# Patient Record
Sex: Female | Born: 1965 | Race: White | Hispanic: No | Marital: Married | Smoking: Never smoker
Health system: Southern US, Community
[De-identification: ages and names within clinical notes are randomized; demographics above are authoritative.]

## PROBLEM LIST (undated history)

## (undated) DIAGNOSIS — G4700 Insomnia, unspecified: Secondary | ICD-10-CM

## (undated) DIAGNOSIS — F909 Attention-deficit hyperactivity disorder, unspecified type: Secondary | ICD-10-CM

## (undated) DIAGNOSIS — M1990 Unspecified osteoarthritis, unspecified site: Secondary | ICD-10-CM

## (undated) DIAGNOSIS — F419 Anxiety disorder, unspecified: Secondary | ICD-10-CM

## (undated) DIAGNOSIS — F329 Major depressive disorder, single episode, unspecified: Secondary | ICD-10-CM

## (undated) DIAGNOSIS — M797 Fibromyalgia: Secondary | ICD-10-CM

## (undated) DIAGNOSIS — K635 Polyp of colon: Secondary | ICD-10-CM

## (undated) DIAGNOSIS — F4310 Post-traumatic stress disorder, unspecified: Secondary | ICD-10-CM

## (undated) DIAGNOSIS — K219 Gastro-esophageal reflux disease without esophagitis: Secondary | ICD-10-CM

## (undated) DIAGNOSIS — D649 Anemia, unspecified: Secondary | ICD-10-CM

## (undated) DIAGNOSIS — R7309 Other abnormal glucose: Secondary | ICD-10-CM

## (undated) DIAGNOSIS — F4321 Adjustment disorder with depressed mood: Secondary | ICD-10-CM

## (undated) HISTORY — DX: Other abnormal glucose: R73.09

## (undated) HISTORY — PX: UPPER GASTROINTESTINAL ENDOSCOPY: SHX188

## (undated) HISTORY — PX: DIAGNOSTIC LAPAROSCOPY: SUR761

## (undated) HISTORY — DX: Insomnia, unspecified: G47.00

## (undated) HISTORY — DX: Fibromyalgia: M79.7

## (undated) HISTORY — DX: Anemia, unspecified: D64.9

## (undated) HISTORY — PX: COLONOSCOPY: SHX174

## (undated) HISTORY — DX: Adjustment disorder with depressed mood: F43.21

## (undated) HISTORY — DX: Major depressive disorder, single episode, unspecified: F32.9

## (undated) HISTORY — PX: TMJ ARTHROPLASTY: SHX1066

## (undated) HISTORY — DX: Post-traumatic stress disorder, unspecified: F43.10

## (undated) HISTORY — DX: Polyp of colon: K63.5

## (undated) HISTORY — DX: Unspecified osteoarthritis, unspecified site: M19.90

## (undated) HISTORY — DX: Gastro-esophageal reflux disease without esophagitis: K21.9

## (undated) SURGERY — MANOMETRY, ESOPHAGUS
Anesthesia: LOCAL

---

## 2000-05-28 HISTORY — PX: ABDOMINAL HYSTERECTOMY: SHX81

## 2003-06-11 ENCOUNTER — Encounter: Admission: RE | Admit: 2003-06-11 | Discharge: 2003-06-11 | Payer: Self-pay | Admitting: Gastroenterology

## 2003-06-11 IMAGING — RF DG UGI W/ HIGH DENSITY W/KUB
15 series · 15 of 15 positions shown · non-contrast
Comparison: none

CLINICAL DATA: Abdominal pain, irritable bowel syndrome.   Previous hysterectomy and endometriosis.  Some nausea.
 UPPER GI, HIGH DENSITY, WITH KUB
 KUB showed considerable fecal material within the right, left, and sigmoid colon. No obstruction or perforation or mass is seen.  
 With the aid of fluoroscopic visualization, barium was seen to pass freely through the esophagus revealing a small hiatal hernia which showed neither obstruction to the passage of a 3 x 13 mm barium tablet nor significant gastroesophageal reflux during this study.
 Stomach appeared normal in size, contour, and mucosal pattern.  There were no ulcers, masses, or obstruction seen.   The pyloric channel, duodenal bulb, and C-loop appear normal. 
 IMPRESSION
 Small sliding hiatal hernia, otherwise normal upper GI series.

[Series 1: run · 1 of 1 slices shown (1 of 15)]
[im 1/1]
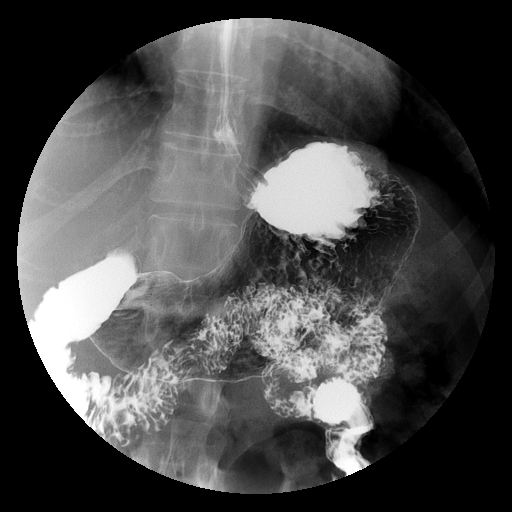

[Series 2: run · 1 of 1 slices shown (2 of 15)]
[im 1/1]
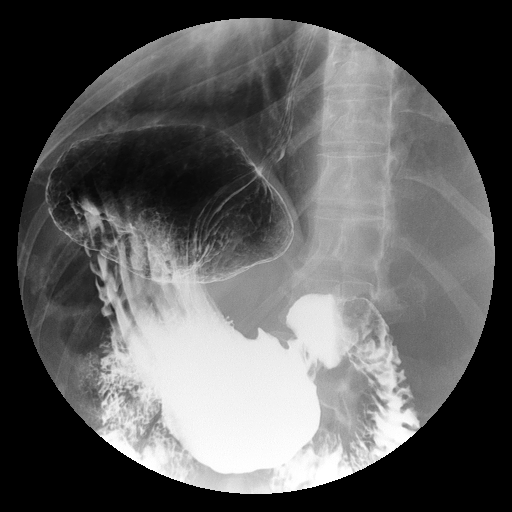

[Series 3: run · 1 of 1 slices shown (3 of 15)]
[im 1/1]
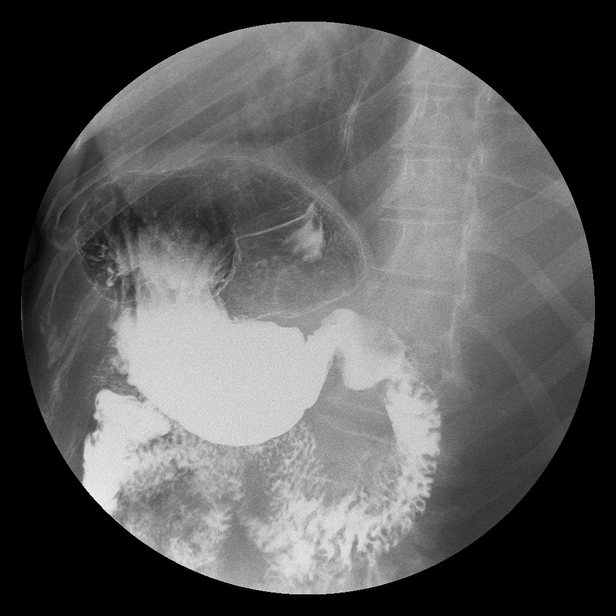

[Series 4: run · 1 of 1 slices shown (4 of 15)]
[im 1/1]
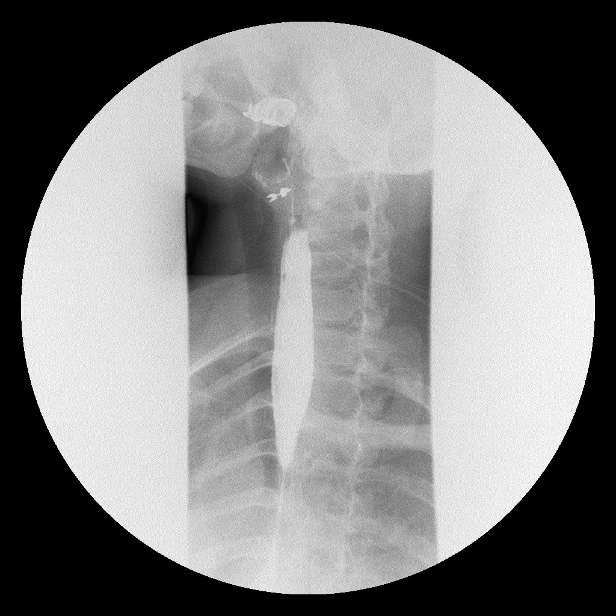

[Series 5: run · 1 of 1 slices shown (5 of 15)]
[im 1/1]
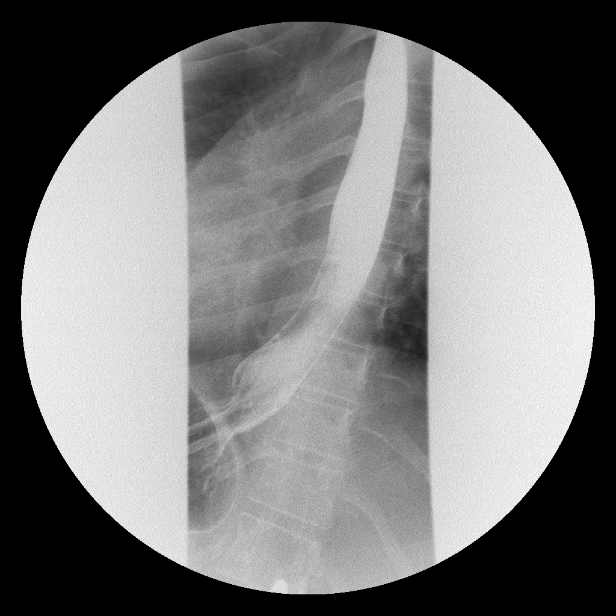

[Series 6: run · 1 of 1 slices shown (6 of 15)]
[im 1/1]
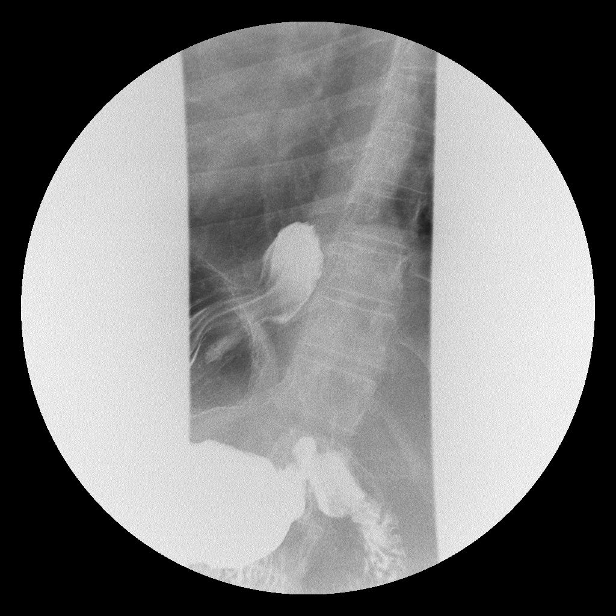

[Series 7: run · 1 of 1 slices shown (7 of 15)]
[im 1/1]
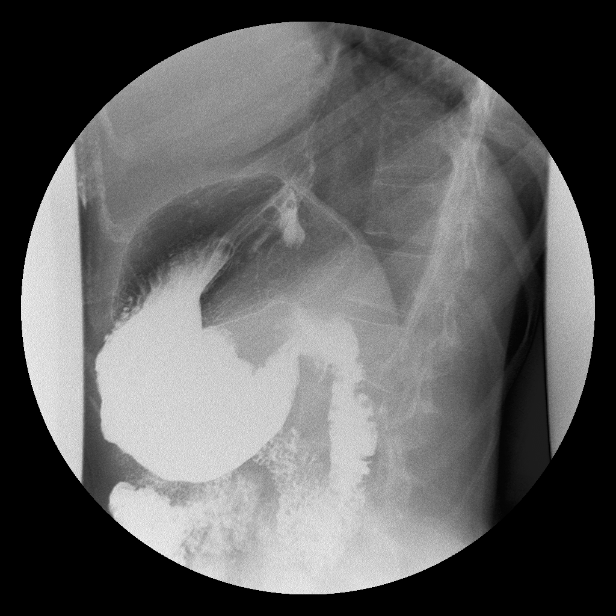

[Series 8: run · 1 of 1 slices shown (8 of 15)]
[im 1/1]
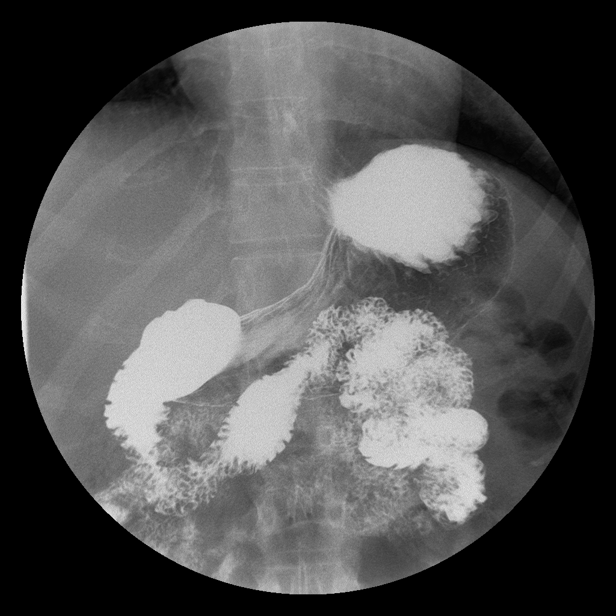

[Series 9: run · 1 of 1 slices shown (9 of 15)]
[im 1/1]
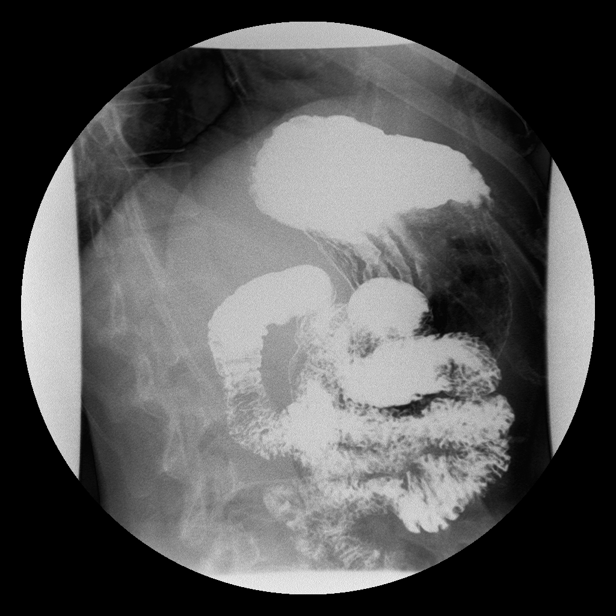

[Series 10: run · 1 of 1 slices shown (10 of 15)]
[im 1/1]
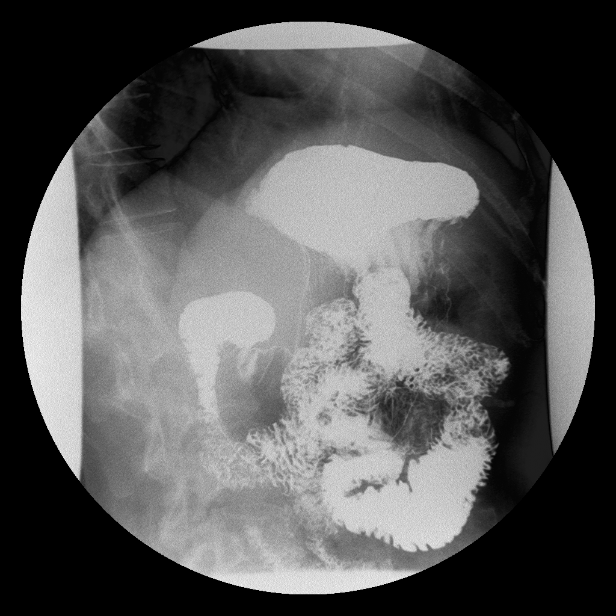

[Series 11: run · 1 of 1 slices shown (11 of 15)]
[im 1/1]
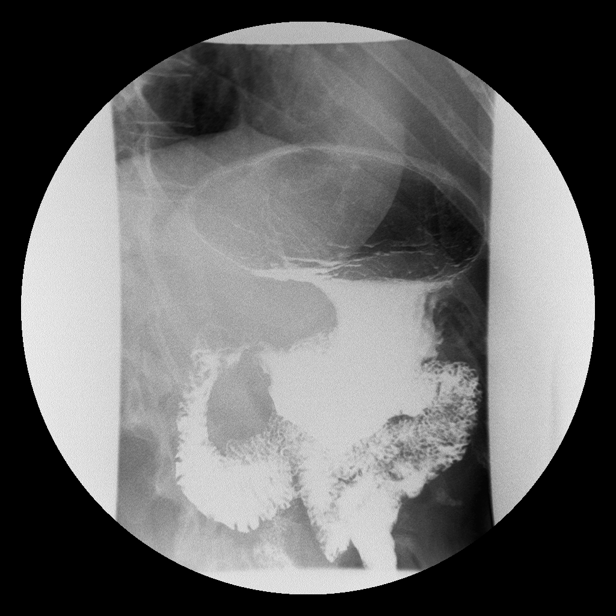

[Series 12: run · 1 of 1 slices shown (12 of 15)]
[im 1/1]
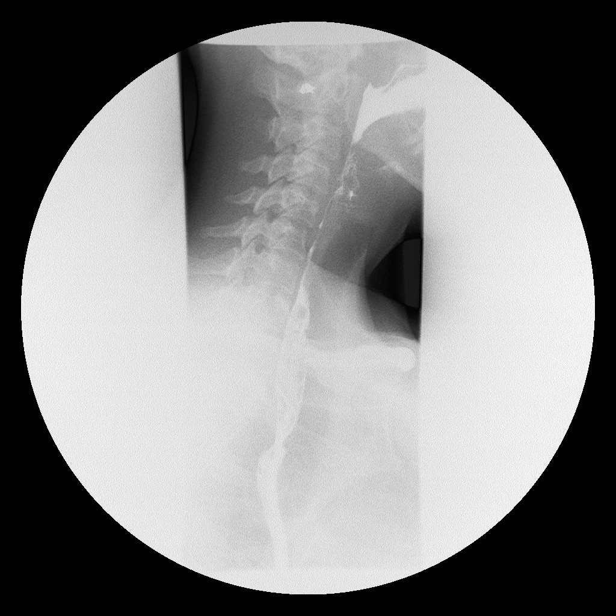

[Series 13: run · 1 of 1 slices shown (13 of 15)]
[im 1/1]
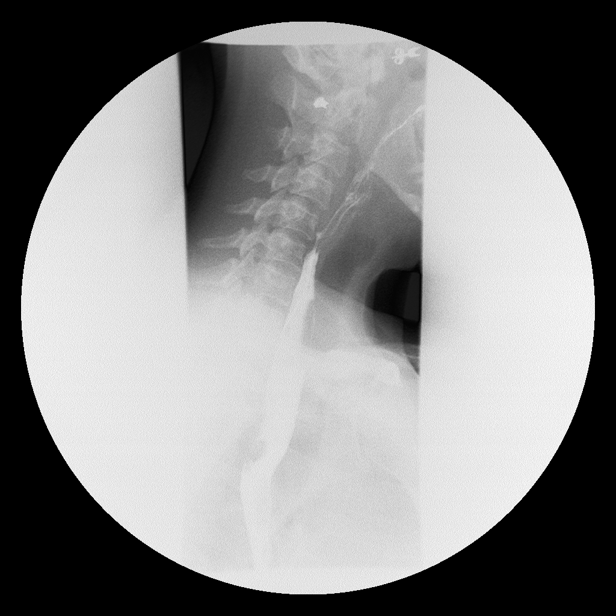

[Series 14: run · 1 of 1 slices shown (14 of 15)]
[im 1/1]
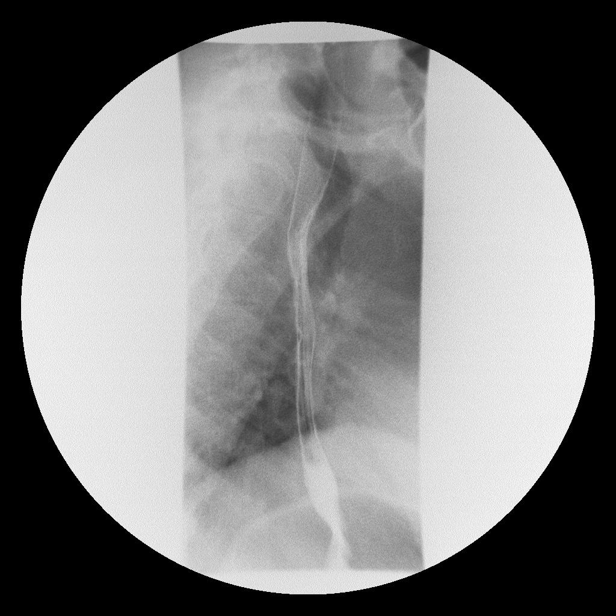

[Series 15: run · 1 of 1 slices shown (15 of 15)]
[im 1/1]
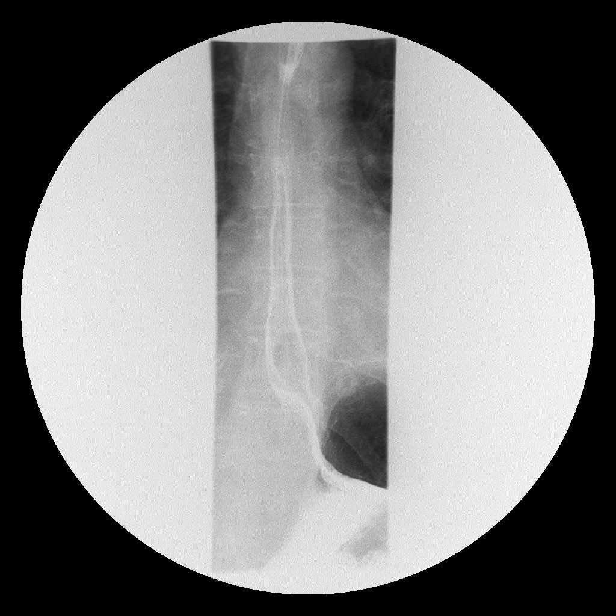

[15 of 15 positions shown; findings below may reference images not displayed]

## 2003-06-15 ENCOUNTER — Encounter: Admission: RE | Admit: 2003-06-15 | Discharge: 2003-06-15 | Payer: Self-pay | Admitting: Gastroenterology

## 2003-06-15 IMAGING — RF DG BE W/ CM - WO/W KUB
14 series · 14 of 14 positions shown · non-contrast
Comparison: none

CLINICAL DATA: The patient has abdominal pain.
 BARIUM ENEMA
 The preliminary KUB reveals retained barium in the colon secondary to previous GI series on 06/11/03.  No obstruction is noted to the retrograde flow of barium with the cecum being well filled without reflux of the distal small bowel.  There is retained feces throughout the colon, particularly in the sigmoid area as well as the right colon.  Polypoid defects however cannot be excluded.  No significant spasm or mucosal abnormalities are noted.  There is tortuosity particularly of the sigmoid colon.
 IMPRESSION
 There is retained feces throughout the colon.  No definite abnormality is noted however.

[Series 1: run · 1 of 1 slices shown (1 of 14)]
[im 1/1]
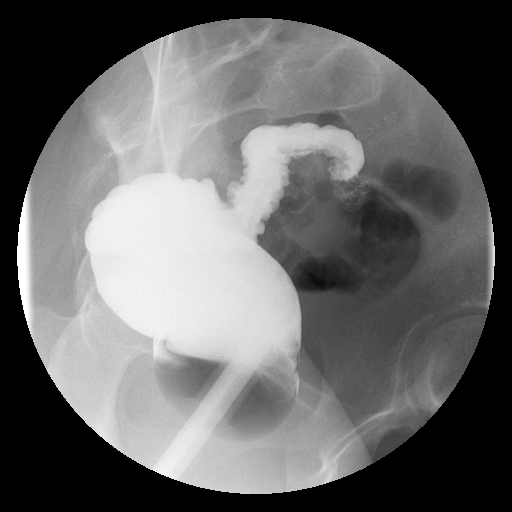

[Series 2: run · 1 of 1 slices shown (2 of 14)]
[im 1/1]
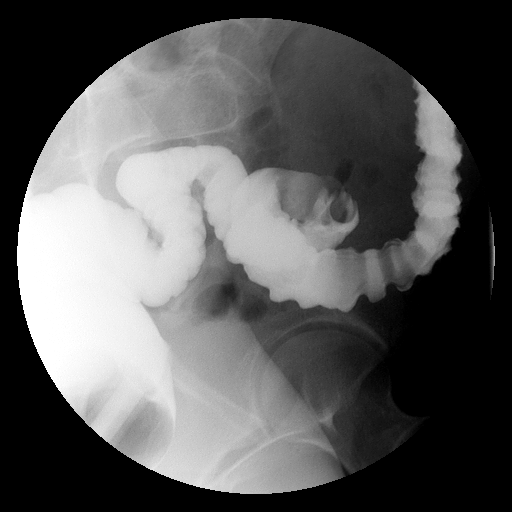

[Series 3: run · 1 of 1 slices shown (3 of 14)]
[im 1/1]
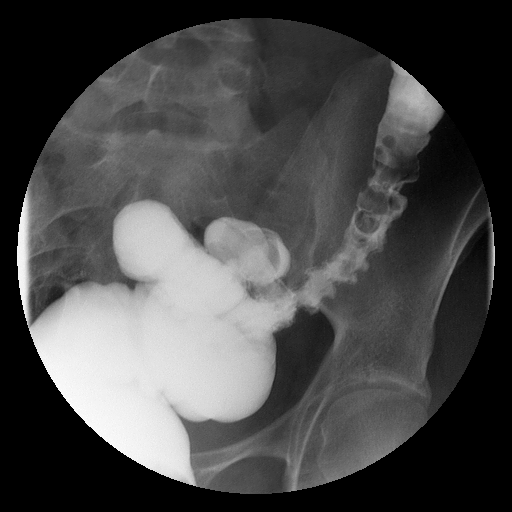

[Series 4: run · 1 of 1 slices shown (4 of 14)]
[im 1/1]
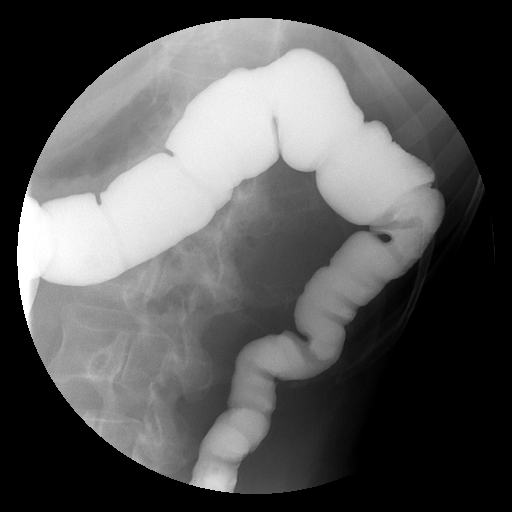

[Series 5: run · 1 of 1 slices shown (5 of 14)]
[im 1/1]
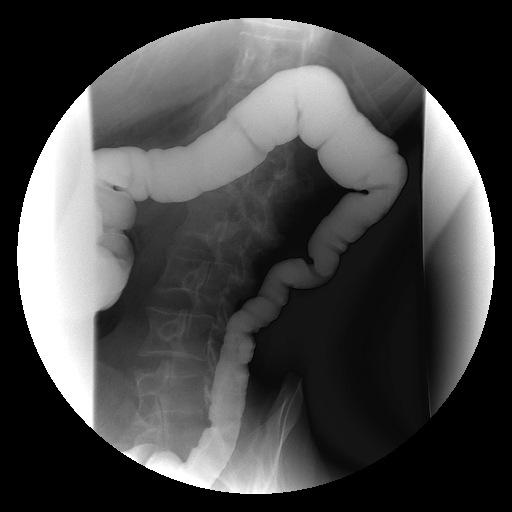

[Series 6: run · 1 of 1 slices shown (6 of 14)]
[im 1/1]
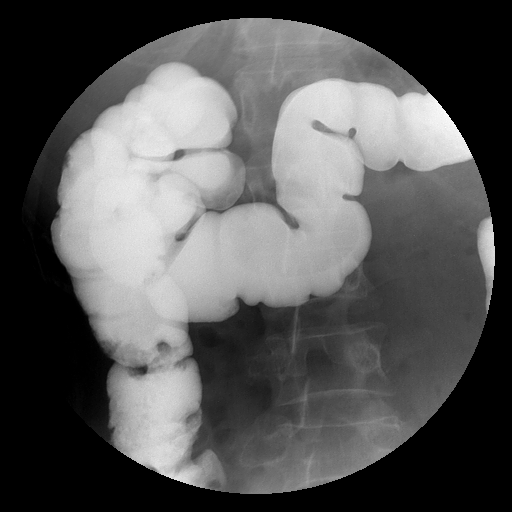

[Series 7: run · 1 of 1 slices shown (7 of 14)]
[im 1/1]
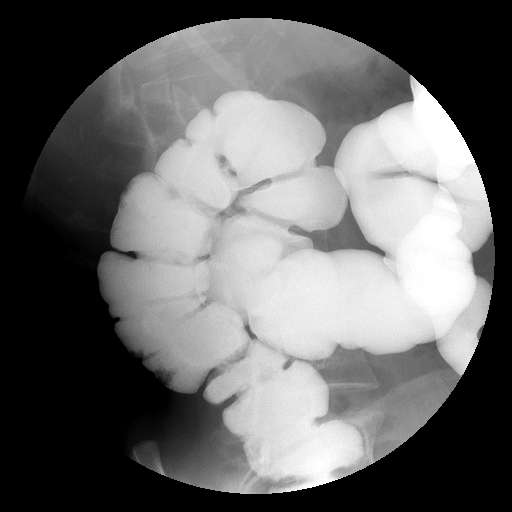

[Series 8: run · 1 of 1 slices shown (8 of 14)]
[im 1/1]
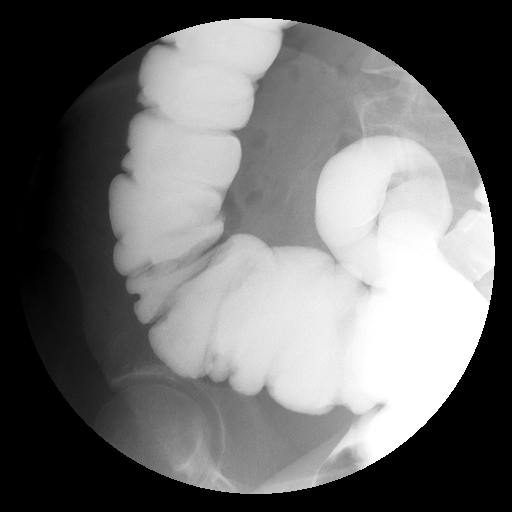

[Series 9: run · 1 of 1 slices shown (9 of 14)]
[im 1/1]
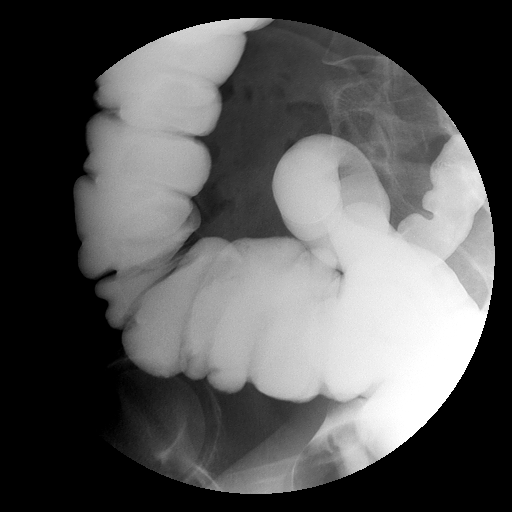

[Series 10: run · 1 of 1 slices shown (10 of 14)]
[im 1/1]
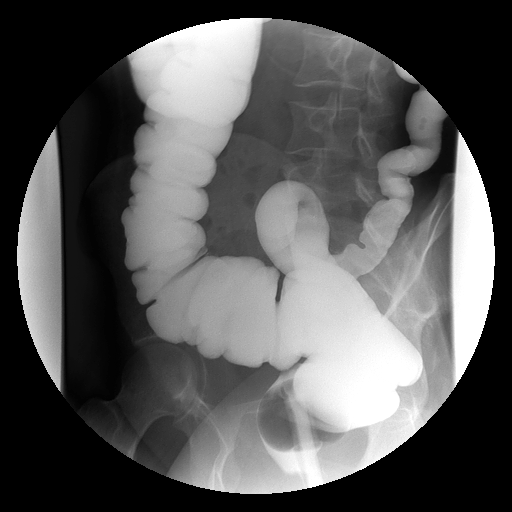

[Series 11: run · 1 of 1 slices shown (11 of 14)]
[im 1/1]
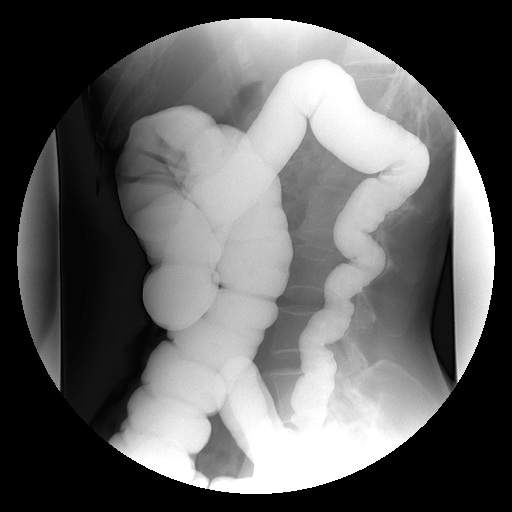

[Series 12: run · 1 of 1 slices shown (12 of 14)]
[im 1/1]
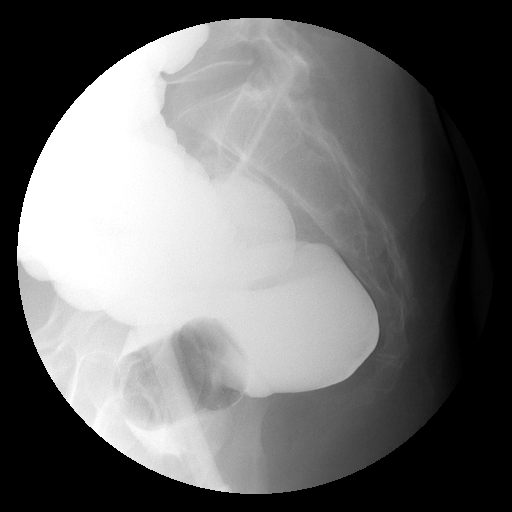

[Series 13: run · 1 of 1 slices shown (13 of 14)]
[im 1/1]
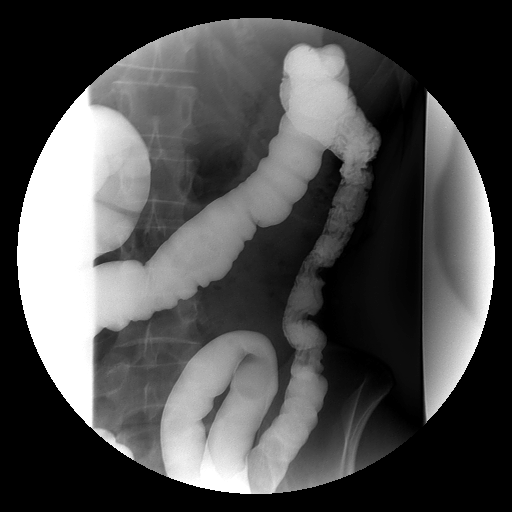

[Series 14: run · 1 of 1 slices shown (14 of 14)]
[im 1/1]
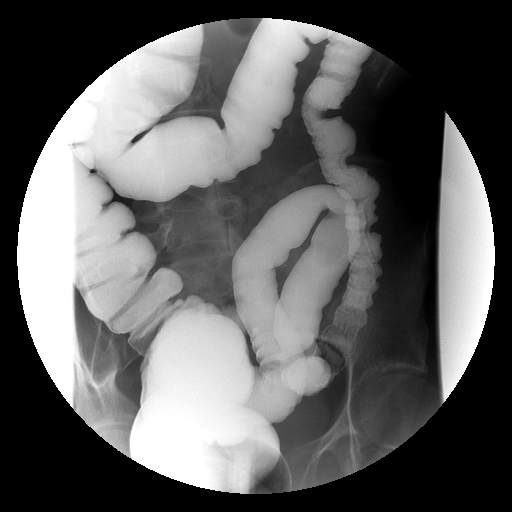

[14 of 14 positions shown; findings below may reference images not displayed]

## 2004-02-14 ENCOUNTER — Encounter: Admission: RE | Admit: 2004-02-14 | Discharge: 2004-05-14 | Payer: Self-pay | Admitting: Anesthesiology

## 2004-02-15 ENCOUNTER — Ambulatory Visit: Payer: Self-pay | Admitting: Anesthesiology

## 2004-03-28 ENCOUNTER — Ambulatory Visit: Payer: Self-pay | Admitting: Anesthesiology

## 2004-05-16 ENCOUNTER — Encounter: Admission: RE | Admit: 2004-05-16 | Discharge: 2004-06-16 | Payer: Self-pay | Admitting: Anesthesiology

## 2004-05-16 ENCOUNTER — Ambulatory Visit: Payer: Self-pay | Admitting: Anesthesiology

## 2004-06-09 ENCOUNTER — Ambulatory Visit: Payer: Self-pay | Admitting: Psychology

## 2004-06-09 ENCOUNTER — Encounter: Admission: RE | Admit: 2004-06-09 | Discharge: 2004-09-07 | Payer: Self-pay

## 2004-06-16 ENCOUNTER — Encounter: Admission: RE | Admit: 2004-06-16 | Discharge: 2004-09-14 | Payer: Self-pay | Admitting: Anesthesiology

## 2004-06-30 ENCOUNTER — Encounter: Admission: RE | Admit: 2004-06-30 | Discharge: 2004-09-28 | Payer: Self-pay | Admitting: Psychology

## 2004-07-17 ENCOUNTER — Ambulatory Visit: Payer: Self-pay | Admitting: Anesthesiology

## 2004-09-29 ENCOUNTER — Ambulatory Visit: Payer: Self-pay | Admitting: Psychology

## 2004-10-02 ENCOUNTER — Encounter: Admission: RE | Admit: 2004-10-02 | Discharge: 2004-12-31 | Payer: Self-pay | Admitting: Anesthesiology

## 2004-10-04 ENCOUNTER — Ambulatory Visit: Payer: Self-pay | Admitting: Physical Medicine and Rehabilitation

## 2004-10-09 ENCOUNTER — Encounter: Admission: RE | Admit: 2004-10-09 | Discharge: 2005-01-07 | Payer: Self-pay | Admitting: Psychology

## 2004-11-09 ENCOUNTER — Ambulatory Visit: Payer: Self-pay | Admitting: Physical Medicine and Rehabilitation

## 2004-11-30 ENCOUNTER — Ambulatory Visit: Payer: Self-pay | Admitting: Physical Medicine and Rehabilitation

## 2004-12-01 ENCOUNTER — Ambulatory Visit: Payer: Self-pay | Admitting: Psychology

## 2004-12-29 ENCOUNTER — Ambulatory Visit: Payer: Self-pay | Admitting: Psychology

## 2005-01-08 ENCOUNTER — Encounter: Admission: RE | Admit: 2005-01-08 | Discharge: 2005-04-26 | Payer: Self-pay | Admitting: Psychology

## 2005-01-26 ENCOUNTER — Ambulatory Visit: Payer: Self-pay | Admitting: Physical Medicine and Rehabilitation

## 2005-01-26 ENCOUNTER — Encounter
Admission: RE | Admit: 2005-01-26 | Discharge: 2005-04-26 | Payer: Self-pay | Admitting: Physical Medicine and Rehabilitation

## 2005-02-12 ENCOUNTER — Ambulatory Visit: Payer: Self-pay | Admitting: Psychology

## 2005-03-02 ENCOUNTER — Ambulatory Visit: Payer: Self-pay | Admitting: Psychology

## 2005-04-06 ENCOUNTER — Ambulatory Visit: Payer: Self-pay | Admitting: Physical Medicine and Rehabilitation

## 2005-04-09 ENCOUNTER — Ambulatory Visit: Payer: Self-pay | Admitting: Psychology

## 2005-05-04 ENCOUNTER — Encounter
Admission: RE | Admit: 2005-05-04 | Discharge: 2005-08-02 | Payer: Self-pay | Admitting: Physical Medicine and Rehabilitation

## 2005-05-06 ENCOUNTER — Encounter
Admission: RE | Admit: 2005-05-06 | Discharge: 2005-08-04 | Payer: Self-pay | Admitting: Physical Medicine and Rehabilitation

## 2005-06-13 ENCOUNTER — Other Ambulatory Visit: Admission: RE | Admit: 2005-06-13 | Discharge: 2005-06-13 | Payer: Self-pay | Admitting: Family Medicine

## 2005-06-15 ENCOUNTER — Ambulatory Visit: Payer: Self-pay | Admitting: Physical Medicine and Rehabilitation

## 2005-08-26 ENCOUNTER — Encounter: Admission: RE | Admit: 2005-08-26 | Discharge: 2005-11-24 | Payer: Self-pay | Admitting: Psychology

## 2005-11-23 ENCOUNTER — Ambulatory Visit: Payer: Self-pay | Admitting: Psychology

## 2006-03-08 ENCOUNTER — Encounter: Admission: RE | Admit: 2006-03-08 | Discharge: 2006-06-06 | Payer: Self-pay | Admitting: Psychology

## 2006-04-09 ENCOUNTER — Ambulatory Visit: Payer: Self-pay | Admitting: Psychology

## 2006-06-25 ENCOUNTER — Encounter: Admission: RE | Admit: 2006-06-25 | Discharge: 2006-09-23 | Payer: Self-pay | Admitting: Psychology

## 2006-06-25 ENCOUNTER — Ambulatory Visit: Payer: Self-pay | Admitting: Psychology

## 2006-08-02 ENCOUNTER — Ambulatory Visit: Payer: Self-pay | Admitting: Psychology

## 2007-07-25 ENCOUNTER — Encounter: Admission: RE | Admit: 2007-07-25 | Discharge: 2007-10-23 | Payer: Self-pay | Admitting: Psychology

## 2007-07-25 ENCOUNTER — Ambulatory Visit: Payer: Self-pay | Admitting: Psychology

## 2007-09-18 ENCOUNTER — Ambulatory Visit: Payer: Self-pay | Admitting: Psychology

## 2008-04-06 ENCOUNTER — Encounter: Payer: Self-pay | Admitting: Family Medicine

## 2008-09-02 ENCOUNTER — Ambulatory Visit: Payer: Self-pay | Admitting: Family Medicine

## 2008-09-02 DIAGNOSIS — F329 Major depressive disorder, single episode, unspecified: Secondary | ICD-10-CM

## 2008-09-02 DIAGNOSIS — F3289 Other specified depressive episodes: Secondary | ICD-10-CM

## 2008-09-02 DIAGNOSIS — G4700 Insomnia, unspecified: Secondary | ICD-10-CM | POA: Insufficient documentation

## 2008-09-02 DIAGNOSIS — G894 Chronic pain syndrome: Secondary | ICD-10-CM | POA: Insufficient documentation

## 2008-09-02 HISTORY — DX: Other specified depressive episodes: F32.89

## 2008-09-02 HISTORY — DX: Major depressive disorder, single episode, unspecified: F32.9

## 2008-09-02 HISTORY — DX: Insomnia, unspecified: G47.00

## 2008-09-27 ENCOUNTER — Telehealth: Payer: Self-pay | Admitting: Family Medicine

## 2008-09-30 ENCOUNTER — Telehealth: Payer: Self-pay | Admitting: Family Medicine

## 2008-10-28 ENCOUNTER — Telehealth: Payer: Self-pay | Admitting: Family Medicine

## 2008-11-25 ENCOUNTER — Telehealth: Payer: Self-pay | Admitting: Family Medicine

## 2008-12-30 ENCOUNTER — Ambulatory Visit: Payer: Self-pay | Admitting: Family Medicine

## 2009-01-25 ENCOUNTER — Telehealth: Payer: Self-pay | Admitting: Family Medicine

## 2009-02-22 ENCOUNTER — Telehealth: Payer: Self-pay | Admitting: Family Medicine

## 2009-02-25 ENCOUNTER — Ambulatory Visit: Payer: Self-pay | Admitting: Family Medicine

## 2009-02-25 LAB — CONVERTED CEMR LAB
ALT: 18 units/L (ref 0–35)
Basophils Relative: 0.5 % (ref 0.0–3.0)
Bilirubin, Direct: 0 mg/dL (ref 0.0–0.3)
Chloride: 110 meq/L (ref 96–112)
Direct LDL: 157.8 mg/dL
Eosinophils Relative: 1.1 % (ref 0.0–5.0)
Glucose, Urine, Semiquant: NEGATIVE
HCT: 36.1 % (ref 36.0–46.0)
Hemoglobin: 12.2 g/dL (ref 12.0–15.0)
Lymphs Abs: 1.3 10*3/uL (ref 0.7–4.0)
MCV: 90 fL (ref 78.0–100.0)
Monocytes Absolute: 0.3 10*3/uL (ref 0.1–1.0)
Potassium: 4.5 meq/L (ref 3.5–5.1)
Protein, U semiquant: NEGATIVE
RBC: 4.01 M/uL (ref 3.87–5.11)
TSH: 0.79 microintl units/mL (ref 0.35–5.50)
Total CHOL/HDL Ratio: 4
Total Protein: 7.2 g/dL (ref 6.0–8.3)
Urobilinogen, UA: 0.2
VLDL: 14 mg/dL (ref 0.0–40.0)
WBC: 4.1 10*3/uL — ABNORMAL LOW (ref 4.5–10.5)

## 2009-02-28 ENCOUNTER — Encounter: Payer: Self-pay | Admitting: Family Medicine

## 2009-03-01 ENCOUNTER — Encounter: Payer: Self-pay | Admitting: Family Medicine

## 2009-03-10 ENCOUNTER — Ambulatory Visit: Payer: Self-pay | Admitting: Family Medicine

## 2009-03-10 DIAGNOSIS — R7309 Other abnormal glucose: Secondary | ICD-10-CM

## 2009-03-10 HISTORY — DX: Other abnormal glucose: R73.09

## 2009-03-28 ENCOUNTER — Telehealth: Payer: Self-pay | Admitting: Family Medicine

## 2009-03-29 ENCOUNTER — Ambulatory Visit: Payer: Self-pay | Admitting: Family Medicine

## 2009-03-29 DIAGNOSIS — J069 Acute upper respiratory infection, unspecified: Secondary | ICD-10-CM | POA: Insufficient documentation

## 2009-04-08 ENCOUNTER — Encounter (INDEPENDENT_AMBULATORY_CARE_PROVIDER_SITE_OTHER): Payer: Self-pay | Admitting: *Deleted

## 2009-04-26 ENCOUNTER — Telehealth: Payer: Self-pay | Admitting: Family Medicine

## 2009-05-23 ENCOUNTER — Telehealth: Payer: Self-pay | Admitting: Family Medicine

## 2009-06-15 ENCOUNTER — Telehealth: Payer: Self-pay | Admitting: Family Medicine

## 2009-06-23 ENCOUNTER — Telehealth: Payer: Self-pay | Admitting: Family Medicine

## 2009-07-15 ENCOUNTER — Ambulatory Visit: Payer: Self-pay | Admitting: Family Medicine

## 2009-07-21 ENCOUNTER — Telehealth: Payer: Self-pay | Admitting: Family Medicine

## 2009-08-17 ENCOUNTER — Telehealth: Payer: Self-pay | Admitting: Family Medicine

## 2009-09-13 ENCOUNTER — Telehealth: Payer: Self-pay | Admitting: Family Medicine

## 2009-09-30 ENCOUNTER — Ambulatory Visit: Payer: Self-pay | Admitting: Family Medicine

## 2009-09-30 DIAGNOSIS — F4321 Adjustment disorder with depressed mood: Secondary | ICD-10-CM

## 2009-09-30 HISTORY — DX: Adjustment disorder with depressed mood: F43.21

## 2009-10-13 ENCOUNTER — Telehealth: Payer: Self-pay | Admitting: Family Medicine

## 2009-11-03 ENCOUNTER — Ambulatory Visit: Payer: Self-pay | Admitting: Family Medicine

## 2009-11-03 ENCOUNTER — Telehealth: Payer: Self-pay | Admitting: Family Medicine

## 2009-12-08 ENCOUNTER — Telehealth: Payer: Self-pay | Admitting: Internal Medicine

## 2009-12-28 ENCOUNTER — Telehealth: Payer: Self-pay | Admitting: Family Medicine

## 2010-01-06 ENCOUNTER — Ambulatory Visit: Payer: Self-pay | Admitting: Family Medicine

## 2010-01-09 ENCOUNTER — Telehealth: Payer: Self-pay | Admitting: Family Medicine

## 2010-02-03 ENCOUNTER — Ambulatory Visit: Payer: Self-pay | Admitting: Family Medicine

## 2010-03-01 ENCOUNTER — Telehealth: Payer: Self-pay | Admitting: Family Medicine

## 2010-03-28 ENCOUNTER — Ambulatory Visit: Payer: Self-pay | Admitting: Family Medicine

## 2010-03-28 DIAGNOSIS — E8941 Symptomatic postprocedural ovarian failure: Secondary | ICD-10-CM | POA: Insufficient documentation

## 2010-03-29 ENCOUNTER — Telehealth: Payer: Self-pay | Admitting: Family Medicine

## 2010-04-26 ENCOUNTER — Telehealth: Payer: Self-pay | Admitting: Family Medicine

## 2010-05-17 ENCOUNTER — Ambulatory Visit: Payer: Self-pay | Admitting: Family Medicine

## 2010-05-30 ENCOUNTER — Telehealth: Payer: Self-pay | Admitting: Family Medicine

## 2010-06-12 ENCOUNTER — Encounter: Payer: Self-pay | Admitting: Family Medicine

## 2010-06-14 ENCOUNTER — Encounter: Payer: Self-pay | Admitting: Family Medicine

## 2010-06-14 ENCOUNTER — Encounter
Admission: RE | Admit: 2010-06-14 | Discharge: 2010-06-14 | Payer: Self-pay | Source: Home / Self Care | Attending: Family Medicine | Admitting: Family Medicine

## 2010-06-18 ENCOUNTER — Encounter: Payer: Self-pay | Admitting: Family Medicine

## 2010-06-27 ENCOUNTER — Encounter: Payer: Self-pay | Admitting: Family Medicine

## 2010-06-27 NOTE — Assessment & Plan Note (Signed)
Summary: med check/follow up/cjr   Vital Signs:  Patient profile:   45 year old female Menstrual status:  hysterectomy Weight:      144 pounds Temp:     98.9 degrees F oral BP sitting:   120 / 80  (left arm) Cuff size:   regular  Vitals Entered By: [name] LPN (July 15, 2009 8:37 AM) CC: 3 month med check, lost all meds 5 days ago, Depression CBG Result 114   History of Present Illness: Followup for the following items.  Prediabetes. Home blood sugars frequent fasting around 140. Possibly some mild urine frequency. No fevers. No weight loss and in fact has gained a few pounds his last visit. Poor dietary compliance. Positive family history diabetes though not in any first degree relatives. Has been eating a lot of starchy foods.  History of depression. On Zoloft 100 mg b.i.d. Added Abilify several months ago. Initially had good improvement but feels more anxious and depressed at times now. Would like to consider titration of dosage. Poor sleep frequently. No suicidal ideation.  Chronic pain syndrome stable. Fibromyalgia. Has not tolerated many medications in the past. Takes oxycodone regularly and supplements with tramadol.  Depression History:      Positive alarm features for depression include insomnia and fatigue (loss of energy).  However, she denies significant weight loss, significant weight gain, and recurrent thoughts of death or suicide.        Allergies (verified): No Known Drug Allergies  Past History:  Past Medical History: Last updated: 09/02/2008 fibromyalgia colon polyps chronic pain syndrome Depression chronic insomnia  Past Surgical History: Last updated: 09/02/2008 Hysterectomy (TAH) TMJ surg X 2 Laparoscopy X 4 (1993, 1610,9604, 2000)  Social History: Last updated: 09/02/2008 Occupation: transportation logistics Married Never Smoked PMH-FH-SH reviewed for relevance  Review of Systems  The patient denies anorexia, fever, weight  loss, vision loss, chest pain, syncope, dyspnea on exertion, peripheral edema, prolonged cough, headaches, abdominal pain, and muscle weakness.    Physical Exam  General:  Well-developed,well-nourished,in no acute distress; alert,appropriate and cooperative throughout examination Ears:  External ear exam shows no significant lesions or deformities.  Otoscopic examination reveals clear canals, tympanic membranes are intact bilaterally without bulging, retraction, inflammation or discharge. Hearing is grossly normal bilaterally. Mouth:  Oral mucosa and oropharynx without lesions or exudates.  Teeth in good repair. Neck:  No deformities, masses, or tenderness noted. Lungs:  Normal respiratory effort, chest expands symmetrically. Lungs are clear to auscultation, no crackles or wheezes. Heart:  Normal rate and regular rhythm. S1 and S2 normal without gallop, murmur, click, rub or other extra sounds. Extremities:  No clubbing, cyanosis, edema, or deformity noted with normal full range of motion of all joints.   Neurologic:  alert & oriented X3, cranial nerves II-XII intact, and strength normal in all extremities.   Psych:  normally interactive, good eye contact, not anxious appearing, and not depressed appearing.     Impression & Recommendations:  Problem # 1:  PREDIABETES (ICD-790.29) Assessment Unchanged counseled regarding diabetes prevention.  Work on weight loss.  Problem # 2:  DEPRESSION (ICD-311) titrate Abilify to 5 mg by mouth once daily  Her updated medication list for this problem includes:    Zoloft 100 Mg Tabs (Sertraline hcl) ..... One by mouth bid  Problem # 3:  CHRONIC PAIN SYNDROME (ICD-338.4) Assessment: Unchanged  Complete Medication List: 1)  Zoloft 100 Mg Tabs (Sertraline hcl) .... One by mouth bid 2)  Ambien 10 Mg Tabs (Zolpidem  tartrate) .... 1/2-1/po q hs 3)  Oxycodone-acetaminophen 10-650 Mg Tabs (Oxycodone-acetaminophen) .... One by mouth qid as needed 4)   Tramadol Hcl 50 Mg Tabs (Tramadol hcl) .Marland Kitchen.. 1-2 tabs as needed 5)  Abilify 5 Mg Tabs (Aripiprazole) .... One by mouth once daily  Other Orders: Capillary Blood Glucose/CBG (66440)   Patient Instructions: 1)  Please schedule a follow-up appointment in 6 months .  2)  It is important that you exercise reguarly at least 20 minutes 5 times a week. If you develop chest pain, have severe difficulty breathing, or feel very tired, stop exercising immediately and seek medical attention.  3)  You need to lose weight. Consider a lower calorie diet and regular exercise.  4)  Check your blood sugars regularly. If your readings are usually above:140or below 70 you should contact our office.  Prescriptions: TRAMADOL HCL 50 MG TABS (TRAMADOL HCL) 1-2 tabs as needed  #120 x 1   Entered and Authorized by:   [name] MD   Signed by:   [name] MD on 07/15/2009   Method used:   [facility]  [address] [phone][facility] (retail)       [address] [place], [place]  [address]       Ph: [phone] or [phone]       Fax: [phone]   RxID:   [id number] ABILIFY 5 MG TABS (ARIPIPRAZOLE) one by mouth once daily  #30 x 5   Entered and Authorized by:   [name] MD   Signed by:   [name] MD on 07/15/2009   Method used:   [facility]        [facility]  [address] [phone][facility] (retail[facility]       [address] [place], [place]  [address]       Ph: [phone] or [phone]       Fax: [phone]   RxID:   [id number]

## 2010-06-27 NOTE — Miscellaneous (Signed)
Summary: Controlled Substances Contract  Controlled Substances Contract   Imported By: Maryln Gottron 03/30/2010 09:18:37  _____________________________________________________________________  External Attachment:    Type:   Image     Comment:   External Document

## 2010-06-27 NOTE — Progress Notes (Signed)
Summary: change of meds  Phone Note Call from Patient   Caller: Patient Call For: [name] MD Summary of Call: Lorazepam is making pt too tired, and making her feel worse.  Wants a different RX. [phone] [facility])  Initial call taken by: Lynann Beaver CMA,  January 09, 2010 12:39 PM  Follow-up for Phone Call        my suggestion is to leave off rather than add another med .  We already titrated her Abilify last visit and would avoid any other new meds. Other option is to reduce lorazepam to one half tablet/dose. Follow-up by: [name] MD,  January 09, 2010 1:07 PM  Additional Follow-up for Phone Call Additional follow up Details #1::        Notified pt. Additional Follow-up by: Lynann Beaver CMA,  January 09, 2010 1:17 PM

## 2010-06-27 NOTE — Progress Notes (Signed)
Summary:  Oxycodone refill request, last filled 4/19  Phone Note Refill Request Call back at Home Phone [phone] Message from:  Patient---live call  Refills Requested: Medication #1:  OXYCODONE-ACETAMINOPHEN 10-650 MG TABS one by mouth qid as needed call pt when ready.  Last filled 4/19  Initial call taken by: [name],  Oct 13, 2009 11:02 AM  Follow-up for Phone Call        will refill Follow-up by: [name] MD,  Oct 13, 2009 1:05 PM  Additional Follow-up for Phone Call Additional follow up Details #1::        Pt informed Additional Follow-up by: [name] LPN,  Oct 13, 2009 1:15 PM    Prescriptions: OXYCODONE-ACETAMINOPHEN 10-650 MG TABS (OXYCODONE-ACETAMINOPHEN) one by mouth qid as needed  #120 x 0   Entered and Authorized by:   [name] MD   Signed by:   [name] MD on 10/13/2009   Method used:   Print then Give to Patient   RxID:   [id number]

## 2010-06-27 NOTE — Progress Notes (Signed)
Summary: Address for Oxycodone refill request    Caller: Patient Call For: [name] MD Summary of Call: Address to mail Oxycodone refill when pt calls (out of town) [name] Saturday [address] [place], [place]  [address] Initial call taken by: [name] LPN,  November 03, 8313 9:29 AM  Follow-up for Phone Call        pt cb please mail rx  Follow-up by: [name],  November 07, 2009 4:06 PM  Additional Follow-up for Phone Call Additional follow up Details #1::        Pt called and said that she called on 11/07/09 to tell Dr. [name] to go ahead and mail script. Pt called to check on status.    Additional Follow-up by: [name],  November 10, 2009 1:38 PM    Additional Follow-up for Phone Call Additional follow up Details #2::    pt may have husband to pick script. call when ready.  Last filled 5/19 [name] LPN  November 10, 2009 2:34 PM      Follow-up by: [name],  November 10, 2009 2:13 PM  Additional Follow-up for Phone Call Additional follow up Details #3:: Details for Additional Follow-up Action Taken: can you please date it to be filled on the 19th, but husband needs to pick up today. going out of town. We can refill today.  Cannot "predate" but will refill 2 days early since she is out of town. [name] MD  November 11, 2009 8:13 AM  Additional Follow-up by: [name],  November 10, 2009 2:38 PM  Prescriptions: OXYCODONE-ACETAMINOPHEN 10-650 MG TABS (OXYCODONE-ACETAMINOPHEN) one by mouth qid as needed  #120 x 0   Entered and Authorized by:   [name] MD   Signed by:   [name] MD on 11/11/2009   Method used:   Print then Give to Patient   RxID:   [id number]  Pt informed Rx ready for pick-up.  Pt gave verbal permission for her husmband to pick-up as she is in [place].

## 2010-06-27 NOTE — Progress Notes (Signed)
Summary: Zolpidem refill request  Phone Note From Pharmacy   [facility]  [address] [phone]* Call For: [place]  Summary of Call[facility], take 1 to [facility]/2 at [facility], last filled 09-30-09 Initial call taken by: [name] LPN,  December 28, 2009 3:30 PM  Follow-up for Phone Call        OK to refill for 3 months. Follow-up by: [name] MD,  December 28, 2009 5:15 PM    Prescriptions: AMBIEN 10 MG TABS (ZOLPIDEM TARTRATE) 1/2-1/po q hs  #30 x 3   Entered by:   [name] LPN   Authorized by:   [name] MD   Signed by:   [name] LPN on 96/08/5407   Method used:   Telephoned to ...       [facility]  [address] [phone][facility] ([facility]ail)       [address]       [place], [place]  [address]       Ph: [phone] or [phone]       Fax: [phone]   RxID:   [id number]

## 2010-06-27 NOTE — Assessment & Plan Note (Signed)
Summary: due in family/njr   Vital Signs:  Patient profile:   45 year old female Menstrual status:  hysterectomy Weight:      137 pounds Temp:     99.2 degrees F oral BP sitting:   130 / 88  (left arm) Cuff size:   regular  Vitals Entered By: [name] LPN (Oct 01, 8467 10:11 AM) CC: father passed 4/31   History of Present Illness: Patient is here with grief reaction after her father passed away this past 2022/11/06. [place].  Father had chronic lung condition. Patient was very close to him. She's had extreme difficulty coping this past week. Poor sleep. She has run out of Ambien. She has long history of depression treated with Zoloft and Abilify but she ran out of Abilify recently. She denies suicidal ideation. She had great difficulty getting back into her routine. Frequent crying spells. Poor appetite.  Allergies (verified): No Known Drug Allergies  Past History:  Past Medical History: Last updated: 09/02/2008 fibromyalgia colon polyps chronic pain syndrome Depression chronic insomnia  Review of Systems      See HPI  Physical Exam  General:  patient crying during the interview poor eye contact but alert and cooperative. Lungs:  Normal respiratory effort, chest expands symmetrically. Lungs are clear to auscultation, no crackles or wheezes. Heart:  Normal rate and regular rhythm. S1 and S2 normal without gallop, murmur, click, rub or other extra sounds. Psych:  withdrawn, poor eye contact, tearful, and moderately anxious.     Impression & Recommendations:  Problem # 1:  GRIEF REACTION (ICD-309.0) we have strongly advised counseling particularly given her history of depression. We have set up for her to see Judithe Modest at 1 PM today. We'll refill her Ambien for p.r.n. use  Complete Medication List: 1)  Zoloft 100 Mg Tabs (Sertraline hcl) .... One by mouth bid 2)  Ambien 10 Mg Tabs (Zolpidem tartrate) .... 1/2-1/po q hs 3)   Oxycodone-acetaminophen 10-650 Mg Tabs (Oxycodone-acetaminophen) .... One by mouth qid as needed 4)  Tramadol Hcl 50 Mg Tabs (Tramadol hcl) .Marland Kitchen.. 1-2 tabs as needed 5)  Abilify 5 Mg Tabs (Aripiprazole) .... One by mouth once daily  Patient Instructions: 1)  See Judithe Modest at 1 pm today to initiate counseling. Prescriptions: AMBIEN 10 MG TABS (ZOLPIDEM TARTRATE) 1/2-1/po q hs  #30 x 1   Entered and Authorized by:   [name] MD   Signed by:   [name] MD on 09/30/2009   Method used:   Print then Give to Patient   RxID:   [id number]

## 2010-06-27 NOTE — Progress Notes (Signed)
Summary: Pt req script for Oxycodone, to pick-up Friday [facility]  Phone Note Refill Request Call back at Home Phone [phone] Message from:  Patient on March 01, 2010 2:42 PM  Refills Requested: Medication #1:  OXYCODONE-ACETAMINOPHEN 10-650 MG TABS one by mouth qid as needed   Dosage confirmed as above?Dosage Confirmed   Supply Requested: 1 month For pick up Friday afternoon 03/03/10.    Method Requested: Pick up at Office Initial call taken by: [name],  March 01, 2010 2:43 PM  Follow-up for Phone Call        last filled 02-03-10, #120 send to Dr Kirtland Bouchard on Friday  Additional Follow-up for Phone Call Additional follow up Details #1::        Pt called back to check on status of Oxycodone. Pt would like to pick this script up today asap.    Pls call when ready for pick up.  Additional Follow-up by: [name],  March 03, 2010 10:46 AM    Additional Follow-up for Phone Call Additional follow up Details #2::    done Follow-up by: Nelwyn Salisbury MD,  March 03, 2010 1:21 PM  Additional Follow-up for Phone Call Additional follow up Details #3:: Details for Additional Follow-up Action Taken: pt aware.  Additional Follow-up by: Pura Spice, RN,  March 03, 2010 1:56 PM  Prescriptions: OXYCODONE-ACETAMINOPHEN 10-650 MG TABS (OXYCODONE-ACETAMINOPHEN) one by mouth qid as needed  #120 x 0   Entered and Authorized by:   Nelwyn Salisbury MD   Signed by:   Nelwyn Salisbury MD on 03/03/2010   Method used:   Print then Give to Patient   RxID:   [id number]

## 2010-06-27 NOTE — Assessment & Plan Note (Signed)
Summary: fu on depression/njr   Vital Signs:  Patient profile:   45 year old female Menstrual status:  hysterectomy Weight:      146 pounds Temp:     98.7 degrees F oral BP sitting:   102 / 70  (left arm) Cuff size:   regular  Vitals Entered By: Duard Brady LPN (January 06, 2010 10:01 AM) CC: f/u on depression - c/o increased anxiety, Depressive symptoms Is Patient Diabetic? No   History of Present Illness:  Depressive symptoms      This is a 45 year old woman who presents with Depressive symptoms.  The patient reports depressed mood, loss of interest/pleasure, and insomnia, but denies significant weight loss and significant weight gain.  The patient also reports fatigue or loss of energy, diminished concentration, and thoughts of death.  The patient denies suicidal intent and suicidal plans.  The patient reports the following psychosocial stressors: recent death of a loved one.  The patient denies abnormally elevated mood, decreased need for sleep, increased talkativeness, flight of ideas, increased goal-directed activity, and inflated self-esteem/ grandiosity.    Long hx of depression.  On Zoloft max dose and prior agitation with Wellbutrin. Initially better with Abilify but not seeing much benefit past few weeks.  Compliant with meds. Abilify dose currently 5 mg.  Hx chronic pain syndrome treated with Oxycodone and fair pain control.  No side effects from medication.  Needs refills today. Preparing to leave out of state today for one week.  Pt also complaining of difficulty falling asleep.  Feels quite anxious at times.  Ambien does not seem to be consistently helping.  Preventive Screening-Counseling & Management  Alcohol-Tobacco     Smoking Status: never  Allergies (verified): No Known Drug Allergies  Past History:  Past Medical History: Last updated: 09/02/2008 fibromyalgia colon polyps chronic pain syndrome Depression chronic insomnia  Past Surgical  History: Last updated: 09/02/2008 Hysterectomy (TAH) TMJ surg X 2 Laparoscopy X 4 (1993, 1610,9604, 2000)  Family History: Last updated: 09/02/2008 Rheumatoid arthritis mother Family History Depression  Social History: Last updated: 11/03/2009 Occupation: transportation logistics Married Never Smoked Alcohol use-no  Risk Factors: Smoking Status: never (01/06/2010) PMH-FH-SH reviewed for relevance  Review of Systems  The patient denies anorexia, fever, weight loss, weight gain, chest pain, syncope, dyspnea on exertion, peripheral edema, prolonged cough, headaches, hemoptysis, abdominal pain, melena, hematochezia, and severe indigestion/heartburn.    Physical Exam  General:  Well-developed,well-nourished,in no acute distress; alert,appropriate and cooperative throughout examination Neck:  No deformities, masses, or tenderness noted. Lungs:  Normal respiratory effort, chest expands symmetrically. Lungs are clear to auscultation, no crackles or wheezes. Heart:  Normal rate and regular rhythm. S1 and S2 normal without gallop, murmur, click, rub or other extra sounds. Neurologic:  alert & oriented X3 and cranial nerves II-XII intact.   Psych:  normally interactive, good eye contact, and slightly anxious.     Impression & Recommendations:  Problem # 1:  DEPRESSION (ICD-311) Assessment Deteriorated titrate Abilify to 10mg  daily.  Consider referral to psychiatrist if still not controlled on this dose. Her updated medication list for this problem includes:    Zoloft 100 Mg Tabs (Sertraline hcl) ..... One by mouth bid    Lorazepam 0.5 Mg Tabs (Lorazepam) ..... One by mouth q 8 hours as needed severe anxiety  Problem # 2:  CHRONIC PAIN SYNDROME (ICD-338.4) refilled Oxycodone.  Problem # 3:  INSOMNIA, CHRONIC (ICD-307.42)  Complete Medication List: 1)  Zoloft 100 Mg Tabs (Sertraline hcl) .Marland KitchenMarland KitchenMarland Kitchen  One by mouth bid 2)  Ambien 10 Mg Tabs (Zolpidem tartrate) .... 1/2-1/po q hs 3)   Oxycodone-acetaminophen 10-650 Mg Tabs (Oxycodone-acetaminophen) .... One by mouth qid as needed 4)  Abilify 10 Mg Tabs (Aripiprazole) .... One by mouth once daily 5)  Lorazepam 0.5 Mg Tabs (Lorazepam) .... One by mouth q 8 hours as needed severe anxiety  Patient Instructions: 1)  Please schedule a follow-up appointment in 1 month.  Prescriptions: ABILIFY 10 MG TABS (ARIPIPRAZOLE) one by mouth once daily  #30 x 5   Entered and Authorized by:   [name] MD   Signed by:   [name] MD on 01/06/2010   Method used:   [facility]        [facility]  [address] [phone]* (retail)       [address] [place], [place]  [address]       Ph: [phone] or [phone]       Fax: [phone]   RxID:   [id number] LORAZEPAM 0.5 MG TABS (LORAZEPAM) one by mouth q 8 hours as needed severe anxiety  #30 x 0   Entered and Authorized by:   [name] MD   Signed by:   [name] MD on 01/06/2010   Method used:   Print then Give to Patient   RxID:   [id number] OXYCODONE-ACETAMINOPHEN 10-650 MG TABS (OXYCODONE-ACETAMINOPHEN) one by mouth qid as needed  #120 x 0   Entered and Authorized by:   [name] MD   Signed by:   [name] MD on 01/06/2010   Method used:   Print then Give to Patient   RxID:   [id number]

## 2010-06-27 NOTE — Assessment & Plan Note (Signed)
Summary: FOLLOW UP ON MEDS/CJR   Vital Signs:  Patient profile:   45 year old female Menstrual status:  hysterectomy Weight:      143 pounds Temp:     98.6 degrees F oral  Vitals Entered By: [name] LPN (November 03, 1608 8:25 AM)  CC: Follow-up on meds   History of Present Illness: Dealing with emotional issues of loss of Father one and one half months ago. Overall greatly improved.  Long hx of depression on Zoloft and Abilify. We referred for counseling but pt didn't go sec to cost. Sleep poor without Ambien.  Pain poorly controlled at times.  Hx chronic pain syndrome/fibromyalgia and came to me on opioids. We have explored and tried many alternatives in past without success-Tricyclics, Lyrica, Cymbalta, PT, massage therapy, Ultram, NSAIDS.  REmains on Oxycodone with no side effects.  Pain is diffuse and mostly upper back, upper extremities, neck.  Chronic insomnia.  Using Ambien with good relief.   Allergies (verified): No Known Drug Allergies  Past History:  Past Medical History: Last updated: 09/02/2008 fibromyalgia colon polyps chronic pain syndrome Depression chronic insomnia  Family History: Last updated: 09/02/2008 Rheumatoid arthritis mother Family History Depression  Social History: Last updated: 11/03/2009 Occupation: Dealer Married Never Smoked Alcohol use-no PMH-FH-SH reviewed for relevance  Social History: Occupation: Dealer Married Never Smoked Alcohol use-no  Review of Systems  The patient denies anorexia, fever, weight loss, chest pain, headaches, abdominal pain, and muscle weakness.    Physical Exam  General:  Well-developed,well-nourished,in no acute distress; alert,appropriate and cooperative throughout examination Neck:  No deformities, masses, or tenderness noted. Lungs:  Normal respiratory effort, chest expands symmetrically. Lungs are clear to auscultation, no crackles or wheezes. Heart:   Normal rate and regular rhythm. S1 and S2 normal without gallop, murmur, click, rub or other extra sounds. Msk:  multiple tender trigger points c/w fibromayalgia. Extremities:  no edema or clubbing. Psych:  normally interactive, good eye contact, and not anxious appearing.     Impression & Recommendations:  Problem # 1:  GRIEF REACTION (ICD-309.0) Assessment Improved discussed coping mechanisms.  Problem # 2:  CHRONIC PAIN SYNDROME (ICD-338.4) Assessment: Unchanged  Problem # 3:  INSOMNIA, CHRONIC (ICD-307.42) continues on Ambien.  Complete Medication List: 1)  Zoloft 100 Mg Tabs (Sertraline hcl) .... One by mouth bid 2)  Ambien 10 Mg Tabs (Zolpidem tartrate) .... 1/2-1/po q hs 3)  Oxycodone-acetaminophen 10-650 Mg Tabs (Oxycodone-acetaminophen) .... One by mouth qid as needed 4)  Abilify 5 Mg Tabs (Aripiprazole) .... One by mouth once daily  Patient Instructions: 1)  Please schedule a follow-up appointment in 4 months .  2)  Schedule your mammogram.  Prescriptions: ZOLOFT 100 MG TABS (SERTRALINE HCL) one by mouth bid  #60 x 5   Entered and Authorized by:   [name] MD   Signed by:   [name] MD on 11/03/2009   Method used:   [facility]  [address] [phone][facility]       [address] [place], [place]  [address]       Ph: [phone] or [phone]       Fax: [phone]   RxID:   [id number]
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
## 2010-06-27 NOTE — Progress Notes (Signed)
Summary: REQ FOR REFILL RX (OXYCODONE)  Phone Note Call from Patient   Caller: Patient  [phone] Reason for Call: Talk to Nurse, Talk to Doctor Summary of Call: Pt called to req that her Rx's for meds (OXYCODONE-ACETAMINOPHEN 10-650 MG TABS) be prepared for her to p/u early... Pt is going out of town because her Dad is sick (end stage lung disease) and has been admitted to the hospital...Marland KitchenMarland KitchenMarland Kitchen Pt can be reached at [phone] with any questions or concerns.  Initial call taken by: Debbra Riding,  September 13, 2009 10:21 AM  Follow-up for Phone Call        Pt called to ck on status of request... Pt was adv that req has been sent to physician for approval.  Follow-up by: Debbra Riding,  September 13, 2009 12:20 PM  Additional Follow-up for Phone Call Additional follow up Details #1::        can refill early this time but next refill should be > than 19th of month to account for early RF this month (ie next refill due 10-19-09). Additional Follow-up by: [name] MD,  September 13, 2009 1:00 PM    Additional Follow-up for Phone Call Additional follow up Details #2::    Pt informed ready for pick-up, also informed of 5/25 next refill per Dr [name] Follow-up by: [name] LPN,  September 13, 2009 1:04 PM  Prescriptions: OXYCODONE-ACETAMINOPHEN 10-650 MG TABS (OXYCODONE-ACETAMINOPHEN) one by mouth qid as needed  #120 x 0   Entered and Authorized by:   [name] MD   Signed by:   [name] MD on 09/13/2009   Method used:   Print then Give to Patient   RxID:   [id number]

## 2010-06-27 NOTE — Progress Notes (Signed)
Summary: Pt req script for Oxycodone 10-650mg   Phone Note Call from Patient Call back at Home Phone [phone]   Caller: Patient Call For: Toni Peat MD Summary of Call: Pt req script for Oxycodone 10-650mg . Please notify pt when ready for pick up.  Initial call taken by: [name],  July 21, 2009 8:44 AM  Follow-up for Phone Call        Last filled 1/27 #120, 0 RF OV 1/27 pt reported loosing all her medications. Follow-up by: [name] LPN,  July 21, 2009 8:49 AM  Additional Follow-up for Phone Call Additional follow up Details #1::        cannot refill early, due 2/27.  We can refill tomorrow since fill date will coincide with weekend Additional Follow-up by: Toni Peat MD,  July 21, 2009 11:47 AM    Additional Follow-up for Phone Call Additional follow up Details #2::    refill Follow-up by: Toni Peat MD,  July 22, 2009 10:33 AM  Additional Follow-up for Phone Call Additional follow up Details #3:: Details for Additional Follow-up Action Taken: Pt informed ready for pick-up Additional Follow-up by: [name] LPN,  July 22, 2009 11:41 AM  Prescriptions: OXYCODONE-ACETAMINOPHEN 10-650 MG TABS (OXYCODONE-ACETAMINOPHEN) one by mouth qid as needed  #120 x 0   Entered and Authorized by:   Toni Peat MD   Signed by:   Toni Peat MD on 07/22/2009   Method used:   Print then Give to Patient   RxID:   [id number]

## 2010-06-27 NOTE — Progress Notes (Signed)
Summary: refill Zoloft X 4 months  Phone Note Refill Request Message from:  Fax from Pharmacy  Refills Requested: Medication #1:  ZOLOFT 100 MG TABS one by mouth bid   Brand Name Necessary? [facility] [phone]     Fax---[phone]  Initial call taken by: [name],  June 15, 2009 8:57 AM    Prescriptions: ZOLOFT 100 MG TABS (SERTRALINE HCL) one by mouth bid  #60 x 4   Entered by:   [name] LPN   Authorized by:   [name] MD   Signed by:   [name] LPN on 13/24/4010   Method used:   [facility]        [facility]  [address] [phone][facility] ([facility]       [address] [place], [place]  [address]       Ph: [phone] or [phone]       Fax: [phone]   RxID:   [id number]

## 2010-06-27 NOTE — Assessment & Plan Note (Signed)
Summary: 1 month rov/njr/pt rsc/cjr----PT [facility] // RS   Vital Signs:  Patient profile:   45 year old female Menstrual status:  hysterectomy Weight:      146 pounds Temp:     98.6 degrees F oral BP sitting:   120 / 80  (left arm) Cuff size:   regular  Vitals Entered By: [name] LPN (February 03, 2010 8:20 AM) CC: 1 month follow-up CBG Result 119   History of Present Illness: Here for follow up depression. Still feels "down".   Low motivation. No improvement with increased dose of Abilify. No suicidal ideation Possibly more fatigued on Abilify.  Has been on many other antidepressants over the years. She does not recall any specific side effects with Wellbutrin.  Allergies (verified): No Known Drug Allergies  Past History:  Past Medical History: Last updated: 09/02/2008 fibromyalgia colon polyps chronic pain syndrome Depression chronic insomnia PMH reviewed for relevance  Physical Exam  General:  Well-developed,well-nourished,in no acute distress; alert,appropriate and cooperative throughout examination Lungs:  Normal respiratory effort, chest expands symmetrically. Lungs are clear to auscultation, no crackles or wheezes. Heart:  normal rate and regular rhythm.   Psych:  normally interactive, good eye contact, and depressed affect.     Impression & Recommendations:  Problem # 1:  DEPRESSION (ICD-311) Assessment Deteriorated Add low dose Wellbutrin.  She has appt with psychologist in one week.  Decrease Abilify to 5mg . Her updated medication list for this problem includes:    Zoloft 100 Mg Tabs (Sertraline hcl) ..... One by mouth bid    Lorazepam 0.5 Mg Tabs (Lorazepam) ..... One by mouth q 8 hours as needed severe anxiety    Bupropion Hcl 150 Mg Xr24h-tab (Bupropion hcl) ..... One by mouth once daily  Complete Medication List: 1)  Zoloft 100 Mg Tabs (Sertraline hcl) .... One by mouth bid 2)  Ambien 10 Mg Tabs (Zolpidem tartrate) .... 1/2-1/po q hs 3)   Oxycodone-acetaminophen 10-650 Mg Tabs (Oxycodone-acetaminophen) .... One by mouth qid as needed 4)  Abilify 5 Mg Tabs (Aripiprazole) .... One by mouth once daily 5)  Lorazepam 0.5 Mg Tabs (Lorazepam) .... One by mouth q 8 hours as needed severe anxiety 6)  Bupropion Hcl 150 Mg Xr24h-tab (Bupropion hcl) .... One by mouth once daily  Other Orders: Capillary Blood Glucose/CBG (60454)  Patient Instructions: 1)  Please schedule a follow-up appointment in 2 months.  Prescriptions: OXYCODONE-ACETAMINOPHEN 10-650 MG TABS (OXYCODONE-ACETAMINOPHEN) one by mouth qid as needed  #120 x 0   Entered and Authorized by:   [name] MD   Signed by:   [name] MD on 02/03/2010   Method used:   Print then Give to Patient   RxID:   [id number] BUPROPION HCL 150 MG XR24H-TAB (BUPROPION HCL) one by mouth once daily  #30 x 5   Entered and Authorized by:   [name] MD   Signed by:   [name] MD on 02/03/2010   Method used:   [facility]        [facility]  [address] [phone][facility] ([facility])       [address] [place], [place]  [address]       Ph: [phone] or [phone]       Fax: [phone]   RxID:   [id number] ABILIFY 5 MG TABS (ARIPIPRAZOLE) one by mouth once daily  #30 x 5   Entered and Authorized by:  [name] MD   Signed by:   [name] MD on 02/03/2010   Method used:   [facility]        [facility]  [address] [phone]* (retail)       [address] [place], [place]  [address]       Ph: [phone] or [phone]       Fax: [phone]   RxID:   [id number]

## 2010-06-27 NOTE — Progress Notes (Signed)
Summary: Pt req rx for Oxycodone 10-650mg  for pick up 08/19/09 in a.m.  Phone Note Call from Patient Call back at [facility] Phone [phone]   Caller: Patient Summary of Call: Pt req rx for Oxycodone 10-650mg . Pt wants to pick this up on Friday 08/19/09 in the morning.      Initial call taken by: [name],  August 17, 2009 9:04 AM  Follow-up for Phone Call        Pt due for refills 3/25 [facility] LPN  August 17, 2009 10:14 AM written Follow-up by: [name] MD,  August 18, 2009 8:27 AM  Additional Follow-up for Phone Call Additional follow up Details #1::        Pt informed Rx ready for pick-up on personally identified VM Additional Follow-up by: [name] LPN,  August 18, 2009 8:57 AM    Prescriptions: OXYCODONE-ACETAMINOPHEN 10-650 MG TABS (OXYCODONE-ACETAMINOPHEN) one by mouth qid as needed  #120 x 0   Entered and Authorized by:   [name] MD   Signed by:   [name] MD on 08/18/2009   Method used:   Print then Give to Patient   RxID:   [id number]

## 2010-06-27 NOTE — Progress Notes (Signed)
Summary: new rx Oxycodone  Phone Note Refill Request Call back at Home Phone [phone] Message from:  Patient  Refills Requested: Medication #1:  OXYCODONE-ACETAMINOPHEN 10-650 MG TABS one by mouth qid as needed new rx  Initial call taken by: [name],  April 26, 2010 3:07 PM  Follow-up for Phone Call        This is not due until after DEC 4.  Her last refill was Nov 4 and that was a few days early so she should have enough meds until I am back next week. Follow-up by: [name] MD,  April 26, 2010 5:33 PM  Additional Follow-up for Phone Call Additional follow up Details #1::        pt is aware doc will refill next week Additional Follow-up by: [name],  April 27, 2010 5:12 PM    Additional Follow-up for Phone Call Additional follow up Details #2::    Pt informed RX ready for pick-up Follow-up by: [name] LPN,  May 01, 2010 8:24 AM  New/Updated Medications: OXYCODONE-ACETAMINOPHEN 10-325 MG TABS (OXYCODONE-ACETAMINOPHEN) one by mouth q 6 hours Prescriptions: OXYCODONE-ACETAMINOPHEN 10-325 MG TABS (OXYCODONE-ACETAMINOPHEN) one by mouth q 6 hours  #120 x 0   Entered and Authorized by:   [name] MD   Signed by:   [name] MD on 05/01/2010   Method used:   Print then Give to Patient   RxID:   [id number]

## 2010-06-27 NOTE — Progress Notes (Signed)
Summary: REFILL REQUEST  Phone Note Refill Request Message from:  Patient on December 08, 2009 2:14 PM  Refills Requested: Medication #1:  OXYCODONE-ACETAMINOPHEN 10-650 MG TABS one by mouth qid as needed   Notes: Pt can be reached at [phone] when Rx is ready for p/u.    Initial call taken by: Debbra Riding,  December 08, 2009 2:15 PM    Prescriptions: OXYCODONE-ACETAMINOPHEN 10-650 MG TABS (OXYCODONE-ACETAMINOPHEN) one by mouth qid as needed  #120 x 0   Entered and Authorized by:   Gordy Savers  MD   Signed by:   Gordy Savers  MD on 12/08/2009   Method used:   Print then Give to Patient   RxID:   [id number]   Appended Document: REFILL REQUEST OK for p/u 12-09-2009  Appended Document: REFILL REQUEST pt aware rx ready for pick up in AM.   KIK

## 2010-06-27 NOTE — Progress Notes (Signed)
Summary: Pt req script for OXYCODONE-ACETAMINOPHEN 10-650 MG TABS  Phone Note Refill Request Call back at Home Phone [phone] Message from:  Patient  Refills Requested: Medication #1:  OXYCODONE-ACETAMINOPHEN 10-650 MG TABS one by mouth qid as needed   Dosage confirmed as above?Dosage Confirmed Pt req to pick up script on Friday 11/4/11by 10am if possible. Pls call when ready for pick up.    Method Requested: Pick up at Office Initial call taken by: [name],  March 29, 2010 4:07 PM  Follow-up for Phone Call        Last filled 10/7 will write Follow-up by: [name] MD,  March 31, 2010 8:58 AM  Additional Follow-up for Phone Call Additional follow up Details #1::        Pt aware Additional Follow-up by: [name] LPN,  March 31, 2010 9:20 AM    Prescriptions: OXYCODONE-ACETAMINOPHEN 10-650 MG TABS (OXYCODONE-ACETAMINOPHEN) one by mouth qid as needed  #120 x 0   Entered and Authorized by:   [name] MD   Signed by:   [name] MD on 03/31/2010   Method used:   Print then Give to Patient   RxID:   [id number]

## 2010-06-27 NOTE — Progress Notes (Signed)
Summary: Pt req refill of Percocet  Phone Note Call from Patient Call back at [facility] Phone [phone]   Caller: Patient Summary of Call: Pt req refill of Percocet. Pt doesnt need until tomorrow afternoon.   Initial call taken by: [name],  June 23, 2009 8:11 AM  Follow-up for Phone Call        Last written Rx filled 12/29, #120, 0 RF [name] LPN  June 23, 2009 8:32 AM   Additional Follow-up for Phone Call Additional follow up Details #1::        Will refill Additional Follow-up by: [name] MD,  June 23, 2009 12:35 PM    Additional Follow-up for Phone Call Additional follow up Details #2::    Informed pt on personally identified VM [name] LPN  June 23, 2009 1:25 PM   Prescriptions: OXYCODONE-ACETAMINOPHEN 10-650 MG TABS (OXYCODONE-ACETAMINOPHEN) one by mouth qid as needed  #120 x 0   Entered and Authorized by:   [name] MD   Signed by:   [name] MD on 06/23/2009   Method used:   Print then Give to Patient   RxID:   [id number]

## 2010-06-29 ENCOUNTER — Encounter: Payer: Self-pay | Admitting: Family Medicine

## 2010-06-29 ENCOUNTER — Ambulatory Visit (INDEPENDENT_AMBULATORY_CARE_PROVIDER_SITE_OTHER): Payer: PRIVATE HEALTH INSURANCE | Admitting: Family Medicine

## 2010-06-29 VITALS — BP 100/60 | HR 72 | Temp 98.3°F | Resp 14 | Ht 62.0 in | Wt 146.0 lb

## 2010-06-29 DIAGNOSIS — F32A Depression, unspecified: Secondary | ICD-10-CM

## 2010-06-29 DIAGNOSIS — M899 Disorder of bone, unspecified: Secondary | ICD-10-CM

## 2010-06-29 DIAGNOSIS — F329 Major depressive disorder, single episode, unspecified: Secondary | ICD-10-CM

## 2010-06-29 DIAGNOSIS — M8580 Other specified disorders of bone density and structure, unspecified site: Secondary | ICD-10-CM

## 2010-06-29 DIAGNOSIS — F3289 Other specified depressive episodes: Secondary | ICD-10-CM

## 2010-06-29 DIAGNOSIS — IMO0001 Reserved for inherently not codable concepts without codable children: Secondary | ICD-10-CM

## 2010-06-29 DIAGNOSIS — M797 Fibromyalgia: Secondary | ICD-10-CM

## 2010-06-29 MED ORDER — OXYCODONE-ACETAMINOPHEN 10-325 MG PO TABS: 1.0000 | ORAL_TABLET | Freq: Four times a day (QID) | ORAL | Status: DC | PRN

## 2010-06-29 MED ORDER — ESTROGENS CONJUGATED 0.45 MG PO TABS: 0.4500 mg | ORAL_TABLET | Freq: Every day | ORAL | Status: DC

## 2010-06-29 MED ORDER — SERTRALINE HCL 100 MG PO TABS: 100.0000 mg | ORAL_TABLET | Freq: Two times a day (BID) | ORAL | Status: DC

## 2010-06-29 NOTE — Assessment & Plan Note (Signed)
Summary: chest congestion/cough/njr/pt rsc/cjr   Vital Signs:  Patient profile:   45 year old female Menstrual status:  hysterectomy Weight:      142 pounds Temp:     98.1 degrees F oral BP sitting:   110 / 80  (left arm) Cuff size:   regular  Vitals Entered By: Duard Brady LPN (May 16, 2010 4:16 PM) CC: c/o chest congestion , non productive cough, achey Is Patient Diabetic? No   CC:  c/o chest congestion , non productive cough, and achey.  History of Present Illness: 45 year old patient who presents with a one week history of nonproductive refractory cough.  She has had some achiness and a sense of low-grade fever, but no documented fever or chills.  Her chief complaint is incessant cough.  It interferes with sleep. she has a history of chronic pain and is on chronic narcotics.  Preventive Screening-Counseling & Management  Alcohol-Tobacco     Smoking Status: never  Allergies (verified): No Known Drug Allergies  Past History:  Past Medical History: Reviewed history from 09/02/2008 and no changes required. fibromyalgia colon polyps chronic pain syndrome Depression chronic insomnia  Review of Systems       The patient complains of anorexia, fever, and prolonged cough.  The patient denies weight loss, weight gain, vision loss, decreased hearing, hoarseness, chest pain, syncope, dyspnea on exertion, peripheral edema, headaches, hemoptysis, abdominal pain, melena, hematochezia, severe indigestion/heartburn, hematuria, incontinence, genital sores, muscle weakness, suspicious skin lesions, transient blindness, difficulty walking, depression, unusual weight change, abnormal bleeding, enlarged lymph nodes, angioedema, and breast masses.    Physical Exam  General:  Well-developed,well-nourished,in no acute distress; alert,appropriate and cooperative throughout examination Head:  Normocephalic and atraumatic without obvious abnormalities. No apparent alopecia or  balding. Eyes:  No corneal or conjunctival inflammation noted. EOMI. Perrla. Funduscopic exam benign, without hemorrhages, exudates or papilledema. Vision grossly normal. Ears:  External ear exam shows no significant lesions or deformities.  Otoscopic examination reveals clear canals, tympanic membranes are intact bilaterally without bulging, retraction, inflammation or discharge. Hearing is grossly normal bilaterally. Nose:  External nasal examination shows no deformity or inflammation. Nasal mucosa are pink and moist without lesions or exudates. Mouth:  Oral mucosa and oropharynx without lesions or exudates.  Teeth in good repair. Neck:  No deformities, masses, or tenderness noted. Lungs:  Normal respiratory effort, chest expands symmetrically. Lungs are clear to auscultation, no crackles or wheezes. Heart:  Normal rate and regular rhythm. S1 and S2 normal without gallop, murmur, click, rub or other extra sounds.   Impression & Recommendations:  Problem # 1:  VIRAL URI (ICD-465.9)  Her updated medication list for this problem includes:    Benzonatate 200 Mg Caps (Benzonatate) ..... One every 8 hours for cough  Problem # 2:  CHRONIC PAIN SYNDROME (ICD-338.4)  Complete Medication List: 1)  Zoloft 100 Mg Tabs (Sertraline hcl) .... One by mouth bid 2)  Ambien 10 Mg Tabs (Zolpidem tartrate) .... 1/2-1/po q hs 3)  Oxycodone-acetaminophen 10-325 Mg Tabs (Oxycodone-acetaminophen) .... One by mouth q 6 hours 4)  Abilify 5 Mg Tabs (Aripiprazole) .... One by mouth once daily 5)  Lorazepam 0.5 Mg Tabs (Lorazepam) .... One by mouth q 8 hours as needed severe anxiety 6)  Bupropion Hcl 150 Mg Xr24h-tab (Bupropion hcl) .... One by mouth once daily 7)  Controlled Substances Contract Signed 03/28/2010  8)  Benzonatate 200 Mg Caps (Benzonatate) .... One every 8 hours for cough  Patient Instructions: 1)  Get plenty of  rest, drink lots of clear liquids, and use Tylenol or Ibuprofen for fever and comfort.  Return in 7-10 days if you're not better:sooner if you're feeling worse. Prescriptions: BENZONATATE 200 MG CAPS (BENZONATATE) one every 8 hours for cough  #21 x 0   Entered and Authorized by:   Gordy Savers  MD   Signed by:   Gordy Savers  MD on 05/16/2010   Method used:   [facility] to        [facility]  [address] [phone] (retail)       [address]       [place], [place]  [address]       Ph: [phone] or [phone]       Fax: [phone]   RxID:   [id number]    Orders Added: 1)  Est. Patient Level III [36644]

## 2010-06-29 NOTE — Progress Notes (Signed)
  Subjective:    Patient ID: Toni Collins, female    DOB: [birth year], 45 y.o.   MRN: [id number]  HPI Patient is seen for the following issues.  Recent DEXA scan which reveals T score of -2.4 lumbar spine. Patient has a history of adult fracture. Risk factors for osteoporosis include sedentary lifestyle, family history, and excessive caffeine use. Nonsmoker. Her major risk factor is total abdominal hysterectomy several years ago and she was only briefly on estrogen. Unclear why she was taken off this. She had no side effects. Recent mammogram unremarkable. She does not take calcium or vitamin D consistently.  Patient has history of fibromyalgia. Chronic pain syndrome. Medications reviewed. Compliant with all.  History of depression. Remains on sertraline and low-dose Abilify. No side effects other than low libido. Needs refill sertraline.    Review of Systems  Denies any appetite or weight changes. No recent headaches. Denies chest pain, dyspnea, abdominal pain, or change in urine or stool habits.     Objective:   Physical Exam  Patient is alert in no distress. HEENT exam returns are normal. Oropharynx is clear. Pupils equal round to light. Neck no thyromegaly or adenopathy Chest clear to auscultation Heart regular rhythm and rate with no murmur Extremities no edema. Neuro full-strength throughout. Cranial nerves II through XII normal Skin no rash                    Assessment & Plan:  #1  osteopenia. Risk factors include long history of estrogen deficiency , Sedentary lifestyle, family history, excessive caffeine use. Counseled regarding increased calcium and vitamin D as well as exercise. Long discussion regarding options. Start low-dose estrogen 0.45 mg one daily. Repeat DEXA scan 2 years #2 history of depression stable. No change in medications. #3 history of fibromyalgia and chronic pain syndrome stable. Refill chronic medication

## 2010-06-29 NOTE — Patient Instructions (Signed)
Osteoporosis   Bones can become stronger or weaker over time due to a number of things. With osteoporosis, bones can become so weak (brittle) that they can break easily. Osteoporosis means porous bones (sponge looking). Osteopenia is a less severe weakness of the bones, which places you at risk for osteoporosis. It is important to identify if you have osteoporosis or osteopenia. Breaking (fractures) bones (especially hip and spine) is a major cause of going to the hospital, loss of independence and death.   CAUSES There are a number of causes and risk factors:  Gender: women are at a higher risk for osteoporosis than men.  Age: bone formation slows down with age.  Ethnicity: for unclear reasons, Caucasian and Asian women are at higher risk for osteoporosis; Hispanic and African-American women are at increased, but lesser, risk.  Family history of osteoporosis can mean that you are at a higher risk for getting it.  History of bone fractures indicates you may be at higher risk of another.  Calcium is very important for bone health and strength. Not enough calcium in your diet increases your risk for osteoporosis. Vitamin D is important for calcium metabolism. You get vitamin D from sunlight, foods or supplements  Physical activity: bones get stronger with weight bearing exercise and weaker without use.  Smoking is associated with decreased bone strength.  Medications: cortisone medications, too much thyroid medication, some cancer and seizure medicines and others can weaken bones and cause osteoporosis.  Decreased body weight is associated with osteoporosis. The small amount of estrogen-type molecules produced in fat cells seems to protect the bones.  Menopausal decrease in the hormone estrogen can cause osteoporosis.  Low levels of the hormone testosterone can cause osteoporosis.  Some medical conditions can lead to osteoporosis (hyperthyroidism, hyperparathyroidism, B12 deficiency, for  example).   SYMPTOMS No symptoms are usually felt as bones weaken. The first symptoms are generally related to bone fractures. You may have silent, tiny bone fractures, especially in your spine. This can cause height loss and forward bending of the spine (kyphosis).   DIAGNOSIS You or your caregiver may suspect osteoporosis based on height loss and forward bending of the spine (kyphosis). Osteoporosis or osteopenia may be identified on an x-ray done for other reasons. A bone density measurement will likely be taken. Your bones are often measured at your lower spine or your hips. Measurement is done by a special x-ray called a DEXA scan, or sometimes by a specialized CT scan. Other tests may be done to find the cause of osteoporosis (for example, blood tests to measure calcium and Vitamin D), or to monitor treatment.   TREATMENT The goal of osteoporosis treatment is to prevent fractures. This is done through medical and nonmedical treatments. The treatment will slow the weakening of your bones and strengthen them where possible. Measures to decrease the likelihood of falling and fracturing a bone are also important.   Nonmedical  You should have enough calcium and Vitamin D in your diet.  Do not smoke. If you smoke, ask for help to stop.  Exercise, especially weight bearing exercise, helps strengthen bones. Strength and balance exercise (Tai Chi) helps to prevent falls.  There are a number of things you can do to prevent falls listed in the Home Care section below. Ask your caregiver for other advice.   Medical  You may need supplements if you are not getting enough calcium, Vitamin D and Vitamin B12 for example.  If you are female and menopausal, you  should discuss the option of estrogen replacement or estrogen-like medication with your caregiver.  Medicines can be taken by mouth or injection to help build bone strength. When taken by mouth, there are important directions that you need to  follow.   Calcitonin is a hormone made by the thyroid gland that can help build bone strength and decrease fracture risk in the spine. It can be taken by nasal spray or injection.  Parathyroid hormone can be injected to help build bone strength.  You need to continue to get enough calcium intake with any of these medications.   HOME CARE INSTRUCTIONS  Avoid falls. l If you are unsteady on your feet, use a cane or walk with someone's help. l Remove loose rugs or electrical cords. l Keep your home well lit at night. l Avoid icy streets and wet or waxed floors.  l Hold the railing when using stairs. l Watch out for your pets. l Install grab bars in your bathroom.  To pick up objects, bend at the knees. DO NOT bend with your back.  Do not smoke! If you smoke, ask for help to stop.  You should have adequate calcium and Vitamin D in your diet (speak with your caregiver about amounts).  Before exercising, ask your caregiver what exercises will be good for you.  Take any prescription medication exactly as directed.   Only take over-the-counter or prescription medicines for pain, discomfort or fever as directed by your caregiver. Sometimes stronger pain medication is prescribed. Use only as directed.   SEEK MEDICAL CARE IF:   You had a fracture and your pain is not well controlled.  You had a fracture and you are not able to return to your activities as expected.  You  injured yourself again.  You develop side effects from medications (especially stomach pain or swallowing trouble while on oral osteoporosis medication).  You develop new unexplained problems.    SEEK IMMEDIATE MEDICAL CARE IF:  You develop sudden, severe pain in your back.  You develop pain after an injury or fall.   Document Released: 02/21/2005  Document Re-Released: 10/31/2007 [facility] Patient Information 2011 [place], [place].

## 2010-06-29 NOTE — Progress Notes (Signed)
Summary: Rx Refill Oxycodone, last filled 05-01-10  Phone Note Refill Request Call back at Home Phone [phone] Message from:  Patient on May 30, 2010 10:02 AM  Refills Requested: Medication #1:  OXYCODONE-ACETAMINOPHEN 10-325 MG TABS one by mouth q 6 hours  Method Requested: Pick up at Office Initial call taken by: Trixie Dredge,  May 30, 2010 10:02 AM  Follow-up for Phone Call        Last filled #120 on 05/01/2010 [name] LPN  May 30, 2010 1:47 PM will refill Follow-up by: [name] MD,  May 31, 2010 8:03 AM  Additional Follow-up for Phone Call Additional follow up Details #1::        Pt aware Additional Follow-up by: [name] LPN,  May 31, 2010 10:04 AM    Prescriptions: OXYCODONE-ACETAMINOPHEN 10-325 MG TABS (OXYCODONE-ACETAMINOPHEN) one by mouth q 6 hours  #120 x 0   Entered and Authorized by:   [name] MD   Signed by:   [name] MD on 05/31/2010   Method used:   Print then Give to Patient   RxID:   [id number]

## 2010-06-30 ENCOUNTER — Ambulatory Visit: Payer: Self-pay | Admitting: Family Medicine

## 2010-07-26 ENCOUNTER — Telehealth: Payer: Self-pay | Admitting: Family Medicine

## 2010-07-26 DIAGNOSIS — G894 Chronic pain syndrome: Secondary | ICD-10-CM

## 2010-07-26 DIAGNOSIS — M797 Fibromyalgia: Secondary | ICD-10-CM

## 2010-07-26 NOTE — Telephone Encounter (Signed)
Pt called and is req script for Percocet 10 mg. Pt will pick up on Friday 07/27/09. Pls call when ready.

## 2010-07-28 MED ORDER — OXYCODONE-ACETAMINOPHEN 10-325 MG PO TABS: 1.0000 | ORAL_TABLET | Freq: Four times a day (QID) | ORAL | Status: DC | PRN

## 2010-07-28 NOTE — Telephone Encounter (Signed)
Will refill.

## 2010-07-28 NOTE — Telephone Encounter (Signed)
Pt informed Rx ready for pick up. 

## 2010-08-23 ENCOUNTER — Telehealth: Payer: Self-pay | Admitting: Family Medicine

## 2010-08-23 DIAGNOSIS — G894 Chronic pain syndrome: Secondary | ICD-10-CM

## 2010-08-23 NOTE — Telephone Encounter (Signed)
Pt requesting refill Oxycodone, last filled 3/2 #120, 0 refills. Would like to pick up Friday

## 2010-08-23 NOTE — Telephone Encounter (Signed)
Last filled 3/2 #120, refill 0

## 2010-08-23 NOTE — Telephone Encounter (Signed)
Pt called req script for Oxycodone 10-325 mg Pls call pt when ready for pick on Friday 08/25/10.

## 2010-08-24 NOTE — Telephone Encounter (Signed)
OK to refill Friday. 

## 2010-08-24 NOTE — Telephone Encounter (Signed)
Pt does not need until Friday this week

## 2010-08-25 MED ORDER — OXYCODONE-ACETAMINOPHEN 10-325 MG PO TABS: 1.0000 | ORAL_TABLET | Freq: Four times a day (QID) | ORAL | Status: DC | PRN

## 2010-08-25 NOTE — Telephone Encounter (Signed)
rx ready for pickup 

## 2010-09-13 ENCOUNTER — Other Ambulatory Visit: Payer: Self-pay | Admitting: *Deleted

## 2010-09-13 NOTE — Telephone Encounter (Signed)
Ok to refill for 3 months.  

## 2010-09-13 NOTE — Telephone Encounter (Signed)
Pharmacy requesting refill of zolpidem 10 mg.  Last filled 08/11/10

## 2010-09-14 MED ORDER — ZOLPIDEM TARTRATE 10 MG PO TABS: ORAL_TABLET | ORAL | Status: DC

## 2010-09-14 NOTE — Telephone Encounter (Signed)
[facility] re: status for pts Z[facility]pidem refill. Pharmacist has been given refill info noted below.

## 2010-09-20 ENCOUNTER — Telehealth: Payer: Self-pay | Admitting: Family Medicine

## 2010-09-20 NOTE — Telephone Encounter (Signed)
Pt called req script for Oxycodone 10-325 mg Pls call pt when ready for pick on Friday 09/22/10 afternoon. Pt leaving to go out of town.

## 2010-09-20 NOTE — Telephone Encounter (Signed)
No due until Monday (30th).

## 2010-09-20 NOTE — Telephone Encounter (Signed)
Last filled on 08-25-10 #120 with 0 refills

## 2010-09-22 ENCOUNTER — Telehealth: Payer: Self-pay | Admitting: Family Medicine

## 2010-09-22 NOTE — Telephone Encounter (Signed)
Error/ pt is aware of previous message/njr

## 2010-09-22 NOTE — Telephone Encounter (Signed)
Pt called to check on status of picking up script for Oxycodone today. Pt has been informed that Dr [name] denied req for early pick up. Not due until 09/25/10 on Monday.

## 2010-09-24 NOTE — Telephone Encounter (Signed)
May refill Oxycodone.

## 2010-09-25 ENCOUNTER — Other Ambulatory Visit: Payer: Self-pay | Admitting: Family Medicine

## 2010-09-25 DIAGNOSIS — G894 Chronic pain syndrome: Secondary | ICD-10-CM

## 2010-09-25 MED ORDER — OXYCODONE-ACETAMINOPHEN 10-325 MG PO TABS: 1.0000 | ORAL_TABLET | Freq: Four times a day (QID) | ORAL | Status: DC | PRN

## 2010-09-25 NOTE — Telephone Encounter (Signed)
Rx refilled as requested, will inform pt ready for pick-up

## 2010-09-25 NOTE — Telephone Encounter (Signed)
Will refill today and inform pt ready to pick-up

## 2010-10-11 ENCOUNTER — Ambulatory Visit (INDEPENDENT_AMBULATORY_CARE_PROVIDER_SITE_OTHER): Payer: PRIVATE HEALTH INSURANCE | Admitting: Family Medicine

## 2010-10-11 ENCOUNTER — Encounter: Payer: Self-pay | Admitting: Family Medicine

## 2010-10-11 VITALS — BP 110/74 | Temp 98.7°F | Wt 141.0 lb

## 2010-10-11 DIAGNOSIS — F419 Anxiety disorder, unspecified: Secondary | ICD-10-CM

## 2010-10-11 DIAGNOSIS — F329 Major depressive disorder, single episode, unspecified: Secondary | ICD-10-CM

## 2010-10-11 DIAGNOSIS — F3289 Other specified depressive episodes: Secondary | ICD-10-CM

## 2010-10-11 DIAGNOSIS — F411 Generalized anxiety disorder: Secondary | ICD-10-CM

## 2010-10-11 MED ORDER — LORAZEPAM 0.5 MG PO TABS: 0.5000 mg | ORAL_TABLET | Freq: Three times a day (TID) | ORAL | Status: DC | PRN

## 2010-10-11 NOTE — Patient Instructions (Signed)
Be in touch if you feel mood is worsening. Try to exercise consistently.

## 2010-10-11 NOTE — Progress Notes (Signed)
  Subjective:    Patient ID: Toni Collins, female    DOB: 1965-09-01, 45 y.o.   MRN: [id number]  HPI Patient is seen for followup. She has a long history of recurrent depression. She remains on Zoloft 100 mg daily and Abilify 5 mg at night. Her father died low over one year ago. She seemed to get through that okay. She's had about 4 days of increased anxiousness for no apparent reason. No specific stressors. Sleep generally okay. Has used lorazepam in the past for similar flare up which seemed to help. Denies any chest pains. No dyspnea. No suicidal thoughts.   Review of Systems  Constitutional: Positive for fatigue. Negative for fever.  Respiratory: Negative for shortness of breath.   Cardiovascular: Negative for chest pain.  Neurological: Negative for dizziness, syncope and headaches.  Psychiatric/Behavioral: Positive for sleep disturbance and dysphoric mood. Negative for suicidal ideas, confusion, self-injury and agitation. The patient is nervous/anxious.        Objective:   Physical Exam  Constitutional: She is oriented to person, place, and time. She appears well-developed and well-nourished.  Cardiovascular: Normal rate, regular rhythm and normal heart sounds.   No murmur heard. Pulmonary/Chest: Effort normal and breath sounds normal. No respiratory distress. She has no wheezes. She has no rales.  Musculoskeletal: She exhibits no edema.  Neurological: She is alert and oriented to person, place, and time. No cranial nerve deficit.          Assessment & Plan:  #1 recurrent depression Continue with Zoloft and Abilify. #2 nonspecific anxiety. Refill lorazepam for short-term use only 0.5 mg every 8 hours as needed. Talked about alternatives to treating anxiety with exercise and plenty of rest. Consider counseling if symptoms persist

## 2010-10-13 NOTE — Assessment & Plan Note (Signed)
1.  Toni Collins comes to the center of pain management today after a      phone call last week in which she had what sounds like a significant      level of anxiety after switching medications.  As we asked her to come      in and be evaluated, apparently there is more to this story.  She has      changed jobs, this is not working out.  She has had some anxiety at      home, although I perform a careful risk inventory and it does not sound      like there is any risk at home, or current risk in her current lifestyle      or situational conversion.  It sounds like she does have a pretty good      support system at home.  She brings her medication in and agrees for the      pharmacist to destroy this.  We give her a prescription for the      pharmacist to sign that this has happened, and I will bridge her with a      Roxicodone prescription until I can see her for the hypogastric plexus      block.  2.  She states no wish to harm self or others.  3.  She is very appropriate today, and it just sounds like she got a bit      overwhelmed with the number of changes in her life, but she is to let us      know if that becomes an issue, and not through her husband but to make      an appointment to come in and talk to [place], or report to the ER.  She does      understand that she does have a good support system at home, and Dr.      Leonides Cave will be helping from a psychological perspective. That is in the      works and __________  sister today to expedite that.   OBJECTIVE:  No significant change.   IMPRESSION:  1.  Interstitial cystitis, vulvodynia.  2.  Abdominal pain, unspecified.   PLAN:  1.  As outlined above, Roxicodone 50 tablets, monitor usage pattern.  She      will being her medications in at each visit.  She has been compliant.      We are very tentative at this point whether we are going to be moving      too far up the controlled substance ladder, but we will clearly base it   on legitimate medical needs and appropriate usage.  I think that she is      starting to understand that we are working for her in her best interest,      and finding continuity and a support structure here.  2.  Discharge instructions given.       HH/MedQ  D:  03/07/2004 11:58:53  T:  03/07/2004 16:07:28  Job #:  [id number]

## 2010-10-13 NOTE — Assessment & Plan Note (Signed)
She is a 45 year old married white female with two children who is also  working 45-50 hours a week.  She is back in today for a recheck and refill  of her medications.  She has approximately six pills left from her Percocet  of her last prescription, which was written for her on January 19, 2005.  She  was given enough to bridge her for today's appointment.   Her average pain is about a 7 on a scale of 10.  She has multiple areas of  pain complaints, including her shoulders, hands, feet and low back as well  as lower abdominal area.  At the last visit, I had ordered some blood tests,  an arthritis panel as well as a celiac sprue antibody evaluation.  Both of  these panels were negative for arthritis or celiac disease.   This was reviewed with her today.  She reports that her average pain is  about a 7 on a scale of 10.  Her sleep has overall been poor, but she  reports she has never been a good sleeper.  Pain is worse in the morning and  at evening, improved with rest and medication.  She is not really sure what  aggravates her pain.   Her functional status is actually very high.  She has two young children.  She is working 45-50 hours a week, doing a lot of driving.  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   REVIEW OF SYSTEMS:  Positive for depression, anxiety, dizziness, also  nausea, abdominal pain.   No changes in past medical, social or family history since last visit.   PHYSICAL EXAMINATION:  VITAL SIGNS:  Blood pressure 114/71, pulse 67,  respirations 16, 100% saturated on room air.  GENERAL:  She is a well-developed, thin adult female who does not appear in  any distress today.  She is oriented x3.  Affect bright, alert, cooperative  and pleasant.  MUSCULOSKELETAL/NEUROLOGIC:  Her gait is normal.  No guarding with palpation  of the lower abdomen.   IMPRESSION:  Abdominal pain for several decades, etiology not entirely  clear.   PLAN:  It has been awhile since she  had a Pap smear.  Would like her to go  ahead and do that.  May have her follow up with urology to rule out any kind  of interstitial cystitis.  As noted above, arthritis panel and celiac sprue  panel were negative.   Will refill her Percocet 10/650 mg one to one-half tablet p.o. q.i.d.  p.r.n., #100.  This will last her one month.           ______________________________  Brantley Stage, M.D.     DMK/MedQ  D:  01/31/2005 15:42:41  T:  02/01/2005 06:49:14  Job #:  [id number]
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
## 2010-10-13 NOTE — Assessment & Plan Note (Signed)
PATIENT:  Toni Collins   DATE OF BIRTH:  Aug 13, 1965   SURGEON:  Jewel Baize. Stevphen Rochester, M.D.   [facility].  I have  reviewed the Health and history form.  I reviewed the 14 point review of  systems.   1.  She has a good relationship with Dr. Leonides Cave which is helping her through      many troubles in life.  She states she does not wish to harm herself or      others.  I performed a risk inventory.  She has a safe environment and      she has no abuse at the current time.  She does understand how to      activate assistance if needed.  2.  She is appropriate to her medications.  I do not think a UDS is      necessary today.  Consider next visit.  I will go ahead and prescribe      her Percocet to last her for 2 months, 100 tablets, 1/2 to 1 at 10 mg      strength and slowly wean her away from controlled substances.  3.  Follow along expectantly.  Interventional procedure if warranted.   OBJECTIVE:  Modest suprapubic discomfort, soft, nontender abdomen.  She had  some flares, but seems to be only modestly painful today.  Some improvement  made.   IMPRESSION:  Interstitial cystitis, pelvic pain unspecified.   PLAN:  1.  Conservative management.  2.  Follow up in 2 months.  3.  Discharge instructions given.      HH/MedQ  D:  08/08/2004 09:52:58  T:  08/08/2004 10:09:16  Job #:  [id number]

## 2010-10-13 NOTE — Assessment & Plan Note (Signed)
[facility] and I  evaluated her.  I have reviewed the Health and history form. I reviewed the  14 point review of systems.   1.  Toni Collins is having adequate function and quality of life indices on      current medication regimen, decreasing her oxycodone.  I think her ideal      dosing is going to be somewhere around the 10 mg level, 3-4 times a day,      and we will prescribe 100 of these as we do have an appropriate pill      count today and she is appropriate.  We will be very watchful of these      medications.  She understands that she should brings these in.  She      brings these in today, and we reviewed the patient care agreement and      opioid contract.  2.  Do not believe further imaging or diagnostic studies are warranted.  3.  We may approach her with a hypogastric plexus block as this will be a      multi-modality approach to dealing with her pain.  Overall she is bright      and engaging today.  Continues to work, tolerating Lyrica which is      beneficial.  I think we are very stable.  I will wait a month to see how      we are doing.  4.  She is obtaining a divorce.  She is seeing a psychological care      Toni Collins.  I do not see any elements of depressive indices, suicidal      ideation, or other risk items, particularly at home.  We will maintain a      wide differential here.  She states no wish to harm self or others.   OBJECTIVE:  Abdomen is soft.  Tender suprapubic.  Myofascial pain to the  para-lumbar region and flank pain.  No new neurological findings motor,  sensory or reflexive.   IMPRESSION:  Interstitial cystitis.   PLAN:  1.  Conservative management.  2.  Follow up in one month.  3.  Discharge instructions given.  4.  Lifestyle enhancements reviewed.  5.  Review of medication.       HH/MedQ  D:  05/16/2004 11:54:31  T:  05/16/2004 16:20:45  Job #:  [id number]

## 2010-10-13 NOTE — Assessment & Plan Note (Signed)
DATE OF BIRTH:  Mar 29, 1966   MEDICAL RECORD NUMBER:  [id number]   [name] is a married 45 year old woman who is being seen in our pain  and rehabilitative clinic for chronic pain in multiple areas including  shoulders, knees. She also has complaints of swelling in her hands and her  feet, especially in the morning when she first wakes up. She also has  chronic abdominal pain.   Her average pain is about a 6 on a scale of 10. Sleep has been poor. Pain  improves with rest and medication. She is not really sure what makes it  worse. In general, she gets good relief with her current medications. She  basically only takes one-half to one Percocet four times a day.   She continues to stay quite functional. She works 50-60 hours a week. She  has started doing some working out in a gym. She does some abdominal  crunches and some squats, uses some light weights. She has no problems  climbing stairs. She does drive. 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   Denies suicidal ideation. Denies bowel or bladder problems. Does admit to  depression/anxiety.   No new changes in past medical history.   SOCIAL HISTORY:  Reports some recent use of alcohol, 25 ounces at night. She  reports 120 ounces of beer per week.   FAMILY HISTORY:  Noncontributory.   Last visit, she had a urine drug screen positive for alcohol, 28.7 mg/dcl,  and she was appropriate with the rest of her urine drug screen. It was  positive for hydromorphone and oxycodone - hydromorphone is a metabolite of  oxycodone.   Blood pressure is 115/74, pulse 61, respirations 16, 100% saturated on room  air. She is thin adult female. She does not appear in any distress. She is  oriented x3. Her affect is bright, alert, and she is cooperative and  pleasant. She is able to stand easily in the room. Her gait is normal,  normal heel-toe gait. Narrow base of support is noted. No balance issues  noted.  Reflexes symmetric, intact. Manual muscle testing nonfocal. She has  no obvious swelling in the fingers or the toes. She has no tenderness at  present with palpation of the hands or feet. However, she states that when  she does get discomfort and swelling in her hands, she feels it at the MCP  joints as well as the proximal interphalangeal joints both hands, and in the  feet she describes her pain into the met head area, not really distally into  the phalanges.   Again, no swelling noted in her extremities today, no abnormal  discoloration, no skin changes are noted.   IMPRESSION:  1.  Abdominal pain.  2.  Complaints of joint pain and swelling.   PLAN:  Recommend follow up with rheumatologist through primary care  physician. Will also recommend urologist, Dr. Marcelyn Bruins, to rule out any  kind of interstitial cystitis, given her known complaints of abdominal pain.  Will go ahead and refill her Percocet 10/650 one-half to one p.o. q.i.d.  p.r.n. abdominal pain, #100. Recommend she does not drink alcohol while on  Percocet. Will check random drug screen in the next month or two.  She states she understands. There are no barriers to communication today.  She states she will comply with my recommendations regarding alcohol use.  Will see her back then in a month.  ______________________________  Brantley Stage, M.D.     DMK/MedQ  D:  04/09/2005 13:22:02  T:  04/10/2005 08:49:27  Job #:  [id number]
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
## 2010-10-13 NOTE — Assessment & Plan Note (Signed)
Tasheena Wambolt comes to Pain Management today and I evaluated her Health  and history form. I reviewed the 14 point review of systems.   1.  Jamayah is compliant.  She brings her medications in.  She is eating      appropriately.  She is still having trouble with the pelvic area, and I      am going to work with the diagnosis now of Interstitial      cystitis/vulvodynia.  To this end, I carefully discussed risks,      complications, and options of moving forward with interventional      techniques as well as medication management.  I will go ahead and      proceed with hypogastric plexus block to minimize potential effects of      narcotic-based pain medication and improve function and quality of life      issues.  I reviewed this in detail with her.  2.  Do not feel further imaging and diagnostics warranted.  3.  This does not appear to be anything infectious, but she is familiar with      this.  I asked her to maintain contact with primary care.  4.  We will consider a series of hypogastric plexus blocks, based on need      and response.   OBJECTIVE:  Suprapubic discomfort, not significantly changed.  There is some  modest flank pain, mostly myofascial pain noted.  No new neurological  findings motor, sensory or reflexive.   IMPRESSION:  1.  Interstitial cystitis.  2.  Vulvodynia.   PLAN:  1.  Hypogastric plexus block.  2.  Will see her in followup.  3.  Discharge Instructions given.  4.  Lifestyle enhancements reviewed.       HH/MedQ  D:  02/29/2004 12:11:57  T:  02/29/2004 14:43:23  Job #:  [id number]

## 2010-10-13 NOTE — Assessment & Plan Note (Signed)
REASON FOR VISIT:  Ms. Dedeaux is a 45 year old, married, white female who  was seen for the first time by me on Oct 04, 2004.  She has a long history  of abdominal pain dating back to when she was approximately 45 years old.  She has seen multiple physicians over the years for her abdominal pain.  She  complains today not as much of the abdomen, however, she has some joint  complaints including MCP joint pain bilaterally and reported some swelling,  especially in the morning, wrist pain, shoulder pain and left knee pain.  Her average pain is about a 7/10 described as aching sometimes.  Sleep has  been poor.  Pain is particularly worse with a variety of activity and  improves with rest and medication.  Relief with current medication is  actually quite good.   She is able to be up walking basically as long as she wants to.  She reports  a couple hours at a time.  She can climb stairs and she drives.  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  Denies any  suicidal ideation.  Denies any problems controlling bowel or bladder.  She  does admit to diarrhea, abdominal pain and limb swelling, especially the  hands.   There are no new changes in past medical or social history.  Further review  of family history reveals a mother with arthritis and is status post several  joint replacements and has been on agents.  She has a brother who committed  suicide at age 37.  She has another brother age 79 who has a history of  depression and alcohol abuse.   PHYSICAL EXAMINATION:  GENERAL:  She is a well-developed, well-nourished  woman who does not appear any distress.  VITAL SIGNS:  Blood pressure 120/78, pulse 76, respirations 16, 100%  saturations on room air.  NEUROLOGIC:  She is oriented x3, pleasant.  Affect is bright.  Alert and  cooperative.  She is able to stand without any difficulty.  Her gait is  entirely normal.  She has excellent range in the lumbar spine  as well as in  the cervical spine.  Full shoulder range of motion.  Reflexes are symmetric  in the upper and lower extremities.  There is no abnormal tone.  Toes are  downgoing.  No clonus is noted.  She has full range of motion in the  shoulder joints bilaterally as well as elbows, hands and wrists.  No  swelling is noted.  No tenderness is noted in the PIP or DIP bilaterally.  She remarks that she may have some mild tenderness in her MCP joint on the  right, but not on the left.  Again, I appreciate no synovial thickening or  worsening of her tenderness with palpation.   IMPRESSION:  1.  Abdominal pain for several decades.  Etiology not entirely clear.  2.  Complaints of arthritic pain with family history of rheumatoid      arthritis.   PLAN:  Will obtain an arthritic panel on her today as well as a celiac sprue  panel for her chronic abdominal pain.  Also, given the fact that she has  immunologic disease in her family with her mother with rheumatoid arthritis,  checking celiac sprue panel is warranted.  I do not see that it has been  repeated in previous GI workup.   Next visit, may consider starting her on aerobic conditioning program  educating  her on body mechanics with posture and ergonomics.  May also  consider bone density.  She tells me she is anemic.  Maybe this is  consistent with a malabsorption type syndrome.  Will refill her medications  today with Percocet 10/650 1/2 tablet to 1 tablet p.o. q.i.d. p.r.n. pain,  #100.  She has been appropriate with the use of this medication.  May also  at some point consider Lidoderm and Lyrica with her.       DMK/MedQ  D:  12/01/2004 14:14:02  T:  12/01/2004 15:50:55  Job #:  [id number]

## 2010-10-13 NOTE — Assessment & Plan Note (Signed)
PATIENT:  Toni Collins.   MEDICAL RECORD NUMBER:  [id number].   DATE OF BIRTH:  Nov 02, 1965.   SURGEON:  Jewel Baize. Stevphen Rochester, M.D.   [facility] our  request.   1.  She is ahead by 20 tablets and I counsel.  I do think legitimate medical      need is met with these controlled substances and I review the opioid      policy and opioid consent.  I review risks, complications and options of      these medicines and expectations as to compliance.   1.  I am going to increase her analgesic capacity to q.i.d. dosing, but      being mindful that we are going to do what we can to minimize escalation      of controlled substances.  She cannot afford the __________.  That may      be a reason why she required more analgesics, so we will sample her with      the __________.   1.  She reveals that she is starting to have troubles at home with her      husband.  Another a rationale for using the psychiatric services is to      help with her troubles in this regard.  She has not made contact due to      financial reasons, but agrees to do so.  She has some savings and      although this is not ideal, this is going to be a well rounded and      mandatory approach to dealing with her pain, particularly for successful      arena to return to work and productive member of society.   1.  Consider interventional procedures.  This would help [place] further decrease      controlled substances.  States no wish to harm self or others.      Chaperoned environment, at no time physician left unattended.   OBJECTIVE:  No significant change.   IMPRESSION:  1.  Interstitial cystitis.  2.  Vulvodynia.   PLAN:  1.  Conservative management.  I will go ahead and give her enough analgesic      capacity for the next few weeks as she establishes contact with the      psychiatric care Toni Collins.  Cautions to this medication to driving,      potential  habituating nature and making important cognitive decisions.  2.  __________ sampled.  3.  Follow up with [place] is discussed.       HH/MedQ  D:  04/11/2004 10:52:59  T:  04/11/2004 12:47:22  Job #:  [id number]

## 2010-10-13 NOTE — Assessment & Plan Note (Signed)
MEDICAL RECORD NUMBER:  [id number].   Toni Collins is back in for a brief recheck. She continues to have multiple  pain complaints including chronic abdominal pain, etiology not entirely  clear. She had some new right lateral hip pain. 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   She reports her average pain is about an 8 on a scale of 10. Sleep is poor.  She is unsure what makes her pain worse. Does improve with rest and  medications. She is able to climb stairs. She is drives. She is working 40  hours a week. No changes in health and history form on review of systems. No  changes in past medical, social or family history.   PHYSICAL EXAMINATION:  VITAL SIGNS:  Blood pressure today is 110/70, pulse  62, respirations 16, 100% saturated on room air.  GENERAL:  She is a well-developed, well-nourished woman. She does not appear  in any distress in our office today. Her affect is alert, cooperative and  pleasant. She is able to stand easily and independently after being seated.  Gait is normal.  ABDOMEN:  Abdomen is briefly examined with gentle palpation. She reports  tenderness in the abdomen. There is no guarding noted.  MUSCULOSKELETAL:  The right bilateral hips are evaluated today. She has  tenderness with palpation over the trochanters bilaterally and down the  iliotibial band bilaterally; however, worse on the right than on the left.  Otherwise, neurologically intact. No swelling is noted in the hands today at  the MCP, distal or proximal interphalangeal joints as well. No changes in  nail beds. The hands are warm without any cyanosis or erythema.   IMPRESSION:  Multiple pain complaints. Number one, abdominal pain, etiology  not really clear. Would like to complete the work up including GI work up,  urologic work up,  gynecologic work up to determine etiology for this. She  continues to have complaints of joint pain and swelling although this has  not been documented in the office. Regarding the swelling of the hands, she  does complain of pain and stiffness, worse in the morning. We will have her  continue to follow up with urology and wait for notes regarding this workup.  Will have her followed up in the physical therapy program for muscle  management, pain program, education and body mechanics, posture. Will have  them address the iliotibial band and right trochanter muscle imbalance about  the hip, have some ultrasound over the IT band as well. Regarding TMJ, we  will have her follow up with the orthodontist or possibly oral surgeon. I am  not sure if we will continue on oxycodone. I do not have a clear idea of  what the abdominal pain is that we are treating here. We need to pursue work  up regarding this. We will wean her down from Percocet. We will give her  10/650 one p.o. b.i.d. #60 and will start her on some ibuprofen in the  morning 600 mg 1 p.o. q.a.m. with food #30 given. We will see her back in a  month and awake further work up from various specialties to help [place] out in  determining etiology of this abdominal pain.           ______________________________  Brantley Stage, M.D.  DMK/MedQ  D:  05/07/2005 12:35:50  T:  05/07/2005 14:35:07  Job #:  [id number]

## 2010-10-13 NOTE — Assessment & Plan Note (Signed)
HISTORY OF PRESENT ILLNESS:  [facility].  I evaluated her history for 14 point review of systems.  #1 - Toni Collins comes in today relating to [place] that she has not had the  opportunity to meet up with Dr. Leonides Cave.  By her own words, she does think  this would be very beneficial, and again, we will go ahead and try to  facilitate this.  We have a different understanding from Dr. Maxwell Marion office  than the patient relates, as they stated they have been trying to get hold  of her, and she states that they have been missing phone calls.  I do not  have an issue with this.  I do think, however, it is warranted for continued  psychological assessment for best outcome position.   #2 - I will hold off the block until we get a good understanding of where we  are headed from a psychological perspective.  She states she does not wish  to harm herself or others.  She is appropriate to questioning, and  demonstrates no risk factors.  I do not believe that this is a Set designer.   #3 - Risk inventory performed.   #4 - Opioid consent in review of controlled substances.  She did go destroy  the Mepergan.  She brings her pill bottles in, and appears appropriate.  She  is taking 2-3 Oxycodone daily, and I am going to go ahead and drop her to  the 5 mg strength to monitor usage patterns and see if there is any change  in analgesic capacity as she relates and as I reinforce.  We do not want to  go too far up the controlled substance ladder.  In fact, we want to use the  multiple modality approach to improve pain perception, to her best outcome,  and minimize potential risk structure.   OBJECTIVE:  With chaperone visit, the patient's abdominal exam is unchanged.  No hepatosplenomegaly.  Tender.  Suprapubic discomfort.   IMPRESSION:  Vulvodynia, interstitial cystitis.   PLAN:  As outlined above.   DISCHARGE INSTRUCTIONS:  Discharge instructions  given.  Lifestyle  enhancements reviewed.  Will see her in follow up.  No barriers to  communications.  Review of medication and risk profile.       HH/MedQ  D:  03/28/2004 10:04:50  T:  03/28/2004 11:21:09  Job #:  [id number]

## 2010-10-13 NOTE — Assessment & Plan Note (Signed)
Toni Collins is a 45 year old married female who is being seen in our pain  and rehabilitative clinic for multiple pain complaints, including chronic  abdominal pain, etiology not entirely clear.   She also presents today with complaints of pain throughout her parascapular  region and into her low back and medial thigh and medial elbow area and has  persistence of hand pain as well in bilateral hands.   She was referred to a rheumatologist.  Apparently she has not seen the  rheumatologist yet.  She tells me this is scheduled.  She also was referred  to urology at the last visit.  That apparently is also scheduled, but she  has not been seen.   We are having rheumatology follow her for history of rheumatoid arthritis as  well as her bilateral hand pain.   For her chronic abdominal pain, I had set her up with Jamison Neighbor, M.D.,  for further evaluation for interstitial cystitis.  These two evaluations  have not been completed yet.   She tells me she ran out of her Percocet about 4-1/2 days ago and is  requesting further pain medications today.   She is known to have a hiatal hernia, otherwise she has recently had a  normal barium enema and normal upper GI.   She is also known to have endometriosis.   Her average pain, which is located in the multiple areas described above, is  about an average of 7 on a scale of 10.  She continues to work 45 to 60  hours a week.  She reports poor sleep.  She reports good relief with the  Percocet.   She continues to drive, is able to climb stairs.   She is independent with all of her self-care.   Admits to anxiety, depression.  Denies suicidal ideation.   Does report nausea and abdominal pain.   Past medical, social and family history unchanged since last visit.   PHYSICAL EXAMINATION:  VITAL SIGNS:  Blood pressure 125/61, pulse 100,  respirations 16, 100% saturated on room air.  GENERAL:  A thin adult female, not appearing in any  distress during our  interview.  She is smiling and affect is bright, appropriate, talkative.  She is oriented x3.  MUSCULOSKELETAL/NEUROLOGIC:  She can stand easily after being seated in the  room.  Her gait is normal.  She wears two-inch high high heels.  Her gait is  normal.  Normal heel-toe mechanics.  Normal base of support.  She has full  range of motion in her cervical spine.  She has full shoulder range of  motion but reports increased pain in the upper trapezius area.  She abducts  arms bilaterally.  Her Romberg test is negative.  Her tandem gait is normal.  Seated reflexes are 2+ throughout.  No abnormal tone is noted in the lower  extremities, no clonus is noted.  She has good strength throughout upper and  lower extremities.   She has multiple areas of tenderness, approximately four to five areas in  the upper trapezius and medial scapular areas.  She is tender in the medial  elbows, tender just inferior to the collar bone just inferior to the  clavicle bilaterally, and is tender over the medial knee.   IMPRESSION:  Chronic abdominal pain with multiple other pain complaints,  currently would like to rule out any rheumatologic etiology.  She does have  a follow-up appointment with a rheumatologist.  We are waiting for results  from that.  She also was set up to see Dr. Marcelyn Bruins for evaluation of  bladder as her etiology for her abdominal pain, and today I have also added  having her follow up with [facility]ne B[facility] at [facility], who is a  gynecologist who specializes in chronic pelvic pain.  Ms. Skiff was given  a prescription for Ultracet today one to two p.o. daily, #60 with one  refill.  Also have her take 600 mg of ibuprofen in the morning.  Morning is  her worst time for pain.  Will see her back in three months.  By that time  we should be able to get back some of these specialists' evaluations and get  a better handle on etiology for her abdominal pain and whether  or not she  has a rheumatologic condition.           ______________________________  Brantley Stage, M.D.     DMK/MedQ  D:  06/18/2005 13:48:59  T:  06/19/2005 08:28:15  Job #:  [id number]

## 2010-10-25 ENCOUNTER — Encounter: Payer: Self-pay | Admitting: Family Medicine

## 2010-10-25 ENCOUNTER — Ambulatory Visit (INDEPENDENT_AMBULATORY_CARE_PROVIDER_SITE_OTHER): Payer: PRIVATE HEALTH INSURANCE | Admitting: Family Medicine

## 2010-10-25 DIAGNOSIS — F411 Generalized anxiety disorder: Secondary | ICD-10-CM

## 2010-10-25 DIAGNOSIS — G894 Chronic pain syndrome: Secondary | ICD-10-CM

## 2010-10-25 DIAGNOSIS — F419 Anxiety disorder, unspecified: Secondary | ICD-10-CM

## 2010-10-25 MED ORDER — CLONAZEPAM 0.5 MG PO TABS: 0.5000 mg | ORAL_TABLET | Freq: Two times a day (BID) | ORAL | Status: DC | PRN

## 2010-10-25 MED ORDER — OXYCODONE-ACETAMINOPHEN 10-325 MG PO TABS: 1.0000 | ORAL_TABLET | Freq: Four times a day (QID) | ORAL | Status: DC | PRN

## 2010-10-25 NOTE — Patient Instructions (Signed)
Discontinue lorazepam. Start Clonazepam one twice daily. Consider starting back counseling.

## 2010-10-25 NOTE — Progress Notes (Signed)
  Subjective:    Patient ID: Toni Collins, female    DOB: Jun 07, 1965, 45 y.o.   MRN: [id number]  HPI Ongoing anxiety issues. Denies specific stressors. Using lorazepam without much improvement. Feels anxious frequently-no clear triggers. Remains on Abilify and sertraline 100 mg twice daily. Depression relatively stable. She is walking some for exercise as suggested previously. He had some loose stools after meals she feels related to anxiety. Denies any weight loss. No palpitations. No chest pain. No significant caffeine use  Chronic pain syndrome/fibromyalgia. She's been on multiple medications previously without success as previously outlined. She's been on oxycodone for several years with no history of misuse. Pain fairly well-controlled with this. No constipation issues   Review of Systems  Constitutional: Negative for chills, diaphoresis, appetite change and unexpected weight change.  Respiratory: Negative for cough and shortness of breath.   Cardiovascular: Negative for chest pain, palpitations and leg swelling.  Gastrointestinal: Negative for abdominal pain.  Neurological: Negative for headaches.       Objective:   Physical Exam  Constitutional: She is oriented to person, place, and time. She appears well-developed and well-nourished.  Neck: No thyromegaly present.  Cardiovascular: Normal rate, regular rhythm and normal heart sounds.   Pulmonary/Chest: Effort normal and breath sounds normal. No respiratory distress. She has no wheezes. She has no rales.  Lymphadenopathy:    She has no cervical adenopathy.  Neurological: She is alert and oriented to person, place, and time. No cranial nerve deficit.  Psychiatric: She has a normal mood and affect. Her behavior is normal.          Assessment & Plan:  Anxiety. Discontinue lorazepam. Clonazepam 0.5 mg twice daily. Continue sertraline. Start back counseling. Reassess one to 2 months Chronic pain syndrome.  Refilled her  oxycodone which she has been on for several years.

## 2010-11-14 ENCOUNTER — Other Ambulatory Visit: Payer: Self-pay | Admitting: Family Medicine

## 2010-11-14 DIAGNOSIS — F419 Anxiety disorder, unspecified: Secondary | ICD-10-CM

## 2010-11-14 MED ORDER — CLONAZEPAM 0.5 MG PO TABS: 0.5000 mg | ORAL_TABLET | Freq: Two times a day (BID) | ORAL | Status: DC | PRN

## 2010-11-14 NOTE — Telephone Encounter (Signed)
[facility] - rx is for 0.5mg  bid - refill request would be 11 days early, it looks like lorazepam was replaced with clonazepam -gave short term refill #15 until nancy/dr. burchette are back in office next week.   Called pt - discussed med and early refill - according to pt - dr. [name] started her on 0.5 bid but said it would be ok to increase to 1mg  bid if necessary -pt states higher dose really works better. Understands short term RF and that will need to call next week to discuss increase with nancy/dr. Burchette.Marland Kitchen

## 2010-11-14 NOTE — Telephone Encounter (Signed)
Pt is going to need refill of Clonazepam 1.0 mg bid. Pls  Call in to [facility]

## 2010-11-21 ENCOUNTER — Telehealth: Payer: Self-pay | Admitting: Family Medicine

## 2010-11-21 DIAGNOSIS — G894 Chronic pain syndrome: Secondary | ICD-10-CM

## 2010-11-21 NOTE — Telephone Encounter (Signed)
Refill Oxycodone by Friday at 10am.

## 2010-11-21 NOTE — Telephone Encounter (Signed)
OK to refill by Friday 

## 2010-11-23 ENCOUNTER — Other Ambulatory Visit: Payer: Self-pay | Admitting: *Deleted

## 2010-11-23 DIAGNOSIS — F419 Anxiety disorder, unspecified: Secondary | ICD-10-CM

## 2010-11-23 MED ORDER — OXYCODONE-ACETAMINOPHEN 10-325 MG PO TABS: 1.0000 | ORAL_TABLET | Freq: Four times a day (QID) | ORAL | Status: DC | PRN

## 2010-11-23 NOTE — Telephone Encounter (Signed)
Pt requesting refill Clonazepam, she is taking it in the AM and PM daily and finding it very helpful.  Pt was given #15 with 0 refills on 11/14/10 in Dr Burchette's absence. Pt requesting refill

## 2010-11-23 NOTE — Telephone Encounter (Signed)
Refill clonazepam one twice daily, #60 with 3 refills.

## 2010-11-24 MED ORDER — CLONAZEPAM 0.5 MG PO TABS: 0.5000 mg | ORAL_TABLET | Freq: Two times a day (BID) | ORAL | Status: AC | PRN

## 2010-12-11 ENCOUNTER — Other Ambulatory Visit: Payer: Self-pay | Admitting: Family Medicine

## 2010-12-19 ENCOUNTER — Telehealth: Payer: Self-pay | Admitting: Family Medicine

## 2010-12-19 DIAGNOSIS — G894 Chronic pain syndrome: Secondary | ICD-10-CM

## 2010-12-19 NOTE — Telephone Encounter (Signed)
Refill Oxycodone by Friday. Thanks

## 2010-12-21 MED ORDER — OXYCODONE-ACETAMINOPHEN 10-325 MG PO TABS: 1.0000 | ORAL_TABLET | Freq: Four times a day (QID) | ORAL | Status: DC | PRN

## 2010-12-21 NOTE — Telephone Encounter (Signed)
Pt informed Rx ready for pick up. 

## 2011-01-17 ENCOUNTER — Telehealth: Payer: Self-pay | Admitting: Family Medicine

## 2011-01-17 DIAGNOSIS — G894 Chronic pain syndrome: Secondary | ICD-10-CM

## 2011-01-17 NOTE — Telephone Encounter (Signed)
Pt called 8/22. Would like to pick up a new Rx on her Oxycodone this Friday between 3-5. Please call when ready.

## 2011-01-18 NOTE — Telephone Encounter (Signed)
Last filled #120 on 7/26

## 2011-01-18 NOTE — Telephone Encounter (Signed)
OK to refill

## 2011-01-19 MED ORDER — OXYCODONE-ACETAMINOPHEN 10-325 MG PO TABS: 1.0000 | ORAL_TABLET | Freq: Four times a day (QID) | ORAL | Status: DC | PRN

## 2011-01-19 NOTE — Telephone Encounter (Signed)
Pt informed ready 

## 2011-01-26 ENCOUNTER — Encounter: Payer: Self-pay | Admitting: Family Medicine

## 2011-01-26 ENCOUNTER — Ambulatory Visit (INDEPENDENT_AMBULATORY_CARE_PROVIDER_SITE_OTHER): Payer: PRIVATE HEALTH INSURANCE | Admitting: Family Medicine

## 2011-01-26 VITALS — BP 102/68 | Temp 98.6°F | Wt 148.0 lb

## 2011-01-26 DIAGNOSIS — R609 Edema, unspecified: Secondary | ICD-10-CM

## 2011-01-26 DIAGNOSIS — R600 Localized edema: Secondary | ICD-10-CM | POA: Insufficient documentation

## 2011-01-26 LAB — POCT URINALYSIS DIPSTICK
Blood, UA: NEGATIVE
Protein, UA: NEGATIVE
Spec Grav, UA: 1.005
Urobilinogen, UA: 0.2
pH, UA: 6.5

## 2011-01-26 LAB — BASIC METABOLIC PANEL
CO2: 28 mEq/L (ref 19–32)
Calcium: 9.2 mg/dL (ref 8.4–10.5)
Glucose, Bld: 103 mg/dL — ABNORMAL HIGH (ref 70–99)
Sodium: 140 mEq/L (ref 135–145)

## 2011-01-26 LAB — HEPATIC FUNCTION PANEL
ALT: 34 U/L (ref 0–35)
AST: 29 U/L (ref 0–37)
Albumin: 4.3 g/dL (ref 3.5–5.2)
Alkaline Phosphatase: 65 U/L (ref 39–117)
Total Protein: 7.5 g/dL (ref 6.0–8.3)

## 2011-01-26 MED ORDER — HYDROCHLOROTHIAZIDE 12.5 MG PO CAPS: 12.5000 mg | ORAL_CAPSULE | Freq: Every day | ORAL | Status: DC

## 2011-01-26 NOTE — Patient Instructions (Signed)
Elevate legs frequently Reduce sodium intake Goal would be < 3,000 mg per day

## 2011-01-26 NOTE — Progress Notes (Signed)
Quick Note:  Pt informed ______ 

## 2011-01-26 NOTE — Progress Notes (Signed)
  Subjective:    Patient ID: Toni Collins, female    DOB: [birth year], 45 y.o.   MRN: [id number]  HPI Bilateral ankle and leg edema. Noted about one week ago. Weight up about 3 pounds. Does have some mild varicosities which are unchanged. No recent dyspnea. No recent lifestyle changes. Still walks some for exercise. No nonsteroidal use. No orthopnea. She has history of depression, fibromyalgia, chronic insomnia, prediabetes, and chronic pain syndrome. Current medications reviewed. She is compliant with all. She notices her edema is worse when she has been up on her legs most of the day. Better first thing in the morning.  Past Medical History  Diagnosis Date  . INSOMNIA, CHRONIC 09/02/2008  . GRIEF REACTION 09/30/2009  . DEPRESSION 09/02/2008  . PREDIABETES 03/10/2009  . Colon polyps   . Fibromyalgia    Past Surgical History  Procedure Date  . Abdominal hysterectomy 2002    TAH  . Tmj arthroplasty     x2  . Diagnostic laparoscopy 1993, 1994, 1998, 2000    x4    reports that she has never smoked. She does not have any smokeless tobacco history on file. She reports that she does not drink alcohol. Her drug history not on file. family history includes Arthritis in her mother.  There is no history of Depression. No Known Allergies    Review of Systems  Constitutional: Positive for unexpected weight change. Negative for fever and chills.  Respiratory: Negative for cough, shortness of breath and wheezing.   Cardiovascular: Positive for leg swelling. Negative for chest pain and palpitations.  Gastrointestinal: Negative for abdominal pain.  Genitourinary: Negative for dysuria.  Skin: Negative for rash.  Neurological: Negative for dizziness.  Hematological: Negative for adenopathy.       Objective:   Physical Exam  Constitutional: She appears well-developed and well-nourished. No distress.  Neck: Neck supple. No thyromegaly present.  Cardiovascular: Normal rate, regular rhythm and  normal heart sounds.   Pulmonary/Chest: Effort normal and breath sounds normal. No respiratory distress. She has no wheezes. She has no rales.  Musculoskeletal: She exhibits edema.       Patient has some nonpitting edema symmetric involving ankles and legs bilaterally  Lymphadenopathy:    She has no cervical adenopathy.  Psychiatric: She has a normal mood and affect. Her behavior is normal.          Assessment & Plan:  Bilateral leg edema. Suspect related to venous stasis. No clear exacerbating factors. Check labs with urine dipstick, TSH, basic metabolic panel, and hepatic panel. Low dose HCTZ 12.5 mg to use once daily as needed for severe edema. Frequent elevation. Consider knee-high compression garments if labs all normal. Reduce sodium to no more than 3 g daily. Reassess 3 weeks

## 2011-02-16 ENCOUNTER — Ambulatory Visit (INDEPENDENT_AMBULATORY_CARE_PROVIDER_SITE_OTHER): Payer: PRIVATE HEALTH INSURANCE | Admitting: Family Medicine

## 2011-02-16 ENCOUNTER — Encounter: Payer: Self-pay | Admitting: Family Medicine

## 2011-02-16 VITALS — BP 110/84 | Temp 98.5°F | Wt 150.0 lb

## 2011-02-16 DIAGNOSIS — G894 Chronic pain syndrome: Secondary | ICD-10-CM

## 2011-02-16 DIAGNOSIS — R609 Edema, unspecified: Secondary | ICD-10-CM

## 2011-02-16 DIAGNOSIS — Z23 Encounter for immunization: Secondary | ICD-10-CM

## 2011-02-16 DIAGNOSIS — R600 Localized edema: Secondary | ICD-10-CM

## 2011-02-16 MED ORDER — ZOLPIDEM TARTRATE 10 MG PO TABS: ORAL_TABLET | ORAL | Status: DC

## 2011-02-16 MED ORDER — OXYCODONE-ACETAMINOPHEN 10-325 MG PO TABS: 1.0000 | ORAL_TABLET | Freq: Four times a day (QID) | ORAL | Status: DC | PRN

## 2011-02-16 NOTE — Progress Notes (Signed)
  Subjective:    Patient ID: Toni Collins, female    DOB: 09-14-1965, 45 y.o.   MRN: [id number]  HPI Followup leg edema. Refer to prior note. She has had some recent weight gain. Lab assessment was unremarkable. Prescribed HCTZ which hasnot used. Not exercising consistently. Never filled prescription for compression garments. No increase in edema and in fact she thinks this is slightly better. Consistently better after leg elevation/sleep. No dyspnea. No orthopnea. No dietary changes. No nonsteroidal use.   Review of Systems  Constitutional: Negative for unexpected weight change.  Respiratory: Negative for cough and shortness of breath.   Cardiovascular: Negative for chest pain and palpitations.  Neurological: Negative for headaches.       Objective:   Physical Exam  Constitutional: She appears well-developed and well-nourished.  Cardiovascular: Normal rate and regular rhythm.   Pulmonary/Chest: Effort normal and breath sounds normal. No respiratory distress. She has no wheezes. She has no rales.  Musculoskeletal:       She has trace nonpitting edema legs feet and ankles bilaterally. She has some varicosities both legs. Feet reveal excellent pulses          Assessment & Plan:  #1 leg edema. Stable. Lab assessment unremarkable. Suspect this is related to venous disease. Work on weight loss, frequent elevation, sodium reduction, regular exercise, and compression garments. #2 chronic pain syndrome. Refilled her oxycodone for one month

## 2011-02-16 NOTE — Patient Instructions (Signed)
Recommend the following:  Weight loss, walking for exercise, sodium reduction, and consider compression garments.

## 2011-02-19 ENCOUNTER — Ambulatory Visit: Payer: PRIVATE HEALTH INSURANCE | Admitting: Family Medicine

## 2011-03-14 ENCOUNTER — Other Ambulatory Visit: Payer: Self-pay | Admitting: Family Medicine

## 2011-03-14 DIAGNOSIS — G894 Chronic pain syndrome: Secondary | ICD-10-CM

## 2011-03-14 NOTE — Telephone Encounter (Signed)
Pt called req script for oxyCODONE-acetaminophen (PERCOCET) 10-325 MG per tablet for pick up on Friday 03/16/11 at 2pm. Pt is leaving to go out of town on Friday afternoon.

## 2011-03-15 NOTE — Telephone Encounter (Signed)
Last filled 02/16/11, #120 with 0 refills

## 2011-03-15 NOTE — Telephone Encounter (Signed)
OK to refill tomorrow.

## 2011-03-16 MED ORDER — OXYCODONE-ACETAMINOPHEN 10-325 MG PO TABS: 1.0000 | ORAL_TABLET | Freq: Four times a day (QID) | ORAL | Status: DC | PRN

## 2011-04-11 ENCOUNTER — Other Ambulatory Visit: Payer: Self-pay | Admitting: Family Medicine

## 2011-04-11 DIAGNOSIS — G894 Chronic pain syndrome: Secondary | ICD-10-CM

## 2011-04-11 NOTE — Telephone Encounter (Signed)
Pt requesting refill on oxyCODONE-acetaminophen (PERCOCET) 10-325 MG per tablet

## 2011-04-12 MED ORDER — OXYCODONE-ACETAMINOPHEN 10-325 MG PO TABS: 1.0000 | ORAL_TABLET | Freq: Four times a day (QID) | ORAL | Status: DC | PRN

## 2011-04-12 NOTE — Telephone Encounter (Signed)
60

## 2011-04-12 NOTE — Telephone Encounter (Signed)
Oxycodone 10-325 refill requst, 1 tab every 6 hour prn pain Last filled 03-16-11, #120 with 0 refills Dr [name] pt, out of office, returning Tues. 11/20

## 2011-04-12 NOTE — Telephone Encounter (Signed)
Pt informed ready for pick-up, #60 only per Dr Amador Cunas

## 2011-04-27 ENCOUNTER — Telehealth: Payer: Self-pay | Admitting: Family Medicine

## 2011-04-27 NOTE — Telephone Encounter (Signed)
Pt requesting refill on oxyCODONE-acetaminophen (PERCOCET) 10-325 MG per tablet  Pt requesting to pick up on Tuesday

## 2011-04-27 NOTE — Telephone Encounter (Signed)
Sig: One tab ever 6 hours prn pain Last filled 04/12/11, #60 with 0 refills

## 2011-04-30 ENCOUNTER — Ambulatory Visit (INDEPENDENT_AMBULATORY_CARE_PROVIDER_SITE_OTHER): Payer: PRIVATE HEALTH INSURANCE | Admitting: Family Medicine

## 2011-04-30 ENCOUNTER — Encounter: Payer: Self-pay | Admitting: Family Medicine

## 2011-04-30 VITALS — BP 100/70 | Temp 98.8°F | Wt 150.0 lb

## 2011-04-30 DIAGNOSIS — F329 Major depressive disorder, single episode, unspecified: Secondary | ICD-10-CM

## 2011-04-30 DIAGNOSIS — G894 Chronic pain syndrome: Secondary | ICD-10-CM

## 2011-04-30 DIAGNOSIS — F3289 Other specified depressive episodes: Secondary | ICD-10-CM

## 2011-04-30 MED ORDER — OXYCODONE-ACETAMINOPHEN 10-325 MG PO TABS: 1.0000 | ORAL_TABLET | Freq: Four times a day (QID) | ORAL | Status: DC | PRN

## 2011-04-30 NOTE — Telephone Encounter (Signed)
Refilled at office visit today.

## 2011-04-30 NOTE — Progress Notes (Signed)
  Subjective:    Patient ID: Toni Collins, female    DOB: [birth year], 45 y.o.   MRN: [id number]  HPI  Patient seen to discuss depression issues. Long history of recurrent depression. Currently treated sertraline 100 mg daily and Abilify 10 mg daily. Loss of father last year. Currently undergoing bankruptcy procedures. Denies suicidal ideation. Supportive husband. Denies other stressors at this time. Currently working full time. Compliant with medications. Previously has had an insurance that did not cover her for counseling.  Insurance change in January and she hopes to pursue some counseling them.   Review of Systems  Constitutional: Negative for appetite change and unexpected weight change.  Respiratory: Negative for shortness of breath.   Cardiovascular: Negative for chest pain.  Psychiatric/Behavioral: Positive for dysphoric mood. Negative for suicidal ideas, confusion, decreased concentration and agitation.       Objective:   Physical Exam  Constitutional: She is oriented to person, place, and time. She appears well-developed and well-nourished. No distress.  Cardiovascular: Normal rate, regular rhythm and normal heart sounds.   Pulmonary/Chest: Effort normal and breath sounds normal. No respiratory distress. She has no wheezes. She has no rales.  Musculoskeletal: She exhibits no edema.  Neurological: She is alert and oriented to person, place, and time.  Psychiatric: She has a normal mood and affect. Her behavior is normal.          Assessment & Plan:  Recurrent/chronic depression. Discussed options. At this point she is reluctant to try other/additional medications. Overall she feels her depression is relatively stable. We have discussed initiating counseling services first of the year.

## 2011-05-24 ENCOUNTER — Other Ambulatory Visit: Payer: Self-pay | Admitting: Family Medicine

## 2011-05-24 DIAGNOSIS — G894 Chronic pain syndrome: Secondary | ICD-10-CM

## 2011-05-24 NOTE — Telephone Encounter (Signed)
Pt called and is req to pick up her refill for oxyCODONE-acetaminophen (PERCOCET) 10-325 MG per tablet on Monday 05/28/11. Pt leaving to go out of town next wk.

## 2011-05-24 NOTE — Telephone Encounter (Signed)
Last filled 04-30-11, #120 with 0 refills

## 2011-05-27 NOTE — Telephone Encounter (Signed)
May refill but notate next refill not due until Feb 3.

## 2011-05-28 MED ORDER — OXYCODONE-ACETAMINOPHEN 10-325 MG PO TABS: 1.0000 | ORAL_TABLET | Freq: Four times a day (QID) | ORAL | Status: DC | PRN

## 2011-05-28 NOTE — Telephone Encounter (Signed)
Rx printed, signed, pt aware ready for pick-up

## 2011-05-28 NOTE — Telephone Encounter (Signed)
Pt is going out of town today

## 2011-05-30 ENCOUNTER — Ambulatory Visit: Payer: PRIVATE HEALTH INSURANCE | Admitting: Family Medicine

## 2011-06-29 ENCOUNTER — Encounter: Payer: Self-pay | Admitting: Family Medicine

## 2011-06-29 ENCOUNTER — Ambulatory Visit (INDEPENDENT_AMBULATORY_CARE_PROVIDER_SITE_OTHER): Payer: PRIVATE HEALTH INSURANCE | Admitting: Family Medicine

## 2011-06-29 VITALS — BP 120/70 | Temp 98.0°F | Wt 157.0 lb

## 2011-06-29 DIAGNOSIS — R7303 Prediabetes: Secondary | ICD-10-CM | POA: Insufficient documentation

## 2011-06-29 DIAGNOSIS — G894 Chronic pain syndrome: Secondary | ICD-10-CM

## 2011-06-29 DIAGNOSIS — G4700 Insomnia, unspecified: Secondary | ICD-10-CM

## 2011-06-29 DIAGNOSIS — R600 Localized edema: Secondary | ICD-10-CM

## 2011-06-29 DIAGNOSIS — R7309 Other abnormal glucose: Secondary | ICD-10-CM

## 2011-06-29 DIAGNOSIS — R609 Edema, unspecified: Secondary | ICD-10-CM

## 2011-06-29 MED ORDER — OXYCODONE-ACETAMINOPHEN 10-325 MG PO TABS: 1.0000 | ORAL_TABLET | Freq: Four times a day (QID) | ORAL | Status: DC | PRN

## 2011-06-29 NOTE — Progress Notes (Signed)
  Subjective:    Patient ID: Toni Collins, female    DOB: February 21, 1966, 46 y.o.   MRN: [id number]  HPI  Patient seen for medical followup. She has a long history of recurrent depression, chronic pain syndrome, fibromyalgia, and intermittent lower leg edema. Edema has been controlled recently. Rarely takes HCTZ. Depression treated with sertraline and Abilify. Depression currently stable. No suicidal ideation. She has history of chronic anxiety which is fairly well-controlled.  Has chronic pain syndrome. She's had chronic upper and lower joint pain has been on opioids for many years no history of misuse. No constipation issues. History of mild hyperlipidemia and prediabetes. She's gained some weight recently. Has just started [employer]. Not consistently exercising.  Past Medical History  Diagnosis Date  . INSOMNIA, CHRONIC 09/02/2008  . GRIEF REACTION 09/30/2009  . DEPRESSION 09/02/2008  . PREDIABETES 03/10/2009  . Colon polyps   . Fibromyalgia    Past Surgical History  Procedure Date  . Abdominal hysterectomy 2002    TAH  . Tmj arthroplasty     x2  . Diagnostic laparoscopy 1993, 1994, 1998, 2000    x4    reports that she has never smoked. She does not have any smokeless tobacco history on file. She reports that she does not drink alcohol. Her drug history not on file. family history includes Arthritis in her mother and Diabetes in her maternal grandmother.  There is no history of Depression. No Known Allergies    Review of Systems  Constitutional: Positive for unexpected weight change. Negative for chills and fatigue.  Respiratory: Negative for cough and shortness of breath.   Cardiovascular: Negative for chest pain, palpitations and leg swelling.  Gastrointestinal: Negative for abdominal pain.  Genitourinary: Negative for dysuria.  Neurological: Negative for dizziness and headaches.  Psychiatric/Behavioral: Negative for confusion and dysphoric mood.       Objective:   Physical Exam  Constitutional: She appears well-developed and well-nourished.  HENT:  Mouth/Throat: Oropharynx is clear and moist.  Neck: Neck supple. No thyromegaly present.  Cardiovascular: Normal rate and regular rhythm.   Pulmonary/Chest: Effort normal and breath sounds normal. No respiratory distress. She has no wheezes. She has no rales.  Musculoskeletal: She exhibits no edema.  Lymphadenopathy:    She has no cervical adenopathy.          Assessment & Plan:  #1 history of chronic and recurrent depression stable continue current medications  #2 chronic pain syndrome. Refill Percocet for one month  #3 intermittent peripheral edema currently stable. #4 obesity. Needs to work on weight loss. She has joined [employer]. Followup in 6 months repeat lipid and fasting glucose

## 2011-07-25 ENCOUNTER — Telehealth: Payer: Self-pay | Admitting: Family Medicine

## 2011-07-25 ENCOUNTER — Other Ambulatory Visit: Payer: Self-pay | Admitting: Family Medicine

## 2011-07-25 DIAGNOSIS — G894 Chronic pain syndrome: Secondary | ICD-10-CM

## 2011-07-25 NOTE — Telephone Encounter (Signed)
Patient called stating that she would to pick up an rx refill for her percocet. Please assist and inform patient when available.

## 2011-07-25 NOTE — Telephone Encounter (Signed)
Last filled 2/1, #120 with 0 refills

## 2011-07-26 NOTE — Telephone Encounter (Signed)
Refill tomorrow OK

## 2011-07-27 MED ORDER — OXYCODONE-ACETAMINOPHEN 10-325 MG PO TABS: 1.0000 | ORAL_TABLET | Freq: Four times a day (QID) | ORAL | Status: DC | PRN

## 2011-07-27 NOTE — Telephone Encounter (Signed)
Pt informed ready 

## 2011-07-27 NOTE — Telephone Encounter (Signed)
Addended by: Melchor Amour on: 07/27/2011 09:29 AM   Modules accepted: Orders

## 2011-08-23 ENCOUNTER — Ambulatory Visit (INDEPENDENT_AMBULATORY_CARE_PROVIDER_SITE_OTHER): Payer: PRIVATE HEALTH INSURANCE | Admitting: Family Medicine

## 2011-08-23 ENCOUNTER — Ambulatory Visit: Payer: PRIVATE HEALTH INSURANCE | Admitting: Family Medicine

## 2011-08-23 ENCOUNTER — Encounter: Payer: Self-pay | Admitting: Family Medicine

## 2011-08-23 VITALS — BP 120/88 | Temp 98.4°F | Wt 158.0 lb

## 2011-08-23 DIAGNOSIS — R609 Edema, unspecified: Secondary | ICD-10-CM

## 2011-08-23 DIAGNOSIS — R600 Localized edema: Secondary | ICD-10-CM

## 2011-08-23 DIAGNOSIS — G4700 Insomnia, unspecified: Secondary | ICD-10-CM

## 2011-08-23 DIAGNOSIS — G894 Chronic pain syndrome: Secondary | ICD-10-CM

## 2011-08-23 MED ORDER — OXYCODONE-ACETAMINOPHEN 10-325 MG PO TABS: 1.0000 | ORAL_TABLET | Freq: Four times a day (QID) | ORAL | Status: DC | PRN

## 2011-08-23 MED ORDER — ZOLPIDEM TARTRATE 10 MG PO TABS: ORAL_TABLET | ORAL | Status: DC

## 2011-08-25 NOTE — Progress Notes (Signed)
  Subjective:    Patient ID: Toni Collins, female    DOB: 1965/07/22, 46 y.o.   MRN: [id number]  HPI   Patient is seen today for medical follow up.  Her chronic problems include history of depression, chronic pain syndrome, fibromyalgia, prediabetes, and chronic insomnia. She's had previous with bilateral leg edema improved with HCTZ. Currently not taking that medication. Depression is stable on sertraline and Abilify. Patient would like to consider counseling. She still has some depressive symptoms off and on an intermittent issues with anxiety. No suicidal ideation.  She's been treated with chronic opioids for several years and denies any recent complications. She's been on many other treatments including gabapentin, Lyrica, tricyclic antidepressants, muscle relaxers, and nonsteroidals without relief.  Past Medical History  Diagnosis Date  . INSOMNIA, CHRONIC 09/02/2008  . GRIEF REACTION 09/30/2009  . DEPRESSION 09/02/2008  . PREDIABETES 03/10/2009  . Colon polyps   . Fibromyalgia    Past Surgical History  Procedure Date  . Abdominal hysterectomy 2002    TAH  . Tmj arthroplasty     x2  . Diagnostic laparoscopy 1993, 1994, 1998, 2000    x4    reports that she has never smoked. She does not have any smokeless tobacco history on file. She reports that she does not drink alcohol. Her drug history not on file. family history includes Arthritis in her mother and Diabetes in her maternal grandmother.  There is no history of Depression. No Known Allergies      Review of Systems  Constitutional: Negative for fever and chills.  Respiratory: Negative for cough and shortness of breath.   Cardiovascular: Negative for chest pain, palpitations and leg swelling.  Gastrointestinal: Negative for abdominal pain.  Musculoskeletal: Positive for myalgias. Negative for joint swelling.  Neurological: Negative for dizziness.  Hematological: Negative for adenopathy.       Objective:   Physical Exam  Constitutional: She is oriented to person, place, and time. She appears well-developed and well-nourished.  HENT:  Mouth/Throat: Oropharynx is clear and moist.  Neck: Neck supple. No thyromegaly present.  Cardiovascular: Normal rate and regular rhythm.   Pulmonary/Chest: Effort normal and breath sounds normal. No respiratory distress. She has no wheezes. She has no rales.  Musculoskeletal: She exhibits no edema.  Neurological: She is alert and oriented to person, place, and time.  Skin: No rash noted.  Psychiatric: She has a normal mood and affect. Her behavior is normal. Judgment and thought content normal.          Assessment & Plan:  #1 fibromyalgia. Chronic pain related to this. We have discussed importance of establishing more consistent aerobic exercise. Refill pain medication for one month #2 chronic insomnia. Continue as needed use of Ambien.  #3 history of bilateral leg edema currently stable.  Only use hctz as needed.

## 2011-09-21 ENCOUNTER — Encounter: Payer: Self-pay | Admitting: Family Medicine

## 2011-09-21 ENCOUNTER — Ambulatory Visit (INDEPENDENT_AMBULATORY_CARE_PROVIDER_SITE_OTHER): Payer: PRIVATE HEALTH INSURANCE | Admitting: Family Medicine

## 2011-09-21 VITALS — BP 102/72 | Temp 97.8°F | Wt 165.0 lb

## 2011-09-21 DIAGNOSIS — D1800 Hemangioma unspecified site: Secondary | ICD-10-CM

## 2011-09-21 DIAGNOSIS — G894 Chronic pain syndrome: Secondary | ICD-10-CM

## 2011-09-21 MED ORDER — OXYCODONE-ACETAMINOPHEN 10-325 MG PO TABS: 1.0000 | ORAL_TABLET | Freq: Four times a day (QID) | ORAL | Status: DC | PRN

## 2011-09-21 NOTE — Patient Instructions (Signed)
Keep wound dry for the first 24 hours then clean daily with soap and water for one week. Apply topical antibiotic daily for 3-4 days. Keep covered with clean dressing for 4-5 days. Follow up promptly for any signs of infection such as redness, warmth, pain, or drainage.  

## 2011-09-21 NOTE — Progress Notes (Signed)
  Subjective:    Patient ID: Toni Collins, female    DOB: 04-15-1966, 46 y.o.   MRN: [id number]  HPI  Patient has skin lesion mid thoracic back which is recently irritated and has bled a couple of occasions. She is requesting excision. She's never had any personal or family history of skin cancer.  Patient also requesting refill for chronic pain medication. She's been on oxycodone for several years for pain disorder as previously outlined. She's had intolerance or poor control with many other medications. No history of misuse. No recent constipation issues.   Review of Systems  Constitutional: Negative for appetite change and unexpected weight change.       Objective:   Physical Exam  Constitutional: She appears well-developed and well-nourished.  Cardiovascular: Normal rate and regular rhythm.   Pulmonary/Chest: Effort normal and breath sounds normal. No respiratory distress. She has no wheezes. She has no rales.  Skin:       Patient has reddish colored well-demarcated growth which is about 4 mm diameter mid thoracic region. This has features of typical hemangioma          Assessment & Plan:  Skin lesion, back. Clinically, suspect hemangioma. Patient requesting excision because of frequent irritation. Discussed risk and benefits of shave excision and patient consented. Skin prepped with Betadine. Anesthetized 1% Xylocaine with epinephrine. Shave excision with #15 blade. Minimal bleeding controlled with silver nitrate. Antibiotic and bandage applied. Wound care instruction given. Chronic pain syndrome- refilled Oxycodone.

## 2011-09-24 ENCOUNTER — Ambulatory Visit: Payer: PRIVATE HEALTH INSURANCE | Admitting: Family Medicine

## 2011-10-19 ENCOUNTER — Ambulatory Visit (INDEPENDENT_AMBULATORY_CARE_PROVIDER_SITE_OTHER): Payer: PRIVATE HEALTH INSURANCE | Admitting: Family Medicine

## 2011-10-19 ENCOUNTER — Encounter: Payer: Self-pay | Admitting: Family Medicine

## 2011-10-19 VITALS — BP 102/70 | Temp 98.5°F | Wt 158.0 lb

## 2011-10-19 DIAGNOSIS — G894 Chronic pain syndrome: Secondary | ICD-10-CM

## 2011-10-19 DIAGNOSIS — D1800 Hemangioma unspecified site: Secondary | ICD-10-CM

## 2011-10-19 MED ORDER — OXYCODONE-ACETAMINOPHEN 10-325 MG PO TABS: 1.0000 | ORAL_TABLET | Freq: Four times a day (QID) | ORAL | Status: DC | PRN

## 2011-10-19 NOTE — Patient Instructions (Signed)
Keep wound dry for the first 24 hours then clean daily with soap and water for one week. Apply topical antibiotic daily for 3-4 days. Keep covered with clean dressing for 4-5 days. Follow up promptly for any signs of infection such as redness, warmth, pain, or drainage.  

## 2011-10-19 NOTE — Progress Notes (Signed)
  Subjective:    Patient ID: Toni Collins, female    DOB: Mar 15, 1966, 46 y.o.   MRN: [id number]  HPI  Patient here questing excision right facial skin lesion. Present for several years not changing. She has angioma type lesion underneath right eye. No itching or bleeding. No personal history of skin cancer. No family history of skin cancer. We recently removed mole back and this has done well.   Review of Systems  Constitutional: Negative for appetite change.  Respiratory: Negative for shortness of breath.   Cardiovascular: Negative for chest pain.       Objective:   Physical Exam  Constitutional: She appears well-developed and well-nourished.  Cardiovascular: Normal rate and regular rhythm.   Pulmonary/Chest: Effort normal and breath sounds normal. No respiratory distress. She has no wheezes. She has no rales.  Skin:       Patient has well demarcated skin lesion about 6 mm diameter which is raised well demarcated and symmetric. Blanches with pressure.          Assessment & Plan:  Angioma right face. Patient requesting excision. We discussed risk and benefits including risk of infection and scarring as well as potential pigment change following excision. Patient consents. Prepped skin with alcohol. Anesthesia 1% Xylocaine with epinephrine. Removed by shave excision with #15 blade. Minimal bleeding controlled with Drysol. Antibiotic and dressing applied. Wound care instruction given. Specimen sent to pathology

## 2011-11-09 ENCOUNTER — Telehealth: Payer: Self-pay | Admitting: Family Medicine

## 2011-11-09 MED ORDER — LORAZEPAM 0.5 MG PO TABS: 0.5000 mg | ORAL_TABLET | Freq: Four times a day (QID) | ORAL | Status: AC | PRN

## 2011-11-09 NOTE — Telephone Encounter (Signed)
Pt called and said that she is having a real problem with anxiety issues and crying a lot. Pt is wondering is she can get refill of LORazepam (ATIVAN) 0.5 MG tablet for severe anxiety. [facility] [place].

## 2011-11-09 NOTE — Telephone Encounter (Signed)
Patient is aware and Rx called in. 

## 2011-11-09 NOTE — Telephone Encounter (Signed)
We can use short term for severe anxiety but recommend against regular use.  May refill Lorazepam 0.5 mg one po q 6 hours prn severe anxiety #30 with no refills.  If depression remains poorly controlled, I would recommend consultation with psychiatrist.

## 2011-11-16 ENCOUNTER — Ambulatory Visit (INDEPENDENT_AMBULATORY_CARE_PROVIDER_SITE_OTHER): Payer: PRIVATE HEALTH INSURANCE | Admitting: Family Medicine

## 2011-11-16 ENCOUNTER — Encounter: Payer: Self-pay | Admitting: Family Medicine

## 2011-11-16 VITALS — BP 122/80 | Temp 98.2°F | Wt 163.0 lb

## 2011-11-16 DIAGNOSIS — G894 Chronic pain syndrome: Secondary | ICD-10-CM

## 2011-11-16 DIAGNOSIS — M797 Fibromyalgia: Secondary | ICD-10-CM

## 2011-11-16 DIAGNOSIS — D229 Melanocytic nevi, unspecified: Secondary | ICD-10-CM

## 2011-11-16 DIAGNOSIS — IMO0001 Reserved for inherently not codable concepts without codable children: Secondary | ICD-10-CM

## 2011-11-16 DIAGNOSIS — D239 Other benign neoplasm of skin, unspecified: Secondary | ICD-10-CM

## 2011-11-16 MED ORDER — OXYCODONE-ACETAMINOPHEN 10-325 MG PO TABS: 1.0000 | ORAL_TABLET | Freq: Four times a day (QID) | ORAL | Status: DC | PRN

## 2011-11-16 MED ORDER — PREGABALIN 75 MG PO CAPS: 75.0000 mg | ORAL_CAPSULE | Freq: Two times a day (BID) | ORAL | Status: DC

## 2011-11-16 NOTE — Patient Instructions (Addendum)
Keep wound dry for the first 24 hours then clean daily with soap and water for one week. Apply topical antibiotic daily for 3-4 days. Keep covered with clean dressing for 4-5 days. Follow up promptly for any signs of infection such as redness, warmth, pain, or drainage.  

## 2011-11-16 NOTE — Progress Notes (Signed)
  Subjective:    Patient ID: Toni Collins, female    DOB: 1965-08-28, 46 y.o.   MRN: [id number]  HPI  Patient is here to discuss the following  She has 3 nevi back area which would like to have removed because of irritation with rubbing against  clothing. She has not had any recent rapid growth or other changes. No itching or bleeding.  History of fibromyalgia. She's been chronically on opioid pain medications as been on many other treatments previously which have not seemed to help. She would like to consider trial of Lyrica once again. She does not recall any previous adverse side effects. Patient is interested in trying to taper back oxycodone.   Review of Systems  Constitutional: Negative for fever, chills, appetite change, fatigue and unexpected weight change.  Musculoskeletal: Positive for arthralgias.       Objective:   Physical Exam  Constitutional: She appears well-developed and well-nourished.  Cardiovascular: Normal rate and regular rhythm.   Pulmonary/Chest: Effort normal and breath sounds normal. No respiratory distress. She has no wheezes. She has no rales.  Skin:       Patient has 3 nevi upper to lower thoracic region. These all have well demarcated border. Largest is most inferior location and about 4 mm diameter. No major pigmentary change. No atypical features. Good symmetry.          Assessment & Plan:  #1 benign appearing nevi back.. We discussed risk and benefits of nevus excision with patient including risk of scarring, infection, bleeding and patient consents. Prepped back with Betadine anesthesia 1% Xylocaine with epinephrine. Shave excision #15 blade of all three and patient tolerated well. Minimal bleeding controlled with silver nitrate. Antibiotic and dressing applied  #2 fibromyalgia. Chronic pain syndrome. lyrica 75 mg twice a day and if responding well, we will try to taper her off Percocet eventually.

## 2011-11-20 NOTE — Progress Notes (Signed)
Quick Note:  Pt informed ______ 

## 2011-12-13 ENCOUNTER — Telehealth: Payer: Self-pay | Admitting: Family Medicine

## 2011-12-13 DIAGNOSIS — G894 Chronic pain syndrome: Secondary | ICD-10-CM

## 2011-12-13 NOTE — Telephone Encounter (Signed)
Last filled 11-16-11, #120 with 0 refills

## 2011-12-13 NOTE — Telephone Encounter (Signed)
Pt needs new rx percocet °

## 2011-12-13 NOTE — Telephone Encounter (Signed)
May refill by December 14, 2011.

## 2011-12-14 MED ORDER — OXYCODONE-ACETAMINOPHEN 10-325 MG PO TABS: 1.0000 | ORAL_TABLET | Freq: Four times a day (QID) | ORAL | Status: DC | PRN

## 2011-12-14 NOTE — Telephone Encounter (Signed)
Pt informed Rx ready to pick up

## 2011-12-22 ENCOUNTER — Other Ambulatory Visit: Payer: Self-pay | Admitting: Family Medicine

## 2012-01-10 ENCOUNTER — Telehealth: Payer: Self-pay | Admitting: Family Medicine

## 2012-01-10 DIAGNOSIS — G894 Chronic pain syndrome: Secondary | ICD-10-CM

## 2012-01-10 NOTE — Telephone Encounter (Signed)
May refill by 01-14-12

## 2012-01-10 NOTE — Telephone Encounter (Signed)
Pt called req refill of oxyCODONE-acetaminophen (PERCOCET) 10-325 MG per tablet  ° °

## 2012-01-10 NOTE — Telephone Encounter (Signed)
Last filled 12/14/11, #120 with 0 refills

## 2012-01-14 MED ORDER — OXYCODONE-ACETAMINOPHEN 10-325 MG PO TABS: 1.0000 | ORAL_TABLET | Freq: Four times a day (QID) | ORAL | Status: DC | PRN

## 2012-01-14 NOTE — Telephone Encounter (Signed)
Rx ready for pick up and patient is aware 

## 2012-02-10 ENCOUNTER — Other Ambulatory Visit: Payer: Self-pay | Admitting: Family Medicine

## 2012-02-11 ENCOUNTER — Other Ambulatory Visit: Payer: Self-pay | Admitting: Family Medicine

## 2012-02-11 DIAGNOSIS — G894 Chronic pain syndrome: Secondary | ICD-10-CM

## 2012-02-11 NOTE — Telephone Encounter (Signed)
Ok to refill by this Wednesday

## 2012-02-11 NOTE — Telephone Encounter (Signed)
Last filled on 8-19, #120 with 0 refills Sig every 6 hours prn

## 2012-02-11 NOTE — Telephone Encounter (Signed)
Pt needs new rx percocet. Pt will be out wednesday

## 2012-02-13 MED ORDER — OXYCODONE-ACETAMINOPHEN 10-325 MG PO TABS: 1.0000 | ORAL_TABLET | Freq: Four times a day (QID) | ORAL | Status: DC | PRN

## 2012-02-13 NOTE — Telephone Encounter (Signed)
Pt informed ready to pick up

## 2012-03-10 ENCOUNTER — Other Ambulatory Visit: Payer: Self-pay | Admitting: Family Medicine

## 2012-03-10 NOTE — Telephone Encounter (Signed)
Pt needs new rx oxycodone. Pt is aware MD out of office this week

## 2012-03-12 ENCOUNTER — Ambulatory Visit (INDEPENDENT_AMBULATORY_CARE_PROVIDER_SITE_OTHER): Payer: PRIVATE HEALTH INSURANCE | Admitting: Family Medicine

## 2012-03-12 ENCOUNTER — Encounter: Payer: Self-pay | Admitting: Family Medicine

## 2012-03-12 VITALS — BP 104/70 | Temp 98.4°F | Wt 162.0 lb

## 2012-03-12 DIAGNOSIS — G894 Chronic pain syndrome: Secondary | ICD-10-CM

## 2012-03-12 DIAGNOSIS — T148XXA Other injury of unspecified body region, initial encounter: Secondary | ICD-10-CM

## 2012-03-12 MED ORDER — OXYCODONE-ACETAMINOPHEN 10-325 MG PO TABS: 1.0000 | ORAL_TABLET | Freq: Four times a day (QID) | ORAL | Status: DC | PRN

## 2012-03-12 NOTE — Patient Instructions (Signed)
-  ice to area a few times daily for fifteen minutes  -follow up with your doctor in 1 month or sooner if worsening or other concerns   Hematoma A hematoma is a pocket of blood that collects under the skin, in an organ, in a body space, in a joint space, or in other tissue. The blood can clot to form a lump that you can see and feel. The lump is often firm, sore, and sometimes even painful and tender. Most hematomas get better in a few days to weeks. However, some hematomas may be serious and require medical care.Hematomas can range in size from very small to very large. CAUSES  A hematoma can be caused by a blunt or penetrating injury. It can also be caused by leakage from a blood vessel under the skin. Spontaneous leakage from a blood vessel is more likely to occur in elderly people, especially those taking blood thinners. Sometimes, a hematoma can develop after certain medical procedures. SYMPTOMS  Unlike a bruise, a hematoma forms a firm lump that you can feel. This lump is the collection of blood. The collection of blood can also cause your skin to turn a blue to dark blue color. If the hematoma is close to the surface of the skin, it often produces a yellowish color in the skin. DIAGNOSIS  Your caregiver can determine whether you have a hematoma based on your history and a physical exam. TREATMENT  Hematomas usually go away on their own over time. Rarely does the blood need to be drained out of the body. HOME CARE INSTRUCTIONS   Put ice on the injured area.  Put ice in a plastic bag.  Place a towel between your skin and the bag.  Leave the ice on for 15 to 20 minutes, 3 to 4 times a day for the first 1 to 2 days.  After the first 2 days, switch to using warm compresses on the hematoma..  Only take over-the-counter or prescription medicines for pain, discomfort, or fever as directed by your caregiver. Most patients can take acetaminophen or ibuprofen for the pain. SEEK IMMEDIATE  MEDICAL CARE IF:   You have increasing pain, or your pain is not controlled with medicine.  You have a fever.  You have worsening swelling or discoloration.  Your skin over the hematoma breaks or starts bleeding. MAKE SURE YOU:   Understand these instructions.  Will watch your condition.  Will get help right away if you are not doing well or get worse. Document Released: 12/27/2003 Document Revised: 08/06/2011 Document Reviewed: 01/15/2011 [facility] Patient Information 2013 [place], [place].
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
## 2012-03-14 ENCOUNTER — Other Ambulatory Visit: Payer: Self-pay | Admitting: Family Medicine
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
## 2012-04-02 ENCOUNTER — Other Ambulatory Visit: Payer: Self-pay | Admitting: Family Medicine

## 2012-04-02 NOTE — Telephone Encounter (Signed)
Refill OK
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
## 2012-04-03 NOTE — Telephone Encounter (Signed)
ambien last filled 08-23-11, #30 with 5 refills

## 2012-04-03 NOTE — Telephone Encounter (Signed)
Already answered  

## 2012-04-11 ENCOUNTER — Ambulatory Visit (INDEPENDENT_AMBULATORY_CARE_PROVIDER_SITE_OTHER): Payer: Self-pay | Admitting: Family Medicine

## 2012-04-11 ENCOUNTER — Encounter: Payer: Self-pay | Admitting: Family Medicine

## 2012-04-11 VITALS — BP 110/60 | Temp 97.8°F | Wt 162.0 lb

## 2012-04-11 DIAGNOSIS — M797 Fibromyalgia: Secondary | ICD-10-CM

## 2012-04-11 DIAGNOSIS — IMO0001 Reserved for inherently not codable concepts without codable children: Secondary | ICD-10-CM

## 2012-04-11 DIAGNOSIS — Z23 Encounter for immunization: Secondary | ICD-10-CM

## 2012-04-11 DIAGNOSIS — G894 Chronic pain syndrome: Secondary | ICD-10-CM

## 2012-04-11 MED ORDER — OXYCODONE-ACETAMINOPHEN 10-325 MG PO TABS: 1.0000 | ORAL_TABLET | Freq: Four times a day (QID) | ORAL | Status: DC | PRN
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
## 2012-05-07 ENCOUNTER — Other Ambulatory Visit: Payer: Self-pay | Admitting: Family Medicine

## 2012-05-07 DIAGNOSIS — G894 Chronic pain syndrome: Secondary | ICD-10-CM

## 2012-05-07 NOTE — Telephone Encounter (Signed)
Pt needs new rx oxycodone 10-325 mg °

## 2012-05-07 NOTE — Telephone Encounter (Signed)
Oxycodone last filled 04-11-12, #120 with 0 refills

## 2012-05-08 NOTE — Telephone Encounter (Signed)
Refill OK

## 2012-05-09 MED ORDER — OXYCODONE-ACETAMINOPHEN 10-325 MG PO TABS: 1.0000 | ORAL_TABLET | Freq: Four times a day (QID) | ORAL | Status: DC | PRN

## 2012-05-09 NOTE — Telephone Encounter (Signed)
Pt informed Rx ready to pick up

## 2012-05-09 NOTE — Addendum Note (Signed)
Addended by: Melchor Amour on: 05/09/2012 09:20 AM   Modules accepted: Orders

## 2012-05-12 ENCOUNTER — Ambulatory Visit: Payer: BC Managed Care – PPO | Admitting: Family Medicine

## 2012-05-14 ENCOUNTER — Ambulatory Visit (INDEPENDENT_AMBULATORY_CARE_PROVIDER_SITE_OTHER): Payer: BC Managed Care – PPO | Admitting: Family Medicine

## 2012-05-14 ENCOUNTER — Encounter: Payer: Self-pay | Admitting: Family Medicine

## 2012-05-14 VITALS — BP 120/80 | Temp 98.2°F | Wt 168.0 lb

## 2012-05-14 DIAGNOSIS — R404 Transient alteration of awareness: Secondary | ICD-10-CM

## 2012-05-14 DIAGNOSIS — R400 Somnolence: Secondary | ICD-10-CM
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
## 2012-05-28 ENCOUNTER — Other Ambulatory Visit: Payer: Self-pay | Admitting: Family Medicine

## 2012-05-29 NOTE — Telephone Encounter (Signed)
Refill for 6 months. 

## 2012-05-29 NOTE — Telephone Encounter (Signed)
lyrica BID last filled 11-16-11, #60 with 5 refills

## 2012-06-04 ENCOUNTER — Other Ambulatory Visit: Payer: Self-pay | Admitting: Family Medicine

## 2012-06-04 DIAGNOSIS — G894 Chronic pain syndrome: Secondary | ICD-10-CM

## 2012-06-04 NOTE — Telephone Encounter (Signed)
Last filled 05/09/12, #120 with 0 refills

## 2012-06-04 NOTE — Telephone Encounter (Signed)
Pt needs refill of oxyCODONE-acetaminophen (PERCOCET) 10-325 MG per tablet. Thank you!  

## 2012-06-05 NOTE — Telephone Encounter (Signed)
Refill OK by 06-07-12

## 2012-06-06 MED ORDER — OXYCODONE-ACETAMINOPHEN 10-325 MG PO TABS: 1.0000 | ORAL_TABLET | Freq: Four times a day (QID) | ORAL | Status: DC | PRN

## 2012-06-06 NOTE — Telephone Encounter (Signed)
Pt informed ready to pick up

## 2012-07-01 ENCOUNTER — Other Ambulatory Visit: Payer: Self-pay | Admitting: Family Medicine

## 2012-07-01 DIAGNOSIS — G894 Chronic pain syndrome: Secondary | ICD-10-CM

## 2012-07-01 NOTE — Telephone Encounter (Signed)
Pt needs refill of oxyCODONE-acetaminophen (PERCOCET) 10-325 MG per tablet Will pick up Fri

## 2012-07-03 NOTE — Telephone Encounter (Signed)
I will print Rx tomorrow (Friday) with your permission.

## 2012-07-03 NOTE — Telephone Encounter (Signed)
Oxycodone, one tab every 6 hours last filled 06-06-12, #120 with 0 refills.

## 2012-07-03 NOTE — Telephone Encounter (Signed)
Ok

## 2012-07-04 MED ORDER — OXYCODONE-ACETAMINOPHEN 10-325 MG PO TABS: 1.0000 | ORAL_TABLET | Freq: Four times a day (QID) | ORAL | Status: DC | PRN

## 2012-07-04 NOTE — Telephone Encounter (Signed)
Pt informed Rx ready to pick up

## 2012-07-04 NOTE — Addendum Note (Signed)
Addended by: Melchor Amour on: 07/04/2012 08:00 AM   Modules accepted: Orders

## 2012-07-30 ENCOUNTER — Ambulatory Visit (INDEPENDENT_AMBULATORY_CARE_PROVIDER_SITE_OTHER): Payer: BC Managed Care – PPO | Admitting: Family Medicine

## 2012-07-30 VITALS — BP 100/68 | HR 106 | Temp 97.4°F | Wt 163.0 lb

## 2012-07-30 DIAGNOSIS — K219 Gastro-esophageal reflux disease without esophagitis: Secondary | ICD-10-CM

## 2012-07-30 DIAGNOSIS — G894 Chronic pain syndrome: Secondary | ICD-10-CM

## 2012-07-30 DIAGNOSIS — R1011 Right upper quadrant pain: Secondary | ICD-10-CM

## 2012-07-30 LAB — CBC WITH DIFFERENTIAL/PLATELET
Basophils Absolute: 0 10*3/uL (ref 0.0–0.1)
Basophils Relative: 0.4 % (ref 0.0–3.0)
Eosinophils Absolute: 0 10*3/uL (ref 0.0–0.7)
Lymphocytes Relative: 23.8 % (ref 12.0–46.0)
MCHC: 33.3 g/dL (ref 30.0–36.0)
Neutrophils Relative %: 68.6 % (ref 43.0–77.0)
Platelets: 215 10*3/uL (ref 150.0–400.0)
RBC: 4.46 Mil/uL (ref 3.87–5.11)
RDW: 12.8 % (ref 11.5–14.6)

## 2012-07-30 LAB — HEPATIC FUNCTION PANEL
Alkaline Phosphatase: 75 U/L (ref 39–117)
Bilirubin, Direct: 0 mg/dL (ref 0.0–0.3)
Total Bilirubin: 0.5 mg/dL (ref 0.3–1.2)

## 2012-07-30 MED ORDER — OXYCODONE-ACETAMINOPHEN 10-325 MG PO TABS: 1.0000 | ORAL_TABLET | Freq: Four times a day (QID) | ORAL | Status: DC | PRN

## 2012-07-30 NOTE — Progress Notes (Signed)
  Subjective:    Patient ID: Toni Collins, female    DOB: [birth year], 47 y.o.   MRN: [id number]  HPI Patient presents with approximate two-week history of intermittent abdominal pain. Location is epigastric with occasional right upper quadrant. Symptoms seem worse after eating but no particular foods. Decreased appetite. Rare or vomiting. Occasional diarrhea. Frequent burning sensation epigastric region to sternum. Has not taken any antacids. Denies any fever or chills.  No alleviating factors. Symptoms worse after eating and lying supine. Strong family history of gallstones and multiple family members. Patient has concerns for this. Pain is moderate.  Past Medical History  Diagnosis Date  . INSOMNIA, CHRONIC 09/02/2008  . GRIEF REACTION 09/30/2009  . DEPRESSION 09/02/2008  . PREDIABETES 03/10/2009  . Colon polyps   . Fibromyalgia    Past Surgical History  Procedure Laterality Date  . Abdominal hysterectomy  2002    TAH  . Tmj arthroplasty      x2  . Diagnostic laparoscopy  1993, 1994, 1998, 2000    x4    reports that she has never smoked. She does not have any smokeless tobacco history on file. She reports that she does not drink alcohol. Her drug history is not on file. family history includes Arthritis in her mother and Diabetes in her maternal grandmother.  There is no history of Depression. No Known Allergies    Review of Systems  Constitutional: Positive for appetite change. Negative for fever and chills.  HENT: Negative for trouble swallowing and voice change.   Respiratory: Negative for cough and shortness of breath.   Cardiovascular: Negative for chest pain.  Gastrointestinal: Positive for nausea, vomiting, abdominal pain and diarrhea. Negative for constipation, blood in stool and abdominal distention.  Neurological: Negative for dizziness.       Objective:   Physical Exam  Constitutional: She appears well-developed and well-nourished.  Cardiovascular:  Normal rate and regular rhythm.   Pulmonary/Chest: Effort normal and breath sounds normal. No respiratory distress. She has no wheezes. She has no rales.  Abdominal: Soft. Bowel sounds are normal. She exhibits no distension and no mass. There is no rebound and no guarding.  Mild tenderness right upper quadrant greater than epigastric. No guarding          Assessment & Plan:  Abdominal pain. Epigastric and right upper quadrant. She has some definite GERD symptoms but rule out symptomatic gallstones (RUQ pain, diarrhea, worse after meals). Diet information on reflux. Check labs with CBC, hepatic, lipase. Abdominal ultrasound. Start Prilosec 20 mg daily

## 2012-07-30 NOTE — Patient Instructions (Addendum)
Start daily Prilosec  We will call you with lab results and ultrasound appointment Follow up promptly for any fever or worsening abdominal pain.   Diet for Gastroesophageal Reflux Disease, Adult Reflux (acid reflux) is when acid from your stomach flows up into the esophagus. When acid comes in contact with the esophagus, the acid causes irritation and soreness (inflammation) in the esophagus. When reflux happens often or so severely that it causes damage to the esophagus, it is called gastroesophageal reflux disease (GERD). Nutrition therapy can help ease the discomfort of GERD. FOODS OR DRINKS TO AVOID OR LIMIT  Smoking or chewing tobacco. Nicotine is one of the most potent stimulants to acid production in the gastrointestinal tract.  Caffeinated and decaffeinated coffee and black tea.  Regular or low-calorie carbonated beverages or energy drinks (caffeine-free carbonated beverages are allowed).   Strong spices, such as black pepper, white pepper, red pepper, cayenne, curry powder, and chili powder.  Peppermint or spearmint.  Chocolate.  High-fat foods, including meats and fried foods. Extra added fats including oils, butter, salad dressings, and nuts. Limit these to less than 8 tsp per day.  Fruits and vegetables if they are not tolerated, such as citrus fruits or tomatoes.  Alcohol.  Any food that seems to aggravate your condition. If you have questions regarding your diet, call your caregiver or a registered dietitian. OTHER THINGS THAT MAY HELP GERD INCLUDE:   Eating your meals slowly, in a relaxed setting.  Eating 5 to 6 small meals per day instead of 3 large meals.  Eliminating food for a period of time if it causes distress.  Not lying down until 3 hours after eating a meal.  Keeping the head of your bed raised 6 to 9 inches (15 to 23 cm) by using a foam wedge or blocks under the legs of the bed. Lying flat may make symptoms worse.  Being physically active. Weight  loss may be helpful in reducing reflux in overweight or obese adults.  Wear loose fitting clothing EXAMPLE MEAL PLAN This meal plan is approximately 2,000 calories based on https://www.[facility].org/ meal planning guidelines. Breakfast   cup cooked oatmeal.  1 cup strawberries.  1 cup low-fat milk.  1 oz almonds. Snack  1 cup cucumber slices.  6 oz yogurt (made from low-fat or fat-free milk). Lunch  2 slice whole-wheat bread.  2 oz sliced [place].  2 tsp mayonnaise.  1 cup blueberries.  1 cup snap peas. Snack  6 whole-wheat crackers.  1 oz string cheese. Dinner   cup brown rice.  1 cup mixed veggies.  1 tsp olive oil.  3 oz grilled fish. Document Released: 05/14/2005 Document Revised: 08/06/2011 Document Reviewed: 03/30/2011 [facility] Patient Information 2013 [place], [place].

## 2012-07-31 NOTE — Progress Notes (Signed)
Quick Note:  Pt informed ______ 

## 2012-08-04 ENCOUNTER — Ambulatory Visit
Admission: RE | Admit: 2012-08-04 | Discharge: 2012-08-04 | Disposition: A | Discharge status: Home / Self Care | Payer: BC Managed Care – PPO | Source: Ambulatory Visit | Attending: Family Medicine | Admitting: Family Medicine

## 2012-08-04 DIAGNOSIS — R1011 Right upper quadrant pain: Secondary | ICD-10-CM

## 2012-08-04 IMAGING — US US ABDOMEN COMPLETE
1 series · 14 of 25 positions shown · non-contrast
Comparison: None.

CLINICAL DATA: Epigastric and right upper quadrant pain with
nausea.

COMPLETE ABDOMINAL ULTRASOUND

[Series 1: us abdomen complete · 0.24mm/px · 14 of 74 slices shown]
[im 1/74]
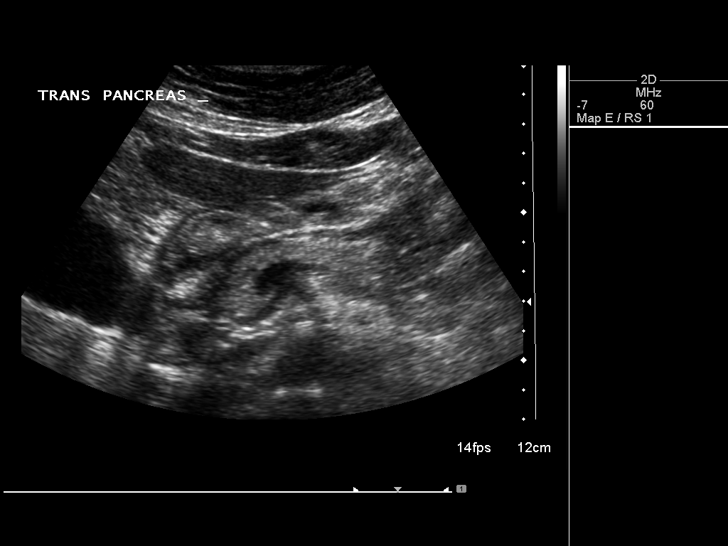
[im 7/74]
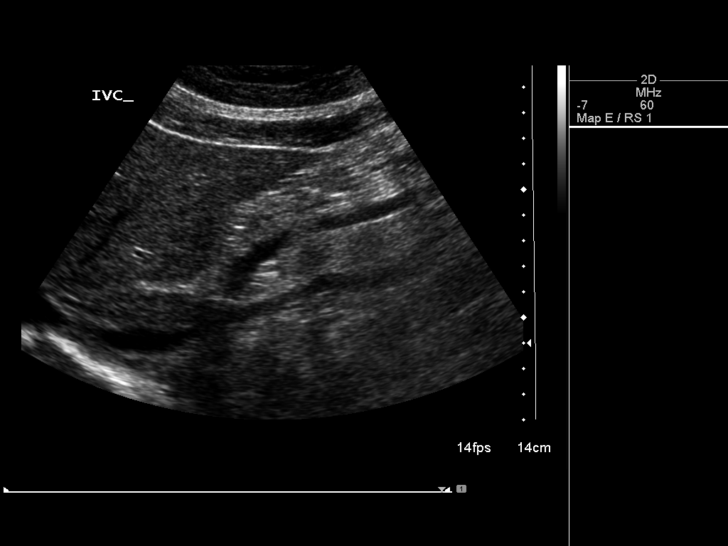
[im 13/74]
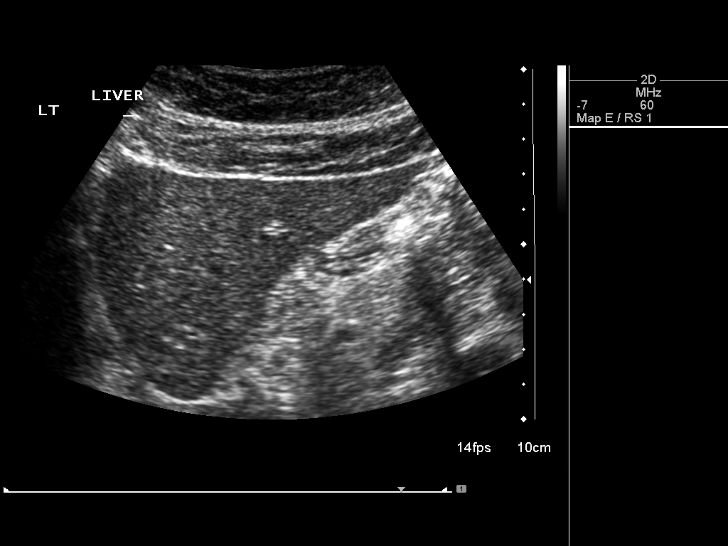
[im 19/74]
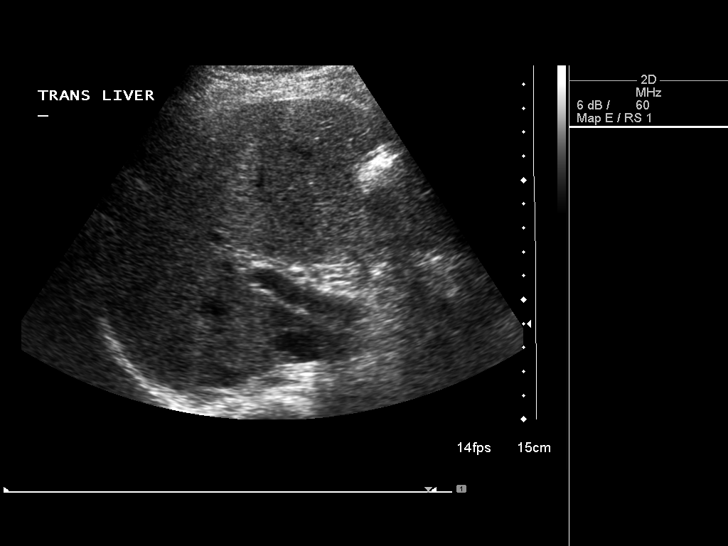
[im 25/74]
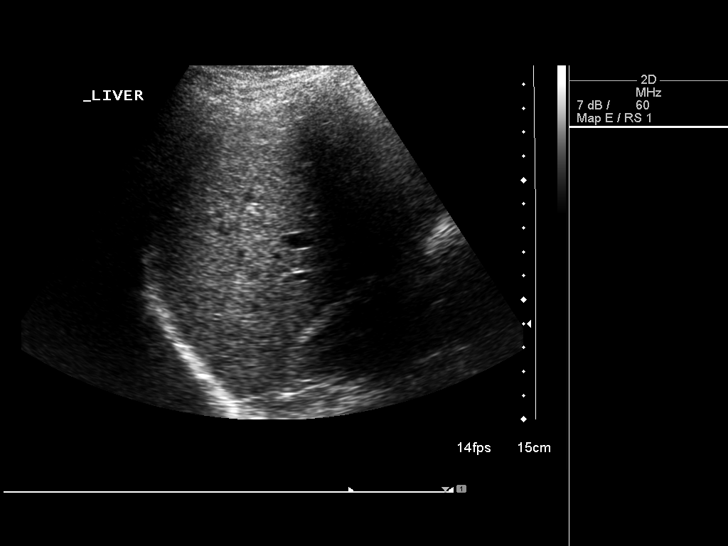
[im 28/74]
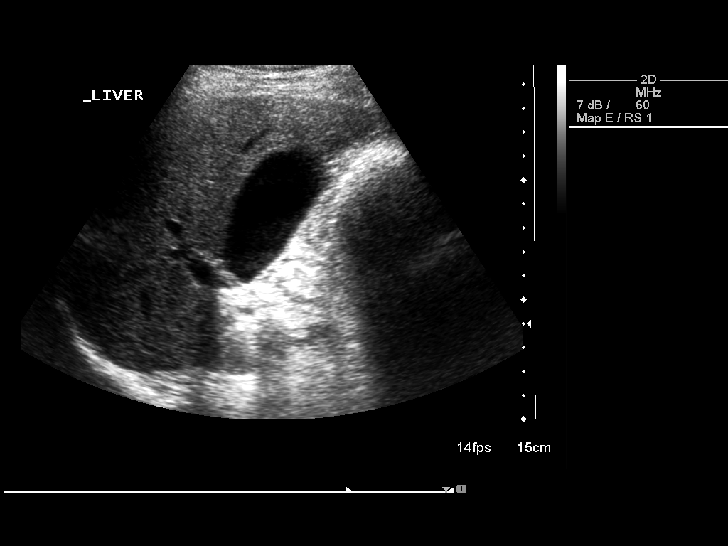
[im 34/74]
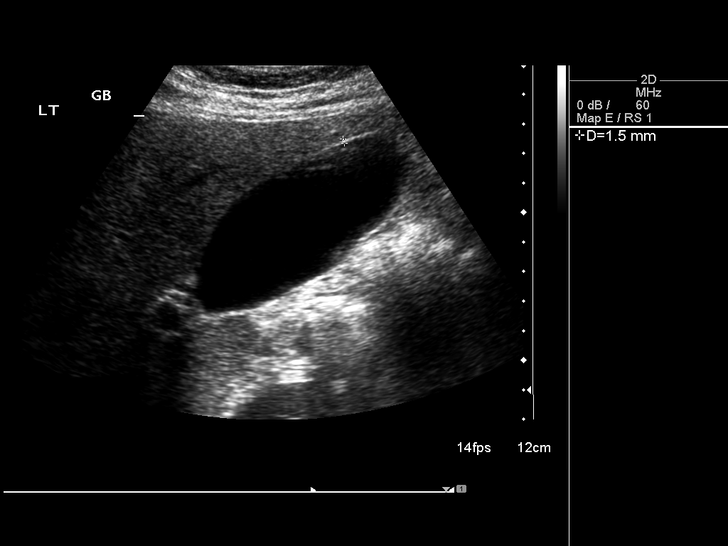
[im 40/74]
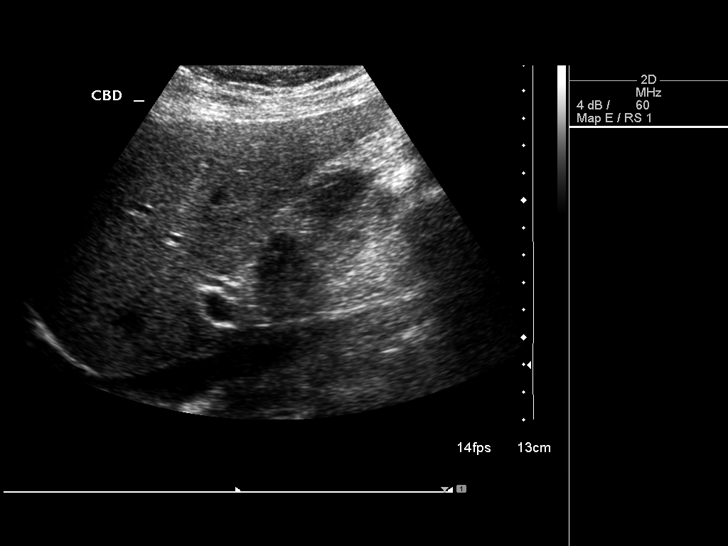
[im 46/74]
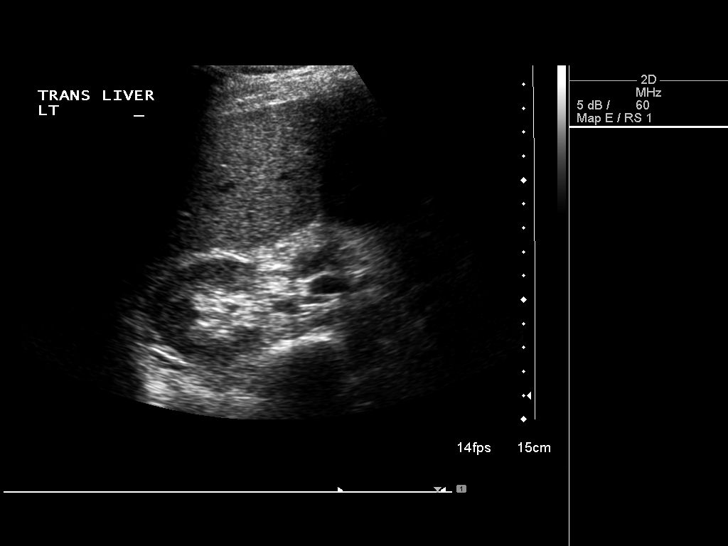
[im 49/74]
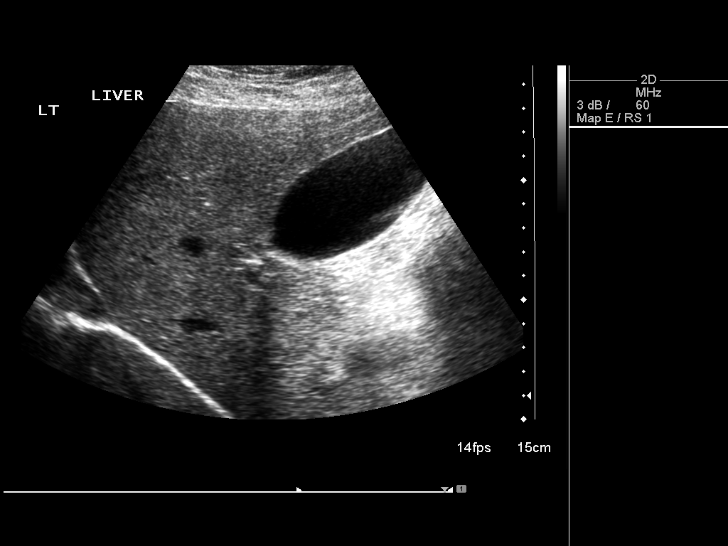
[im 55/74]
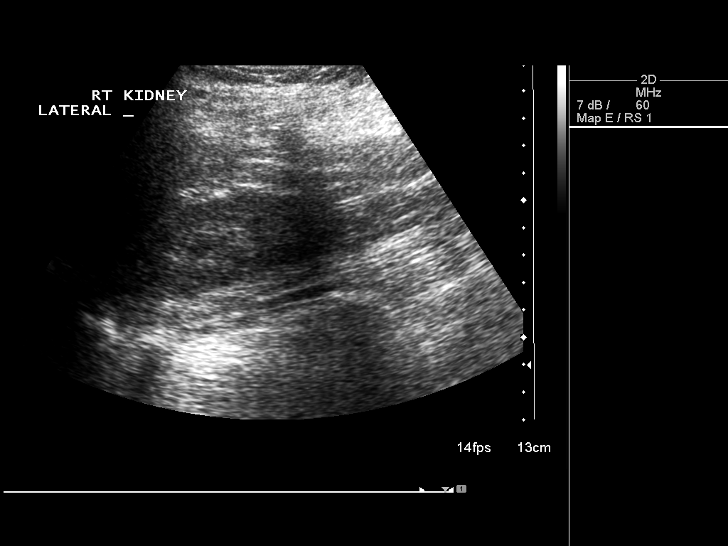
[im 61/74]
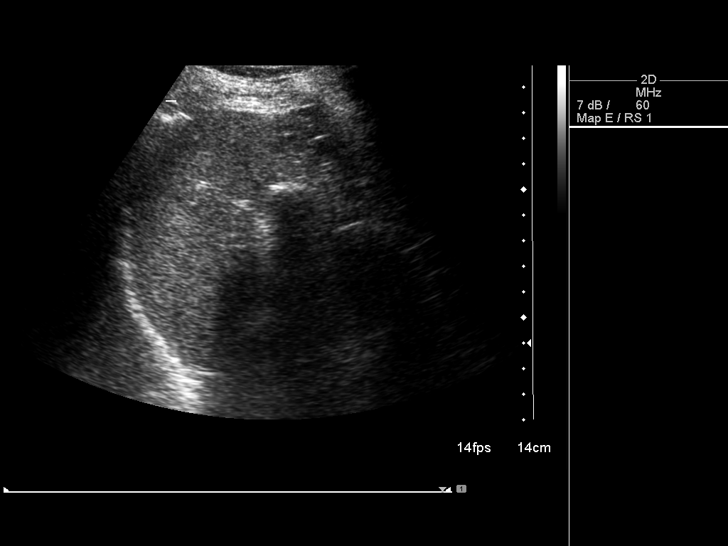
[im 67/74]
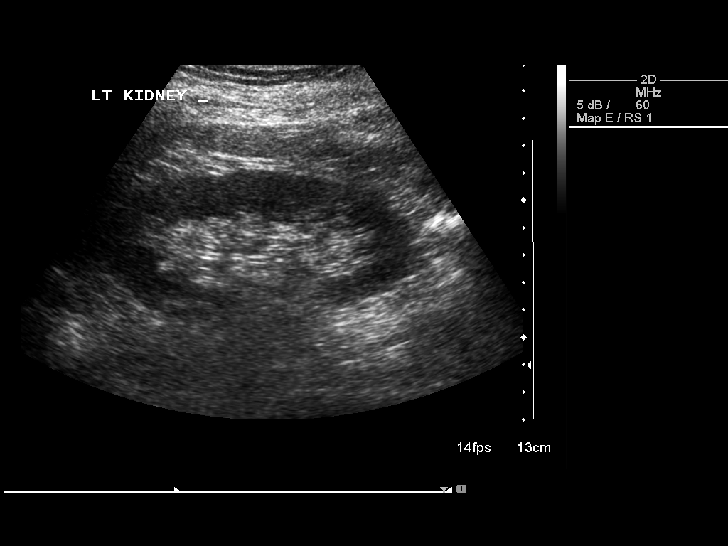
[im 74/74]
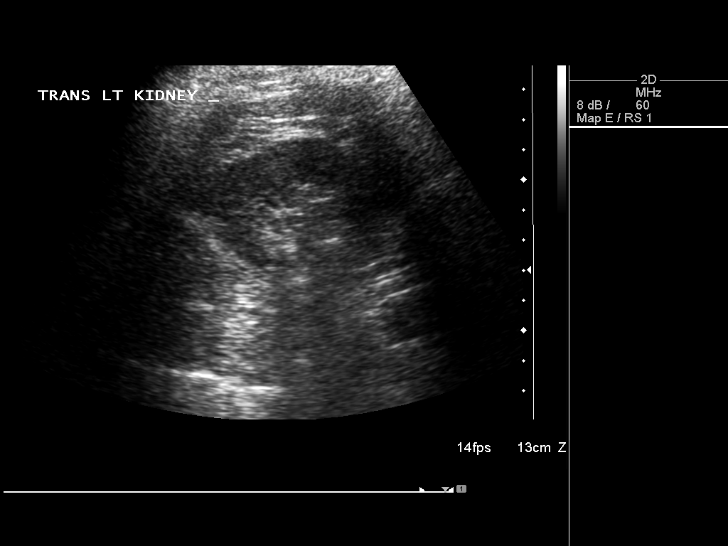

[14 of 25 positions shown; findings below may reference images not displayed]

FINDINGS: Gallbladder:  No gallstones, gallbladder wall thickening, or
pericholecystic fluid.

Common bile duct:  Measures 4 mm, within normal limits.

Liver:  No focal lesion identified.  Within normal limits in
parenchymal echogenicity.

IVC:  Appears normal.

Pancreas:  No focal abnormality seen.

Spleen:  Measures 7.1 cm, negative.

Right Kidney:  Measures 10.7 cm.  Parenchymal echogenicity is
normal.  No hydronephrosis.  No focal lesion.

Left Kidney:  Measures 11.4 cm.  Parenchymal echogenicity is
normal.  No hydronephrosis.  No focal lesions.

Abdominal aorta:  No aneurysm identified.
IMPRESSION: Negative abdominal ultrasound.

## 2012-08-04 NOTE — Progress Notes (Signed)
Quick Note:  Pt informed ______ 

## 2012-08-26 ENCOUNTER — Telehealth: Payer: Self-pay | Admitting: Family Medicine

## 2012-08-26 DIAGNOSIS — G894 Chronic pain syndrome: Secondary | ICD-10-CM

## 2012-08-26 NOTE — Telephone Encounter (Signed)
Percocet last filled 07-30-12

## 2012-08-26 NOTE — Telephone Encounter (Signed)
Pt needs new rx percocet °

## 2012-08-29 MED ORDER — OXYCODONE-ACETAMINOPHEN 10-325 MG PO TABS: 1.0000 | ORAL_TABLET | Freq: Four times a day (QID) | ORAL | Status: DC | PRN

## 2012-08-29 NOTE — Telephone Encounter (Signed)
Pt informed

## 2012-08-29 NOTE — Telephone Encounter (Signed)
Refill OK

## 2012-09-26 ENCOUNTER — Ambulatory Visit (INDEPENDENT_AMBULATORY_CARE_PROVIDER_SITE_OTHER): Payer: BC Managed Care – PPO | Admitting: Family Medicine

## 2012-09-26 VITALS — BP 110/70 | Temp 98.4°F | Wt 162.0 lb

## 2012-09-26 DIAGNOSIS — F3289 Other specified depressive episodes: Secondary | ICD-10-CM

## 2012-09-26 DIAGNOSIS — G894 Chronic pain syndrome: Secondary | ICD-10-CM

## 2012-09-26 DIAGNOSIS — F329 Major depressive disorder, single episode, unspecified: Secondary | ICD-10-CM

## 2012-09-26 DIAGNOSIS — M797 Fibromyalgia: Secondary | ICD-10-CM

## 2012-09-26 DIAGNOSIS — IMO0001 Reserved for inherently not codable concepts without codable children: Secondary | ICD-10-CM

## 2012-09-26 MED ORDER — PREGABALIN 150 MG PO CAPS: 150.0000 mg | ORAL_CAPSULE | Freq: Two times a day (BID) | ORAL | Status: DC

## 2012-09-26 MED ORDER — OXYCODONE-ACETAMINOPHEN 10-325 MG PO TABS: 1.0000 | ORAL_TABLET | Freq: Four times a day (QID) | ORAL | Status: DC | PRN

## 2012-09-26 NOTE — Progress Notes (Signed)
  Subjective:    Patient ID: Toni Collins, female    DOB: [birth year], 47 y.o.   MRN: [id number]  HPI Patient here for followup multiple medical problems. She has history of chronic fibromyalgia, recurrent depression, and history of prediabetes. She has some chronic insomnia and takes intermittent Ambien.  Her father passed away also exactly 2 years ago and just recently had some increased depressive symptoms. No suicidal ideation. She remains on sertraline 200 mg daily and Abilify 10 mg daily. Has previously benefited from counseling and would like to consider going back for some counseling. Compliant with therapy.  She has chronic pain syndrome. She's been on many drugs previously and either had and tolerance or did not have good success. She currently takes Lyrica 75 mg twice daily which has helped slightly. She still some chronic upper back and neck pains. She's been on chronic opioids for years prior to seeing [place] in this practice. She has not had any constipation or other side effects. We have explained previously multiple times opioids are generally not use for fibromyalgia pain but she's had some chronic neck and back pains that are not clearly related to fibromyalgia. She has been unable taper off in the past.  Past Medical History  Diagnosis Date  . INSOMNIA, CHRONIC 09/02/2008  . GRIEF REACTION 09/30/2009  . DEPRESSION 09/02/2008  . PREDIABETES 03/10/2009  . Colon polyps   . Fibromyalgia    Past Surgical History  Procedure Laterality Date  . Abdominal hysterectomy  2002    TAH  . Tmj arthroplasty      x2  . Diagnostic laparoscopy  1993, 1994, 1998, 2000    x4    reports that she has never smoked. She does not have any smokeless tobacco history on file. She reports that she does not drink alcohol. Her drug history is not on file. family history includes Arthritis in her mother and Diabetes in her maternal grandmother.  There is no history of Depression. No Known  Allergies    Review of Systems  Constitutional: Positive for fatigue.  Respiratory: Negative for shortness of breath.   Cardiovascular: Negative for chest pain.  Musculoskeletal: Positive for myalgias, back pain and arthralgias. Negative for joint swelling.  Psychiatric/Behavioral: Positive for dysphoric mood. Negative for suicidal ideas.       Objective:   Physical Exam  Constitutional: She appears well-developed and well-nourished.  Neck: Neck supple. No thyromegaly present.  Cardiovascular: Normal rate and regular rhythm.   Pulmonary/Chest: Effort normal and breath sounds normal. No respiratory distress. She has no wheezes. She has no rales.  Musculoskeletal: She exhibits no edema.  Psychiatric: Her behavior is normal. Thought content normal.          Assessment & Plan:  #1 history of recurrent depression. We've recommended further counseling and have given her names. #2 chronic pain syndrome. She has at least some component of fibromyalgia. Titrate Lyrica 150 mg twice daily.

## 2012-10-17 ENCOUNTER — Telehealth: Payer: Self-pay | Admitting: Family Medicine

## 2012-10-17 NOTE — Telephone Encounter (Signed)
Patient Information:  Caller Name: [name]  Phone: [phone]  Patient: Toni Collins, Toni Collins  Gender: Female  DOB: May 12, 1966  Age: 47 Years  PCP: [name] (Family Practice)  Pregnant: No  Office Follow Up:  Does the office need to follow up with this patient?: No  Instructions For The Office: N/A   Symptoms  Reason For Call & Symptoms: Pt is calling and states that she has poison ivy; sx started 10/16/12; rash is located on both arms and a little on the back of the neck; rash is red and raised; itchy; small amount of clear drainage if scratches the rash  Reviewed Health History In EMR: Yes  Reviewed Medications In EMR: Yes  Reviewed Allergies In EMR: Yes  Reviewed Surgeries / Procedures: Yes  Date of Onset of Symptoms: 10/16/2012  Treatments Tried: Calamine lotion  Treatments Tried Worked: Yes OB / GYN:  LMP: Unknown  Guideline(s) Used:  Poison Ivy - [facility] or [facility]  Disposition Per Guideline:   Home Care  Reason For Disposition Reached:   Poison [place], [place], or [place] with no complications  Advice Given:  Hydrocortisone Cream for Itching:   Apply 1% hydrocortisone cream 4 times a day to reduce itching. Use it for 5 days.  Keep the cream in the refrigerator (Reason: it feels better if applied cold).  Apply Cold to the Area:  Soak the involved area in cool water for 20 minutes or massage it with an ice cube as often as necessary to reduce itching and oozing.  Oral Antihistamine Medication for Itching:   Antihistamines may cause sleepiness. Do not drink, drive, or operate dangerous machinery while taking antihistamines.  Avoid Scratching:   Cut your fingernails short and try not to scratch so as to prevent a secondary infection from bacteria.  New Blisters Appear:  If new blisters occur several days after the first ones, you probably have had ongoing contact with the irritating plant oil. To prevent recurrences: bathe all dogs and wash all clothes and shoes that were  with you on the day of exposure.  New Blisters Appear:  If new blisters occur several days after the first ones, you probably have had ongoing contact with the irritating plant oil. To prevent recurrences: bathe all dogs and wash all clothes and shoes that were with you on the day of exposure.  Call Back If:  Rash lasts longer than 3 weeks  It looks infected  You become worse.  Patient Will Follow Care Advice:  YES

## 2012-10-21 ENCOUNTER — Other Ambulatory Visit: Payer: Self-pay | Admitting: Family Medicine

## 2012-10-21 NOTE — Telephone Encounter (Signed)
Refill times three. 

## 2012-10-21 NOTE — Telephone Encounter (Signed)
Ambien last filled on 04-02-12 #30 with 5 refills

## 2012-10-22 ENCOUNTER — Telehealth: Payer: Self-pay | Admitting: Family Medicine

## 2012-10-22 DIAGNOSIS — G894 Chronic pain syndrome: Secondary | ICD-10-CM

## 2012-10-22 NOTE — Telephone Encounter (Signed)
Pt needs new rx percocet by Friday afternoon

## 2012-10-22 NOTE — Telephone Encounter (Signed)
Percocet last filled 09/26/12, #120 with 0 refills.  May has 31 days, June 2nd is next Monday

## 2012-10-23 NOTE — Telephone Encounter (Signed)
She has actually been consistently getting slightly early-2/7, 3/5, 4/4, 5/2.  So, she should have a few extra.  Refill Monday.

## 2012-10-23 NOTE — Telephone Encounter (Signed)
Pt informed and she does have some extra, so Monday will be fine.

## 2012-10-27 MED ORDER — OXYCODONE-ACETAMINOPHEN 10-325 MG PO TABS: 1.0000 | ORAL_TABLET | Freq: Four times a day (QID) | ORAL | Status: DC | PRN

## 2012-10-27 NOTE — Telephone Encounter (Signed)
Spoke to pt told her Rx for Percocet is ready for pick up will be at front desk. Pt verbalized understanding. Rx printed and signed by Dr. [name].

## 2012-10-27 NOTE — Addendum Note (Signed)
Addended by: Jimmye Norman on: 10/27/2012 08:27 AM   Modules accepted: Orders

## 2012-11-17 ENCOUNTER — Other Ambulatory Visit: Payer: Self-pay | Admitting: Family Medicine

## 2012-11-17 NOTE — Telephone Encounter (Signed)
Rx request to pharmacy/SL[facility]  

## 2012-11-21 ENCOUNTER — Telehealth: Payer: Self-pay | Admitting: Family Medicine

## 2012-11-21 NOTE — Telephone Encounter (Signed)
PT called to request a refill of her oxyCODONE-acetaminophen (PERCOCET) 10-325 MG per tablet. Please assist.  °

## 2012-11-23 NOTE — Telephone Encounter (Signed)
May refill by 11-26-12.

## 2012-11-24 NOTE — Telephone Encounter (Signed)
Called and left message that it is to early to refill medication and that we can refill on 11-26-2012

## 2012-11-26 ENCOUNTER — Other Ambulatory Visit: Payer: Self-pay

## 2012-11-26 DIAGNOSIS — G894 Chronic pain syndrome: Secondary | ICD-10-CM

## 2012-11-26 MED ORDER — OXYCODONE-ACETAMINOPHEN 10-325 MG PO TABS: 1.0000 | ORAL_TABLET | Freq: Four times a day (QID) | ORAL | Status: DC | PRN

## 2012-12-16 ENCOUNTER — Other Ambulatory Visit: Payer: Self-pay | Admitting: Family Medicine

## 2012-12-24 ENCOUNTER — Telehealth: Payer: Self-pay | Admitting: Family Medicine

## 2012-12-24 DIAGNOSIS — G894 Chronic pain syndrome: Secondary | ICD-10-CM

## 2012-12-24 NOTE — Telephone Encounter (Signed)
Last refill on 11/26/12 #120 no refills Last office visit 09/26/12

## 2012-12-24 NOTE — Telephone Encounter (Signed)
PT requesting a refill of her oxyCODONE-acetaminophen (PERCOCET) 10-325 MG per tablet. Please assist.

## 2012-12-26 ENCOUNTER — Ambulatory Visit: Payer: BC Managed Care – PPO | Admitting: Family Medicine

## 2012-12-28 NOTE — Telephone Encounter (Signed)
Refill OK

## 2012-12-29 MED ORDER — OXYCODONE-ACETAMINOPHEN 10-325 MG PO TABS: 1.0000 | ORAL_TABLET | Freq: Four times a day (QID) | ORAL | Status: DC | PRN

## 2012-12-29 NOTE — Telephone Encounter (Signed)
Pt aware that rx will be at the front for pick up

## 2013-01-28 ENCOUNTER — Encounter: Payer: Self-pay | Admitting: Family Medicine

## 2013-01-28 ENCOUNTER — Ambulatory Visit (INDEPENDENT_AMBULATORY_CARE_PROVIDER_SITE_OTHER): Payer: BC Managed Care – PPO | Admitting: Family Medicine

## 2013-01-28 VITALS — BP 110/70 | HR 74 | Temp 98.5°F | Wt 170.0 lb

## 2013-01-28 DIAGNOSIS — G894 Chronic pain syndrome: Secondary | ICD-10-CM

## 2013-01-28 DIAGNOSIS — M79604 Pain in right leg: Secondary | ICD-10-CM

## 2013-01-28 DIAGNOSIS — R635 Abnormal weight gain: Secondary | ICD-10-CM

## 2013-01-28 DIAGNOSIS — M79609 Pain in unspecified limb: Secondary | ICD-10-CM

## 2013-01-28 MED ORDER — OXYCODONE-ACETAMINOPHEN 10-325 MG PO TABS: 1.0000 | ORAL_TABLET | Freq: Four times a day (QID) | ORAL | Status: DC | PRN

## 2013-01-28 NOTE — Progress Notes (Signed)
  Subjective:    Patient ID: Toni Collins, female    DOB: [birth year], 47 y.o.   MRN: [id number]  HPI Patient seen for medical follow up Her major complaint today is increased weight gain. She's had about 8 pounds weight gain since last visit. Currently walks about 1 mile per day for exercise. Denies any dietary changes. She had thyroid functions screen about 2 years ago normal. Denies any major peripheral edema issues. She has not done any calorie counting but feels she's not eating many calories per day  Long history of recurrent depression and remains on high-dose sertraline along with Abilify. Depression is currently stable.  She has a long history of chronic pain syndrome involving chronic back pain and fibromyalgia. She has been from any years on oxycodone which generally works well. No constipation issues.   Review of Systems  Constitutional: Positive for fatigue and unexpected weight change. Negative for fever and appetite change.  HENT: Negative for congestion.   Respiratory: Negative for cough, shortness of breath and wheezing.   Cardiovascular: Negative for chest pain, palpitations and leg swelling.  Gastrointestinal: Negative for nausea, vomiting, diarrhea and constipation.  Endocrine: Negative for polydipsia and polyuria.  Genitourinary: Negative for dysuria.  Musculoskeletal: Positive for back pain.  Neurological: Negative for dizziness, syncope, weakness and headaches.  Hematological: Negative for adenopathy.  Psychiatric/Behavioral: Negative for confusion and agitation.       Objective:   Physical Exam  Constitutional: She appears well-developed and well-nourished.  Neck: Neck supple. No thyromegaly present.  Cardiovascular: Normal rate and regular rhythm.   Pulmonary/Chest: Effort normal and breath sounds normal. No respiratory distress. She has no wheezes. She has no rales.  Musculoskeletal: She exhibits no edema.  Normal distal foot pulses  Skin:  She  has some varicosities lower extremities but no pitting edema. No evidence for acute phlebitis. No erythema or warmth.          Assessment & Plan:  #1 weight gain. Schedule future labs with basic metabolic panel, lipid panel, TSH. We have suggested application to help monitor her calorie intake. We discussed increasing her exercise levels. #2 chronic pain syndrome. Stable. Refill oxycodone #3 history of recurrent depression. Currently stable. She's had extensive counseling in the past.

## 2013-02-02 ENCOUNTER — Other Ambulatory Visit: Payer: BC Managed Care – PPO

## 2013-02-06 ENCOUNTER — Other Ambulatory Visit: Payer: BC Managed Care – PPO

## 2013-02-06 LAB — BASIC METABOLIC PANEL
Calcium: 8.9 mg/dL (ref 8.4–10.5)
Creatinine, Ser: 0.8 mg/dL (ref 0.4–1.2)
Sodium: 139 mEq/L (ref 135–145)

## 2013-02-06 LAB — LDL CHOLESTEROL, DIRECT: Direct LDL: 206 mg/dL

## 2013-02-06 LAB — LIPID PANEL
Cholesterol: 267 mg/dL — ABNORMAL HIGH (ref 0–200)
Triglycerides: 169 mg/dL — ABNORMAL HIGH (ref 0.0–149.0)

## 2013-02-06 LAB — SEDIMENTATION RATE: Sed Rate: 24 mm/hr — ABNORMAL HIGH (ref 0–22)

## 2013-02-06 LAB — TSH: TSH: 3.04 u[IU]/mL (ref 0.35–5.50)

## 2013-02-09 ENCOUNTER — Other Ambulatory Visit: Payer: Self-pay

## 2013-02-09 MED ORDER — ATORVASTATIN CALCIUM 20 MG PO TABS: 20.0000 mg | ORAL_TABLET | Freq: Every day | ORAL | Status: DC

## 2013-02-17 ENCOUNTER — Telehealth: Payer: Self-pay | Admitting: Family Medicine
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
## 2013-02-18 ENCOUNTER — Ambulatory Visit: Payer: Self-pay | Admitting: Family Medicine

## 2013-02-18 NOTE — Telephone Encounter (Signed)
Pt called and stated that she doesn't thing this appointment is necessary because Dr. [name] is aware of her condition. She would like to know if he can simply call something in. Please assist.

## 2013-02-18 NOTE — Telephone Encounter (Signed)
FYI: Patient is coming in to be seen today @ 1:45pm

## 2013-02-24 ENCOUNTER — Telehealth: Payer: Self-pay | Admitting: Family Medicine

## 2013-02-24 DIAGNOSIS — G894 Chronic pain syndrome: Secondary | ICD-10-CM

## 2013-02-24 NOTE — Telephone Encounter (Signed)
Pt request refill of oxyCODONE-acetaminophen (PERCOCET) 10-325 MG per tablet  

## 2013-02-24 NOTE — Telephone Encounter (Signed)
Not due until Friday

## 2013-02-24 NOTE — Telephone Encounter (Signed)
Last refill 01/28/13 #120 0 refill Last visit 01/28/13

## 2013-02-27 MED ORDER — OXYCODONE-ACETAMINOPHEN 10-325 MG PO TABS: 1.0000 | ORAL_TABLET | Freq: Four times a day (QID) | ORAL | Status: DC | PRN

## 2013-02-27 NOTE — Telephone Encounter (Signed)
Pt is aware to pick up RX today at the front

## 2013-03-09 ENCOUNTER — Other Ambulatory Visit: Payer: Self-pay | Admitting: Family Medicine

## 2013-03-09 NOTE — Telephone Encounter (Signed)
Last refill 09/26/12 #60 5 refill

## 2013-03-09 NOTE — Telephone Encounter (Signed)
Refill for 6 months. 

## 2013-03-20 ENCOUNTER — Other Ambulatory Visit: Payer: Self-pay | Admitting: Family Medicine

## 2013-03-24 ENCOUNTER — Telehealth: Payer: Self-pay | Admitting: Family Medicine

## 2013-03-24 DIAGNOSIS — G894 Chronic pain syndrome: Secondary | ICD-10-CM

## 2013-03-24 NOTE — Telephone Encounter (Signed)
Left message for patient to return call.

## 2013-03-24 NOTE — Telephone Encounter (Signed)
Pt is aware that she is early. Pt stated that she will run out on Sunday. Informed patient that Dr. [name] is out of the office, pt stated that she could wait till 03-30-13 to get a refill  Last visit 01/28/13 Last refill 02/27/13

## 2013-03-24 NOTE — Telephone Encounter (Signed)
Pt needs new rx oxycodone. Pt will be out this wk

## 2013-03-29 NOTE — Telephone Encounter (Signed)
Refill OK

## 2013-03-30 MED ORDER — OXYCODONE-ACETAMINOPHEN 10-325 MG PO TABS: 1.0000 | ORAL_TABLET | Freq: Four times a day (QID) | ORAL | Status: DC | PRN

## 2013-03-30 NOTE — Telephone Encounter (Signed)
Pt is aware that RX is at the front for pick up

## 2013-04-10 ENCOUNTER — Encounter: Payer: Self-pay | Admitting: *Deleted

## 2013-04-13 ENCOUNTER — Other Ambulatory Visit (INDEPENDENT_AMBULATORY_CARE_PROVIDER_SITE_OTHER): Payer: BC Managed Care – PPO

## 2013-04-13 DIAGNOSIS — E785 Hyperlipidemia, unspecified: Secondary | ICD-10-CM

## 2013-04-14 LAB — HEPATIC FUNCTION PANEL
ALT: 28 U/L (ref 0–35)
AST: 25 U/L (ref 0–37)
Albumin: 4.1 g/dL (ref 3.5–5.2)

## 2013-04-14 LAB — LIPID PANEL
Cholesterol: 159 mg/dL (ref 0–200)
HDL: 57 mg/dL
LDL Cholesterol: 78 mg/dL (ref 0–99)
Total CHOL/HDL Ratio: 3
Triglycerides: 120 mg/dL (ref 0.0–149.0)
VLDL: 24 mg/dL (ref 0.0–40.0)

## 2013-04-28 ENCOUNTER — Encounter: Payer: Self-pay | Admitting: Family Medicine

## 2013-04-28 ENCOUNTER — Ambulatory Visit (INDEPENDENT_AMBULATORY_CARE_PROVIDER_SITE_OTHER): Payer: BC Managed Care – PPO | Admitting: Family Medicine

## 2013-04-28 VITALS — BP 112/72 | HR 70 | Temp 97.6°F | Wt 172.0 lb

## 2013-04-28 DIAGNOSIS — F419 Anxiety disorder, unspecified: Secondary | ICD-10-CM | POA: Insufficient documentation

## 2013-04-28 DIAGNOSIS — G894 Chronic pain syndrome: Secondary | ICD-10-CM

## 2013-04-28 DIAGNOSIS — F329 Major depressive disorder, single episode, unspecified: Secondary | ICD-10-CM

## 2013-04-28 DIAGNOSIS — F411 Generalized anxiety disorder: Secondary | ICD-10-CM

## 2013-04-28 DIAGNOSIS — IMO0001 Reserved for inherently not codable concepts without codable children: Secondary | ICD-10-CM

## 2013-04-28 DIAGNOSIS — Z23 Encounter for immunization: Secondary | ICD-10-CM

## 2013-04-28 DIAGNOSIS — E785 Hyperlipidemia, unspecified: Secondary | ICD-10-CM

## 2013-04-28 DIAGNOSIS — F3289 Other specified depressive episodes: Secondary | ICD-10-CM

## 2013-04-28 DIAGNOSIS — M797 Fibromyalgia: Secondary | ICD-10-CM

## 2013-04-28 MED ORDER — BUSPIRONE HCL 10 MG PO TABS: 10.0000 mg | ORAL_TABLET | Freq: Two times a day (BID) | ORAL | Status: DC

## 2013-04-28 MED ORDER — OXYCODONE-ACETAMINOPHEN 10-325 MG PO TABS: 1.0000 | ORAL_TABLET | Freq: Four times a day (QID) | ORAL | Status: DC | PRN
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
## 2013-04-28 NOTE — Progress Notes (Signed)
Pre visit review using our clinic review tool, if applicable. No additional management support is needed unless otherwise documented below in the visit note. 

## 2013-04-30 ENCOUNTER — Telehealth: Payer: Self-pay | Admitting: Family Medicine

## 2013-04-30 NOTE — Telephone Encounter (Signed)
Patient Information:  Caller Name: Karely  Phone: [phone]  Patient: Toni, Collins  Gender: Female  DOB: 1966/01/26  Age: 47 Years  PCP: [name] [facility])  Pregnant: No  Office Follow Up:  Does the office need to follow up with this patient?: No  Instructions For The Office: N/A  RN Note:  Care advised and call back parameters reviewed. Understanding expressed.  Symptoms  Reason For Call & Symptoms: Patient states she has a hemorrhoid for two weeks. Described as small. Occasinal blood noted on tissue.    She forgot to mention at her appt this week.  +itching and burning.  Reviewed Health History In EMR: Yes  Reviewed Medications In EMR: Yes  Reviewed Allergies In EMR: Yes  Reviewed Surgeries / Procedures: Yes  Date of Onset of Symptoms: 04/16/2013 OB / GYN:  LMP: Unknown  Guideline(s) Used:  Rectal Symptoms  Disposition Per Guideline:   Home Care  Reason For Disposition Reached:   Mild rectal pain  Advice Given:  Treatment of Acute Rectal Pain due to Fecal Impaction:   SITZ Bath - Take a 20-minute bath in warm water. Add 2 oz (57 grams) of baking soda to the bath water. This is also called a "Sitz bath" and it often helps relax the anal sphincter and release the bowel movement (BM).  Treatment of Mild Rectal Pain:  Rectal pain and irritation can often be caused by either hemorrhoids or a tiny tear in the rectal opening (anal fissure). Small drops of blood can sometimes be seen on the toilet paper or stool in people with hemorrhoids or rectal irritation from hard bowel movements.  Warm SITZ Bath Twice a Day - Sit in a warm saline bath for 20 minutes bid to cleanse the area and to promote healing. Add 2 ounces (57 grams) of table salt or baking soda to each tub of water. Afterwards, gently pat area dry with unscented toilet paper.  Treatment of Mild Rectal Itching:  The main treatment for rectal itching is keeping the rectal area clean and dry, and  avoiding excessive rubbing or scratching. Loose cotton underwear helps keep the area dry.  Treatment of Mild Rectal Pain:  Rectal pain and irritation can often be caused by either hemorrhoids or a tiny tear in the rectal opening (anal fissure). Small drops of blood can sometimes be seen on the toilet paper or stool in people with hemorrhoids or rectal irritation from hard bowel movements.  Warm SITZ Bath Twice a Day - Sit in a warm saline bath for 20 minutes bid to cleanse the area and to promote healing. Add 2 ounces (57 grams) of table salt or baking soda to each tub of water. Afterwards, gently pat area dry with unscented toilet paper.  Topical Hydrocortisone Twice a Day - After taking a Sitz bath and drying your rectal area, apply 1% hydrocortisone ointment (OTC) bid to reduce irritation. Hydrocortisone is found in various OTC hemorrhoid medications (e.g., Anusol HC, Preparation H Hydrocortisone, Analpram HC Cream).  Treatment of Mild Rectal Itching:  The main treatment for rectal itching is keeping the rectal area clean and dry, and avoiding excessive rubbing or scratching. Loose cotton underwear helps keep the area dry.  Cleansing after a Bowel Movement - Cleanse the anus with warm water after each bowel movement. Use wet cotton or tissue. Pat the area dry using unscented toilet paper. Avoid rubbing area with toilet paper.  Topical Hydrocortisone Twice a Day - Apply 1% hydrocortisone ointment (OTC) bid  to reduce irritation (e.g., Anusol HC, Preparation H Hydrocortisone).  Prevention - Avoid scented toilet products. Keep rectal area clean and dry.  Call Back If:  Severe rectal pain or itching  Acute rectal pain due to fecal impaction is not completely relieved after treatment  Rectal pain or itching lasts over 3 days  Rectal bleeding (i.e., more than just a few drops on toilet paper from wiping)  You become worse.  Patient Will Follow Care Advice:  YES

## 2013-04-30 NOTE — Telephone Encounter (Signed)
Noted  

## 2013-05-01 ENCOUNTER — Encounter: Payer: Self-pay | Admitting: Family Medicine

## 2013-05-04 ENCOUNTER — Telehealth: Payer: Self-pay | Admitting: Family Medicine

## 2013-05-04 MED ORDER — PREGABALIN 150 MG PO CAPS: 150.0000 mg | ORAL_CAPSULE | Freq: Three times a day (TID) | ORAL | Status: DC

## 2013-05-04 NOTE — Telephone Encounter (Signed)
Change Lyrica dose to 150 mg po tid and refill for #90 with 3 refills.

## 2013-05-04 NOTE — Telephone Encounter (Signed)
Pt has increase her lyrica and decrease her percocet and now needs new rx lyrica 150 mg three times a day#90 with refills call into [facility]

## 2013-05-04 NOTE — Telephone Encounter (Signed)
Last refill on Lyrica 03-09-13 #60 5 refill Last visit 04/28/13

## 2013-05-04 NOTE — Telephone Encounter (Signed)
Called in RX 

## 2013-05-29 ENCOUNTER — Ambulatory Visit: Payer: BC Managed Care – PPO | Admitting: Family Medicine

## 2013-06-02 ENCOUNTER — Encounter: Payer: Self-pay | Admitting: Family Medicine

## 2013-06-02 ENCOUNTER — Ambulatory Visit (INDEPENDENT_AMBULATORY_CARE_PROVIDER_SITE_OTHER): Payer: BC Managed Care – PPO | Admitting: Family Medicine

## 2013-06-02 VITALS — BP 132/78 | HR 113 | Temp 98.6°F | Wt 161.0 lb

## 2013-06-02 DIAGNOSIS — G894 Chronic pain syndrome: Secondary | ICD-10-CM

## 2013-06-02 DIAGNOSIS — F3289 Other specified depressive episodes: Secondary | ICD-10-CM

## 2013-06-02 DIAGNOSIS — F329 Major depressive disorder, single episode, unspecified: Secondary | ICD-10-CM

## 2013-06-02 MED ORDER — OXYCODONE-ACETAMINOPHEN 10-325 MG PO TABS: 1.0000 | ORAL_TABLET | Freq: Four times a day (QID) | ORAL | Status: DC | PRN

## 2013-06-02 MED ORDER — SERTRALINE HCL 100 MG PO TABS: 100.0000 mg | ORAL_TABLET | Freq: Every day | ORAL | Status: DC

## 2013-06-02 NOTE — Progress Notes (Signed)
   Subjective:    Patient ID: Toni Collins, female    DOB: 1966/05/17, 48 y.o.   MRN: [id number]  HPI Patient here for followup regarding depression. She's had recurrent depression and basically took herself off Abilify and sertraline. She been on high-dose sertraline 200 mg once daily. She has pending appointment was psychiatrist in 2 weeks. Since stopping her medication she's had progressive sleep difficulties, depressed mood, crying spells and decreased appetite and 11 pounds weight loss. She denies any suicidal ideation. She also had increased her Lyrica to 150 mg 3 times a day in hopes of titrating down her pain medicine that she had increased problems with sedation and  fatigue and has scaled back to twice daily. She remains on Percocet which is taken for several years for chronic pain syndrome and fibromyalgia  Past Medical History  Diagnosis Date  . INSOMNIA, CHRONIC 09/02/2008  . GRIEF REACTION 09/30/2009  . DEPRESSION 09/02/2008  . PREDIABETES 03/10/2009  . Colon polyps   . Fibromyalgia    Past Surgical History  Procedure Laterality Date  . Abdominal hysterectomy  2002    TAH  . Tmj arthroplasty      x2  . Diagnostic laparoscopy  1993, 1994, 1998, 2000    x4    reports that she has never smoked. She does not have any smokeless tobacco history on file. She reports that she does not drink alcohol. Her drug history is not on file. family history includes Arthritis in her mother; Diabetes in her maternal grandmother. There is no history of Depression. No Known Allergies    Review of Systems  Constitutional: Negative for appetite change and unexpected weight change.  Cardiovascular: Negative for chest pain.  Hematological: Positive for adenopathy.  Psychiatric/Behavioral: Positive for sleep disturbance and dysphoric mood. Negative for suicidal ideas, confusion and agitation.       Objective:   Physical Exam  Constitutional: She appears well-developed and well-nourished.    Cardiovascular: Normal rate.   Pulmonary/Chest: Effort normal and breath sounds normal. No respiratory distress. She has no wheezes. She has no rales.  Neurological: She is alert.  Psychiatric: Her behavior is normal. Judgment and thought content normal.  Depressed mood with intermittent crying          Assessment & Plan:  Recurrent depression. Given the fact she's had a least 3 separate episodes of major depression we've explained that she really needs chronic therapy. She has pending appointment with psychiatrist. In the meantime, start back sertraline 50 mg daily for one week then titrate to 100 mg daily.

## 2013-06-02 NOTE — Progress Notes (Signed)
Pre visit review using our clinic review tool, if applicable. No additional management support is needed unless otherwise documented below in the visit note. 

## 2013-06-02 NOTE — Patient Instructions (Signed)
Take Sertraline one half tablet daily for one week then one tablet daily.

## 2013-06-29 ENCOUNTER — Telehealth: Payer: Self-pay | Admitting: Family Medicine

## 2013-06-29 DIAGNOSIS — G894 Chronic pain syndrome: Secondary | ICD-10-CM

## 2013-06-29 NOTE — Telephone Encounter (Signed)
Pt needs new rx oxycodone °

## 2013-06-29 NOTE — Telephone Encounter (Signed)
May refill by the 5th

## 2013-06-29 NOTE — Telephone Encounter (Signed)
Last refill 06/02/13 Last visit 06/02/13 #120 0 refill

## 2013-06-30 ENCOUNTER — Telehealth: Payer: Self-pay | Admitting: Family Medicine
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
## 2013-07-01 NOTE — Telephone Encounter (Signed)
Pt going to bring forms by the office tomorrow.

## 2013-07-01 NOTE — Telephone Encounter (Signed)
Left message for patient to return call.

## 2013-07-02 MED ORDER — OXYCODONE-ACETAMINOPHEN 10-325 MG PO TABS: 1.0000 | ORAL_TABLET | Freq: Four times a day (QID) | ORAL | Status: DC | PRN

## 2013-07-02 NOTE — Telephone Encounter (Signed)
Pt aware that RX is ready for pick up  

## 2013-07-03 ENCOUNTER — Other Ambulatory Visit: Payer: Self-pay | Admitting: Family Medicine

## 2013-07-03 NOTE — Telephone Encounter (Signed)
Last visit 06/02/13 Last refill 03/20/13 #30 2 refills

## 2013-07-05 NOTE — Telephone Encounter (Signed)
Refill for 6 months. 

## 2013-07-27 ENCOUNTER — Telehealth: Payer: Self-pay | Admitting: Family Medicine

## 2013-07-27 DIAGNOSIS — G894 Chronic pain syndrome: Secondary | ICD-10-CM

## 2013-07-27 NOTE — Telephone Encounter (Signed)
Refill by Thursday

## 2013-07-27 NOTE — Telephone Encounter (Signed)
Pt need re-fill of oxyCODONE-acetaminophen (PERCOCET) 10-325 MG per tablet, states she needs it by Thursday.

## 2013-07-27 NOTE — Telephone Encounter (Signed)
Last visit 06/02/13 Last refill 07/02/13 #120 0 refill

## 2013-07-30 MED ORDER — OXYCODONE-ACETAMINOPHEN 10-325 MG PO TABS: 1.0000 | ORAL_TABLET | Freq: Four times a day (QID) | ORAL | Status: DC | PRN

## 2013-07-30 NOTE — Telephone Encounter (Signed)
Pt informed that RX is ready for pick up 

## 2013-08-27 ENCOUNTER — Encounter: Payer: Self-pay | Admitting: Family Medicine

## 2013-08-27 ENCOUNTER — Ambulatory Visit (INDEPENDENT_AMBULATORY_CARE_PROVIDER_SITE_OTHER): Payer: BC Managed Care – PPO | Admitting: Family Medicine

## 2013-08-27 VITALS — BP 126/80 | HR 104 | Wt 169.0 lb

## 2013-08-27 DIAGNOSIS — F329 Major depressive disorder, single episode, unspecified: Secondary | ICD-10-CM

## 2013-08-27 DIAGNOSIS — F3289 Other specified depressive episodes: Secondary | ICD-10-CM

## 2013-08-27 DIAGNOSIS — G894 Chronic pain syndrome: Secondary | ICD-10-CM

## 2013-08-27 MED ORDER — OXYCODONE-ACETAMINOPHEN 10-325 MG PO TABS
1.0000 | ORAL_TABLET | Freq: Four times a day (QID) | ORAL | Status: DC | PRN
Start: 2013-08-27 — End: 2013-09-30

## 2013-08-27 NOTE — Progress Notes (Signed)
Pre visit review using our clinic review tool, if applicable. No additional management support is needed unless otherwise documented below in the visit note. 

## 2013-08-27 NOTE — Progress Notes (Signed)
   Subjective:    Patient ID: Toni Collins, female    DOB: 1965/10/06, 48 y.o.   MRN: [id number]  HPI Patient has long history of recurrent depression and post traumatic stress disorder. We recently refer to psychiatry. She was placed on Paxil currently taking 30 mg daily and he increased her Lyrica to 250 mg 3 times a day. She's doing well at this time. She has had a long history of fibromyalgia and is requesting refills on her pain medication. She's had some improvement with Lyrica in the past. She's been on multiple other medications such as amitriptyline and Cymbalta without improvement.  Past Medical History  Diagnosis Date  . INSOMNIA, CHRONIC 09/02/2008  . GRIEF REACTION 09/30/2009  . DEPRESSION 09/02/2008  . PREDIABETES 03/10/2009  . Colon polyps   . Fibromyalgia    Past Surgical History  Procedure Laterality Date  . Abdominal hysterectomy  2002    TAH  . Tmj arthroplasty      x2  . Diagnostic laparoscopy  1993, 1994, 1998, 2000    x4    reports that she has never smoked. She does not have any smokeless tobacco history on file. She reports that she does not drink alcohol. Her drug history is not on file. family history includes Arthritis in her mother; Diabetes in her maternal grandmother. There is no history of Depression. No Known Allergies    Review of Systems  Constitutional: Negative for appetite change and unexpected weight change.  Psychiatric/Behavioral: Negative for sleep disturbance, decreased concentration and agitation. The patient is not nervous/anxious.        Objective:   Physical Exam  Constitutional: She appears well-developed and well-nourished.  Cardiovascular: Normal rate and regular rhythm.   Pulmonary/Chest: Effort normal and breath sounds normal. No respiratory distress. She has no wheezes. She has no rales.          Assessment & Plan:  #1 History recurrent depression. Recent medical changes as above. She is stable doing well. She is  encouraged to continue followup with psychologist and psychiatrist  #2 history of dyslipidemia. Continue Lipitor. Recent lipids specifically improved  #3 fibromyalgia. No change in medications other than recent increase in Lyrica. Routine followup 6 months

## 2013-09-30 ENCOUNTER — Ambulatory Visit (INDEPENDENT_AMBULATORY_CARE_PROVIDER_SITE_OTHER): Payer: BC Managed Care – PPO | Admitting: Family Medicine

## 2013-09-30 ENCOUNTER — Encounter: Payer: Self-pay | Admitting: Family Medicine

## 2013-09-30 VITALS — BP 110/70 | HR 102 | Wt 162.0 lb

## 2013-09-30 DIAGNOSIS — G894 Chronic pain syndrome: Secondary | ICD-10-CM

## 2013-09-30 DIAGNOSIS — F329 Major depressive disorder, single episode, unspecified: Secondary | ICD-10-CM

## 2013-09-30 DIAGNOSIS — F3289 Other specified depressive episodes: Secondary | ICD-10-CM

## 2013-09-30 MED ORDER — OXYCODONE HCL 10 MG PO TABS: 10.0000 mg | ORAL_TABLET | Freq: Four times a day (QID) | ORAL | Status: DC

## 2013-09-30 NOTE — Progress Notes (Signed)
Pre visit review using our clinic review tool, if applicable. No additional management support is needed unless otherwise documented below in the visit note. 

## 2013-09-30 NOTE — Progress Notes (Signed)
   Subjective:    Patient ID: Toni Collins, female    DOB: 05/09/1966, 48 y.o.   MRN: [id number]  HPI Patient seen today basically to bring me up to speed with recent visit to a psychiatrist. She was recently placed on Paxil in doses just titrated to 40 mg yesterday. They also added Adderall 10 mg twice daily which she and her husband think are helping with her depression. She has not had any side effects. She's lost 7 pounds since last visit but is also trying to lose weight. She is complaining about cost with visiting psychiatrist. She's also seeing a psychologist there but is considering going back to previous psychologist she has had good rapport with him the past.  She has history of some chronic back pain and chronic fibromyalgia. She remains on Lyrica 150 mg 3 times a day. Pain is stable.  Past Medical History  Diagnosis Date  . INSOMNIA, CHRONIC 09/02/2008  . GRIEF REACTION 09/30/2009  . DEPRESSION 09/02/2008  . PREDIABETES 03/10/2009  . Colon polyps   . Fibromyalgia    Past Surgical History  Procedure Laterality Date  . Abdominal hysterectomy  2002    TAH  . Tmj arthroplasty      x2  . Diagnostic laparoscopy  1993, 1994, 1998, 2000    x4    reports that she has never smoked. She does not have any smokeless tobacco history on file. She reports that she does not drink alcohol. Her drug history is not on file. family history includes Arthritis in her mother; Diabetes in her maternal grandmother. There is no history of Depression. No Known Allergies   Review of Systems  Constitutional: Negative for fever and chills.  Cardiovascular: Negative for chest pain.  Psychiatric/Behavioral: Positive for dysphoric mood. Negative for suicidal ideas.       Objective:   Physical Exam  Constitutional: She appears well-developed and well-nourished.  Cardiovascular: Normal rate and regular rhythm.   Pulmonary/Chest: Effort normal and breath sounds normal. No respiratory distress. She  has no wheezes. She has no rales.  Psychiatric:  Somewhat depressed mood appropriate affect          Assessment & Plan:  #1 history of recurrent depression. She's had extensive counseling in the past and is encouraged to continue with this and regular exercise. She is encouraged to continue followup with psychiatrist until he gets her drug regimen stable. She's had recent improvements with Adderall #2 chronic pain syndrome/fibromyalgia. Refill chronic medication for one month

## 2013-10-29 ENCOUNTER — Telehealth: Payer: Self-pay | Admitting: Family Medicine

## 2013-10-29 NOTE — Telephone Encounter (Signed)
Last visit 09/30/13 Last refill 09/30/13 #120 0 refill

## 2013-10-29 NOTE — Telephone Encounter (Signed)
Pt needs new rx oxycodone °

## 2013-10-30 MED ORDER — OXYCODONE HCL 10 MG PO TABS: 10.0000 mg | ORAL_TABLET | Freq: Four times a day (QID) | ORAL | Status: DC

## 2013-10-30 NOTE — Telephone Encounter (Signed)
Pt aware that RX is ready for pick up  

## 2013-10-30 NOTE — Telephone Encounter (Signed)
Refill OK

## 2013-11-25 ENCOUNTER — Telehealth: Payer: Self-pay | Admitting: Family Medicine

## 2013-11-25 NOTE — Telephone Encounter (Signed)
Pt requesting rx Oxycodone HCl 10 MG TABS

## 2013-11-25 NOTE — Telephone Encounter (Signed)
Last visit 09/30/13 Last refill 10/30/13 #120 0 refill

## 2013-11-25 NOTE — Telephone Encounter (Signed)
Refill by tomorrow.

## 2013-11-26 MED ORDER — OXYCODONE HCL 10 MG PO TABS: 10.0000 mg | ORAL_TABLET | Freq: Four times a day (QID) | ORAL | Status: DC

## 2013-11-26 NOTE — Telephone Encounter (Signed)
Pt aware that RX is ready for pick up  

## 2013-12-28 ENCOUNTER — Telehealth: Payer: Self-pay | Admitting: Family Medicine

## 2013-12-28 MED ORDER — OXYCODONE HCL 10 MG PO TABS: 10.0000 mg | ORAL_TABLET | Freq: Four times a day (QID) | ORAL | Status: DC

## 2013-12-28 NOTE — Telephone Encounter (Signed)
Pt needs re-fill Oxycodone HCl 10 MG TABS

## 2013-12-28 NOTE — Telephone Encounter (Signed)
Pt informed that Dr. B is out of the office this week. Pt only given #30. Pt aware that RX is ready for  Pick up

## 2014-01-04 ENCOUNTER — Telehealth: Payer: Self-pay | Admitting: Family Medicine

## 2014-01-04 NOTE — Telephone Encounter (Signed)
Pt called to ask for the rest of her rx . She was only given 30 pills since Dr Elease Hashimoto was out

## 2014-01-04 NOTE — Telephone Encounter (Signed)
Last visit 09/30/13 Last refill 12/28/13 #30 0 refill Pt usually gets #120

## 2014-01-04 NOTE — Telephone Encounter (Signed)
Refill OK

## 2014-01-05 MED ORDER — OXYCODONE HCL 10 MG PO TABS: 10.0000 mg | ORAL_TABLET | Freq: Four times a day (QID) | ORAL | Status: DC

## 2014-01-05 NOTE — Telephone Encounter (Signed)
Rx is ready for pick up left message on pt VM

## 2014-02-02 ENCOUNTER — Telehealth: Payer: Self-pay | Admitting: Family Medicine

## 2014-02-02 NOTE — Telephone Encounter (Signed)
Pt request refill of the following: Oxycodone HCl 10 MG TABS    Phamacy:  Pick up

## 2014-02-02 NOTE — Telephone Encounter (Signed)
Last visit 09/30/13 Last refill 01/05/14 #120 0 refill

## 2014-02-03 NOTE — Telephone Encounter (Signed)
May refill by 02-04-14

## 2014-02-04 MED ORDER — OXYCODONE HCL 10 MG PO TABS: 10.0000 mg | ORAL_TABLET | Freq: Four times a day (QID) | ORAL | Status: DC

## 2014-02-04 NOTE — Telephone Encounter (Signed)
Pt is aware that Rx is ready for pickup  

## 2014-02-15 ENCOUNTER — Other Ambulatory Visit: Payer: Self-pay | Admitting: Family Medicine

## 2014-02-15 ENCOUNTER — Ambulatory Visit: Payer: BC Managed Care – PPO | Admitting: Family Medicine

## 2014-02-16 NOTE — Telephone Encounter (Signed)
Refill for 6 months. 

## 2014-02-16 NOTE — Telephone Encounter (Signed)
Last visit 09/30/13 Last refill 07/03/13 #30 5 refill

## 2014-02-17 ENCOUNTER — Ambulatory Visit (INDEPENDENT_AMBULATORY_CARE_PROVIDER_SITE_OTHER): Payer: BC Managed Care – PPO | Admitting: Family Medicine

## 2014-02-17 ENCOUNTER — Encounter: Payer: Self-pay | Admitting: Family Medicine

## 2014-02-17 VITALS — BP 110/74 | HR 83 | Wt 156.0 lb

## 2014-02-17 DIAGNOSIS — N3000 Acute cystitis without hematuria: Secondary | ICD-10-CM

## 2014-02-17 DIAGNOSIS — N200 Calculus of kidney: Secondary | ICD-10-CM | POA: Insufficient documentation

## 2014-02-17 DIAGNOSIS — M545 Low back pain, unspecified: Secondary | ICD-10-CM

## 2014-02-17 DIAGNOSIS — Z23 Encounter for immunization: Secondary | ICD-10-CM

## 2014-02-17 MED ORDER — METHOCARBAMOL 500 MG PO TABS: 500.0000 mg | ORAL_TABLET | Freq: Four times a day (QID) | ORAL | Status: DC | PRN

## 2014-02-17 NOTE — Progress Notes (Signed)
Pre visit review using our clinic review tool, if applicable. No additional management support is needed unless otherwise documented below in the visit note. 

## 2014-02-17 NOTE — Progress Notes (Signed)
   Subjective:    Patient ID: Toni Collins, female    DOB: 1965/11/13, 48 y.o.   MRN: [id number]  HPI ER followup. Patient last week developed some left lower back pain left flank pain. By Sunday evening she had acute worsening and went to emergency department over in [place]. Urinalysis revealed pyuria and hematuria. She's not had any gross hematuria. Urine culture grew out Escherichia coli. She was treated with Keflex. She CT abdomen and pelvis which showed renal calculi but no hydronephrosis. Nonobstructing renal calculi. No prior history of known kidney stones. Appendix and other organs appeared normal. White count was normal. She still some left lumbar back pain. No radiculopathy symptoms.  Denies recent injury. No numbness or weakness. No loss of bladder or bowel control. Takes oxycodone at baseline is been on this for many years.   Pain is overall improved compared to Sunday.  Past Medical History  Diagnosis Date  . INSOMNIA, CHRONIC 09/02/2008  . GRIEF REACTION 09/30/2009  . DEPRESSION 09/02/2008  . PREDIABETES 03/10/2009  . Colon polyps   . Fibromyalgia    Past Surgical History  Procedure Laterality Date  . Abdominal hysterectomy  2002    TAH  . Tmj arthroplasty      x2  . Diagnostic laparoscopy  1993, 1994, 1998, 2000    x4    reports that she has never smoked. She does not have any smokeless tobacco history on file. She reports that she does not drink alcohol. Her drug history is not on file. family history includes Arthritis in her mother; Diabetes in her maternal grandmother. There is no history of Depression. No Known Allergies    Review of Systems  Constitutional: Negative for fever and chills.  Respiratory: Negative for shortness of breath.   Cardiovascular: Negative for chest pain.  Gastrointestinal: Negative for abdominal pain.  Genitourinary: Negative for dysuria and hematuria.  Musculoskeletal: Positive for back pain.  Neurological: Negative for  dizziness.       Objective:   Physical Exam  Constitutional: She appears well-developed and well-nourished. No distress.  HENT:  Mouth/Throat: Oropharynx is clear and moist.  Cardiovascular: Normal rate and regular rhythm.   Pulmonary/Chest: Effort normal and breath sounds normal. No respiratory distress. She has no wheezes. She has no rales.  Abdominal: Soft. She exhibits no distension and no mass. There is no tenderness. There is no rebound and no guarding.  Musculoskeletal: She exhibits no edema.  Straight leg raises are negative bilaterally.  Neurological:  Full-strength lower extremities with symmetric reflexes ankle and knee          Assessment & Plan:  #1 recent Escherichia coli UTI.  Symptomatically stable.  Patient remains on Keflex. Finish out Keflex #2 recent nonobstructive renal calculi which may have been incidental finding on CT scan. She did have one episode of severe pain Sunday night and may have reflected a stone that was passed  #3 left lumbar back pain. Suspect musculoskeletal, as worse with movement suggesting musculoskeletal origin. Doubt related to recent UTI. Add Robaxin 500 mg each bedtime. Stretches given. Consider physical therapy if no better in 2 weeks.

## 2014-02-17 NOTE — Patient Instructions (Signed)

## 2014-02-26 ENCOUNTER — Other Ambulatory Visit: Payer: Self-pay

## 2014-02-26 ENCOUNTER — Ambulatory Visit (INDEPENDENT_AMBULATORY_CARE_PROVIDER_SITE_OTHER): Payer: BC Managed Care – PPO | Admitting: Family Medicine

## 2014-02-26 ENCOUNTER — Encounter: Payer: Self-pay | Admitting: Family Medicine

## 2014-02-26 VITALS — BP 126/84 | HR 80 | Temp 98.3°F | Wt 158.0 lb

## 2014-02-26 DIAGNOSIS — M797 Fibromyalgia: Secondary | ICD-10-CM

## 2014-02-26 DIAGNOSIS — R7309 Other abnormal glucose: Secondary | ICD-10-CM

## 2014-02-26 DIAGNOSIS — E785 Hyperlipidemia, unspecified: Secondary | ICD-10-CM

## 2014-02-26 DIAGNOSIS — R7303 Prediabetes: Secondary | ICD-10-CM

## 2014-02-26 DIAGNOSIS — Z1239 Encounter for other screening for malignant neoplasm of breast: Secondary | ICD-10-CM

## 2014-02-26 DIAGNOSIS — G894 Chronic pain syndrome: Secondary | ICD-10-CM

## 2014-02-26 LAB — HEPATIC FUNCTION PANEL
ALBUMIN: 4 g/dL (ref 3.5–5.2)
ALT: 28 U/L (ref 0–35)
AST: 24 U/L (ref 0–37)
Alkaline Phosphatase: 91 U/L (ref 39–117)
Bilirubin, Direct: 0 mg/dL (ref 0.0–0.3)
TOTAL PROTEIN: 7.6 g/dL (ref 6.0–8.3)
Total Bilirubin: 0.4 mg/dL (ref 0.2–1.2)

## 2014-02-26 LAB — LIPID PANEL
CHOL/HDL RATIO: 3
Cholesterol: 158 mg/dL (ref 0–200)
HDL: 60 mg/dL (ref >39.00)
LDL CALC: 85 mg/dL (ref 0–99)
NonHDL: 98
TRIGLYCERIDES: 67 mg/dL (ref 0.0–149.0)
VLDL: 13.4 mg/dL (ref 0.0–40.0)

## 2014-02-26 NOTE — Patient Instructions (Signed)
Consider setting up repeat mammogram this year.  

## 2014-02-26 NOTE — Progress Notes (Signed)
   Subjective:    Patient ID: Toni Collins, female    DOB: Oct 16, 1965, 48 y.o.   MRN: [id number]  HPI  Patient seen for routine medical followup. Her chronic problems include history of fibromyalgia, chronic pain syndrome, hyperlipidemia, depression, chronic anxiety, kidney stones. Recent back pain has stabilized. She had recent UTI treated with Keflex and symptomatically stable. She takes Lipitor for hyperlipidemia. No side effects. Due for followup labs. No family history of premature CAD.  She's had several year history of severe fibromyalgia pains. She basically came to me on oxycodone and has tried several times tapering back without success. She takes Lyrica and we tried many other things including low-dose Tricyclics., muscle relaxers, and Cymbalta without relief.  She has had prediabetes but sugars have normalized with her recent weight loss.  Past Medical History  Diagnosis Date  . INSOMNIA, CHRONIC 09/02/2008  . GRIEF REACTION 09/30/2009  . DEPRESSION 09/02/2008  . PREDIABETES 03/10/2009  . Colon polyps   . Fibromyalgia    Past Surgical History  Procedure Laterality Date  . Abdominal hysterectomy  2002    TAH  . Tmj arthroplasty      x2  . Diagnostic laparoscopy  1993, 1994, 1998, 2000    x4    reports that she has never smoked. She does not have any smokeless tobacco history on file. She reports that she does not drink alcohol. Her drug history is not on file. family history includes Arthritis in her mother; Diabetes in her maternal grandmother. There is no history of Depression. No Known Allergies    Review of Systems  Constitutional: Negative for fatigue.  Eyes: Negative for visual disturbance.  Respiratory: Negative for cough, chest tightness, shortness of breath and wheezing.   Cardiovascular: Negative for chest pain, palpitations and leg swelling.  Endocrine: Negative for polydipsia and polyuria.  Genitourinary: Negative for dysuria.  Musculoskeletal:  Positive for back pain and myalgias.  Neurological: Negative for dizziness, seizures, syncope, weakness, light-headedness and headaches.       Objective:   Physical Exam  Constitutional: She appears well-developed and well-nourished.  Neck: Neck supple. No thyromegaly present.  Cardiovascular: Normal rate and regular rhythm.  Exam reveals no gallop.   Pulmonary/Chest: Effort normal and breath sounds normal. No respiratory distress. She has no wheezes. She has no rales.  Musculoskeletal: She exhibits no edema.  Neurological: She has normal reflexes.          Assessment & Plan:  #1 hyperlipidemia. Continue Lipitor. Check lipid and hepatic panel #2 fibromyalgia. She has chronic pain which is currently stable. We've emphasized importance of regular exercise #3 hx of prediabetes.  Normal glucose recently with weight loss.  Have recommended further weight loss

## 2014-02-26 NOTE — Progress Notes (Signed)
Pre visit review using our clinic review tool, if applicable. No additional management support is needed unless otherwise documented below in the visit note. 

## 2014-03-01 ENCOUNTER — Telehealth: Payer: Self-pay

## 2014-03-01 NOTE — Telephone Encounter (Signed)
Last visit 02/26/14 Last refill 02/04/14 #120 0 refill

## 2014-03-01 NOTE — Telephone Encounter (Signed)
May refill by Thursday.

## 2014-03-01 NOTE — Telephone Encounter (Signed)
Called pt about lab results and pt states she know that her Oxycodone is not due until Friday but she needs to pick it up on Thursday.

## 2014-03-03 ENCOUNTER — Ambulatory Visit: Payer: BC Managed Care – PPO

## 2014-03-04 MED ORDER — OXYCODONE HCL 10 MG PO TABS: 10.0000 mg | ORAL_TABLET | Freq: Four times a day (QID) | ORAL | Status: DC

## 2014-03-04 NOTE — Addendum Note (Signed)
Addended by: Marcina Millard on: 03/04/2014 08:25 AM   Modules accepted: Orders

## 2014-03-04 NOTE — Telephone Encounter (Signed)
Left message on Vm that Rx is ready for pickup  

## 2014-03-09 ENCOUNTER — Other Ambulatory Visit: Payer: Self-pay

## 2014-03-09 ENCOUNTER — Ambulatory Visit
Admission: RE | Admit: 2014-03-09 | Discharge: 2014-03-09 | Disposition: A | Discharge status: Home / Self Care | Payer: BC Managed Care – PPO | Source: Ambulatory Visit

## 2014-03-09 DIAGNOSIS — Z1231 Encounter for screening mammogram for malignant neoplasm of breast: Secondary | ICD-10-CM

## 2014-03-31 ENCOUNTER — Telehealth: Payer: Self-pay | Admitting: Family Medicine

## 2014-03-31 NOTE — Telephone Encounter (Signed)
OK to refill by Friday

## 2014-03-31 NOTE — Telephone Encounter (Signed)
Would like to pick up Oxycodone 10mg  Rx on Friday please. Call when ready for pick up, ok to LM.

## 2014-03-31 NOTE — Telephone Encounter (Signed)
Last visit 02/26/14 Last refill 03/04/14 #120 0 refill

## 2014-04-02 MED ORDER — OXYCODONE HCL 10 MG PO TABS: 10.0000 mg | ORAL_TABLET | Freq: Four times a day (QID) | ORAL | Status: DC

## 2014-04-02 NOTE — Addendum Note (Signed)
Addended by: Marcina Millard on: 04/02/2014 08:28 AM   Modules accepted: Orders

## 2014-04-02 NOTE — Telephone Encounter (Signed)
Left message on VM that Rx is ready for pick up 

## 2014-04-28 ENCOUNTER — Telehealth: Payer: Self-pay | Admitting: Family Medicine

## 2014-04-28 NOTE — Telephone Encounter (Signed)
Pt needs new rx oxycodone. Pt is aware md out of office

## 2014-04-30 MED ORDER — OXYCODONE HCL 10 MG PO TABS
10.0000 mg | ORAL_TABLET | Freq: Four times a day (QID) | ORAL | Status: DC
Start: 2014-04-30 — End: 2014-06-01

## 2014-04-30 NOTE — Telephone Encounter (Signed)
Ok to refill x1jo month in PCP absence.

## 2014-04-30 NOTE — Telephone Encounter (Signed)
Rx done and the pt is aware this was left at the front desk for her to pick up. 

## 2014-05-31 ENCOUNTER — Telehealth: Payer: Self-pay | Admitting: Family Medicine

## 2014-05-31 NOTE — Telephone Encounter (Signed)
Last visit 02/26/14  Last refil 04/30/14 #120 0 refill

## 2014-05-31 NOTE — Telephone Encounter (Signed)
Refill OK

## 2014-05-31 NOTE — Telephone Encounter (Signed)
Patient is requesting a re-fill on Oxycodone HCl 10 MG TABS.  She forgot to call due to the loss of her family member. She is completely out.

## 2014-06-01 MED ORDER — OXYCODONE HCL 10 MG PO TABS: 10.0000 mg | ORAL_TABLET | Freq: Four times a day (QID) | ORAL | Status: DC

## 2014-06-01 NOTE — Telephone Encounter (Signed)
Pt is aware that RX is ready for pick up. 

## 2014-06-28 ENCOUNTER — Telehealth: Payer: Self-pay | Admitting: Family Medicine

## 2014-06-28 NOTE — Telephone Encounter (Signed)
Pt needs new rx oxycodone. Pt will need by thurs

## 2014-06-28 NOTE — Telephone Encounter (Signed)
Last visit 02/26/14 Last refill 06/01/14 #120 0 refill

## 2014-06-28 NOTE — Telephone Encounter (Signed)
May refill by the 4th.

## 2014-07-01 MED ORDER — OXYCODONE HCL 10 MG PO TABS: 10.0000 mg | ORAL_TABLET | Freq: Four times a day (QID) | ORAL | Status: DC

## 2014-07-01 NOTE — Telephone Encounter (Signed)
Left message for patient that RX is ready for pickup

## 2014-07-27 ENCOUNTER — Telehealth: Payer: Self-pay | Admitting: Family Medicine

## 2014-07-27 NOTE — Telephone Encounter (Signed)
Pt needs new rx oxycodone. Pt will pick up on friday

## 2014-07-28 NOTE — Telephone Encounter (Signed)
Refill by March 4th.

## 2014-07-28 NOTE — Telephone Encounter (Signed)
Last visit 02/26/14 Last refill 07/01/14 #120 0 refill

## 2014-07-30 MED ORDER — OXYCODONE HCL 10 MG PO TABS: 10.0000 mg | ORAL_TABLET | Freq: Four times a day (QID) | ORAL | Status: DC

## 2014-07-30 NOTE — Telephone Encounter (Signed)
Pt is aware that Rx is ready for pickup  

## 2014-08-26 ENCOUNTER — Telehealth: Payer: Self-pay | Admitting: Family Medicine

## 2014-08-26 NOTE — Telephone Encounter (Signed)
Not due until April 3rd

## 2014-08-26 NOTE — Telephone Encounter (Signed)
° ° ° ° ° °

## 2014-08-26 NOTE — Telephone Encounter (Signed)
Last visit 02/26/14 Last refill 07/30/14 #120 0 refill

## 2014-08-27 MED ORDER — OXYCODONE HCL 10 MG PO TABS: 10.0000 mg | ORAL_TABLET | Freq: Four times a day (QID) | ORAL | Status: DC

## 2014-08-27 NOTE — Telephone Encounter (Signed)
Left message on patient VM that Rx is ready for pickup

## 2014-09-07 ENCOUNTER — Other Ambulatory Visit: Payer: Self-pay | Admitting: Family Medicine

## 2014-09-07 NOTE — Telephone Encounter (Signed)
Refill for 6 months. 

## 2014-09-07 NOTE — Telephone Encounter (Signed)
Last visit 02/26/14 Last refill 02/16/14 #30 5 refill

## 2014-09-24 ENCOUNTER — Telehealth: Payer: Self-pay | Admitting: Family Medicine

## 2014-09-24 NOTE — Telephone Encounter (Signed)
Last visit 02/26/14 Last refill 08/27/14 #120 0 refill

## 2014-09-24 NOTE — Telephone Encounter (Signed)
Pt would like new rx oxycodone

## 2014-09-26 NOTE — Telephone Encounter (Signed)
Refill OK

## 2014-09-27 MED ORDER — OXYCODONE HCL 10 MG PO TABS: 10.0000 mg | ORAL_TABLET | Freq: Four times a day (QID) | ORAL | Status: DC

## 2014-09-27 NOTE — Telephone Encounter (Signed)
Left message for patient on Vm. Rx is ready for pickup

## 2014-10-21 ENCOUNTER — Telehealth: Payer: Self-pay | Admitting: Family Medicine

## 2014-10-21 NOTE — Telephone Encounter (Signed)
May refill June 1st.

## 2014-10-21 NOTE — Telephone Encounter (Signed)
Patient would like a re-fill on Oxycodone HCl 10 MG TABS.  She wanted to call now due to the holiday on Monday.

## 2014-10-21 NOTE — Telephone Encounter (Signed)
Last visit 02/26/14 Last refill 09/27/14 #120 0 refill

## 2014-10-27 MED ORDER — OXYCODONE HCL 10 MG PO TABS: 10.0000 mg | ORAL_TABLET | Freq: Four times a day (QID) | ORAL | Status: DC

## 2014-10-27 NOTE — Telephone Encounter (Signed)
Pt aware that RX is ready for pick up  

## 2014-11-06 ENCOUNTER — Other Ambulatory Visit: Payer: Self-pay | Admitting: Family Medicine

## 2014-11-22 ENCOUNTER — Telehealth: Payer: Self-pay | Admitting: Family Medicine

## 2014-11-22 NOTE — Telephone Encounter (Signed)
Patient would like a re-fill on Oxycodone HCl 10 MG TABS and wants to know if she can get it on Thursday because she is going out of town on Friday? She is aware Dr. Elease Hashimoto is out of the office and it would have to go through another provider.

## 2014-11-25 MED ORDER — OXYCODONE HCL 10 MG PO TABS: 10.0000 mg | ORAL_TABLET | Freq: Four times a day (QID) | ORAL | Status: DC

## 2014-11-25 NOTE — Telephone Encounter (Signed)
Called and left message for patient on Vm that Rx is ready for pickup

## 2014-12-24 ENCOUNTER — Encounter: Payer: Self-pay | Admitting: Family Medicine

## 2014-12-24 ENCOUNTER — Ambulatory Visit (INDEPENDENT_AMBULATORY_CARE_PROVIDER_SITE_OTHER): Payer: Self-pay | Admitting: Family Medicine

## 2014-12-24 VITALS — BP 118/70 | HR 78 | Temp 98.3°F | Wt 150.0 lb

## 2014-12-24 DIAGNOSIS — M797 Fibromyalgia: Secondary | ICD-10-CM

## 2014-12-24 DIAGNOSIS — E785 Hyperlipidemia, unspecified: Secondary | ICD-10-CM

## 2014-12-24 DIAGNOSIS — R300 Dysuria: Secondary | ICD-10-CM

## 2014-12-24 LAB — POCT URINALYSIS DIPSTICK
Blood, UA: NEGATIVE
GLUCOSE UA: NEGATIVE
Ketones, UA: NEGATIVE
Nitrite, UA: NEGATIVE
PROTEIN UA: NEGATIVE
SPEC GRAV UA: 1.015
Urobilinogen, UA: 0.2
pH, UA: 6.5

## 2014-12-24 MED ORDER — OXYCODONE HCL 10 MG PO TABS: 10.0000 mg | ORAL_TABLET | Freq: Four times a day (QID) | ORAL | Status: DC

## 2014-12-24 NOTE — Progress Notes (Signed)
Pre visit review using our clinic review tool, if applicable. No additional management support is needed unless otherwise documented below in the visit note. 

## 2014-12-24 NOTE — Progress Notes (Signed)
   Subjective:    Patient ID: Toni Collins, female    DOB: 1965/10/05, 49 y.o.   MRN: [id number]  HPI Patient for the following issues  Here for [facility] form completion. She had singular episode of falling asleep driving back on 52/77/8242. She's had no episodes since then. This followed working through the night and long period of sleep deprivation. He's had no history of syncope and no history of seizure or narcolepsy. She is not complaining of any daytime somnolence.  She has long history of fibromyalgia which has been very difficult to treat. She remains on Lyrica and had been tried on many other medications as well-but either had intolerance or no relief. She's been on chronic oxycodone with some chronic back pain and fibromyalgia. Generally, her myalgias have been fairly well controlled. She has lost some weight this year due to her efforts and overall feels well.  History of depression followed by psychiatry on Paxil and stable  She complains of 3 month history of "foul odor" from her urine. No burning with urination. No fevers other than one episode of 103 week ago for less than a day but none since then. Never had any burning with urination  Past Medical History  Diagnosis Date  . INSOMNIA, CHRONIC 09/02/2008  . GRIEF REACTION 09/30/2009  . DEPRESSION 09/02/2008  . PREDIABETES 03/10/2009  . Colon polyps   . Fibromyalgia    Past Surgical History  Procedure Laterality Date  . Abdominal hysterectomy  2002    TAH  . Tmj arthroplasty      x2  . Diagnostic laparoscopy  1993, 1994, 1998, 2000    x4    reports that she has never smoked. She does not have any smokeless tobacco history on file. She reports that she does not drink alcohol. Her drug history is not on file. family history includes Arthritis in her mother; Diabetes in her maternal grandmother. There is no history of Depression. No Known Allergies    Review of Systems  Constitutional: Negative for fatigue.  Eyes:  Negative for visual disturbance.  Respiratory: Negative for cough, chest tightness, shortness of breath and wheezing.   Cardiovascular: Negative for chest pain, palpitations and leg swelling.  Genitourinary: Positive for dysuria.  Musculoskeletal: Positive for myalgias and arthralgias.  Neurological: Negative for dizziness, seizures, syncope, weakness, light-headedness and headaches.       Objective:   Physical Exam  Constitutional: She is oriented to person, place, and time. She appears well-developed and well-nourished.  Neck: Neck supple. No thyromegaly present.  Cardiovascular: Normal rate and regular rhythm.   Pulmonary/Chest: Effort normal and breath sounds normal. No respiratory distress. She has no wheezes. She has no rales.  Musculoskeletal: She exhibits no edema.  Neurological: She is alert and oriented to person, place, and time. No cranial nerve deficit. Coordination normal.  Psychiatric: She has a normal mood and affect. Her behavior is normal.          Assessment & Plan:  #1 fibromyalgia. Stable.  Continue current medications #2 dysuria. Rule out infection #3 hyperlipidemia. Continue Lipitor. Will plan to recheck lipids at follow-up in a few months #4 DMV form completion.  Pt had episode of likely falling asleep while driving back in 3536 with no episodes since then.  No hx of seizure or syncope.

## 2014-12-26 LAB — URINE CULTURE: Colony Count: 30000

## 2014-12-28 ENCOUNTER — Telehealth: Payer: Self-pay | Admitting: Family Medicine

## 2014-12-28 NOTE — Telephone Encounter (Signed)
Pt call to say that Dr Elease Hashimoto gave her the name of a doctor for her daughter to see about her foot but she forgot the name. She called to ask if he could give her that name again

## 2014-12-29 NOTE — Telephone Encounter (Signed)
Left message on VM.

## 2014-12-29 NOTE — Telephone Encounter (Signed)
Dr Linna Hoff.  Or, Alvan.

## 2015-01-21 ENCOUNTER — Telehealth: Payer: Self-pay | Admitting: Family Medicine

## 2015-01-21 NOTE — Telephone Encounter (Signed)
Last visit 12/24/14 Last refill 12/24/14 #120 0 refill

## 2015-01-21 NOTE — Telephone Encounter (Signed)
Refill on 8-29.

## 2015-01-21 NOTE — Telephone Encounter (Signed)
Pt needs new rx oxycodone °

## 2015-01-24 MED ORDER — OXYCODONE HCL 10 MG PO TABS: 10.0000 mg | ORAL_TABLET | Freq: Four times a day (QID) | ORAL | Status: DC

## 2015-01-24 NOTE — Telephone Encounter (Signed)
Pt is aware that Rx is ready for pickup  

## 2015-02-23 ENCOUNTER — Encounter: Payer: Self-pay | Admitting: Family Medicine

## 2015-02-23 ENCOUNTER — Ambulatory Visit (INDEPENDENT_AMBULATORY_CARE_PROVIDER_SITE_OTHER): Payer: No Typology Code available for payment source | Admitting: Family Medicine

## 2015-02-23 VITALS — BP 116/84 | HR 96 | Temp 98.0°F | Ht 62.0 in | Wt 147.5 lb

## 2015-02-23 DIAGNOSIS — M791 Myalgia: Secondary | ICD-10-CM

## 2015-02-23 DIAGNOSIS — M797 Fibromyalgia: Secondary | ICD-10-CM

## 2015-02-23 DIAGNOSIS — G894 Chronic pain syndrome: Secondary | ICD-10-CM

## 2015-02-23 DIAGNOSIS — E785 Hyperlipidemia, unspecified: Secondary | ICD-10-CM

## 2015-02-23 DIAGNOSIS — M609 Myositis, unspecified: Secondary | ICD-10-CM

## 2015-02-23 DIAGNOSIS — M546 Pain in thoracic spine: Secondary | ICD-10-CM | POA: Diagnosis not present

## 2015-02-23 DIAGNOSIS — IMO0001 Reserved for inherently not codable concepts without codable children: Secondary | ICD-10-CM

## 2015-02-23 LAB — CBC WITH DIFFERENTIAL/PLATELET
BASOS PCT: 0.5 % (ref 0.0–3.0)
Basophils Absolute: 0 10*3/uL (ref 0.0–0.1)
EOS ABS: 0 10*3/uL (ref 0.0–0.7)
Eosinophils Relative: 0.6 % (ref 0.0–5.0)
HCT: 40 % (ref 36.0–46.0)
Hemoglobin: 13.2 g/dL (ref 12.0–15.0)
LYMPHS ABS: 1.7 10*3/uL (ref 0.7–4.0)
Lymphocytes Relative: 40.6 % (ref 12.0–46.0)
MCHC: 33 g/dL (ref 30.0–36.0)
MCV: 86.8 fl (ref 78.0–100.0)
MONO ABS: 0.2 10*3/uL (ref 0.1–1.0)
Monocytes Relative: 5.9 % (ref 3.0–12.0)
NEUTROS ABS: 2.2 10*3/uL (ref 1.4–7.7)
NEUTROS PCT: 52.4 % (ref 43.0–77.0)
PLATELETS: 247 10*3/uL (ref 150.0–400.0)
RBC: 4.61 Mil/uL (ref 3.87–5.11)
RDW: 13.6 % (ref 11.5–15.5)
WBC: 4.2 10*3/uL (ref 4.0–10.5)

## 2015-02-23 LAB — HEPATIC FUNCTION PANEL
ALT: 20 U/L (ref 0–35)
AST: 17 U/L (ref 0–37)
Albumin: 4.3 g/dL (ref 3.5–5.2)
Alkaline Phosphatase: 96 U/L (ref 39–117)
BILIRUBIN TOTAL: 0.5 mg/dL (ref 0.2–1.2)
Bilirubin, Direct: 0.1 mg/dL (ref 0.0–0.3)
Total Protein: 7.2 g/dL (ref 6.0–8.3)

## 2015-02-23 LAB — LIPID PANEL
CHOLESTEROL: 189 mg/dL (ref 0–200)
HDL: 68.6 mg/dL (ref >39.00)
LDL CALC: 108 mg/dL — AB (ref 0–99)
NonHDL: 120.88
Total CHOL/HDL Ratio: 3
Triglycerides: 66 mg/dL (ref 0.0–149.0)
VLDL: 13.2 mg/dL (ref 0.0–40.0)

## 2015-02-23 LAB — TSH: TSH: 4.77 u[IU]/mL — AB (ref 0.35–4.50)

## 2015-02-23 LAB — CK: CK TOTAL: 92 U/L (ref 7–177)

## 2015-02-23 LAB — SEDIMENTATION RATE: Sed Rate: 20 mm/hr (ref 0–22)

## 2015-02-23 MED ORDER — OXYCODONE HCL 10 MG PO TABS: 10.0000 mg | ORAL_TABLET | Freq: Four times a day (QID) | ORAL | Status: DC

## 2015-02-23 NOTE — Progress Notes (Signed)
   Subjective:    Patient ID: Toni Collins, female    DOB: [birth year], 49 y.o.   MRN: [id number]  HPI Patient has long history of fibromyalgia. She states for the past couple months she's had increasing back pain somewhat poorly localized from her lower thoracic to upper lumbar area. Denies injury. No radiculopathy symptoms. She denies any appetite or weight changes. No fever or chills. No skin rash. Pain worse with back flexion. No alleviating factors.  She has had some chronic back pain the past as well as fibromyalgia. She has history of recurrent depression. She is followed by psychiatry. She had been on many medications in the past including tramadol, amitriptyline, Cymbalta, muscle relaxers, Tylenol, non-steroidal's without relief. She states her pain is very poorly controlled at times. Her recent back pain radiates bilaterally into soft tissue in her lower thoracic area. No abdominal pain.  Hyperlipidemia treated with Lipitor. She is due for follow-up lab work.  Her depression is being treated with Paxil per psychiatry and she feels that is relatively stable. She does have chronic poor sleep quality  Past Medical History  Diagnosis Date  . INSOMNIA, CHRONIC 09/02/2008  . GRIEF REACTION 09/30/2009  . DEPRESSION 09/02/2008  . PREDIABETES 03/10/2009  . Colon polyps   . Fibromyalgia    Past Surgical History  Procedure Laterality Date  . Abdominal hysterectomy  2002    TAH  . Tmj arthroplasty      x2  . Diagnostic laparoscopy  1993, 1994, 1998, 2000    x4    reports that she has never smoked. She does not have any smokeless tobacco history on file. She reports that she does not drink alcohol. Her drug history is not on file. family history includes Arthritis in her mother; Diabetes in her maternal grandmother. There is no history of Depression. No Known Allergies    Review of Systems  Constitutional: Positive for fatigue. Negative for fever, chills, appetite change and  unexpected weight change.  Respiratory: Negative for cough and shortness of breath.   Cardiovascular: Negative for chest pain.  Gastrointestinal: Negative for abdominal pain.  Genitourinary: Negative for dysuria.  Musculoskeletal: Positive for myalgias and back pain.  Hematological: Negative for adenopathy. Does not bruise/bleed easily.       Objective:   Physical Exam  Constitutional: She appears well-developed and well-nourished. No distress.  Cardiovascular: Normal rate and regular rhythm.   Pulmonary/Chest: Effort normal and breath sounds normal. No respiratory distress. She has no wheezes. She has no rales.  Musculoskeletal: She exhibits no edema.  Straight leg raise are negative. She has some mild nonspecific tenderness from her mid thoracic to lumbar area. She is able to flex and extend the back without difficulty.  Neurological:  Full-strength lower extremities. Symmetric reflexes.  Skin: No rash noted.          Assessment & Plan:  #1 poorly localized thoracic and lumbar back pain. This sounds to be more soft tissue related. We explained this would not be compatible with her fibromyalgia symptoms. Check further labs with sedimentation rate, CBC, chemistries, creatinine kinase. We've encouraged her to hold her Lipitor for the time being. Consider thoracic and lumbar spine films if the above normal #2 hyperlipidemia. Check lipid and hepatic panel as above #3 fibromyalgia. She remains on high-dose liver which helps. She did not tolerate Cymbalta. She has been on muscle relaxer such as Flexeril in the past which did not help.

## 2015-02-23 NOTE — Progress Notes (Signed)
Pre visit review using our clinic review tool, if applicable. No additional management support is needed unless otherwise documented below in the visit note. 

## 2015-02-25 ENCOUNTER — Other Ambulatory Visit: Payer: Self-pay | Admitting: Family Medicine

## 2015-02-25 DIAGNOSIS — E039 Hypothyroidism, unspecified: Secondary | ICD-10-CM

## 2015-03-08 ENCOUNTER — Other Ambulatory Visit: Payer: Self-pay | Admitting: Family Medicine

## 2015-03-23 ENCOUNTER — Telehealth: Payer: Self-pay | Admitting: Family Medicine

## 2015-03-23 NOTE — Telephone Encounter (Signed)
Pt last visit 02/23/15 and last Rx refill 02/23/15 #120

## 2015-03-23 NOTE — Telephone Encounter (Signed)
Pt needs new rx oxycodone °

## 2015-03-23 NOTE — Telephone Encounter (Signed)
May refill by Friday-the 28th.

## 2015-03-25 MED ORDER — OXYCODONE HCL 10 MG PO TABS: 10.0000 mg | ORAL_TABLET | Freq: Four times a day (QID) | ORAL | Status: DC

## 2015-04-01 ENCOUNTER — Other Ambulatory Visit: Payer: Self-pay | Admitting: Family Medicine

## 2015-04-04 NOTE — Telephone Encounter (Signed)
Refill with 5 additional refills. 

## 2015-04-20 ENCOUNTER — Telehealth: Payer: Self-pay | Admitting: Family Medicine

## 2015-04-20 MED ORDER — OXYCODONE HCL 10 MG PO TABS: ORAL_TABLET | ORAL | Status: DC

## 2015-04-20 NOTE — Telephone Encounter (Signed)
Toni Collins is aware that RX is up front for her to pick up. DNFD of 04/25/2015.

## 2015-04-20 NOTE — Telephone Encounter (Signed)
Okay to print with a DNF date of 11/28?

## 2015-04-20 NOTE — Telephone Encounter (Signed)
Yes- and I would definitely put in not to refill until the 28th.

## 2015-04-20 NOTE — Telephone Encounter (Signed)
Ms. Cardinas called saying her medication (Oxycodone) is "due" on Monday, 11.28.16 but she's going out of town on Friday for a funeral and won't return until next Wednesday. She's wondering if she can pick up the Rx  Today. She'd like a phone call regarding this.  Pt ph# [phone] Thank you.

## 2015-05-03 ENCOUNTER — Ambulatory Visit: Payer: No Typology Code available for payment source | Admitting: Family Medicine

## 2015-05-24 ENCOUNTER — Ambulatory Visit: Payer: No Typology Code available for payment source | Admitting: Family Medicine

## 2015-05-26 ENCOUNTER — Telehealth: Payer: Self-pay | Admitting: Family Medicine

## 2015-05-26 MED ORDER — OXYCODONE HCL 10 MG PO TABS: ORAL_TABLET | ORAL | Status: DC

## 2015-05-26 NOTE — Telephone Encounter (Signed)
OK 

## 2015-05-26 NOTE — Telephone Encounter (Signed)
Last refill 04-25-2015 #120 Seen in office 02-23-2015 Please advise if okay to refill Thanks

## 2015-05-26 NOTE — Telephone Encounter (Signed)
° ° ° ° ° °

## 2015-05-26 NOTE — Telephone Encounter (Signed)
Printed for dr. Elease Hashimoto to sign.

## 2015-05-26 NOTE — Telephone Encounter (Signed)
Pt is aware that RX is signed and up front.

## 2015-05-27 ENCOUNTER — Ambulatory Visit: Payer: No Typology Code available for payment source | Admitting: Family Medicine

## 2015-06-03 ENCOUNTER — Ambulatory Visit (INDEPENDENT_AMBULATORY_CARE_PROVIDER_SITE_OTHER): Payer: No Typology Code available for payment source | Admitting: Family Medicine

## 2015-06-03 ENCOUNTER — Encounter: Payer: Self-pay | Admitting: Family Medicine

## 2015-06-03 VITALS — BP 120/84 | HR 101 | Temp 98.1°F | Ht 62.0 in | Wt 157.8 lb

## 2015-06-03 DIAGNOSIS — R7989 Other specified abnormal findings of blood chemistry: Secondary | ICD-10-CM

## 2015-06-03 DIAGNOSIS — E785 Hyperlipidemia, unspecified: Secondary | ICD-10-CM | POA: Diagnosis not present

## 2015-06-03 DIAGNOSIS — M545 Low back pain: Secondary | ICD-10-CM

## 2015-06-03 DIAGNOSIS — J019 Acute sinusitis, unspecified: Secondary | ICD-10-CM

## 2015-06-03 LAB — LIPID PANEL
CHOL/HDL RATIO: 3
Cholesterol: 204 mg/dL — ABNORMAL HIGH (ref 0–200)
HDL: 63.1 mg/dL (ref >39.00)
LDL Cholesterol: 127 mg/dL — ABNORMAL HIGH (ref 0–99)
NONHDL: 140.59
Triglycerides: 66 mg/dL (ref 0.0–149.0)
VLDL: 13.2 mg/dL (ref 0.0–40.0)

## 2015-06-03 LAB — TSH: TSH: 3.45 u[IU]/mL (ref 0.35–4.50)

## 2015-06-03 MED ORDER — AMOXICILLIN-POT CLAVULANATE 875-125 MG PO TABS: 1.0000 | ORAL_TABLET | Freq: Two times a day (BID) | ORAL | Status: DC

## 2015-06-03 NOTE — Progress Notes (Signed)
Pre visit review using our clinic review tool, if applicable. No additional management support is needed unless otherwise documented below in the visit note. 

## 2015-06-03 NOTE — Progress Notes (Signed)
Subjective:    Patient ID: Toni Collins, female    DOB: 12-Oct-1965, 50 y.o.   MRN: [id number]  HPI Patient here to follow-up and discuss several items as follows  Recent abnormal TSH. Mildly elevated 4.77. She has some fatigue. No constipation. No known family history of hypothyroidism. Never treated.  Recent new acute issue of 2 to three-week history of pansinusitis symptoms especially frontal sinuses. Bloody nasal discharge. Some yellowish nasal discharge. No fevers or chills. Not relieved with over-the-counter medications  Long-standing history of fibromyalgia and chronic pain syndrome. She's had some chronic back pains but recently over several months has had some progressive mid lumbar back pain and lower thoracic pain. Denies any appetite change or any weight loss. No fevers or chills. Occasional pain radiating down the right lower extremity but inconsistent. No numbness or weakness. Symptoms have been somewhat progressive over several months. We discussed possible x-rays if symptoms persist. Recent blood work was unremarkable  Hyperlipidemia. Recently stopped her statin and requesting repeat lipids off statin. Her back pains did not improve any when she stopped the statin.  Past Medical History  Diagnosis Date  . INSOMNIA, CHRONIC 09/02/2008  . GRIEF REACTION 09/30/2009  . DEPRESSION 09/02/2008  . PREDIABETES 03/10/2009  . Colon polyps   . Fibromyalgia    Past Surgical History  Procedure Laterality Date  . Abdominal hysterectomy  2002    TAH  . Tmj arthroplasty      x2  . Diagnostic laparoscopy  1993, 1994, 1998, 2000    x4    reports that she has never smoked. She does not have any smokeless tobacco history on file. She reports that she does not drink alcohol. Her drug history is not on file. family history includes Arthritis in her mother; Diabetes in her maternal grandmother. There is no history of Depression. No Known Allergies]    Review of Systems    Constitutional: Negative for fever and chills.  Respiratory: Negative for cough and shortness of breath.   Cardiovascular: Negative for chest pain, palpitations and leg swelling.  Gastrointestinal: Negative for abdominal pain and constipation.  Endocrine: Negative for cold intolerance.  Musculoskeletal: Positive for back pain. Negative for arthralgias.  Skin: Negative for rash.  Neurological: Negative for dizziness and headaches.  Hematological: Negative for adenopathy.       Objective:   Physical Exam  Constitutional: She appears well-developed and well-nourished.  Cardiovascular: Normal rate.  Exam reveals no gallop.   No murmur heard. Pulmonary/Chest: Effort normal. No respiratory distress. She has no wheezes. She has no rales.  Musculoskeletal: She exhibits no edema.  No spinal tenderness thoracic or lumbar spine. Straight leg raise are negative. No lower extremity edema  Neurological:  Symmetric reflexes knee and ankle bilaterally. No focal strength deficits.          Assessment & Plan:   #1 recent abnormal TSH-slightly elevated, without overt symptoms of hypothyroidism. Recheck TSH. If climbing, consider  #2 hyperlipidemia. Recheck lipids. She has been off her statin for about 3 weeks and will likely need to reinitiate  #3 probable acute pansinusitis. Given duration of symptoms start Augmentin 825 mg twice daily for 10 days  #4 somewhat poorly localized lower thoracic and upper lumbar pain. She states this is different than her chronic fibromyalgia or other chronic back pain she's had previously. Recent lab work unremarkable. She is not have any red flag symptoms such as weight loss, fever, night sweats, etc. Given duration of symptoms we'll obtain plain  films lumbar and thoracic spine.

## 2015-06-08 ENCOUNTER — Ambulatory Visit (INDEPENDENT_AMBULATORY_CARE_PROVIDER_SITE_OTHER)
Admission: RE | Admit: 2015-06-08 | Discharge: 2015-06-08 | Disposition: A | Discharge status: Home / Self Care | Payer: No Typology Code available for payment source | Source: Ambulatory Visit | Attending: Family Medicine | Admitting: Family Medicine

## 2015-06-08 DIAGNOSIS — M545 Low back pain: Secondary | ICD-10-CM | POA: Diagnosis not present

## 2015-06-08 IMAGING — DX DG LUMBAR SPINE COMPLETE 4+V
5 series · 5 of 5 positions shown · non-contrast
Comparison: AP and lateral views of the lumbar spine October 13, 2007

CLINICAL DATA: Chronic low back pain with increasing symptoms over
the past several months common no report of injury

EXAM:
LUMBAR SPINE - COMPLETE 4+ VIEW

[l-spine ap]
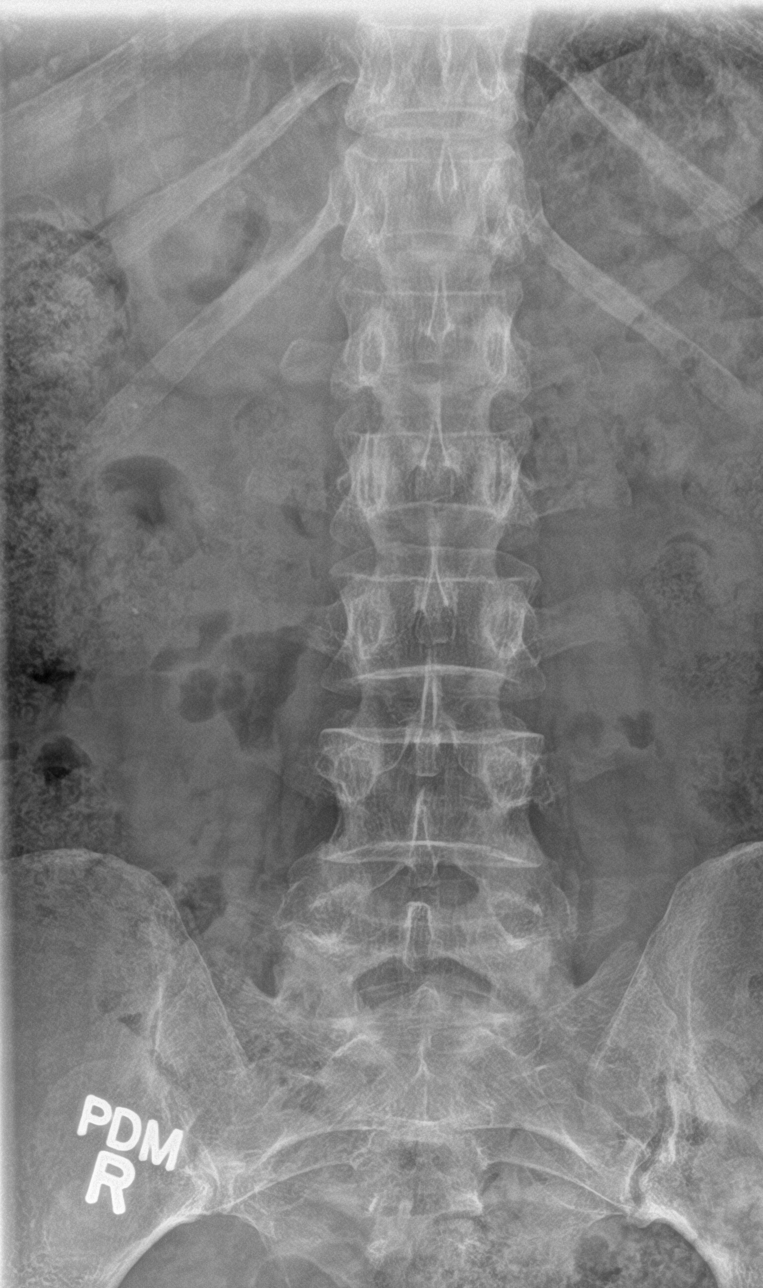

[l-spine obl (1 of 2)]
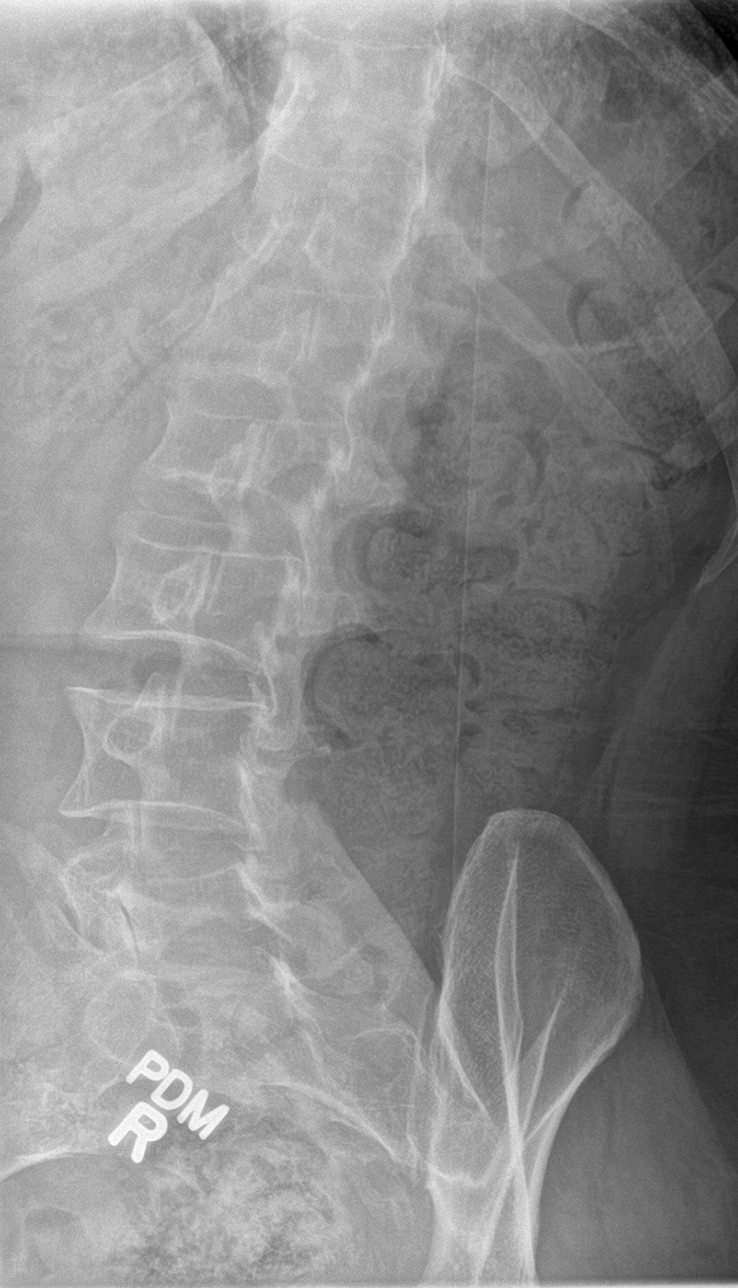

[l-spine obl (2 of 2)]
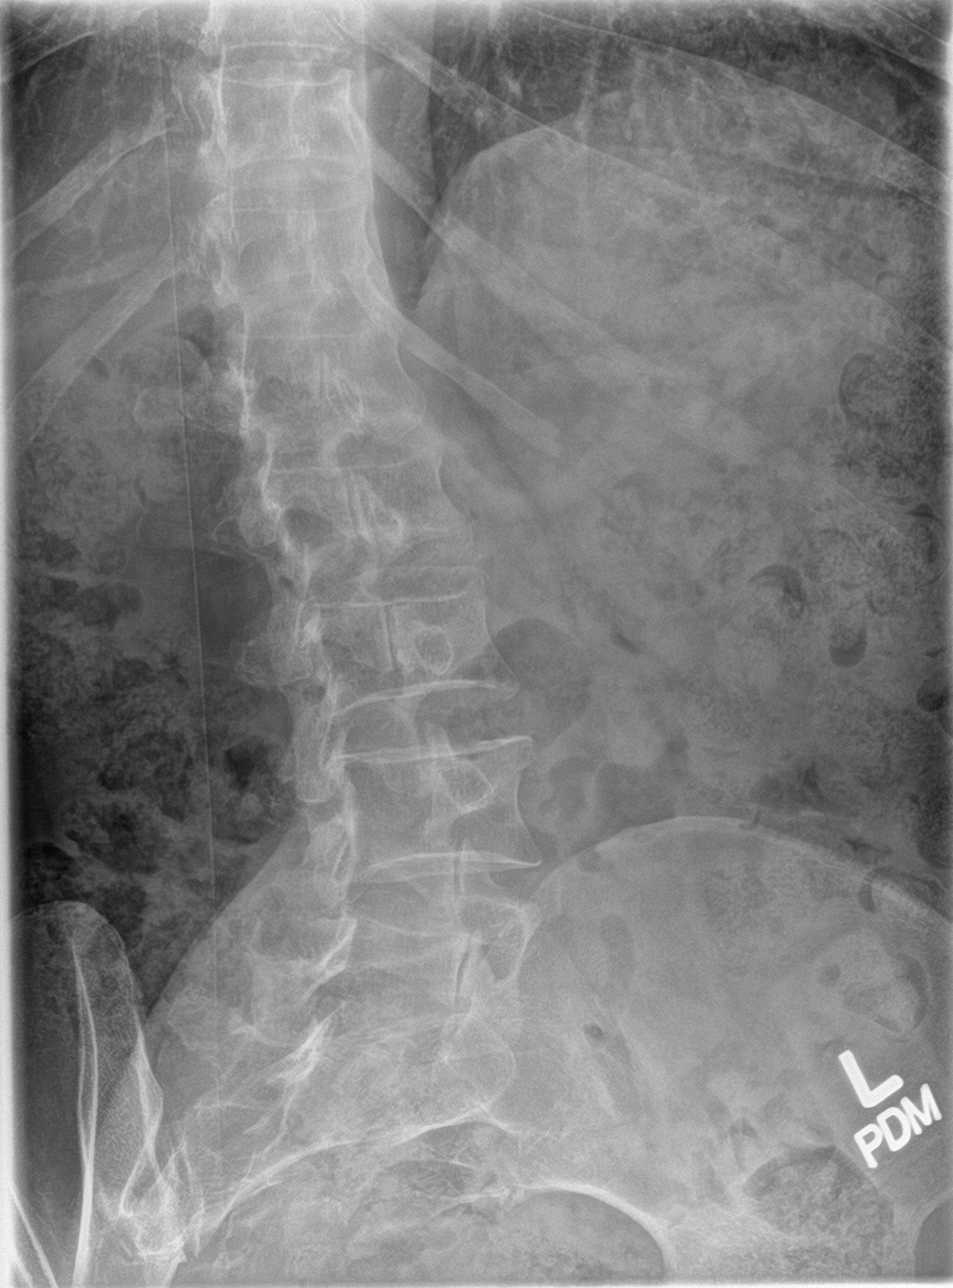

[l-spine lat]
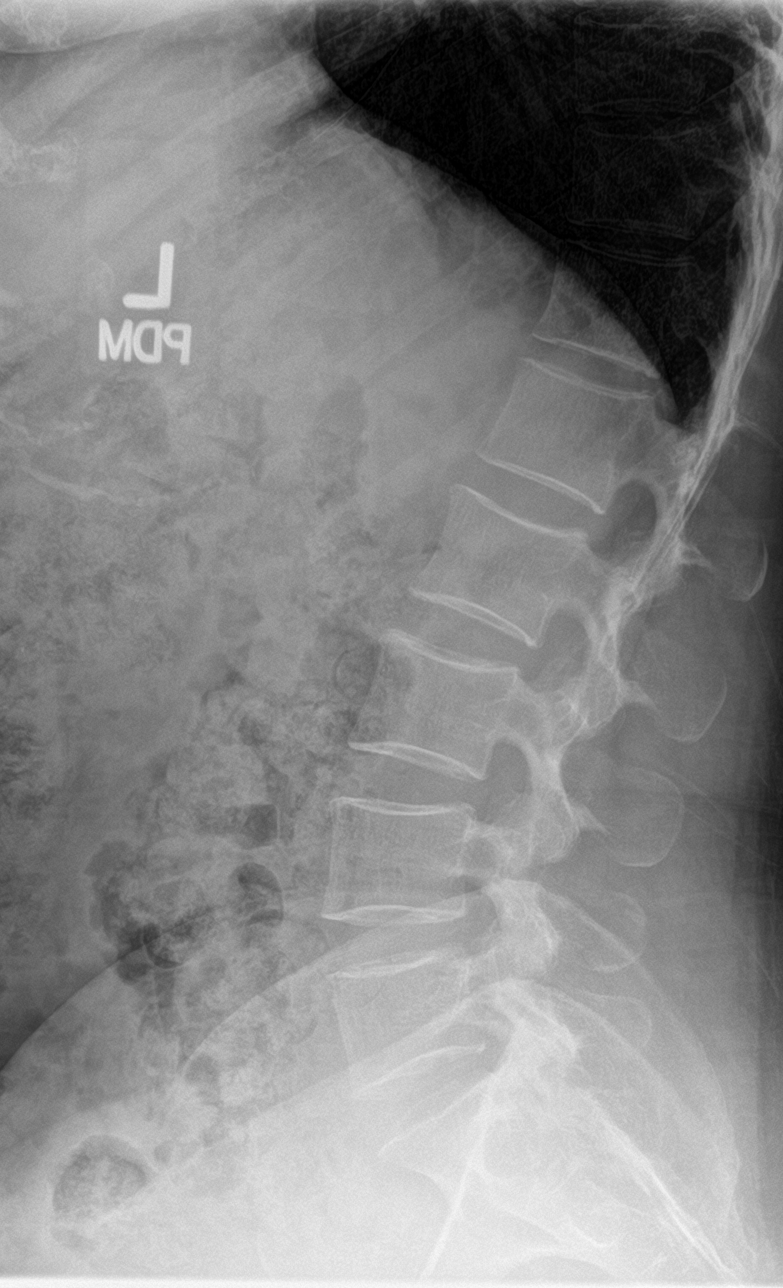

[l-spine spot]
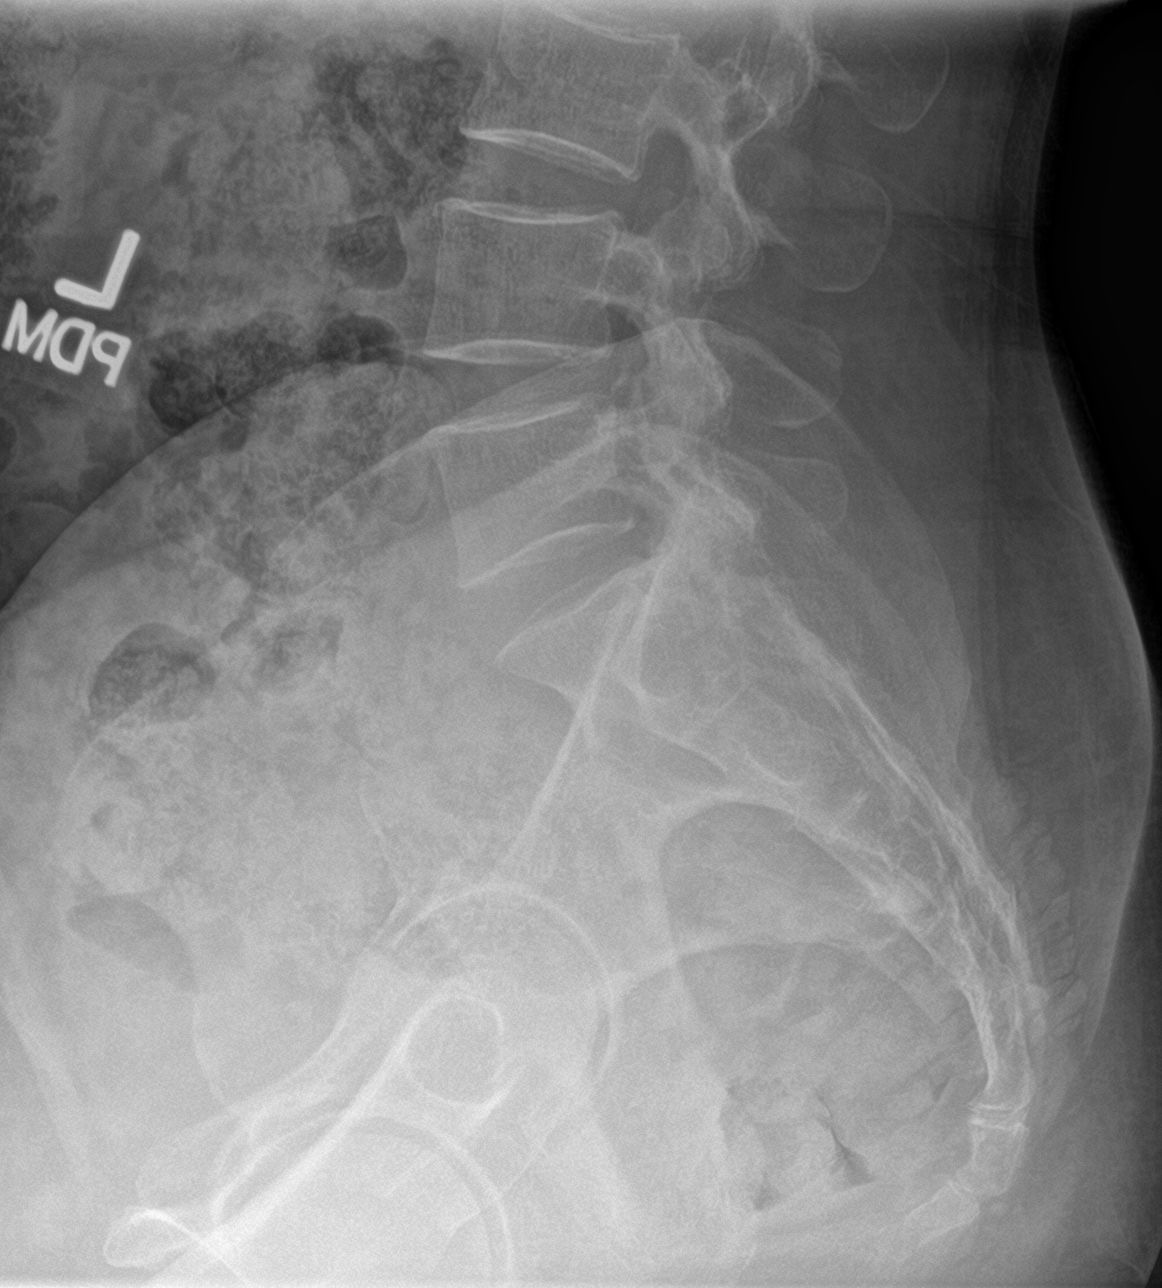

[5 of 5 positions shown; findings below may reference images not displayed]

FINDINGS: The lumbar vertebral bodies are preserved in height. The disc space
heights are well maintained. There is no spondylolisthesis. The
pedicles and transverse processes are intact. There is mild facet
joint hypertrophy bilaterally at L5-S1. The observed portions of the
sacrum are normal.
IMPRESSION: There is no acute bony abnormality of the lumbar spine. There is
mild degenerative facet joint hypertrophy at L5-S1.

## 2015-06-08 IMAGING — DX DG THORACIC SPINE 2V
3 series · 3 of 3 positions shown · non-contrast
Comparison: None in PACs

CLINICAL DATA: Generalized thoracic back pain for the past year. No
mention of injury.

EXAM:
THORACIC SPINE 2 VIEWS

[t-spine ap]
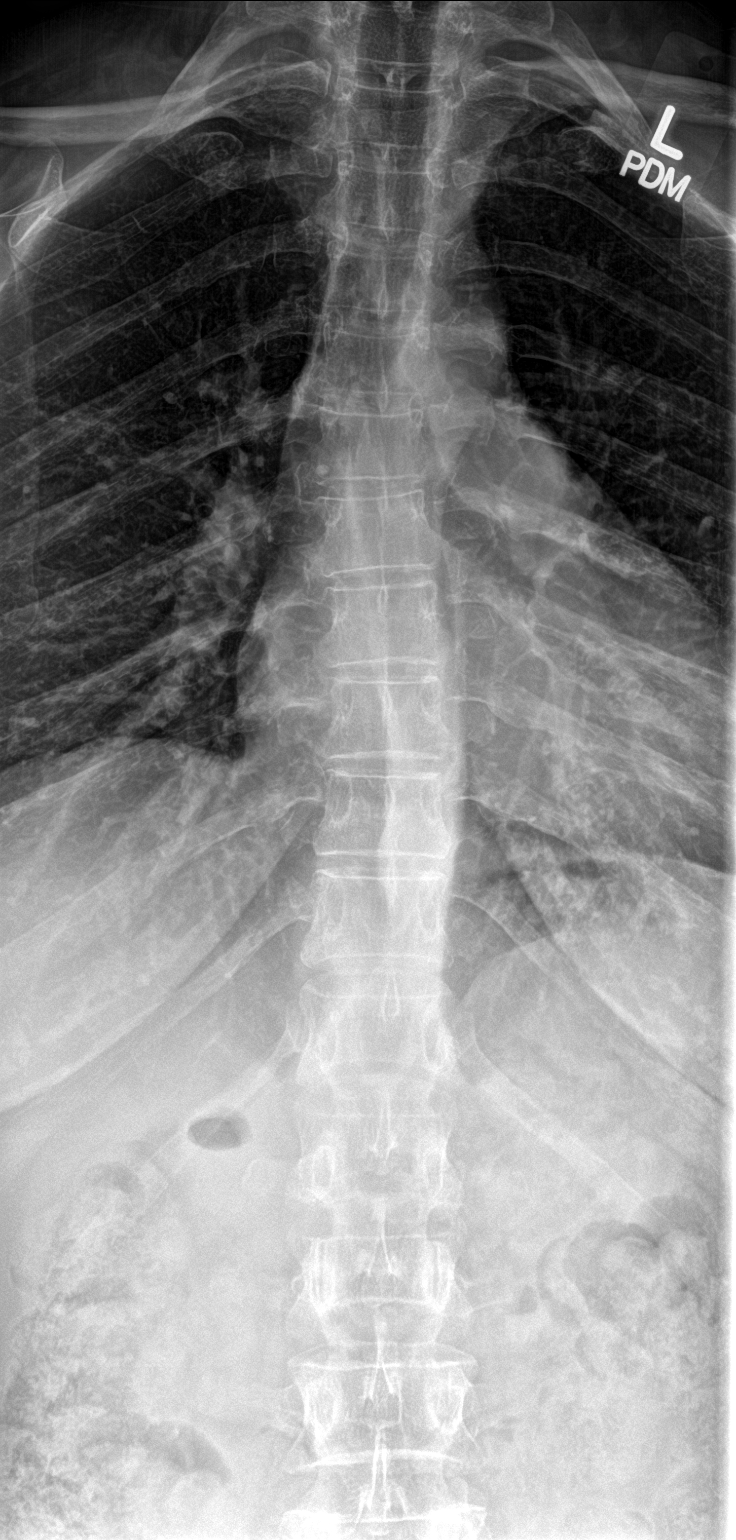

[t-spine lat]
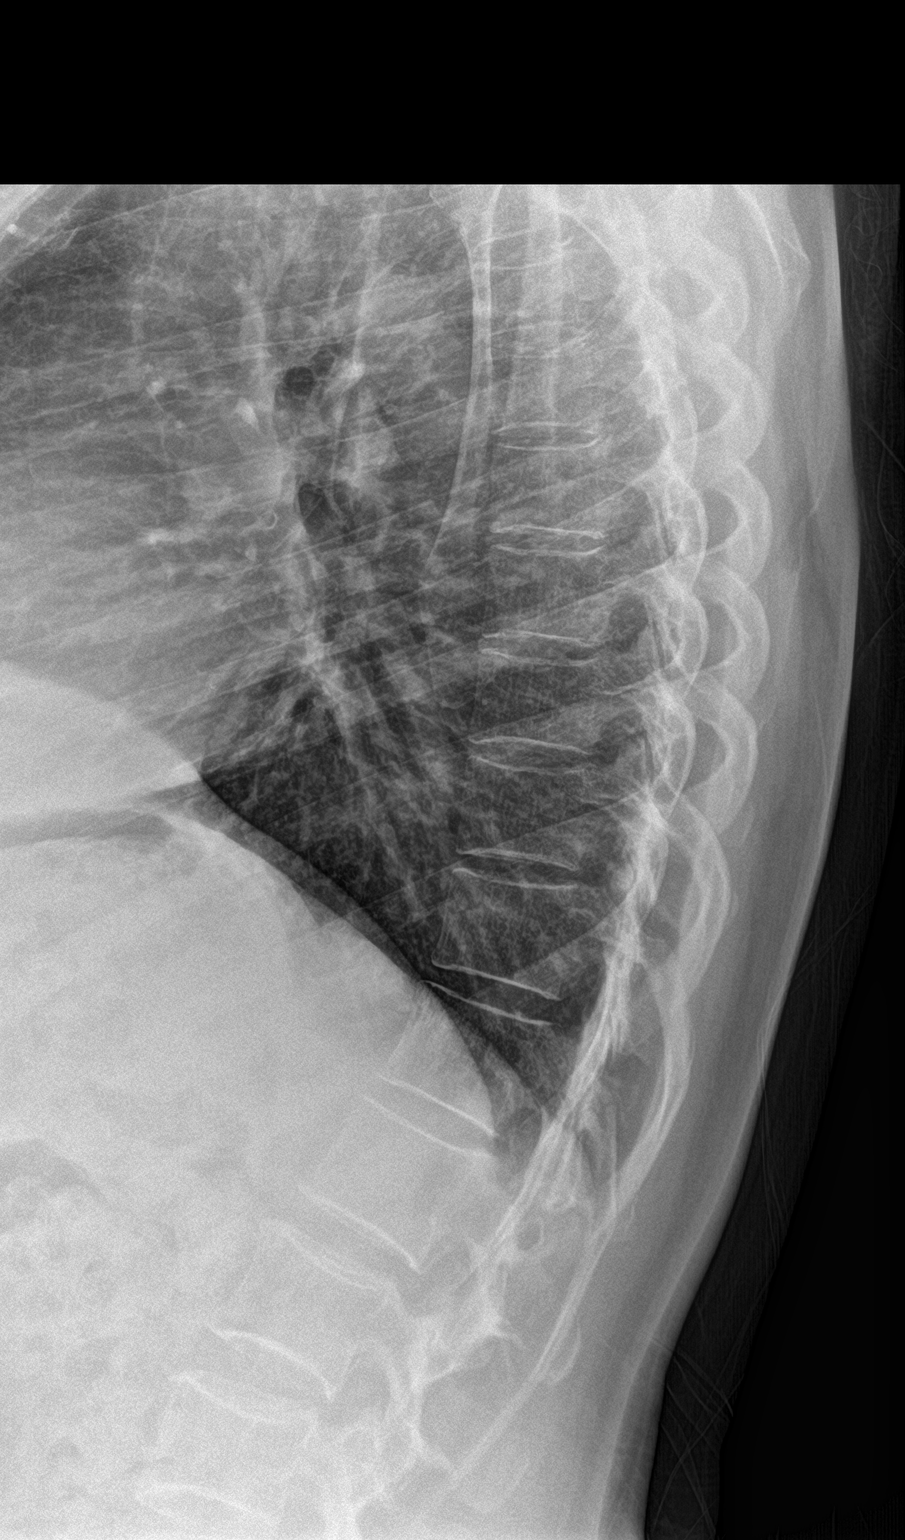

[swimmer]
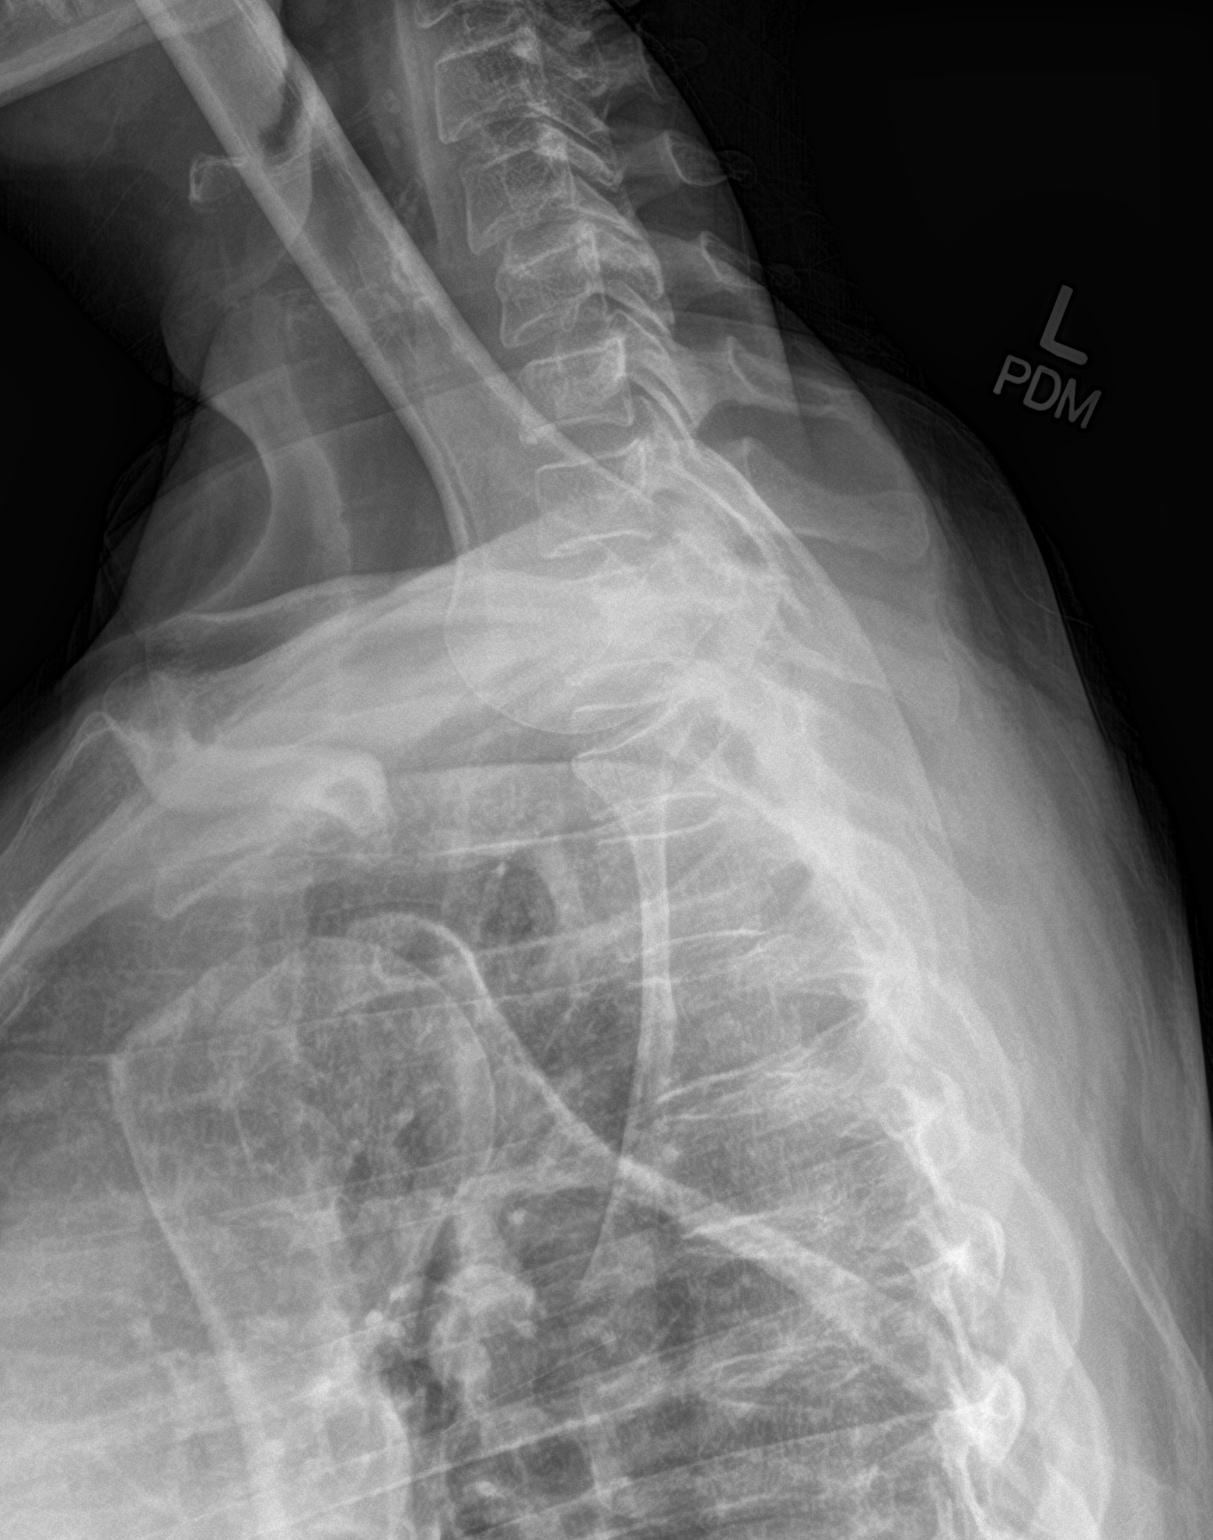

[3 of 3 positions shown; findings below may reference images not displayed]

FINDINGS: The thoracic vertebral bodies are preserved in height. There is
gentle S shaped thoracic scoliosis. The pedicles are intact. There
are no abnormal paravertebral soft tissue densities. The disc space
heights are well maintained.
IMPRESSION: Very mild S shaped thoracic scoliosis. No compression fracture or
significant disc space height loss is observed.

## 2015-06-22 ENCOUNTER — Other Ambulatory Visit: Payer: Self-pay | Admitting: Family Medicine

## 2015-06-22 NOTE — Telephone Encounter (Signed)
May refill by the 27th.

## 2015-06-22 NOTE — Telephone Encounter (Signed)
Pt recently seen on 05/26/2015 for acute visit No pending appt scheduled Last refill 05/26/2015 #120 Please advise

## 2015-06-22 NOTE — Telephone Encounter (Signed)
° ° ° ° ° °

## 2015-06-24 MED ORDER — OXYCODONE HCL 10 MG PO TABS: ORAL_TABLET | ORAL | Status: DC

## 2015-06-24 NOTE — Telephone Encounter (Signed)
rx ready for pick up and patient is aware  

## 2015-06-24 NOTE — Addendum Note (Signed)
Addended by: Westley Hummer B on: 06/24/2015 08:47 AM   Modules accepted: Orders

## 2015-07-22 ENCOUNTER — Other Ambulatory Visit: Payer: Self-pay | Admitting: Family Medicine

## 2015-07-22 NOTE — Telephone Encounter (Signed)
Due 2/27

## 2015-07-22 NOTE — Telephone Encounter (Signed)
° ° ° ° ° °

## 2015-07-25 MED ORDER — OXYCODONE HCL 10 MG PO TABS: ORAL_TABLET | ORAL | Status: DC

## 2015-07-25 NOTE — Telephone Encounter (Signed)
Rx ready for pick up and patient is aware 

## 2015-08-22 ENCOUNTER — Telehealth: Payer: Self-pay | Admitting: Family Medicine

## 2015-08-22 MED ORDER — OXYCODONE HCL 10 MG PO TABS: ORAL_TABLET | ORAL | Status: DC

## 2015-08-22 NOTE — Telephone Encounter (Signed)
Printed for refill.

## 2015-08-22 NOTE — Telephone Encounter (Signed)
Refill OK

## 2015-08-22 NOTE — Telephone Encounter (Signed)
Refilled last on 07/25/2015 Last seen on 06-03-2015 No pending appt scheduled Please advise

## 2015-08-22 NOTE — Telephone Encounter (Signed)
Pt needs new rx oxycodone °

## 2015-08-23 NOTE — Telephone Encounter (Signed)
Pt is aware that RX is up front for pick up. 

## 2015-09-22 ENCOUNTER — Telehealth: Payer: Self-pay | Admitting: Family Medicine

## 2015-09-22 NOTE — Telephone Encounter (Signed)
Pt needs new rx oxycodone °

## 2015-09-22 NOTE — Telephone Encounter (Signed)
Refill OK

## 2015-09-22 NOTE — Telephone Encounter (Signed)
Last refill 08/21/2016 Last OV 06/03/2015 No pending

## 2015-09-23 MED ORDER — OXYCODONE HCL 10 MG PO TABS: ORAL_TABLET | ORAL | Status: DC

## 2015-09-23 NOTE — Telephone Encounter (Signed)
Pt is aware via voicemail that RX is up front for pick up. 

## 2015-09-23 NOTE — Telephone Encounter (Signed)
Printed for signature

## 2015-09-28 ENCOUNTER — Ambulatory Visit (INDEPENDENT_AMBULATORY_CARE_PROVIDER_SITE_OTHER): Payer: No Typology Code available for payment source | Admitting: Family Medicine

## 2015-09-28 VITALS — BP 90/70 | HR 122 | Temp 98.1°F | Ht 62.0 in | Wt 158.4 lb

## 2015-09-28 DIAGNOSIS — J209 Acute bronchitis, unspecified: Secondary | ICD-10-CM

## 2015-09-28 MED ORDER — BENZONATATE 200 MG PO CAPS: 200.0000 mg | ORAL_CAPSULE | Freq: Three times a day (TID) | ORAL | Status: DC | PRN

## 2015-09-28 NOTE — Progress Notes (Signed)
   Subjective:    Patient ID: Toni Collins, female    DOB: 02-13-1966, 50 y.o.   MRN: [id number]  HPI Patient seen for acute respiratory illness. Onset about 3 days ago. Also sore throat and cough predominantly. Minimal nasal congestion. Cough mostly dry. Occasionally productive early morning. No fever or chills. Husband and daughter have had similar symptoms recently. Never smoked. Cough worse at night. No alleviating factors.  Past Medical History  Diagnosis Date  . INSOMNIA, CHRONIC 09/02/2008  . GRIEF REACTION 09/30/2009  . DEPRESSION 09/02/2008  . PREDIABETES 03/10/2009  . Colon polyps   . Fibromyalgia    Past Surgical History  Procedure Laterality Date  . Abdominal hysterectomy  2002    TAH  . Tmj arthroplasty      x2  . Diagnostic laparoscopy  1993, 1994, 1998, 2000    x4    reports that she has never smoked. She does not have any smokeless tobacco history on file. She reports that she does not drink alcohol. Her drug history is not on file. family history includes Arthritis in her mother; Diabetes in her maternal grandmother. There is no history of Depression. No Known Allergies    Review of Systems  Constitutional: Positive for fatigue. Negative for fever and chills.  HENT: Positive for congestion and sore throat.   Respiratory: Positive for cough. Negative for wheezing.   Cardiovascular: Negative for chest pain.       Objective:   Physical Exam  Constitutional: She appears well-developed and well-nourished.  HENT:  Right Ear: External ear normal.  Left Ear: External ear normal.  Mouth/Throat: Oropharynx is clear and moist.  Neck: Neck supple.  Cardiovascular: Normal rate and regular rhythm.   Pulmonary/Chest: Effort normal and breath sounds normal. No respiratory distress. She has no wheezes. She has no rales.  Lymphadenopathy:    She has no cervical adenopathy.          Assessment & Plan:  Acute upper respiratory infection with cough.  Suspect viral. Nonfocal exam. Tessalon Perles 200 mg every 8 hours as needed for cough. Follow-up for any fever or worsening symptoms. Also suggest over-the-counter Mucinex as needed  Eulas Post MD [facility] at [facility]

## 2015-09-28 NOTE — Progress Notes (Signed)
Pre visit review using our clinic review tool, if applicable. No additional management support is needed unless otherwise documented below in the visit note. 

## 2015-09-28 NOTE — Patient Instructions (Signed)

## 2015-10-25 ENCOUNTER — Telehealth: Payer: Self-pay | Admitting: Family Medicine

## 2015-10-25 MED ORDER — OXYCODONE HCL 10 MG PO TABS: ORAL_TABLET | ORAL | Status: DC

## 2015-10-25 NOTE — Telephone Encounter (Signed)
Pt needs new rx oxycodone. Pt is out and would like med by 12 noon

## 2015-10-25 NOTE — Telephone Encounter (Signed)
RX up front for pick up. Pt is aware.

## 2015-10-28 ENCOUNTER — Other Ambulatory Visit: Payer: Self-pay | Admitting: Family Medicine

## 2015-11-02 ENCOUNTER — Ambulatory Visit (INDEPENDENT_AMBULATORY_CARE_PROVIDER_SITE_OTHER): Payer: No Typology Code available for payment source | Admitting: Family Medicine

## 2015-11-02 ENCOUNTER — Encounter: Payer: Self-pay | Admitting: Family Medicine

## 2015-11-02 VITALS — BP 110/84 | HR 99 | Temp 98.3°F | Ht 62.0 in | Wt 158.0 lb

## 2015-11-02 DIAGNOSIS — G2581 Restless legs syndrome: Secondary | ICD-10-CM

## 2015-11-02 MED ORDER — PRAMIPEXOLE DIHYDROCHLORIDE 0.125 MG PO TABS: ORAL_TABLET | ORAL | Status: DC

## 2015-11-02 NOTE — Progress Notes (Signed)
   Subjective:    Patient ID: Toni Collins, female    DOB: 1965-10-21, 50 y.o.   MRN: [id number]  HPI Patient seen with concern for possible restless leg syndrome. Never diagnosed previously. She states she has had some issues with restless feeling in her legs previously but especially over the past week. She has difficulty sitting still. Symptoms are specially bothersome at night. She's noted some relief with taking a warm bath and also with getting up and walking. She frequently has a sensation of "crawling" of both legs. She's on Adderall per psychiatry and held this past few days but symptoms did not improve. Her job requires a lot of sitting.  She does have past history of anemia but most recent hemoglobin 13.2. Minimal caffeine use. Very rare alcohol use. No alcohol use recently. Denies any leg pain.  Past Medical History  Diagnosis Date  . INSOMNIA, CHRONIC 09/02/2008  . GRIEF REACTION 09/30/2009  . DEPRESSION 09/02/2008  . PREDIABETES 03/10/2009  . Colon polyps   . Fibromyalgia    Past Surgical History  Procedure Laterality Date  . Abdominal hysterectomy  2002    TAH  . Tmj arthroplasty      x2  . Diagnostic laparoscopy  1993, 1994, 1998, 2000    x4    reports that she has never smoked. She does not have any smokeless tobacco history on file. She reports that she does not drink alcohol. Her drug history is not on file. family history includes Arthritis in her mother; Diabetes in her maternal grandmother. There is no history of Depression. No Known Allergies    Review of Systems  Constitutional: Negative for fever, chills, appetite change and unexpected weight change.  Respiratory: Negative for shortness of breath.   Cardiovascular: Negative for chest pain.  Neurological: Negative for dizziness, tremors, weakness and headaches.  Psychiatric/Behavioral: Negative for confusion.       Objective:   Physical Exam  Constitutional: She is oriented to person, place,  and time. She appears well-developed and well-nourished.  Cardiovascular: Normal rate and regular rhythm.   Pulmonary/Chest: Effort normal and breath sounds normal. No respiratory distress. She has no wheezes. She has no rales.  Musculoskeletal: She exhibits no edema.  Neurological: She is alert and oriented to person, place, and time. She has normal reflexes. No cranial nerve deficit. Coordination normal.          Assessment & Plan:  Probable restless leg syndrome. No history of recent anemia. We discussed importance of minimizing caffeine use. Symptoms are extremely bothersome and she's had difficulty sleeping past few nights because of this. Leave off Ambien. Mirapex 0.125 mg 1 daily at bedtime. After 4-7 nights may increase to 0.25 mg if not relieved with the above. Reassess in 2 weeks.  Eulas Post MD [facility] at [facility]

## 2015-11-02 NOTE — Patient Instructions (Signed)
Restless Legs Syndrome Restless legs syndrome is a condition that causes uncomfortable feelings or sensations in the legs, especially while sitting or lying down. The sensations usually cause an overwhelming urge to move the legs. The arms can also sometimes be affected. The condition can range from mild to severe. The symptoms often interfere with a person's ability to sleep. CAUSES The cause of this condition is not known. RISK FACTORS This condition is more likely to develop in:  People who are older than age 51.  Pregnant women. In general, restless legs syndrome is more common in women than in men.  People who have a family history of the condition.  People who have certain medical conditions, such as iron deficiency, kidney disease, Parkinson disease, or nerve damage.  People who take certain medicines, such as medicines for high blood pressure, nausea, colds, allergies, depression, and some heart conditions. SYMPTOMS The main symptom of this condition is uncomfortable sensations in the legs. These sensations may be:  Described as pulling, tingling, prickling, throbbing, crawling, or burning.  Worse while you are sitting or lying down.  Worse during periods of rest or inactivity.  Worse at night, often interfering with your sleep.  Accompanied by a very strong urge to move your legs.  Temporarily relieved by movement of your legs. The sensations usually affect both sides of the body. The arms can also be affected, but this is rare. People who have this condition often have tiredness during the day because of their lack of sleep at night. DIAGNOSIS This condition may be diagnosed based on your description of the symptoms. You may also have tests, including blood tests, to check for other conditions that may lead to your symptoms. In some cases, you may be asked to spend some time in a sleep lab so your sleeping can be monitored. TREATMENT Treatment for this condition is  focused on managing the symptoms. Treatment may include:  Self-help and lifestyle changes.  Medicines. HOME CARE INSTRUCTIONS  Take medicines only as directed by your health care provider.  Try these methods to get temporary relief from the uncomfortable sensations:  Massage your legs.  Walk or stretch.  Take a cold or hot bath.  Practice good sleep habits. For example, go to bed and get up at the same time every day.  Exercise regularly.  Practice ways of relaxing, such as yoga or meditation.  Avoid caffeine and alcohol.  Do not use any tobacco products, including cigarettes, chewing tobacco, or electronic cigarettes. If you need help quitting, ask your health care provider.  Keep all follow-up visits as directed by your health care provider. This is important. SEEK MEDICAL CARE IF: Your symptoms do not improve with treatment, or they get worse.   This information is not intended to replace advice given to you by your health care provider. Make sure you discuss any questions you have with your health care provider.   Document Released: 05/04/2002 Document Revised: 09/28/2014 Document Reviewed: 05/10/2014 Elsevier Interactive Patient Education 2016 Time Mirapex 0.125 mg one at night After 4-7 days if not better, may increase to two at night.

## 2015-11-21 ENCOUNTER — Telehealth: Payer: Self-pay | Admitting: Family Medicine

## 2015-11-21 ENCOUNTER — Telehealth: Payer: Self-pay

## 2015-11-21 MED ORDER — NITROFURANTOIN MONOHYD MACRO 100 MG PO CAPS: 100.0000 mg | ORAL_CAPSULE | Freq: Two times a day (BID) | ORAL | Status: DC

## 2015-11-21 NOTE — Telephone Encounter (Signed)
Duplicate message. 

## 2015-11-21 NOTE — Telephone Encounter (Signed)
Which antibiotic is she taking from home?

## 2015-11-21 NOTE — Addendum Note (Signed)
Addended by: Elio Forget on: 11/21/2015 05:21 PM   Modules accepted: Orders

## 2015-11-21 NOTE — Telephone Encounter (Signed)
Spoke with patient to follow up. She states she is no longer seeing blood today but is still having some burning when she urinates. She states she is taking an antibiotic she had left over at home. She currently has no insurance and stated she couldn't afford to come in without insurance.     [facility] Night - Client [place] Patient Name: Toni Collins Gender: Female DOB: May 05, 1966 Age: 50 Y 86 M 2 D Return Phone Number: [phone] (Primary), [phone] (Secondary) Address[place] [address] Client [facility] Night - Client Client Site [facility] - Night Physician Carolann Littler - MD Contact Type Call Who Is Calling Patient / Member / Family / Caregiver Call Type Triage / Clinical Relationship To Patient Self Return Phone Number [phone] (Primary) Chief Complaint Urine, Blood In Reason for Call Symptomatic / Request for [place] states she has a UTI. Not able to get it to go away. Blood in urine. PreDisposition Did not know what to do Translation No Nurse Assessment Nurse: Levada Dy, RN, Mariann Laster Date/Time (Eastern Time): 11/20/2015 9:27:45 PM Confirm and document reason for call. If symptomatic, describe symptoms. You must click the next button to save text entered. ---Caller states has UTI and she has blood in urine , no fever. Has the patient traveled out of the country within the last 30 days? ---No Does the patient have any new or worsening symptoms? ---Yes Will a triage be completed? ---Yes Related visit to physician within the last 2 weeks? ---No Does the PT have any chronic conditions? (i.e. diabetes, asthma, etc.) ---No Is the patient pregnant or possibly pregnant? (Ask all females between the ages of 57-55) ---No Is this a behavioral health or substance abuse call? ---No Guidelines Guideline  Title Affirmed Question Affirmed Notes Nurse Date/Time (Eastern Time) Urine - Blood In Pain or burning with passing urine [place], RN, Mariann Laster 11/20/2015 9:29:37 PM Disp. Time Eilene Ghazi Time) Disposition Final User 11/20/2015 9:32:39 PM See Physician within 24 Hours Yes Levada Dy, RN, Laurey Morale Understands: Yes Disagree/Comply: Comply Care Advice Given Per Guideline SEE PHYSICIAN WITHIN 24 HOURS: * IF OFFICE WILL BE OPEN: You need to be seen within the next 24 hours. Call your doctor when the office opens, and make an appointment. CALL BACK IF: * Fever occurs * You become worse. CARE ADVICE given per Urine, Blood In (Adult) guideline. Referrals REFERRED TO PCP OFFICE

## 2015-11-21 NOTE — Telephone Encounter (Signed)
Called pt to let her know the below message.  Pt state she does not have insurance at this present time.

## 2015-11-21 NOTE — Telephone Encounter (Signed)
Pt took 1 tablet of Augmentin last PM.

## 2015-11-21 NOTE — Telephone Encounter (Signed)
Medication sent in. 

## 2015-11-21 NOTE — Telephone Encounter (Signed)
Pt needs an appt for any antibiotics.

## 2015-11-21 NOTE — Telephone Encounter (Addendum)
Pt would like to have a antibiotic for bladder infection.  Pharm:  [facility]  Let pt know she needs to come in for an appointment pt refuse appointment.

## 2015-11-21 NOTE — Telephone Encounter (Signed)
macrobid one po bid for 3 days #6 and she will need follow up if not better in 2-3 days.

## 2015-11-23 ENCOUNTER — Telehealth: Payer: Self-pay | Admitting: Family Medicine

## 2015-11-23 NOTE — Telephone Encounter (Signed)
Pt would like new rx oxycodone °

## 2015-11-23 NOTE — Telephone Encounter (Signed)
Last OV 11/02/2015  Last refill #120 10/25/2015 Please advise if okay to fill friday

## 2015-11-23 NOTE — Telephone Encounter (Signed)
Script will be printed Friday 6/30.

## 2015-11-23 NOTE — Telephone Encounter (Signed)
OK 

## 2015-11-25 MED ORDER — OXYCODONE HCL 10 MG PO TABS: ORAL_TABLET | ORAL | Status: DC

## 2015-11-25 NOTE — Telephone Encounter (Signed)
Printed, signed, and up front for pick up. Pt is aware.

## 2015-12-05 ENCOUNTER — Ambulatory Visit (INDEPENDENT_AMBULATORY_CARE_PROVIDER_SITE_OTHER): Payer: Self-pay | Admitting: Family Medicine

## 2015-12-05 VITALS — BP 100/80 | HR 155 | Temp 98.4°F | Ht 62.0 in | Wt 159.0 lb

## 2015-12-05 DIAGNOSIS — R197 Diarrhea, unspecified: Secondary | ICD-10-CM

## 2015-12-05 DIAGNOSIS — R1084 Generalized abdominal pain: Secondary | ICD-10-CM

## 2015-12-05 LAB — POCT URINALYSIS DIPSTICK
BILIRUBIN UA: NEGATIVE
GLUCOSE UA: NEGATIVE
KETONES UA: NEGATIVE
LEUKOCYTES UA: NEGATIVE
Nitrite, UA: NEGATIVE
PROTEIN UA: NEGATIVE
SPEC GRAV UA: 1.01
UROBILINOGEN UA: 0.2
pH, UA: 7

## 2015-12-05 MED ORDER — ONDANSETRON 4 MG PO TBDP: 4.0000 mg | ORAL_TABLET | Freq: Three times a day (TID) | ORAL | Status: DC | PRN

## 2015-12-05 NOTE — Progress Notes (Signed)
Pre visit review using our clinic review tool, if applicable. No additional management support is needed unless otherwise documented below in the visit note. 

## 2015-12-05 NOTE — Patient Instructions (Signed)

## 2015-12-05 NOTE — Progress Notes (Signed)
   Subjective:    Patient ID: Toni Collins, female    DOB: 05/26/1966, 49 y.o.   MRN: [id number]  HPI Patient seen for acute visit Onset Saturday of some nausea without vomiting and diarrhea She states she had about 4 watery to loose nonbloody stools yesterday. None today. Keeping down fluids. Poor appetite. No fever. Diffuse abdominal cramps intermittently. No dysuria currently though she did have episode of probable UTI recently. She was treated with 3 days of antibiotic and symptoms fully resolved. No sick contacts  Past Medical History  Diagnosis Date  . INSOMNIA, CHRONIC 09/02/2008  . GRIEF REACTION 09/30/2009  . DEPRESSION 09/02/2008  . PREDIABETES 03/10/2009  . Colon polyps   . Fibromyalgia    Past Surgical History  Procedure Laterality Date  . Abdominal hysterectomy  2002    TAH  . Tmj arthroplasty      x2  . Diagnostic laparoscopy  1993, 1994, 1998, 2000    x4    reports that she has never smoked. She does not have any smokeless tobacco history on file. She reports that she does not drink alcohol. Her drug history is not on file. family history includes Arthritis in her mother; Diabetes in her maternal grandmother. There is no history of Depression. No Known Allergies]     Review of Systems  Constitutional: Positive for fatigue. Negative for fever and chills.  Gastrointestinal: Positive for nausea, abdominal pain and diarrhea. Negative for vomiting and blood in stool.       Objective:   Physical Exam  Constitutional: She appears well-developed and well-nourished.  HENT:  Mouth/Throat: Oropharynx is clear and moist.  Cardiovascular: Normal rate and regular rhythm.   Pulmonary/Chest: Effort normal and breath sounds normal. No respiratory distress. She has no wheezes. She has no rales.  Abdominal: Soft. Bowel sounds are normal. She exhibits no distension and no mass. There is no tenderness. There is no rebound and no guarding.          Assessment &  Plan:  Probable viral gastroenteritis. She does not appear dehydrated. Zofran 4 mg every 8 hours as needed for nausea and vomiting. Handout given on appropriate diet. Would not take Imodium unless symptoms severe because of her chronic opioid use. Touch base if not improving over the next day or 2  Eulas Post MD [facility]

## 2015-12-14 ENCOUNTER — Ambulatory Visit (INDEPENDENT_AMBULATORY_CARE_PROVIDER_SITE_OTHER): Payer: Self-pay | Admitting: Family Medicine

## 2015-12-14 DIAGNOSIS — R110 Nausea: Secondary | ICD-10-CM

## 2015-12-14 LAB — HEPATIC FUNCTION PANEL
ALBUMIN: 4.2 g/dL (ref 3.5–5.2)
ALK PHOS: 83 U/L (ref 39–117)
ALT: 19 U/L (ref 0–35)
AST: 19 U/L (ref 0–37)
BILIRUBIN TOTAL: 0.6 mg/dL (ref 0.2–1.2)
Bilirubin, Direct: 0.2 mg/dL (ref 0.0–0.3)
Total Protein: 7.4 g/dL (ref 6.0–8.3)

## 2015-12-14 LAB — CBC WITH DIFFERENTIAL/PLATELET
BASOS ABS: 0 10*3/uL (ref 0.0–0.1)
Basophils Relative: 0.5 % (ref 0.0–3.0)
EOS ABS: 0.1 10*3/uL (ref 0.0–0.7)
Eosinophils Relative: 1 % (ref 0.0–5.0)
HCT: 39.2 % (ref 36.0–46.0)
Hemoglobin: 13.2 g/dL (ref 12.0–15.0)
LYMPHS ABS: 1.2 10*3/uL (ref 0.7–4.0)
Lymphocytes Relative: 22.8 % (ref 12.0–46.0)
MCHC: 33.8 g/dL (ref 30.0–36.0)
MCV: 84.9 fl (ref 78.0–100.0)
MONO ABS: 0.3 10*3/uL (ref 0.1–1.0)
MONOS PCT: 6.3 % (ref 3.0–12.0)
NEUTROS ABS: 3.6 10*3/uL (ref 1.4–7.7)
NEUTROS PCT: 69.4 % (ref 43.0–77.0)
PLATELETS: 222 10*3/uL (ref 150.0–400.0)
RBC: 4.62 Mil/uL (ref 3.87–5.11)
RDW: 13 % (ref 11.5–15.5)
WBC: 5.2 10*3/uL (ref 4.0–10.5)

## 2015-12-14 LAB — BASIC METABOLIC PANEL
BUN: 16 mg/dL (ref 6–23)
CALCIUM: 9.6 mg/dL (ref 8.4–10.5)
CO2: 25 meq/L (ref 19–32)
CREATININE: 0.78 mg/dL (ref 0.40–1.20)
Chloride: 106 mEq/L (ref 96–112)
GFR: 83.91 mL/min (ref >60.00)
GLUCOSE: 89 mg/dL (ref 70–99)
Potassium: 4 mEq/L (ref 3.5–5.1)
SODIUM: 139 meq/L (ref 135–145)

## 2015-12-14 LAB — LIPASE: LIPASE: 49 U/L (ref 11.0–59.0)

## 2015-12-14 MED ORDER — PROMETHAZINE HCL 25 MG RE SUPP: 25.0000 mg | Freq: Four times a day (QID) | RECTAL | Status: DC | PRN

## 2015-12-14 MED ORDER — PROMETHAZINE HCL 50 MG/ML IJ SOLN
50.0000 mg | Freq: Once | INTRAMUSCULAR | Status: AC
  Administered 2015-12-14: 50 mg via INTRAMUSCULAR

## 2015-12-14 NOTE — Progress Notes (Signed)
Subjective:     Patient ID: Toni Collins, female   DOB: 15-May-1966, 50 y.o.   MRN: [id number]  HPI   Patient seen with severe nausea for the past 10 days or so. Was seen recently with diarrhea and diarrhea symptoms have fully resolved. She has relatively constant nausea without vomiting. Denies any associated fever, headache, abdominal pain, constipation or other change in bowel habits. No recent change of medication. Patient had abdominal ultrasound 2014 no gallstones. She is eating some food and keeping some fluids down but poor appetite. She states she's lost about 7 pounds but actually her weight is unchanged (by our scales) from back in June. She's been using Zofran 4 mg without any improvement in her nausea.  Past Medical History  Diagnosis Date  . INSOMNIA, CHRONIC 09/02/2008  . GRIEF REACTION 09/30/2009  . DEPRESSION 09/02/2008  . PREDIABETES 03/10/2009  . Colon polyps   . Fibromyalgia    Past Surgical History  Procedure Laterality Date  . Abdominal hysterectomy  2002    TAH  . Tmj arthroplasty      x2  . Diagnostic laparoscopy  1993, 1994, 1998, 2000    x4    reports that she has never smoked. She does not have any smokeless tobacco history on file. She reports that she does not drink alcohol. Her drug history is not on file. family history includes Arthritis in her mother; Diabetes in her maternal grandmother. There is no history of Depression. No Known Allergies   Review of Systems  Constitutional: Positive for appetite change. Negative for fever and chills.  Respiratory: Negative for shortness of breath.   Cardiovascular: Negative for chest pain.  Gastrointestinal: Positive for nausea. Negative for vomiting, abdominal pain, diarrhea, constipation, blood in stool and abdominal distention.  Genitourinary: Negative for dysuria.  Neurological: Negative for dizziness and headaches.       Objective:   Physical Exam  Constitutional: She appears well-developed  and well-nourished.  Neck: Neck supple.  Cardiovascular: Regular rhythm.   Pulmonary/Chest: Effort normal and breath sounds normal. No respiratory distress. She has no wheezes.  Abdominal: Soft. Bowel sounds are normal. She exhibits no distension and no mass. There is no rebound and no guarding.  Minimal tenderness right upper quadrant to deep palpation. No masses. No guarding. Normal bowel sounds  Musculoskeletal: She exhibits no edema.  Lymphadenopathy:    She has no cervical adenopathy.  Skin: No rash noted.       Assessment:     Severe nausea. Patient had recent diarrhea with presumed viral illness. Her diarrhea has fully resolved but now she has nausea without vomiting. Etiology unclear. Minimal right upper quadrant abdominal pain with normal ultrasound 2014    Plan:     -Phenergan 25 mg IM given as she's having severe nausea at this time -Obtain lab work with CBC, hepatic panel, lipase, basic metabolic panel -Phenergan 25 mg suppositories one every 6 hours as needed for severe nausea and vomiting -Consider abdominal imaging with ultrasound depending on lab results  Eulas Post MD [facility] at [facility]

## 2015-12-14 NOTE — Patient Instructions (Signed)
Nausea, Adult °Nausea is the feeling that you have an upset stomach or have to vomit. Nausea by itself is not likely a serious concern, but it may be an early sign of more serious medical problems. As nausea gets worse, it can lead to vomiting. If vomiting develops, there is the risk of dehydration.  °CAUSES  °· Viral infections. °· Food poisoning. °· Medicines. °· Pregnancy. °· Motion sickness. °· Migraine headaches. °· Emotional distress. °· Severe pain from any source. °· Alcohol intoxication. °HOME CARE INSTRUCTIONS °· Get plenty of rest. °· Ask your caregiver about specific rehydration instructions. °· Eat small amounts of food and sip liquids more often. °· Take all medicines as told by your caregiver. °SEEK MEDICAL CARE IF: °· You have not improved after 2 days, or you get worse. °· You have a headache. °SEEK IMMEDIATE MEDICAL CARE IF:  °· You have a fever. °· You faint. °· You keep vomiting or have blood in your vomit. °· You are extremely weak or dehydrated. °· You have dark or bloody stools. °· You have severe chest or abdominal pain. °MAKE SURE YOU: °· Understand these instructions. °· Will watch your condition. °· Will get help right away if you are not doing well or get worse. °  °This information is not intended to replace advice given to you by your health care provider. Make sure you discuss any questions you have with your health care provider. °  °Document Released: 06/21/2004 Document Revised: 06/04/2014 Document Reviewed: 01/24/2011 °Elsevier Interactive Patient Education ©2016 Elsevier Inc[employer] ° °

## 2015-12-14 NOTE — Progress Notes (Signed)
Pre visit review using our clinic review tool, if applicable. No additional management support is needed unless otherwise documented below in the visit note. 

## 2015-12-26 ENCOUNTER — Telehealth: Payer: Self-pay | Admitting: Family Medicine

## 2015-12-26 NOTE — Telephone Encounter (Signed)
Last OV 12-14-2015 Last refill 11/25/2015 #120, 0rf Please advise

## 2015-12-26 NOTE — Telephone Encounter (Signed)
Refill OK

## 2015-12-26 NOTE — Telephone Encounter (Signed)
Pt needs new rx oxycodone °

## 2015-12-27 MED ORDER — OXYCODONE HCL 10 MG PO TABS
ORAL_TABLET | ORAL | 0 refills | Status: DC
Start: 2015-12-27 — End: 2016-01-27

## 2015-12-27 NOTE — Telephone Encounter (Signed)
Printed for signature

## 2015-12-27 NOTE — Telephone Encounter (Signed)
Pt is aware via voicemail that script is up front for pick up. 

## 2016-01-24 ENCOUNTER — Telehealth: Payer: Self-pay | Admitting: Family Medicine

## 2016-01-24 NOTE — Telephone Encounter (Signed)
Last refill 12/27/2015 #120  Last OV 12/14/2015  Please advise

## 2016-01-24 NOTE — Telephone Encounter (Signed)
Pt request refill  Oxycodone HCl 10 MG TABS  In one or 2 days if possible

## 2016-01-24 NOTE — Telephone Encounter (Signed)
Refill OK

## 2016-01-25 ENCOUNTER — Other Ambulatory Visit: Payer: Self-pay | Admitting: Emergency Medicine

## 2016-01-25 NOTE — Telephone Encounter (Signed)
DUE Friday FOR REFILL

## 2016-01-27 ENCOUNTER — Other Ambulatory Visit: Payer: Self-pay | Admitting: Emergency Medicine

## 2016-01-27 MED ORDER — OXYCODONE HCL 10 MG PO TABS: ORAL_TABLET | ORAL | 0 refills | Status: DC

## 2016-01-27 NOTE — Telephone Encounter (Signed)
Pt following up on refill request. Would like to pick up today. Please call when ready.

## 2016-01-27 NOTE — Telephone Encounter (Signed)
Called and spoke with pt informing her that prescription was printed and up front ready for pick-up. Pt verbalized understanding

## 2016-02-22 ENCOUNTER — Telehealth: Payer: Self-pay | Admitting: Family Medicine

## 2016-02-22 NOTE — Telephone Encounter (Signed)
Pt need new Rx for oxycodone

## 2016-02-22 NOTE — Telephone Encounter (Signed)
Last refill 01/27/2016 #120, 1rf Last OV 12/14/2015 Please advise

## 2016-02-23 NOTE — Telephone Encounter (Signed)
May refill by 9-29 with fill date of Oct 1.

## 2016-02-24 MED ORDER — OXYCODONE HCL 10 MG PO TABS
ORAL_TABLET | ORAL | 0 refills | Status: DC
Start: 2016-02-24 — End: 2016-03-27

## 2016-02-24 NOTE — Telephone Encounter (Signed)
Pt is aware that script is up front for pick via voicemail.

## 2016-02-24 NOTE — Telephone Encounter (Signed)
Printed for signature

## 2016-03-26 ENCOUNTER — Telehealth: Payer: Self-pay | Admitting: Family Medicine

## 2016-03-26 NOTE — Telephone Encounter (Signed)
Last refill 02/24/2016 #120 Please advise

## 2016-03-26 NOTE — Telephone Encounter (Signed)
Pt need new Rx for Oxycodone

## 2016-03-26 NOTE — Telephone Encounter (Signed)
Refill OK

## 2016-03-27 MED ORDER — OXYCODONE HCL 10 MG PO TABS: ORAL_TABLET | ORAL | 0 refills | Status: DC

## 2016-03-27 NOTE — Telephone Encounter (Signed)
Printed for signature

## 2016-03-27 NOTE — Telephone Encounter (Signed)
RX printed and up front for pick up. Pt is aware via voicemail.

## 2016-04-24 ENCOUNTER — Telehealth: Payer: Self-pay | Admitting: Family Medicine

## 2016-04-24 NOTE — Telephone Encounter (Signed)
Pt request refill °Oxycodone HCl 10 MG TABS °

## 2016-04-24 NOTE — Telephone Encounter (Signed)
yes

## 2016-04-24 NOTE — Telephone Encounter (Signed)
Last OV 12-14-2015 Last refill 03/27/2016 #120, 0rf There are only 30 days in the month. Okay to print tomorrow with a refill date of 04/27/2016?

## 2016-04-25 MED ORDER — OXYCODONE HCL 10 MG PO TABS: ORAL_TABLET | ORAL | 0 refills | Status: DC

## 2016-04-25 NOTE — Telephone Encounter (Signed)
Pt is aware that script is up front for pick up.  

## 2016-05-22 ENCOUNTER — Other Ambulatory Visit: Payer: Self-pay | Admitting: Family Medicine

## 2016-05-23 ENCOUNTER — Other Ambulatory Visit: Payer: Self-pay | Admitting: Emergency Medicine

## 2016-05-23 ENCOUNTER — Telehealth: Payer: Self-pay | Admitting: Family Medicine

## 2016-05-23 MED ORDER — OXYCODONE HCL 10 MG PO TABS: ORAL_TABLET | ORAL | 0 refills | Status: DC

## 2016-05-23 NOTE — Telephone Encounter (Signed)
Ignore previous annotation. Pt is aware that script is up front for pick up.

## 2016-05-23 NOTE — Telephone Encounter (Signed)
Pt is aware of results. 

## 2016-05-23 NOTE — Telephone Encounter (Signed)
Refill by 12-29

## 2016-05-23 NOTE — Telephone Encounter (Signed)
Last appt 12-14-2015 Last refill 04-25-2016 Please advise

## 2016-05-23 NOTE — Telephone Encounter (Signed)
Pt needs new oxycodone °

## 2016-05-24 ENCOUNTER — Telehealth: Payer: Self-pay | Admitting: Family Medicine

## 2016-05-24 NOTE — Telephone Encounter (Signed)
This medication is not currently on her med list.  I know that we had discussed trying to taper off.  Given the fact that she is on fairly high dose opioid (Oxycodone) and Lyrica, I feel that adding another sedative drug such as Ambien is not in her best interest.  Would not recommend going back on Ambien at this time.

## 2016-05-24 NOTE — Telephone Encounter (Signed)
Please advise. Medication is not on current medication list. 

## 2016-05-24 NOTE — Telephone Encounter (Signed)
Pt need new Rx for Ambien   Pharm:  [facility]

## 2016-05-25 NOTE — Telephone Encounter (Signed)
Noted! Thank you

## 2016-05-29 ENCOUNTER — Other Ambulatory Visit: Payer: Self-pay | Admitting: Emergency Medicine

## 2016-05-29 NOTE — Telephone Encounter (Signed)
Explained to pt Dr. Elease Hashimoto recommendations regarding Ambien. Pt states that she is having trouble sleeping since Thursday only able to sleep for a couple of hours. Pt states she did have refills but they have expired and I explained to pt that her refills should be good for a year. Also recommended that pt try OTC sleep aids to see if that would work.

## 2016-05-29 NOTE — Telephone Encounter (Signed)
We already responded to this request last week.  Because she is on fairly high dose Oxycodone and Lyrica, adding another CNS sedative poses higher risk.  We had discussed in past tapering off and she has not taken regularly.  Please let her know our rationale for stopping the Ambien.

## 2016-05-29 NOTE — Telephone Encounter (Signed)
Left a message for Toni Collins to give the office a call back regarding Dr. Elease Hashimoto notes.

## 2016-06-01 ENCOUNTER — Other Ambulatory Visit: Payer: Self-pay | Admitting: Emergency Medicine

## 2016-06-22 ENCOUNTER — Telehealth: Payer: Self-pay | Admitting: Family Medicine

## 2016-06-22 MED ORDER — OXYCODONE HCL 10 MG PO TABS: ORAL_TABLET | ORAL | 0 refills | Status: DC

## 2016-06-22 NOTE — Telephone Encounter (Signed)
Last OV 12-14-2015 Last refill 05/23/2016 #120, 0rf Please advise

## 2016-06-22 NOTE — Telephone Encounter (Signed)
Pt is aware that script will be up front for pick up.

## 2016-06-22 NOTE — Telephone Encounter (Signed)
May fill with refill date of 06-23-16

## 2016-06-22 NOTE — Telephone Encounter (Signed)
° °  Pt said she will not have enough med to take Monday 06/25/16 and is asking for a call back to discuss    Pt request refill of the following:  Oxycodone HCl 10 MG TABS   Phamacy:

## 2016-06-27 ENCOUNTER — Telehealth: Payer: Self-pay | Admitting: Family Medicine

## 2016-06-27 NOTE — Telephone Encounter (Signed)
Patient Name: Toni Collins DOB: 1966/01/06 Initial Comment Caller states, she had a hysterectomy several years ago - having vaginal bleeding ( heavy). She also had a little constipation. Straining and tearing concern. Verified Nurse Assessment Guidelines Guideline Title Affirmed Question Affirmed Notes Final Disposition User FINAL ATTEMPT MADE - message left Adrian Blackwater, RN, [employer]

## 2016-06-27 NOTE — Telephone Encounter (Signed)
Spoke with pt and she has not spoken with [facility]. She c/o recent vaginal bleeding, bright red with some small clots. She had complete hysterectomy 2002 and has had no complications since. She states she has had recent constipation, but that has resolved and bleeding remains. Appt scheduled with Dr Elease Hashimoto 06/29/16. Advised if bleeding increases or pt develops other symptoms such as lightheaded/dizzy or abdominal pain she needs to be seen immediately at ED or UC. Pt voiced understanding. Nothing further needed.

## 2016-06-29 ENCOUNTER — Ambulatory Visit: Payer: Self-pay | Admitting: Family Medicine

## 2016-07-20 ENCOUNTER — Telehealth: Payer: Self-pay | Admitting: Family Medicine

## 2016-07-20 NOTE — Telephone Encounter (Signed)
Last refill 06/22/2016 #120 Please advise if okay to fill 07/23/2016?

## 2016-07-20 NOTE — Telephone Encounter (Signed)
yes

## 2016-07-20 NOTE — Telephone Encounter (Signed)
Pt request refill °Oxycodone HCl 10 MG TABS °

## 2016-07-23 MED ORDER — OXYCODONE HCL 10 MG PO TABS: ORAL_TABLET | ORAL | 0 refills | Status: DC

## 2016-07-23 NOTE — Telephone Encounter (Signed)
Pt is aware that script is up front for pick up.  

## 2016-08-17 ENCOUNTER — Telehealth: Payer: Self-pay | Admitting: Family Medicine

## 2016-08-17 NOTE — Telephone Encounter (Signed)
Pt need new rx oxycodone

## 2016-08-17 NOTE — Telephone Encounter (Signed)
Last refill 07/23/16.  Last office visit 12/14/15.  Okay to fill?

## 2016-08-18 NOTE — Telephone Encounter (Signed)
Refill and set up office follow up within one month.

## 2016-08-20 MED ORDER — OXYCODONE HCL 10 MG PO TABS: ORAL_TABLET | ORAL | 0 refills | Status: DC

## 2016-08-20 NOTE — Telephone Encounter (Signed)
Pt aware needs appt.   Pt has been scheduled 09/17/16 and plans to pick up her rx in the am. Is that ok?

## 2016-08-20 NOTE — Telephone Encounter (Signed)
Rx ready for pick up. 

## 2016-08-20 NOTE — Telephone Encounter (Signed)
Left message on machine for patient to return our call 

## 2016-09-18 ENCOUNTER — Ambulatory Visit: Payer: Self-pay | Admitting: Family Medicine

## 2016-09-19 ENCOUNTER — Encounter: Payer: Self-pay | Admitting: Family Medicine

## 2016-09-19 ENCOUNTER — Ambulatory Visit (INDEPENDENT_AMBULATORY_CARE_PROVIDER_SITE_OTHER): Payer: Self-pay | Admitting: Family Medicine

## 2016-09-19 VITALS — BP 100/72 | HR 97 | Temp 98.9°F | Wt 155.0 lb

## 2016-09-19 DIAGNOSIS — M797 Fibromyalgia: Secondary | ICD-10-CM

## 2016-09-19 DIAGNOSIS — G894 Chronic pain syndrome: Secondary | ICD-10-CM

## 2016-09-19 MED ORDER — OXYCODONE HCL 10 MG PO TABS: ORAL_TABLET | ORAL | 0 refills | Status: DC

## 2016-09-19 NOTE — Progress Notes (Signed)
Subjective:     Patient ID: Toni Collins, female   DOB: 1966-03-22, 51 y.o.   MRN: [id number]  HPI Patient is here today to discuss chronic pain management. She has history of chronic pain syndrome and fibromyalgia. She's been on oxycodone for many years and came to me on that medication. She takes high-dose Lyrica per psychiatry and is also taking Paxil and Abilify. Her depression is relatively stable. She has ongoing fatigue issues. Husband on disability following stroke and this has caused some stress issues- especially financially.  She states her pain is "all over". She states she has pain from the time she steps down out of bed in the morning until she goes to bed and this has never been very well controlled on any medication. She had either intolerance or lack of improvement with previous medications including try cyclic antidepressants, gabapentin, and tramadol. She denies any constipation. She still states her pain is frequently 7-8 out of 10 even with medication.  No consistent exercise.  Past Medical History:  Diagnosis Date  . Colon polyps   . DEPRESSION 09/02/2008  . Fibromyalgia   . GRIEF REACTION 09/30/2009  . INSOMNIA, CHRONIC 09/02/2008  . PREDIABETES 03/10/2009   Past Surgical History:  Procedure Laterality Date  . ABDOMINAL HYSTERECTOMY  2002   TAH  . [place], 2000   x4  . TMJ ARTHROPLASTY     x2    reports that she has never smoked. She has never used smokeless tobacco. She reports that she does not drink alcohol. Her drug history is not on file. family history includes Arthritis in her mother; Diabetes in her maternal grandmother. No Known Allergies   Review of Systems  Constitutional: Negative for appetite change and unexpected weight change.  Respiratory: Negative for shortness of breath.   Cardiovascular: Negative for chest pain.  Gastrointestinal: Negative for abdominal pain and constipation.  Neurological: Negative for  dizziness and headaches.  Psychiatric/Behavioral: Negative for confusion.       Objective:   Physical Exam  Constitutional: She is oriented to person, place, and time. She appears well-developed and well-nourished. No distress.  Neck: Neck supple. No thyromegaly present.  Cardiovascular: Normal rate and regular rhythm.   Pulmonary/Chest: Effort normal and breath sounds normal. No respiratory distress. She has no wheezes. She has no rales.  Musculoskeletal: She exhibits no edema.  Neurological: She is alert and oriented to person, place, and time. No cranial nerve deficit.  Psychiatric: She has a normal mood and affect. Her behavior is normal. Judgment and thought content normal.       Assessment:     Chronic pain syndrome and fibromyalgia. Patient on chronic opioid with oxycodone    Plan:     -We had long discussion today regarding chronic management. We explained our concern that she may have hyperalgesia effects related to chronic opioid and have strongly advocated for tapering off oxycodone. We also discussed regulations requiring more stringent testing and closer follow-up. -We recommended as a first step she taper her oxycodone from 10 mg 4 times a day to 3 times a day. We'll plan follow-up in 2 months and consider further reducing milligrams of medication at that time. -Continue close follow-up with psychiatry. -We have explored multiple other pain treatment modalities in past- PT, exercise, Cymbalta, TCAs, Tramadol, NSAIDS, Tylenol without relief.  Eulas Post MD [place] at [facility]

## 2016-09-19 NOTE — Progress Notes (Signed)
Pre visit review using our clinic review tool, if applicable. No additional management support is needed unless otherwise documented below in the visit note. 

## 2016-10-10 ENCOUNTER — Telehealth: Payer: Self-pay | Admitting: Family Medicine

## 2016-10-10 NOTE — Telephone Encounter (Signed)
° ° ° °  Pt call to say she has been having night mares and her doctor gave her the below med and that might lower her BP and her BP she said is already low and is asking if Dr Elease Hashimoto thinks it is ok for her to take     ARIPiprazole (ABILIFY) 10 MG tablet

## 2016-10-10 NOTE — Telephone Encounter (Signed)
Patient is aware 

## 2016-10-10 NOTE — Telephone Encounter (Signed)
Can lower BP.  Depends on how severe her symptoms are.  If she is extremely dizzy, should let her psychiatrist know.

## 2016-10-16 ENCOUNTER — Ambulatory Visit: Payer: Self-pay | Admitting: Family Medicine

## 2016-10-17 ENCOUNTER — Encounter: Payer: Self-pay | Admitting: Family Medicine

## 2016-10-17 ENCOUNTER — Ambulatory Visit (INDEPENDENT_AMBULATORY_CARE_PROVIDER_SITE_OTHER): Payer: Self-pay | Admitting: Family Medicine

## 2016-10-17 VITALS — BP 110/70 | HR 82 | Temp 98.5°F | Wt 150.0 lb

## 2016-10-17 DIAGNOSIS — L259 Unspecified contact dermatitis, unspecified cause: Secondary | ICD-10-CM

## 2016-10-17 MED ORDER — METHYLPREDNISOLONE ACETATE 80 MG/ML IJ SUSP
80.0000 mg | INTRAMUSCULAR | Status: AC
  Administered 2016-10-17: 80 mg via INTRAMUSCULAR

## 2016-10-17 MED ORDER — OXYCODONE HCL 10 MG PO TABS: ORAL_TABLET | ORAL | 0 refills | Status: DC

## 2016-10-17 NOTE — Patient Instructions (Signed)
Poison Ivy Dermatitis Poison ivy dermatitis is inflammation of the skin that is caused by the allergens on the leaves of the poison ivy plant. The skin reaction often involves redness, swelling, blisters, and extreme itching. What are the causes? This condition is caused by a specific chemical (urushiol) found in the sap of the poison ivy plant. This chemical is sticky and can be easily spread to people, animals, and objects. You can get poison ivy dermatitis by:  Having direct contact with a poison ivy plant.  Touching animals, other people, or objects that have come in contact with poison ivy and have the chemical on them. What increases the risk? This condition is more likely to develop in:  People who are outdoors often.  People who go outdoors without wearing protective clothing, such as closed shoes, long pants, and a long-sleeved shirt. What are the signs or symptoms? Symptoms of this condition include:  Redness and itching.  A rash that often includes bumps and blisters. The rash usually appears 48 hours after exposure.  Swelling. This may occur if the reaction is more severe. Symptoms usually last for 1-2 weeks. However, the first time you develop this condition, symptoms may last 3-4 weeks. How is this diagnosed? This condition may be diagnosed based on your symptoms and a physical exam. Your health care provider may also ask you about any recent outdoor activity. How is this treated? Treatment for this condition will vary depending on how severe it is. Treatment may include:  Hydrocortisone creams or calamine lotions to relieve itching.  Oatmeal baths to soothe the skin.  Over-the-counter antihistamine tablets.  Oral steroid medicine for more severe outbreaks. Follow these instructions at home:  Take or apply over-the-counter and prescription medicines only as told by your health care provider.  Wash exposed skin as soon as possible with soap and cold water.  Use  hydrocortisone creams or calamine lotion as needed to soothe the skin and relieve itching.  Take oatmeal baths as needed. Use colloidal oatmeal. You can get this at your local pharmacy or grocery store. Follow the instructions on the packaging.  Do not scratch or rub your skin.  While you have the rash, wash clothes right after you wear them. How is this prevented?  Learn to identify the poison ivy plant and avoid contact with the plant. This plant can be recognized by the number of leaves. Generally, poison ivy has three leaves with flowering branches on a single stem. The leaves are typically glossy, and they have jagged edges that come to a point at the front.  If you have been exposed to poison ivy, thoroughly wash with soap and water right away. You have about 30 minutes to remove the plant resin before it will cause the rash. Be sure to wash under your fingernails because any plant resin there will continue to spread the rash.  When hiking or camping, wear clothes that will help you to avoid exposure on the skin. This includes long pants, a long-sleeved shirt, tall socks, and hiking boots. You can also apply preventive lotion to your skin to help limit exposure.  If you suspect that your clothes or outdoor gear came in contact with poison ivy, rinse them off outside with a garden hose before you bring them inside your house. Contact a health care provider if:  You have open sores in the rash area.  You have more redness, swelling, or pain in the affected area.  You have redness that spreads beyond  the rash area.  You have fluid, blood, or pus coming from the affected area.  You have a fever.  You have a rash over a large area of your body.  You have a rash on your eyes, mouth, or genitals.  Your rash does not improve after a few days. Get help right away if:  Your face swells or your eyes swell shut.  You have trouble breathing.  You have trouble swallowing. This  information is not intended to replace advice given to you by your health care provider. Make sure you discuss any questions you have with your health care provider. Document Released: 05/11/2000 Document Revised: 10/20/2015 Document Reviewed: 10/20/2014 Elsevier Interactive Patient Education  2017 [employer].

## 2016-10-17 NOTE — Progress Notes (Signed)
Subjective:     Patient ID: Toni Collins, female   DOB: 03-06-1966, 51 y.o.   MRN: [id number]  HPI Patient is here with pruritic skin rash involving all of her extremities. She states over 2 weeks ago she was working out in the yard and around some poison ivy. She broke out in blistery rash which is quite extensive - especially her forearms. Apparently another physician called in Medrol type dose pack and she took this for a week. Her symptoms were improving but that she had some breakthrough symptoms and was called in another Medrol pack. She completed this last Friday and now has recurrence of symptoms again. She has multiple excoriations on her extremities. Overall though improved compared to 2 weeks ago.  Past Medical History:  Diagnosis Date  . Colon polyps   . DEPRESSION 09/02/2008  . Fibromyalgia   . GRIEF REACTION 09/30/2009  . INSOMNIA, CHRONIC 09/02/2008  . PREDIABETES 03/10/2009   Past Surgical History:  Procedure Laterality Date  . ABDOMINAL HYSTERECTOMY  2002   TAH  . [place], 2000   x4  . TMJ ARTHROPLASTY     x2    reports that she has never smoked. She has never used smokeless tobacco. She reports that she does not drink alcohol. Her drug history is not on file. family history includes Arthritis in her mother; Diabetes in her maternal grandmother. No Known Allergies   Review of Systems  Constitutional: Negative for chills and fever.  Skin: Positive for rash.       Objective:   Physical Exam  Constitutional: She appears well-developed and well-nourished.  Cardiovascular: Normal rate and regular rhythm.   Skin: Rash noted.  Patient has multiple areas of excoriated rash on her upper extremities especially forearms. She's had multiple vesicles that are basically drying up. No cellulitis changes       Assessment:     Contact dermatitis with widespread involvement. She was prescribed initially suboptimal duration of prednisone  therapy and had breakthrough symptoms.    Plan:     -Consider Aveeno soap and avoid scratching as much as possible -Depo-Medrol 80 mg IM given. Touch base and 2-3 days if not further improved  Eulas Post MD [facility] [facility] at [facility]

## 2016-11-12 ENCOUNTER — Telehealth: Payer: Self-pay | Admitting: Family Medicine

## 2016-11-12 NOTE — Telephone Encounter (Signed)
Pt request refill  Oxycodone HCl 10 MG TABS  Pt states unable to reduce to 3 times a day.  And is out of her med today

## 2016-11-13 ENCOUNTER — Telehealth: Payer: Self-pay | Admitting: Family Medicine

## 2016-11-13 ENCOUNTER — Encounter: Payer: Self-pay | Admitting: *Deleted

## 2016-11-13 ENCOUNTER — Ambulatory Visit (INDEPENDENT_AMBULATORY_CARE_PROVIDER_SITE_OTHER): Payer: Self-pay | Admitting: Internal Medicine

## 2016-11-13 ENCOUNTER — Encounter: Payer: Self-pay | Admitting: Internal Medicine

## 2016-11-13 VITALS — BP 108/60 | HR 72 | Temp 97.7°F | Wt 155.8 lb

## 2016-11-13 DIAGNOSIS — L255 Unspecified contact dermatitis due to plants, except food: Secondary | ICD-10-CM

## 2016-11-13 MED ORDER — PREDNISONE 10 MG PO TABS: ORAL_TABLET | ORAL | 0 refills | Status: DC

## 2016-11-13 MED ORDER — OXYCODONE HCL 10 MG PO TABS: 10.0000 mg | ORAL_TABLET | Freq: Three times a day (TID) | ORAL | 0 refills | Status: DC | PRN

## 2016-11-13 MED ORDER — FLUOCINONIDE-E 0.05 % EX CREA: 1.0000 "application " | TOPICAL_CREAM | Freq: Two times a day (BID) | CUTANEOUS | 1 refills | Status: DC

## 2016-11-13 NOTE — Patient Instructions (Signed)
Use topical twice a day for now on eerging rash  Protection to   Avoid recurrance . If  affeting too many places such as  All extremities and face then can begin prednisone taper.    Poison Ivy Dermatitis Poison ivy dermatitis is inflammation of the skin that is caused by the allergens on the leaves of the poison ivy plant. The skin reaction often involves redness, swelling, blisters, and extreme itching. What are the causes? This condition is caused by a specific chemical (urushiol) found in the sap of the poison ivy plant. This chemical is sticky and can be easily spread to people, animals, and objects. You can get poison ivy dermatitis by:  Having direct contact with a poison ivy plant.  Touching animals, other people, or objects that have come in contact with poison ivy and have the chemical on them.  What increases the risk? This condition is more likely to develop in:  People who are outdoors often.  People who go outdoors without wearing protective clothing, such as closed shoes, long pants, and a long-sleeved shirt.  What are the signs or symptoms? Symptoms of this condition include:  Redness and itching.  A rash that often includes bumps and blisters. The rash usually appears 48 hours after exposure.  Swelling. This may occur if the reaction is more severe.  Symptoms usually last for 1-2 weeks. However, the first time you develop this condition, symptoms may last 3-4 weeks. How is this diagnosed? This condition may be diagnosed based on your symptoms and a physical exam. Your health care provider may also ask you about any recent outdoor activity. How is this treated? Treatment for this condition will vary depending on how severe it is. Treatment may include:  Hydrocortisone creams or calamine lotions to relieve itching.  Oatmeal baths to soothe the skin.  Over-the-counter antihistamine tablets.  Oral steroid medicine for more severe outbreaks.  Follow these  instructions at home:  Take or apply over-the-counter and prescription medicines only as told by your health care provider.  Wash exposed skin as soon as possible with soap and cold water.  Use hydrocortisone creams or calamine lotion as needed to soothe the skin and relieve itching.  Take oatmeal baths as needed. Use colloidal oatmeal. You can get this at your local pharmacy or grocery store. Follow the instructions on the packaging.  Do not scratch or rub your skin.  While you have the rash, wash clothes right after you wear them. How is this prevented?  Learn to identify the poison ivy plant and avoid contact with the plant. This plant can be recognized by the number of leaves. Generally, poison ivy has three leaves with flowering branches on a single stem. The leaves are typically glossy, and they have jagged edges that come to a point at the front.  If you have been exposed to poison ivy, thoroughly wash with soap and water right away. You have about 30 minutes to remove the plant resin before it will cause the rash. Be sure to wash under your fingernails because any plant resin there will continue to spread the rash.  When hiking or camping, wear clothes that will help you to avoid exposure on the skin. This includes long pants, a long-sleeved shirt, tall socks, and hiking boots. You can also apply preventive lotion to your skin to help limit exposure.  If you suspect that your clothes or outdoor gear came in contact with poison ivy, rinse them off outside with a  garden hose before you bring them inside your house. Contact a health care provider if:  You have open sores in the rash area.  You have more redness, swelling, or pain in the affected area.  You have redness that spreads beyond the rash area.  You have fluid, blood, or pus coming from the affected area.  You have a fever.  You have a rash over a large area of your body.  You have a rash on your eyes, mouth, or  genitals.  Your rash does not improve after a few days. Get help right away if:  Your face swells or your eyes swell shut.  You have trouble breathing.  You have trouble swallowing. This information is not intended to replace advice given to you by your health care provider. Make sure you discuss any questions you have with your health care provider. Document Released: 05/11/2000 Document Revised: 10/20/2015 Document Reviewed: 10/20/2014 Elsevier Interactive Patient Education  [employer].

## 2016-11-13 NOTE — Telephone Encounter (Signed)
May refill for #90 but I want her to continue to try to taper down.  If unable to make any progress with further tapering, I want to look at possible referral to pain management.

## 2016-11-13 NOTE — Progress Notes (Signed)
Chief Complaint  Patient presents with  . Poison Ivy    HPI: Toni Collins 51 y.o.  sda  Asked to work in today  pcp dr B  Had plant dermaitis last month that became severe  Had shot  Of    Depo end of may after failure of 1 week dose pack   And had sever case  Got better  And now has days of left wrist contact derm rash   Works outside [employer] and [employer].   No ssytemic sx .  Very itchy  ROS: See pertinent positives and negatives per HPI.  Past Medical History:  Diagnosis Date  . Colon polyps   . DEPRESSION 09/02/2008  . Fibromyalgia   . GRIEF REACTION 09/30/2009  . INSOMNIA, CHRONIC 09/02/2008  . PREDIABETES 03/10/2009    Family History  Problem Relation Age of Onset  . Arthritis Mother        rhematiod  . Depression Neg Hx        family  . Diabetes Maternal Grandmother     Social History   Social History  . Marital status: Married    Spouse name: N/A  . Number of children: N/A  . Years of education: N/A   Social History Main Topics  . Smoking status: Never Smoker  . Smokeless tobacco: Never Used  . Alcohol use No  . Drug use: Unknown  . Sexual activity: Not Asked   Other Topics Concern  . None   Social History Narrative  . None    Outpatient Medications Prior to Visit  Medication Sig Dispense Refill  . amphetamine-dextroamphetamine (ADDERALL) 20 MG tablet Take 20 mg by mouth 2 (two) times daily.  0  . ARIPiprazole (ABILIFY) 10 MG tablet   1  . Oxycodone HCl 10 MG TABS Take 4 times daily. (Patient taking differently: Take 3 times daily.) 90 tablet 0  . Oxycodone HCl 10 MG TABS Take 1 tablet (10 mg total) by mouth 3 (three) times daily as needed. 90 tablet 0  . PARoxetine (PAXIL) 40 MG tablet TAKE 1& 1/2 TABLET BY MOUTH EVERY DAY  5  . pregabalin (LYRICA) 150 MG capsule Take 150 mg by mouth 3 (three) times daily.      No facility-administered medications prior to visit.      EXAM:  BP 108/60 (BP Location: Right Arm, Patient Position: Sitting,  Cuff Size: Normal)   Pulse 72   Temp 97.7 F (36.5 C) (Oral)   Wt 155 lb 12.8 oz (70.7 kg)   BMI 28.50 kg/m   Body mass index is 28.5 kg/m.  GENERAL: vitals reviewed and listed above, alert, oriented, appears well hydrated and in no acute distress HEENT: atraumatic, conjunctiva  clear, no obvious abnormalities on inspection of external nose and ears  Skin  Left srist forearm volar suface with CD  About 3-4 cm area     MS: moves all extremities without noticeable focal  abnormality PSYCH: pleasant and cooperative, no obvious depression or anxiety  ASSESSMENT AND PLAN:  Discussed the following assessment and plan:  Plant dermatitis - new exposiure disc protection etc.  local can use topical add on prednisone  if needed as directed   -Patient advised to return or notify health care team  if symptoms worsen ,persist or new concerns arise.  Patient Instructions  Use topical twice a day for now on eerging rash  Protection to   Avoid recurrance . If  affeting too many places such as  All extremities and face then can begin prednisone taper.    Poison Ivy Dermatitis Poison ivy dermatitis is inflammation of the skin that is caused by the allergens on the leaves of the poison ivy plant. The skin reaction often involves redness, swelling, blisters, and extreme itching. What are the causes? This condition is caused by a specific chemical (urushiol) found in the sap of the poison ivy plant. This chemical is sticky and can be easily spread to people, animals, and objects. You can get poison ivy dermatitis by:  Having direct contact with a poison ivy plant.  Touching animals, other people, or objects that have come in contact with poison ivy and have the chemical on them.  What increases the risk? This condition is more likely to develop in:  People who are outdoors often.  People who go outdoors without wearing protective clothing, such as closed shoes, long pants, and a long-sleeved  shirt.  What are the signs or symptoms? Symptoms of this condition include:  Redness and itching.  A rash that often includes bumps and blisters. The rash usually appears 48 hours after exposure.  Swelling. This may occur if the reaction is more severe.  Symptoms usually last for 1-2 weeks. However, the first time you develop this condition, symptoms may last 3-4 weeks. How is this diagnosed? This condition may be diagnosed based on your symptoms and a physical exam. Your health care provider may also ask you about any recent outdoor activity. How is this treated? Treatment for this condition will vary depending on how severe it is. Treatment may include:  Hydrocortisone creams or calamine lotions to relieve itching.  Oatmeal baths to soothe the skin.  Over-the-counter antihistamine tablets.  Oral steroid medicine for more severe outbreaks.  Follow these instructions at home:  Take or apply over-the-counter and prescription medicines only as told by your health care provider.  Wash exposed skin as soon as possible with soap and cold water.  Use hydrocortisone creams or calamine lotion as needed to soothe the skin and relieve itching.  Take oatmeal baths as needed. Use colloidal oatmeal. You can get this at your local pharmacy or grocery store. Follow the instructions on the packaging.  Do not scratch or rub your skin.  While you have the rash, wash clothes right after you wear them. How is this prevented?  Learn to identify the poison ivy plant and avoid contact with the plant. This plant can be recognized by the number of leaves. Generally, poison ivy has three leaves with flowering branches on a single stem. The leaves are typically glossy, and they have jagged edges that come to a point at the front.  If you have been exposed to poison ivy, thoroughly wash with soap and water right away. You have about 30 minutes to remove the plant resin before it will cause the rash. Be  sure to wash under your fingernails because any plant resin there will continue to spread the rash.  When hiking or camping, wear clothes that will help you to avoid exposure on the skin. This includes long pants, a long-sleeved shirt, tall socks, and hiking boots. You can also apply preventive lotion to your skin to help limit exposure.  If you suspect that your clothes or outdoor gear came in contact with poison ivy, rinse them off outside with a garden hose before you bring them inside your house. Contact a health care provider if:  You have open sores in the rash area.  You  have more redness, swelling, or pain in the affected area.  You have redness that spreads beyond the rash area.  You have fluid, blood, or pus coming from the affected area.  You have a fever.  You have a rash over a large area of your body.  You have a rash on your eyes, mouth, or genitals.  Your rash does not improve after a few days. Get help right away if:  Your face swells or your eyes swell shut.  You have trouble breathing.  You have trouble swallowing. This information is not intended to replace advice given to you by your health care provider. Make sure you discuss any questions you have with your health care provider. Document Released: 05/11/2000 Document Revised: 10/20/2015 Document Reviewed: 10/20/2014 Elsevier Interactive Patient Education  2018 [place]. Trustin Chapa M.D.

## 2016-11-13 NOTE — Telephone Encounter (Signed)
Toni Collins the pharmacist states patient is taking 4 tabs a day not 3 tabs a day, so the refill will be early.  Patient has told the pharmacist that she is out of medication.  Okay to fill early?

## 2016-11-13 NOTE — Telephone Encounter (Signed)
Rx ready and patient is aware

## 2016-11-13 NOTE — Telephone Encounter (Signed)
Spoke with pharmacist and they will fill the prescription in the morning.  Also spoke with the patient and informed her that she should try an taper to 1 tab every eight hours or possibly look into pain management. Patient is aware.

## 2016-11-13 NOTE — Telephone Encounter (Signed)
Pt would like to see if you could call [facility] in [place] to get her Rx released early.

## 2016-11-13 NOTE — Telephone Encounter (Signed)
Can refill early this time only.  I want her to go ahead and reduce to one every 8 hours.  If she is not able to taper will look at pain management referral options.

## 2016-12-12 ENCOUNTER — Encounter: Payer: Self-pay | Admitting: Family Medicine

## 2016-12-12 ENCOUNTER — Ambulatory Visit (INDEPENDENT_AMBULATORY_CARE_PROVIDER_SITE_OTHER): Payer: Self-pay | Admitting: Family Medicine

## 2016-12-12 VITALS — BP 122/70 | HR 106 | Temp 98.1°F | Ht 62.0 in | Wt 151.9 lb

## 2016-12-12 DIAGNOSIS — G894 Chronic pain syndrome: Secondary | ICD-10-CM

## 2016-12-12 DIAGNOSIS — M797 Fibromyalgia: Secondary | ICD-10-CM

## 2016-12-12 MED ORDER — OXYCODONE HCL 10 MG PO TABS: 10.0000 mg | ORAL_TABLET | Freq: Three times a day (TID) | ORAL | 0 refills | Status: DC | PRN

## 2016-12-12 NOTE — Progress Notes (Signed)
Subjective:     Patient ID: Toni Collins, female   DOB: 1965-11-03, 51 y.o.   MRN: [id number]  HPI Patient here to discuss chronic pain management. Refer to previous note for details-from visit 09/19/16:   "Patient is here today to discuss chronic pain management. She has history of chronic pain syndrome and fibromyalgia. She's been on oxycodone for many years and came to me on that medication. She takes high-dose Lyrica per psychiatry and is also taking Paxil and Abilify. Her depression is relatively stable. She has ongoing fatigue issues. Husband on disability following stroke and this has caused some stress issues- especially financially.  She states her pain is "all over". She states she has pain from the time she steps down out of bed in the morning until she goes to bed and this has never been very well controlled on any medication. She had either intolerance or lack of improvement with previous medications including try cyclic antidepressants, gabapentin, and tramadol. She denies any constipation. She still states her pain is frequently 7-8 out of 10 even with medication.  No consistent exercise."  She has never been very well controlled with pain medication and we discussed issues of possible hyperalgesia related to chronic opioids. We tapered her down from 10 mg of oxycodone 4 times a day to 3 times a day. She unfortunately was unable to get Lyrica refilled recently. She currently has no insurance and is struggling with getting medications covered.  Past Medical History:  Diagnosis Date  . Colon polyps   . DEPRESSION 09/02/2008  . Fibromyalgia   . GRIEF REACTION 09/30/2009  . INSOMNIA, CHRONIC 09/02/2008  . PREDIABETES 03/10/2009   Past Surgical History:  Procedure Laterality Date  . ABDOMINAL HYSTERECTOMY  2002   TAH  . [place], 2000   x4  . TMJ ARTHROPLASTY     x2    reports that she has never smoked. She has never used smokeless tobacco.  She reports that she does not drink alcohol. Her drug history is not on file. family history includes Arthritis in her mother; Diabetes in her maternal grandmother. No Known Allergies    Review of Systems  Constitutional: Positive for fatigue.  Eyes: Negative for visual disturbance.  Respiratory: Negative for cough, chest tightness, shortness of breath and wheezing.   Cardiovascular: Negative for chest pain, palpitations and leg swelling.  Musculoskeletal: Positive for back pain and myalgias.  Neurological: Negative for dizziness, seizures, syncope, weakness, light-headedness and headaches.       Objective:   Physical Exam  Constitutional: She appears well-developed and well-nourished.  Eyes: Pupils are equal, round, and reactive to light.  Neck: Neck supple. No JVD present. No thyromegaly present.  Cardiovascular: Normal rate and regular rhythm.  Exam reveals no gallop.   Pulmonary/Chest: Effort normal and breath sounds normal. No respiratory distress. She has no wheezes. She has no rales.  Musculoskeletal: She exhibits no edema.  Neurological: She is alert.       Assessment:     Fibromyalgia and chronic pain syndrome    Plan:     -[employer] checked and she is not getting any opioids from any other practice. She does continue to get Adderall from her psychiatrist. -We elected not to further taper oxycodone until she get back on her Lyrica. -Our plan is in one month after she is back on Lyrica to reduce her oxycodone to 7.5 mg 3 times a day and then further taper  from there -Schedule follow-up in 3 months -she was inquiring about Ambien refill and we have discouraged sedative-hypnotics with her chronic opioid use.  Eulas Post MD [facility] at [facility]

## 2016-12-12 NOTE — Patient Instructions (Signed)
Let me know in one month if back on Lyrica and we can try further tapering of Oxycodone

## 2017-01-09 ENCOUNTER — Telehealth: Payer: Self-pay | Admitting: Family Medicine

## 2017-01-09 NOTE — Telephone Encounter (Signed)
Pt states she wants to begin a taper of the Oxycodone HCl 10 MG TABS  Would like to start Oxycodone 7.5 qid 4 a day this month only because she will be out of Lyrica for about a week due to the forms were delayed at the assistance program.  Next month pt states she would like to try Oxycodone 7.5 tid  Pt states she will be out tomorrow.

## 2017-01-10 NOTE — Telephone Encounter (Signed)
We can refill Oxycodone 7.5 mg po qid #120 and will plan to taper down to TID by next month.

## 2017-01-10 NOTE — Telephone Encounter (Signed)
Pt would like to pick up Rx in the am because she is out of med today.  Advised pt to always give 3 day notice

## 2017-01-11 MED ORDER — OXYCODONE HCL 7.5 MG PO TABS: ORAL_TABLET | ORAL | 0 refills | Status: DC

## 2017-01-11 NOTE — Telephone Encounter (Signed)
Rx ready for pick up and patient is aware 

## 2017-02-05 ENCOUNTER — Telehealth: Payer: Self-pay | Admitting: Family Medicine

## 2017-02-05 MED ORDER — OXYCODONE HCL 7.5 MG PO TABS
ORAL_TABLET | ORAL | 0 refills | Status: DC
Start: 2017-02-05 — End: 2017-02-08
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
## 2017-02-05 NOTE — Telephone Encounter (Signed)
Last refill 01/11/17 and last office visit 11/1816.  Okay to fill?

## 2017-02-05 NOTE — Telephone Encounter (Signed)
Rx ready for pick up and patient is aware 

## 2017-02-05 NOTE — Telephone Encounter (Signed)
OK 

## 2017-02-08 ENCOUNTER — Other Ambulatory Visit: Payer: Self-pay | Admitting: *Deleted

## 2017-02-08 MED ORDER — OXYCODONE HCL 5 MG PO TABS: ORAL_TABLET | ORAL | 0 refills | Status: DC

## 2017-02-08 NOTE — Telephone Encounter (Signed)
Oxycodone Rx changed to 5mg .  Okay per Dr Elease Hashimoto.

## 2017-02-14 ENCOUNTER — Encounter: Payer: Self-pay | Admitting: Family Medicine

## 2017-03-11 ENCOUNTER — Encounter: Payer: Self-pay | Admitting: Family Medicine

## 2017-03-11 ENCOUNTER — Ambulatory Visit (INDEPENDENT_AMBULATORY_CARE_PROVIDER_SITE_OTHER): Payer: Self-pay | Admitting: Family Medicine

## 2017-03-11 ENCOUNTER — Other Ambulatory Visit: Payer: Self-pay | Admitting: *Deleted

## 2017-03-11 VITALS — BP 110/80 | HR 72 | Temp 98.3°F | Wt 160.0 lb

## 2017-03-11 DIAGNOSIS — Z79899 Other long term (current) drug therapy: Secondary | ICD-10-CM

## 2017-03-11 MED ORDER — OXYCODONE HCL 5 MG PO TABS: ORAL_TABLET | ORAL | 0 refills | Status: DC

## 2017-03-11 NOTE — Progress Notes (Signed)
Subjective:     Patient ID: Toni Collins, female   DOB: [birth year], 51 y.o.   MRN: [id number]  HPI Patient is here for chronic pain management follow-up. She has history of fibromyalgia and chronic pain syndrome and has been on opioids she states since she was age 72. She is also followed by psychiatry and maintain on several medications including Lyrica, Abilify, Adderall.  We have been able to taper her oxycodone from 15 mg down current dose of 7.5 mg 4 times daily. She recently started job with [employer] which requires lifting 4 hours per day and she states this has increased her back pain tremendously. She still has frequently pain that is 6-7 out of 10 with medication. Denies any constipation or other side effects from medication.  Indication for chronic opioid: Fibromyalgia and chronic back pain not relieved with several medications including TCAs, NSAIDS, muscle relaxers,Tylenol. Medication and dose: Oxyir 5 mg one and one half tablets QID # pills per month: 180 Last UDS date: 03-11-17 Pain contract signed (Y/N): Y-03/11/17 updated Date narcotic database last reviewed (include red flags): 03-11-17  Today's Vitals   03/11/17 1153  BP: 110/80  Pulse: 72  Temp: 98.3 F (36.8 C)  TempSrc: Oral  SpO2: 97%  Weight: 160 lb (72.6 kg)     Past Medical History:  Diagnosis Date  . Colon polyps   . DEPRESSION 09/02/2008  . Fibromyalgia   . GRIEF REACTION 09/30/2009  . INSOMNIA, CHRONIC 09/02/2008  . PREDIABETES 03/10/2009   Past Surgical History:  Procedure Laterality Date  . ABDOMINAL HYSTERECTOMY  2002   TAH  . [place], 2000   x4  . TMJ ARTHROPLASTY     x2    reports that she has never smoked. She has never used smokeless tobacco. She reports that she does not drink alcohol. Her drug history is not on file. family history includes Arthritis in her mother; Diabetes in her maternal grandmother. No Known Allergies   Review of Systems   Constitutional: Positive for fatigue. Negative for appetite change, chills and fever.  Cardiovascular: Negative for chest pain.  Gastrointestinal: Negative for abdominal pain.  Musculoskeletal: Positive for back pain and myalgias.  Neurological: Negative for dizziness.  Hematological: Negative for adenopathy.       Objective:   Physical Exam  Constitutional: She appears well-developed and well-nourished.  Neck: Neck supple.  Cardiovascular: Normal rate and regular rhythm.   Pulmonary/Chest: Effort normal and breath sounds normal. No respiratory distress. She has no wheezes. She has no rales.  Musculoskeletal: She exhibits no edema.  Psychiatric: She has a normal mood and affect. Her behavior is normal.       Assessment:     Chronic pain syndrome- chronic back pain and fibromyalgia    Plan:     -new drug contract signed. -urine drug screen obtained -[place] Controlled Substance Registry looked at.  She is only getting opioids from this clinic. -refilled her Oxycodone for 3 months.  We will plan to discuss further tapering at follow up. -follow up in 3 months.  Eulas Post MD [facility] at [facility]

## 2017-03-15 LAB — PAIN MGMT, PROFILE 8 W/CONF, U
6 Acetylmorphine: NEGATIVE ng/mL (ref <10)
ALCOHOL METABOLITES: POSITIVE ng/mL — AB (ref <500)
Amphetamines: NEGATIVE ng/mL (ref <500)
BENZODIAZEPINES: NEGATIVE ng/mL (ref <100)
Buprenorphine, Urine: NEGATIVE ng/mL (ref <5)
COCAINE METABOLITE: NEGATIVE ng/mL (ref <150)
Creatinine: 120.9 mg/dL
ETHYL GLUCURONIDE (ETG): 12744 ng/mL — AB (ref <500)
ETHYL SULFATE (ETS): 2846 ng/mL — AB (ref <100)
MARIJUANA METABOLITE: NEGATIVE ng/mL (ref <20)
MDMA: NEGATIVE ng/mL (ref <500)
NOROXYCODONE: 546 ng/mL — AB (ref <50)
OXYCODONE: NEGATIVE ng/mL (ref <50)
OXYCODONE: POSITIVE ng/mL — AB (ref <100)
Opiates: NEGATIVE ng/mL (ref <100)
Oxidant: NEGATIVE ug/mL (ref <200)
Oxymorphone: 115 ng/mL — ABNORMAL HIGH (ref <50)
pH: 7.44 (ref 4.5–9.0)

## 2017-06-07 ENCOUNTER — Ambulatory Visit (INDEPENDENT_AMBULATORY_CARE_PROVIDER_SITE_OTHER): Payer: Self-pay | Admitting: Family Medicine

## 2017-06-07 ENCOUNTER — Encounter: Payer: Self-pay | Admitting: Family Medicine

## 2017-06-07 VITALS — BP 110/80 | HR 76 | Temp 98.2°F | Wt 155.6 lb

## 2017-06-07 DIAGNOSIS — M797 Fibromyalgia: Secondary | ICD-10-CM

## 2017-06-07 DIAGNOSIS — G894 Chronic pain syndrome: Secondary | ICD-10-CM

## 2017-06-07 MED ORDER — OXYCODONE HCL 5 MG PO TABS: ORAL_TABLET | ORAL | 0 refills | Status: DC

## 2017-06-07 NOTE — Progress Notes (Signed)
Subjective:     Patient ID: Toni Collins, female   DOB: January 16, 1966, 52 y.o.   MRN: [id number]  HPI   Patient here for chronic pain management follow-up. She is followed by psychiatry for history of some chronic anxiety and depression as well as attention deficit disorder. Medications reviewed. She remains on immediate release oxycodone 5 mg one and one half tablets 4 times daily.  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 She had recent shoulder injury and is followed by [employer] [facility] for that. She denies any alcohol use.  Indication for chronic opioid: chronic back pain and fibromyalgia Medication and dose: Oxycodone 5 mg one and one half tablet qid # pills per month: 180 Last UDS date: 3 months ago Pain contract signed (Y/N): Y Date narcotic database last reviewed (include red flags): 06/07/17  No concerns.  Past Medical History:  Diagnosis Date  . Colon polyps   . DEPRESSION 09/02/2008  . Fibromyalgia   . GRIEF REACTION 09/30/2009  . INSOMNIA, CHRONIC 09/02/2008  . PREDIABETES 03/10/2009   Past Surgical History:  Procedure Laterality Date  . ABDOMINAL HYSTERECTOMY  2002   TAH  . [place], 2000   x4  . TMJ ARTHROPLASTY     x2    reports that  has never smoked. she has never used smokeless tobacco. She reports that she does not drink alcohol. Her drug history is not on file. family history includes Arthritis in her mother; Diabetes in her maternal grandmother. No Known Allergies   Review of Systems  Constitutional: Negative for appetite change and unexpected weight change.  Respiratory: Negative for cough.   Cardiovascular: Negative for chest pain.  Gastrointestinal: Negative for abdominal pain.  Musculoskeletal: Positive for back pain and myalgias.       Objective:   Physical Exam   Constitutional: She appears well-developed and well-nourished.  Cardiovascular: Normal rate and regular rhythm.  Pulmonary/Chest: Effort normal and breath sounds normal. No respiratory distress. She has no wheezes. She has no rales.  Musculoskeletal:  Pain with abduction left shoulder against resistance       Assessment:     #1 Chronic pain syndrome-chronic back pain and fibromyalgia clinically stable  #2 Fibromyalgia.      Plan:     -Refill oxycodone for 3 months -we have recommended again trying to engage in regular exercise-especially for fibromyalgia pain.  Eulas Post MD [facility] at [facility]

## 2017-06-07 NOTE — Patient Instructions (Signed)

## 2017-09-03 ENCOUNTER — Telehealth: Payer: Self-pay | Admitting: Family Medicine

## 2017-09-03 ENCOUNTER — Encounter: Payer: Self-pay | Admitting: Internal Medicine

## 2017-09-03 NOTE — Telephone Encounter (Signed)
Copied from [facility] [id number]. Topic: Quick Communication - Rx Refill/Question >> Sep 03, 2017  2:51 PM Scherrie Gerlach wrote: Medication: oxyCODONE (OXY IR/ROXICODONE) 5 MG immediate release tablet  Pt will be out of her med tomorrow and aware Dr Elease Hashimoto out. Pt would [facility]  [id number] [place], [place] [phone] (Phone) [phone] (Fax)  Pt has scheduled her pain management appt for 09/10/2017

## 2017-09-04 NOTE — Telephone Encounter (Signed)
Please advise Dr Regis Bill, thanks.   Oxycodone last filled 06/07/17, #180 x 3 prescriptions.

## 2017-09-04 NOTE — Telephone Encounter (Signed)
Patient calling to check status about refill. Please advise

## 2017-09-05 MED ORDER — OXYCODONE HCL 5 MG PO TABS: ORAL_TABLET | ORAL | 0 refills | Status: DC

## 2017-09-05 NOTE — Telephone Encounter (Signed)
I will send in  Enough to get to your appt on Tuesday (36) With dr Elease Hashimoto and further  Refills from him .   Should make appt every 3 months with him for pain management  So she doesn't run out in future .

## 2017-09-06 NOTE — Telephone Encounter (Signed)
Dr Regis Bill sent in a refill.  Appointment made.

## 2017-09-10 ENCOUNTER — Ambulatory Visit (INDEPENDENT_AMBULATORY_CARE_PROVIDER_SITE_OTHER): Payer: Self-pay | Admitting: Family Medicine

## 2017-09-10 ENCOUNTER — Encounter: Payer: Self-pay | Admitting: Family Medicine

## 2017-09-10 VITALS — BP 102/70 | HR 76 | Temp 97.9°F | Wt 152.3 lb

## 2017-09-10 DIAGNOSIS — G894 Chronic pain syndrome: Secondary | ICD-10-CM

## 2017-09-10 MED ORDER — OXYCODONE HCL 5 MG PO TABS: ORAL_TABLET | ORAL | 0 refills | Status: DC

## 2017-09-10 NOTE — Patient Instructions (Signed)
consider setting up physical this year.

## 2017-09-10 NOTE — Progress Notes (Signed)
Subjective:     Patient ID: Toni Collins, female   DOB: 15-Jul-1965, 52 y.o.   MRN: [id number]  HPI Patient here for follow-up regarding chronic pain management. She has been for several years on Oxicodone.  She continues to be followed by psychiatry regarding chronic anxiety and depression and attention deficit disorder. She denies any side effects from medication. She still has poor pain control at times. Works with [employer] and job does require some lifting and physical labor. Had recent MRI of her shoulder following work-related injury. Fortunately, this did not show any significant rotator cuff tear  Indication for chronic opioid: Chronic back pain and fibromyalgia Medication and dose: Oxycodone 5 mg one and one half tablets 4 times daily  # pills per month: 180 Last UDS date: 03-11-17 Opioid Treatment Agreement signed (Y/N): Updated on today's visit Opioid Treatment Agreement last reviewed with patient:  09/10/17 NCCSRS reviewed this encounter (include red flags):  reviewed today with no red flags  Past Medical History:  Diagnosis Date  . Colon polyps   . DEPRESSION 09/02/2008  . Fibromyalgia   . GRIEF REACTION 09/30/2009  . INSOMNIA, CHRONIC 09/02/2008  . PREDIABETES 03/10/2009   Past Surgical History:  Procedure Laterality Date  . ABDOMINAL HYSTERECTOMY  2002   TAH  . [place], 2000   x4  . TMJ ARTHROPLASTY     x2    reports that she has never smoked. She has never used smokeless tobacco. She reports that she does not drink alcohol. Her drug history is not on file. family history includes Arthritis in her mother; Diabetes in her maternal grandmother. No Known Allergies    Review of Systems  Constitutional: Negative for appetite change and unexpected weight change.  Respiratory: Negative for shortness of breath.   Cardiovascular: Negative for chest pain.  Gastrointestinal: Negative for constipation.  Musculoskeletal: Positive for back pain  and myalgias.       Objective:   Physical Exam  Constitutional: She appears well-developed and well-nourished.  Cardiovascular: Normal rate and regular rhythm.  Pulmonary/Chest: Effort normal and breath sounds normal. No respiratory distress. She has no wheezes. She has no rales.  Musculoskeletal: She exhibits no edema.       Assessment:     Chronic pain syndrome stable    Plan:     -Refilled pain medication for 3 months -We have suggested complete physical some point this year. She is waiting on full insurance coverage  Eulas Post MD [facility] at [facility]

## 2017-11-26 ENCOUNTER — Encounter: Payer: Self-pay | Admitting: Family Medicine

## 2017-11-26 ENCOUNTER — Ambulatory Visit (INDEPENDENT_AMBULATORY_CARE_PROVIDER_SITE_OTHER): Payer: Self-pay | Admitting: Family Medicine

## 2017-11-26 VITALS — BP 122/82 | HR 69 | Temp 98.6°F | Wt 159.8 lb

## 2017-11-26 DIAGNOSIS — M19041 Primary osteoarthritis, right hand: Secondary | ICD-10-CM

## 2017-11-26 DIAGNOSIS — M19042 Primary osteoarthritis, left hand: Secondary | ICD-10-CM

## 2017-11-26 NOTE — Progress Notes (Signed)
  Subjective:     Patient ID: Toni Collins, female   DOB: Oct 11, 1965, 52 y.o.   MRN: [id number]  HPI Patient seen to discuss bilateral hand pain. Apparently her husband was recently diagnosed with rheumatoid arthritis. Patient states her mother has rheumatoid arthritis. Her pain is mostly involving the PIP and DIP joints. She's also recently noticed some nodules in both hands. She has had relative sparing of MCP joints. Denies any other joint involvement. She has long-standing history of fibromyalgia.  No recent skin rash. She's complaining of some dry mouth but apparently was recently started per psychiatrist on clonidine 0.1 mg 2 tablets 3 times a day  Past Medical History:  Diagnosis Date  . Colon polyps   . DEPRESSION 09/02/2008  . Fibromyalgia   . GRIEF REACTION 09/30/2009  . INSOMNIA, CHRONIC 09/02/2008  . PREDIABETES 03/10/2009   Past Surgical History:  Procedure Laterality Date  . ABDOMINAL HYSTERECTOMY  2002   TAH  . [place], 2000   x4  . TMJ ARTHROPLASTY     x2    reports that she has never smoked. She has never used smokeless tobacco. She reports that she does not drink alcohol. Her drug history is not on file. family history includes Arthritis in her mother; Diabetes in her maternal grandmother. No Known Allergies   Review of Systems  Constitutional: Negative for appetite change, chills and fever.  Respiratory: Negative for cough and shortness of breath.   Cardiovascular: Negative for chest pain.  Musculoskeletal: Positive for arthralgias.       Objective:   Physical Exam  Constitutional: She appears well-developed and well-nourished.  Cardiovascular: Normal rate and regular rhythm.  Pulmonary/Chest: Effort normal and breath sounds normal.  Musculoskeletal:  Patient has some thickening and nodular changes involving several joints of the hand confined to the PIP and DIP joints. No warmth. No erythema.       Assessment:      Primary osteoarthritis of the hands. She does not have distribution or findings to suggest likely rheumatoid arthritis.    Plan:     -Reassurance. We discussed potential testing for inflammatory arthritis but clinical suspicion is low. We agreed to wait and observe at this time.   Eulas Post MD [facility] at [facility]

## 2017-11-26 NOTE — Patient Instructions (Signed)

## 2017-12-04 MED ORDER — OXYCODONE HCL 5 MG PO TABS: ORAL_TABLET | ORAL | 0 refills | Status: DC

## 2017-12-04 NOTE — Addendum Note (Signed)
Addended by: Eulas Post on: 12/04/2017 07:24 PM   Modules accepted: Orders

## 2017-12-06 ENCOUNTER — Ambulatory Visit: Payer: Self-pay | Admitting: Family Medicine

## 2017-12-26 ENCOUNTER — Encounter: Payer: Self-pay | Admitting: Family Medicine

## 2018-01-02 ENCOUNTER — Encounter: Payer: Self-pay | Admitting: Family Medicine

## 2018-01-03 ENCOUNTER — Encounter: Payer: Self-pay | Admitting: Family Medicine

## 2018-01-06 ENCOUNTER — Ambulatory Visit (INDEPENDENT_AMBULATORY_CARE_PROVIDER_SITE_OTHER): Payer: Self-pay | Admitting: Family Medicine

## 2018-01-06 VITALS — BP 100/70 | HR 69 | Temp 98.4°F | Wt 155.2 lb

## 2018-01-06 DIAGNOSIS — G894 Chronic pain syndrome: Secondary | ICD-10-CM

## 2018-01-06 DIAGNOSIS — M797 Fibromyalgia: Secondary | ICD-10-CM

## 2018-01-06 MED ORDER — OXYCODONE HCL 10 MG PO TABS: ORAL_TABLET | ORAL | 0 refills | Status: DC

## 2018-01-06 MED ORDER — OXYCODONE HCL 10 MG PO TABS: 10.0000 mg | ORAL_TABLET | Freq: Four times a day (QID) | ORAL | 0 refills | Status: DC

## 2018-01-06 NOTE — Progress Notes (Signed)
  Subjective:     Patient ID: Toni Collins, female   DOB: [birth year], 52 y.o.   MRN: [id number]  HPI Patient seen to discuss chronic pain medications. She's been on opioid medication for many years. She has history of fibromyalgia and chronic back pain. She states that on her current dosage of oxycodone 7.5 mg 4 times daily she has frequent breakthrough pain. She states that when she was taking 10 mg 4 times a day she felt was controlled her pain much better. She sometimes rates her pain 8-9 out of 10 with breakthrough at her current dosage.  She has past history of PTSD and realizes that anxiety may be contributing to significant weight her chronic pain issues. She is followed by psychiatry for depression and ADD. She is maintained on Adderall and also takes Lyrica but is in process of scaling back to 75 mg 3 times a day.  Past Medical History:  Diagnosis Date  . Colon polyps   . DEPRESSION 09/02/2008  . Fibromyalgia   . GRIEF REACTION 09/30/2009  . INSOMNIA, CHRONIC 09/02/2008  . PREDIABETES 03/10/2009   Past Surgical History:  Procedure Laterality Date  . ABDOMINAL HYSTERECTOMY  2002   TAH  . [place], 2000   x4  . TMJ ARTHROPLASTY     x2    reports that she has never smoked. She has never used smokeless tobacco. She reports that she does not drink alcohol. Her drug history is not on file. family history includes Arthritis in her mother; Diabetes in her maternal grandmother. No Known Allergies   Review of Systems  Constitutional: Negative for appetite change, fatigue and unexpected weight change.  Eyes: Negative for visual disturbance.  Respiratory: Negative for cough, chest tightness, shortness of breath and wheezing.   Cardiovascular: Negative for chest pain, palpitations and leg swelling.  Musculoskeletal: Positive for back pain and myalgias.  Neurological: Negative for dizziness, seizures, syncope, weakness, light-headedness and headaches.        Objective:   Physical Exam  Constitutional: She appears well-developed and well-nourished.  Cardiovascular: Normal rate.  Psychiatric: She has a normal mood and affect. Her behavior is normal.       Assessment:     Chronic pain syndrome poorly controlled with current dose of oxycodone 7.5 mg 4 times a day    Plan:     -We discussed possible referral to pain management clinic. She currently has no insurance coverage and is not interested in that option at this time. -We agreed to increase her oxycodone to 10 mg 4 times a day    new prescription for this month and then may refill in one month. We will keep her on her every 3 months cycle for follow-up for chronic pain management.  Eulas Post MD [facility] at [facility]

## 2018-01-06 NOTE — Telephone Encounter (Signed)
I put my incorrect number on previous msg  [phone].I know he doesn't have the time to call But, just in case he had a moment   Here is the correct phone number to call, if you are able and have time.  She put the wrong number in the previous message.

## 2018-01-07 ENCOUNTER — Ambulatory Visit: Payer: Self-pay | Admitting: Family Medicine

## 2018-03-06 ENCOUNTER — Encounter: Payer: Self-pay | Admitting: Family Medicine

## 2018-03-06 ENCOUNTER — Other Ambulatory Visit: Payer: Self-pay | Admitting: Family Medicine

## 2018-03-07 MED ORDER — OXYCODONE HCL 10 MG PO TABS: 10.0000 mg | ORAL_TABLET | Freq: Four times a day (QID) | ORAL | 0 refills | Status: DC

## 2018-03-07 NOTE — Telephone Encounter (Signed)
Refilled for one month.  Make sure pt has office follow up in one month.

## 2018-03-07 NOTE — Telephone Encounter (Signed)
Last OV 01/06/18, No future OV  Last filled 01/06/18, #120 with 1 refill  Please see mychart message.

## 2018-03-07 NOTE — Telephone Encounter (Signed)
Already addressed in separate note.

## 2018-03-20 ENCOUNTER — Other Ambulatory Visit: Payer: Self-pay

## 2018-03-20 ENCOUNTER — Other Ambulatory Visit: Payer: Self-pay | Admitting: Psychiatry

## 2018-03-20 DIAGNOSIS — F902 Attention-deficit hyperactivity disorder, combined type: Secondary | ICD-10-CM

## 2018-03-20 MED ORDER — AMPHETAMINE-DEXTROAMPHETAMINE 30 MG PO TABS: 30.0000 mg | ORAL_TABLET | Freq: Two times a day (BID) | ORAL | 0 refills | Status: DC

## 2018-03-20 NOTE — Progress Notes (Signed)
Adderall reorder

## 2018-03-20 NOTE — Telephone Encounter (Signed)
Verifying pharmacy for refills

## 2018-03-28 ENCOUNTER — Encounter: Payer: Self-pay | Admitting: Emergency Medicine

## 2018-03-28 DIAGNOSIS — F4310 Post-traumatic stress disorder, unspecified: Secondary | ICD-10-CM | POA: Insufficient documentation

## 2018-03-28 DIAGNOSIS — F909 Attention-deficit hyperactivity disorder, unspecified type: Secondary | ICD-10-CM | POA: Insufficient documentation

## 2018-03-28 DIAGNOSIS — F411 Generalized anxiety disorder: Secondary | ICD-10-CM | POA: Insufficient documentation

## 2018-04-04 ENCOUNTER — Other Ambulatory Visit: Payer: Self-pay

## 2018-04-04 ENCOUNTER — Ambulatory Visit (INDEPENDENT_AMBULATORY_CARE_PROVIDER_SITE_OTHER): Payer: Self-pay | Admitting: Family Medicine

## 2018-04-04 ENCOUNTER — Encounter: Payer: Self-pay | Admitting: Family Medicine

## 2018-04-04 VITALS — BP 130/88 | HR 97 | Temp 98.3°F | Wt 157.2 lb

## 2018-04-04 DIAGNOSIS — G894 Chronic pain syndrome: Secondary | ICD-10-CM

## 2018-04-04 MED ORDER — OXYCODONE HCL 10 MG PO TABS: ORAL_TABLET | ORAL | 0 refills | Status: DC

## 2018-04-04 MED ORDER — OXYCODONE HCL 10 MG PO TABS: 10.0000 mg | ORAL_TABLET | Freq: Four times a day (QID) | ORAL | 0 refills | Status: DC

## 2018-04-04 NOTE — Progress Notes (Signed)
  Subjective:     Patient ID: Toni Collins, female   DOB: [birth year], 52 y.o.   MRN: [id number]  HPI Patient is here for chronic pain management follow-up.  She has history of chronic back pain and chronic fibromyalgia.  She has been for several years on opioids.  We have tried tapering in the past without much success.  She has history of recurrent depression treated by psychiatry and also attention deficit disorder which they treated as well.  She has been on regimen of oxycodone 10 mg every 6 hours.  She is on Lyrica per psychiatry.  She denies any side effects from medication at this time.  She is having some recurring left shoulder pain.  She had Workmen's Comp. injury and saw orthopedist for that.  Apparently had MRI which showed some "partial tears".  She had a couple of injections most recently in August.  Has been doing some yard work recently.  Is not sure if that flared up her current pain.  Indication for chronic opioid: fibromyalgia, chronic back pain. Medication and dose: Oxycodone 10 mg every 6 hours # pills per month: 120 Last UDS date: 10/18 Opioid Treatment Agreement signed (Y/N): y Opioid Treatment Agreement last reviewed with patient:yes   NCCSRS reviewed this encounter (include red flags): Reviewed today with no red flags     Past Medical History:  Diagnosis Date  . Colon polyps   . DEPRESSION 09/02/2008  . Fibromyalgia   . GRIEF REACTION 09/30/2009  . INSOMNIA, CHRONIC 09/02/2008  . PREDIABETES 03/10/2009   Past Surgical History:  Procedure Laterality Date  . ABDOMINAL HYSTERECTOMY  2002   TAH  . [place], 2000   x4  . TMJ ARTHROPLASTY     x2    reports that she has never smoked. She has never used smokeless tobacco. She reports that she does not drink alcohol. Her drug history is not on file. family history includes Arthritis in her mother; Diabetes in her maternal grandmother. No Known Allergies    Review of Systems   Musculoskeletal: Positive for back pain and myalgias.       Objective:   Physical Exam  Constitutional: She appears well-developed and well-nourished.  Cardiovascular: Normal rate and regular rhythm.  Pulmonary/Chest: Effort normal and breath sounds normal. She has no wheezes. She has no rales.  Musculoskeletal: She exhibits no edema.       Assessment:     Chronic pain syndrome-overall stable    Plan:     -Urine drug screen ordered for today -Controlled substance registry reviewed.  No red flags. -Refilled medication for 3 months -62-month follow-up  Eulas Post MD [facility] at [facility]

## 2018-04-07 LAB — PAIN MGMT, PROFILE 8 W/CONF, U
6 ACETYLMORPHINE: NEGATIVE ng/mL (ref <10)
ALPHAHYDROXYMIDAZOLAM: NEGATIVE ng/mL (ref <50)
AMINOCLONAZEPAM: NEGATIVE ng/mL (ref <25)
AMPHETAMINE: 7251 ng/mL — AB (ref <250)
AMPHETAMINES: POSITIVE ng/mL — AB (ref <500)
Alcohol Metabolites: NEGATIVE ng/mL (ref <500)
Alphahydroxyalprazolam: NEGATIVE ng/mL (ref <25)
Alphahydroxytriazolam: NEGATIVE ng/mL (ref <50)
Benzodiazepines: POSITIVE ng/mL — AB (ref <100)
Buprenorphine, Urine: NEGATIVE ng/mL (ref <5)
CREATININE: 127.7 mg/dL
Cocaine Metabolite: NEGATIVE ng/mL (ref <150)
Codeine: NEGATIVE ng/mL (ref <50)
HYDROMORPHONE: NEGATIVE ng/mL (ref <50)
Hydrocodone: NEGATIVE ng/mL (ref <50)
Hydroxyethylflurazepam: NEGATIVE ng/mL (ref <50)
Lorazepam: NEGATIVE ng/mL (ref <50)
MDMA: NEGATIVE ng/mL (ref <500)
METHAMPHETAMINE: NEGATIVE ng/mL (ref <250)
MORPHINE: NEGATIVE ng/mL (ref <50)
Marijuana Metabolite: NEGATIVE ng/mL (ref <20)
NORDIAZEPAM: NEGATIVE ng/mL (ref <50)
Norhydrocodone: NEGATIVE ng/mL (ref <50)
Noroxycodone: >20000 ng/mL — ABNORMAL HIGH (ref <50)
OPIATES: NEGATIVE ng/mL (ref <100)
OXIDANT: NEGATIVE ug/mL (ref <200)
OXYCODONE: 8753 ng/mL — AB (ref <50)
Oxazepam: 175 ng/mL — ABNORMAL HIGH (ref <50)
Oxycodone: POSITIVE ng/mL — AB (ref <100)
Oxymorphone: 1534 ng/mL — ABNORMAL HIGH (ref <50)
PH: 6.17 (ref 4.5–9.0)
TEMAZEPAM: 59 ng/mL — AB (ref <50)

## 2018-04-14 ENCOUNTER — Ambulatory Visit: Payer: Self-pay | Admitting: Psychiatry

## 2018-04-17 ENCOUNTER — Other Ambulatory Visit: Payer: Self-pay | Admitting: Psychiatry

## 2018-04-17 ENCOUNTER — Telehealth: Payer: Self-pay | Admitting: Psychiatry

## 2018-04-17 DIAGNOSIS — F902 Attention-deficit hyperactivity disorder, combined type: Secondary | ICD-10-CM

## 2018-04-17 MED ORDER — AMPHETAMINE-DEXTROAMPHETAMINE 30 MG PO TABS: 30.0000 mg | ORAL_TABLET | Freq: Two times a day (BID) | ORAL | 0 refills | Status: DC

## 2018-04-17 NOTE — Telephone Encounter (Signed)
Please send

## 2018-04-17 NOTE — Telephone Encounter (Signed)
Need Adderall refill today mother in-law dying.

## 2018-04-17 NOTE — Progress Notes (Signed)
adderall refill

## 2018-05-16 ENCOUNTER — Other Ambulatory Visit: Payer: Self-pay | Admitting: Psychiatry

## 2018-05-16 ENCOUNTER — Other Ambulatory Visit: Payer: Self-pay

## 2018-05-16 ENCOUNTER — Telehealth: Payer: Self-pay | Admitting: Psychiatry

## 2018-05-16 DIAGNOSIS — F902 Attention-deficit hyperactivity disorder, combined type: Secondary | ICD-10-CM

## 2018-05-16 MED ORDER — AMPHETAMINE-DEXTROAMPHETAMINE 30 MG PO TABS: 30.0000 mg | ORAL_TABLET | Freq: Two times a day (BID) | ORAL | 0 refills | Status: DC

## 2018-05-16 NOTE — Telephone Encounter (Signed)
Please  E-scribe.

## 2018-05-16 NOTE — Telephone Encounter (Signed)
Patient stated has been out of town due to death in the family. Requesting refill on [facility] [place]. Chart in box.

## 2018-05-16 NOTE — Progress Notes (Signed)
Pt needs to keep next scheduled appt in Feb or will stop refills. Hx frequent no shows. Adderall refill

## 2018-06-16 ENCOUNTER — Other Ambulatory Visit: Payer: Self-pay | Admitting: Psychiatry

## 2018-06-16 ENCOUNTER — Telehealth: Payer: Self-pay | Admitting: Psychiatry

## 2018-06-16 DIAGNOSIS — F902 Attention-deficit hyperactivity disorder, combined type: Secondary | ICD-10-CM

## 2018-06-16 MED ORDER — AMPHETAMINE-DEXTROAMPHETAMINE 30 MG PO TABS: 30.0000 mg | ORAL_TABLET | Freq: Two times a day (BID) | ORAL | 0 refills | Status: DC

## 2018-06-16 NOTE — Progress Notes (Signed)
Adderall refill.  Appt in Feb.  Needs to keep it.

## 2018-06-16 NOTE — Telephone Encounter (Signed)
Michelle's husband called to say that [facility] could not [facility] for [facility] because they are having computer issues and can't get Dr. Casimiro Needle info loaded and therefore can't fill the prescription you sent.  Please send it to [facility] in [place] so they can get it filled.

## 2018-06-16 NOTE — Telephone Encounter (Signed)
Canceled rx at [place]

## 2018-06-16 NOTE — Telephone Encounter (Signed)
Sent to [facility].  [facility] and cancel the order for adderall.  Pt has pattern of NS and is scheduled for Feb.

## 2018-06-16 NOTE — Telephone Encounter (Signed)
Patient called and said that she needs a refill on her adderall 30 mg [facility] [address]. Her next appt is on 07/08/2018

## 2018-06-16 NOTE — Progress Notes (Signed)
Adderall sent to [facility] [address]

## 2018-06-17 ENCOUNTER — Other Ambulatory Visit: Payer: Self-pay | Admitting: Psychiatry

## 2018-06-17 NOTE — Telephone Encounter (Signed)
Patient called saying that she needs a new script sent to [facility]. She gets this drug from the [facility] which has changed from [employer] to this company. NPI # [id number] [employer] # [id number]. Phone number [phone]. If you have any questions please call michelle at [phone].

## 2018-06-18 NOTE — Telephone Encounter (Signed)
Think this is a patient assistance?

## 2018-06-20 ENCOUNTER — Telehealth: Payer: Self-pay | Admitting: Psychiatry

## 2018-06-20 ENCOUNTER — Other Ambulatory Visit: Payer: Self-pay | Admitting: Psychiatry

## 2018-06-20 DIAGNOSIS — G894 Chronic pain syndrome: Secondary | ICD-10-CM

## 2018-06-20 MED ORDER — PREGABALIN 150 MG PO CAPS: 150.0000 mg | ORAL_CAPSULE | Freq: Three times a day (TID) | ORAL | 0 refills | Status: DC

## 2018-06-20 NOTE — Progress Notes (Signed)
    Pt called to ask if you would call in 15 Lyrica 150 mg  [facility]. She knows it will cost out of pocket. She's completely out. Will not receive refill until Monday or Tuesday from mail order.

## 2018-06-20 NOTE — Telephone Encounter (Signed)
Pt called to ask if you would call in 15 Lyrica 150 mg  [facility]. She knows it will cost out of pocket. She's completely out. Will not receive refill until Monday or Tuesday from mail order.

## 2018-06-20 NOTE — Progress Notes (Signed)
Reorder of Lyrica for 56-month supply with no refills

## 2018-06-22 ENCOUNTER — Emergency Department (HOSPITAL_COMMUNITY)
Admission: EM | Admit: 2018-06-22 | Discharge: 2018-06-23 | Disposition: A | Discharge status: Home / Self Care | Payer: Self-pay | Attending: Emergency Medicine | Admitting: Emergency Medicine

## 2018-06-22 ENCOUNTER — Emergency Department (HOSPITAL_COMMUNITY): Payer: Self-pay

## 2018-06-22 ENCOUNTER — Encounter (HOSPITAL_COMMUNITY): Payer: Self-pay | Admitting: Emergency Medicine

## 2018-06-22 DIAGNOSIS — Y929 Unspecified place or not applicable: Secondary | ICD-10-CM | POA: Insufficient documentation

## 2018-06-22 DIAGNOSIS — F329 Major depressive disorder, single episode, unspecified: Secondary | ICD-10-CM | POA: Insufficient documentation

## 2018-06-22 DIAGNOSIS — Y998 Other external cause status: Secondary | ICD-10-CM | POA: Insufficient documentation

## 2018-06-22 DIAGNOSIS — W293XXA Contact with powered garden and outdoor hand tools and machinery, initial encounter: Secondary | ICD-10-CM | POA: Insufficient documentation

## 2018-06-22 DIAGNOSIS — Z79899 Other long term (current) drug therapy: Secondary | ICD-10-CM | POA: Insufficient documentation

## 2018-06-22 DIAGNOSIS — Z23 Encounter for immunization: Secondary | ICD-10-CM | POA: Insufficient documentation

## 2018-06-22 DIAGNOSIS — Y9389 Activity, other specified: Secondary | ICD-10-CM | POA: Insufficient documentation

## 2018-06-22 DIAGNOSIS — S91312A Laceration without foreign body, left foot, initial encounter: Secondary | ICD-10-CM | POA: Insufficient documentation

## 2018-06-22 HISTORY — DX: Anxiety disorder, unspecified: F41.9

## 2018-06-22 HISTORY — DX: Attention-deficit hyperactivity disorder, unspecified type: F90.9

## 2018-06-22 IMAGING — CR DG FOOT COMPLETE 3+V*L*
3 series · 3 of 3 positions shown · non-contrast
Comparison: None.

CLINICAL DATA: Left foot injury from chain saw. Open wound dorsal
foot.

EXAM:
LEFT FOOT - COMPLETE 3+ VIEW

[foot ap]
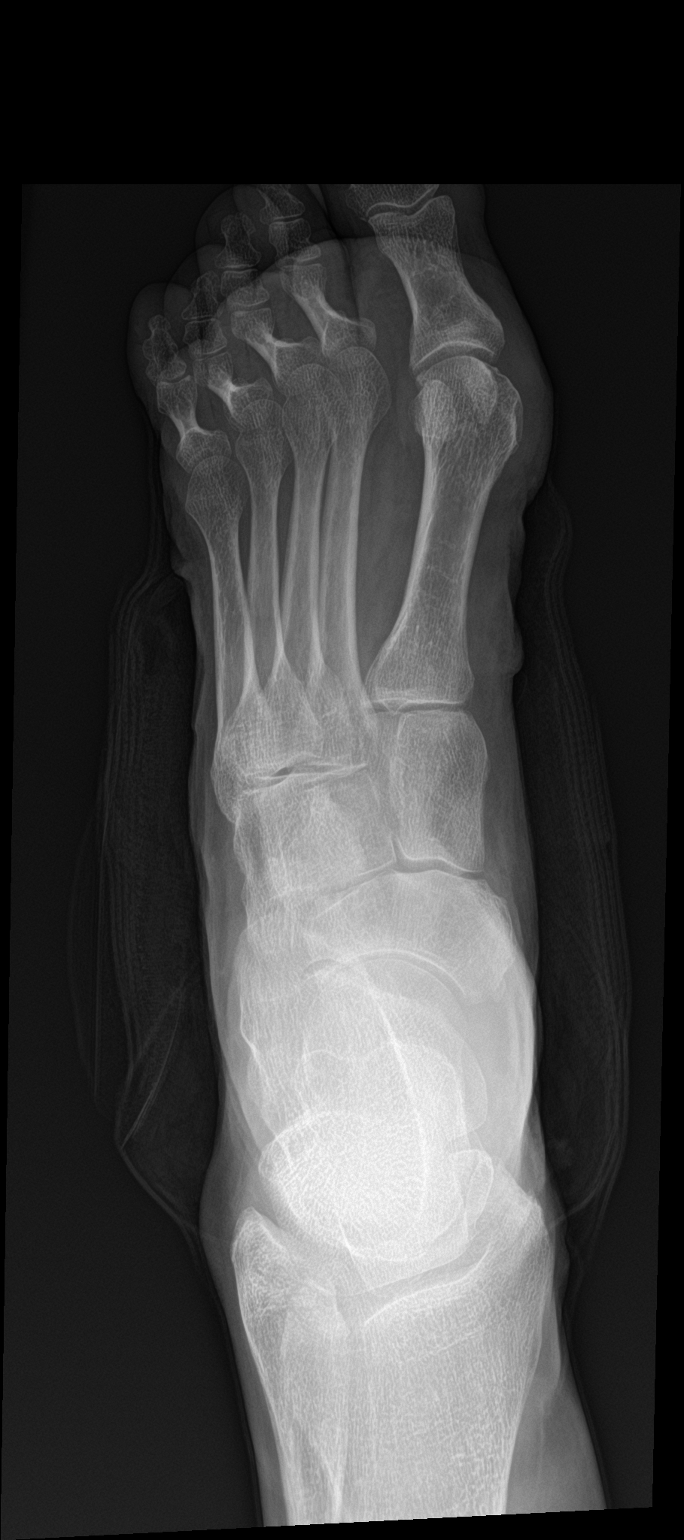

[foot obl]
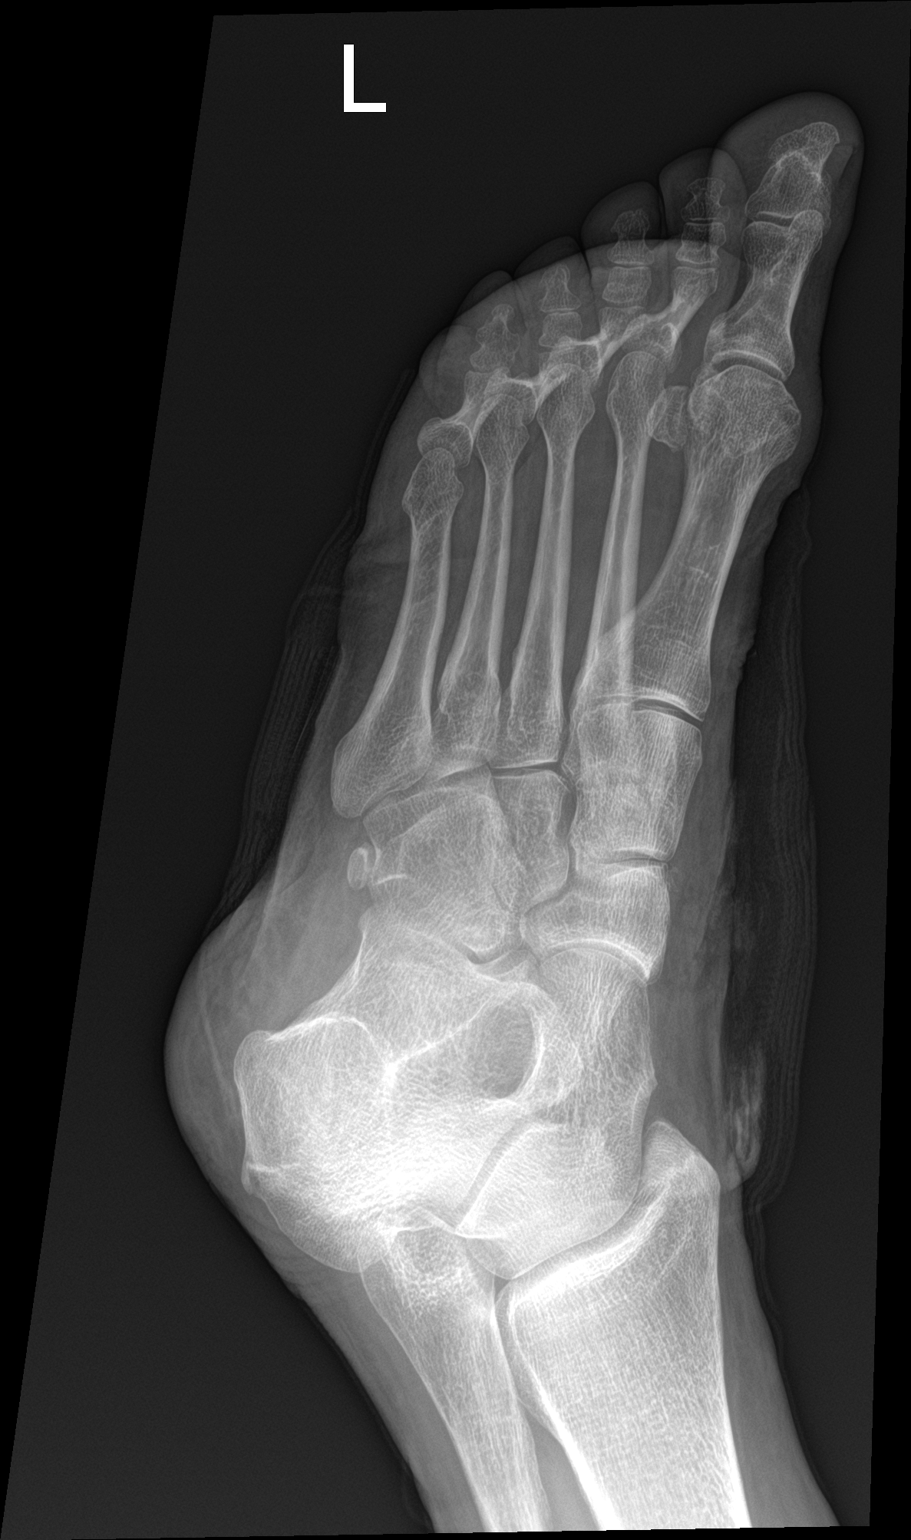

[foot lat]
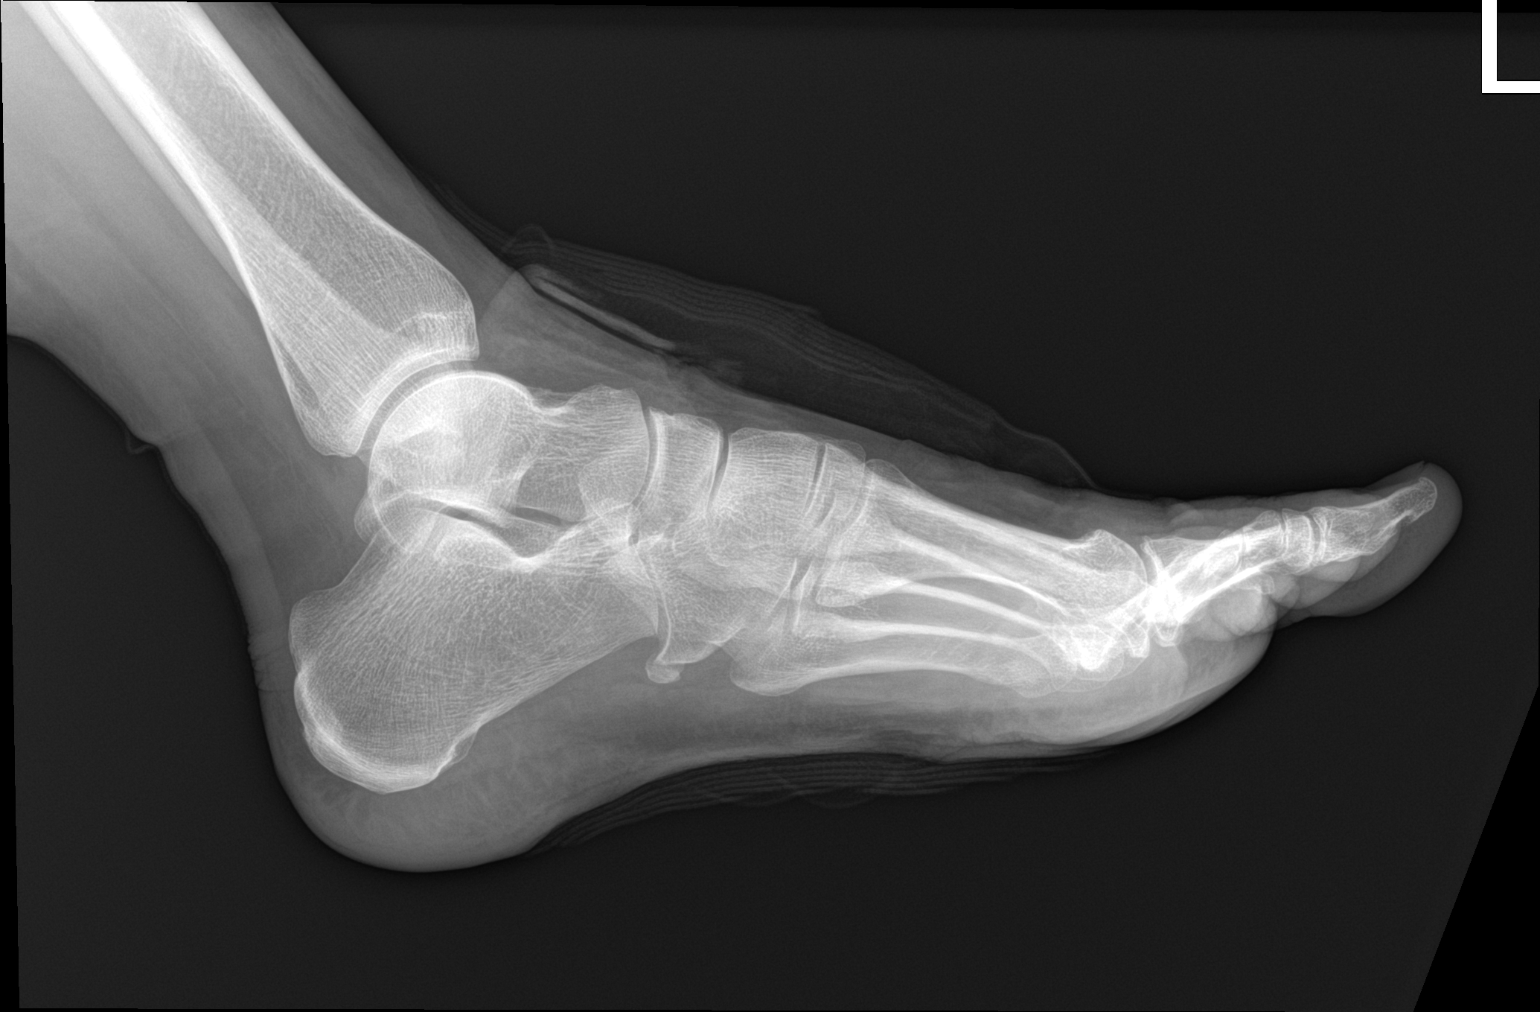

[3 of 3 positions shown; findings below may reference images not displayed]

FINDINGS: No acute fracture or dislocation. Hammertoe deformity of the digits.
No periosteal reaction or bony destructive change. Skin defects
noted overlying the dorsal tarsals consistent with laceration.
Overlying dressing in place. No radiopaque foreign body.
IMPRESSION: Soft tissue injury with laceration. No acute osseous abnormality. No
radiopaque foreign body.

## 2018-06-22 MED ORDER — TETANUS-DIPHTH-ACELL PERTUSSIS 5-2.5-18.5 LF-MCG/0.5 IM SUSP
0.5000 mL | Freq: Once | INTRAMUSCULAR | Status: AC
  Administered 2018-06-23: 0.5 mL via INTRAMUSCULAR
  Filled 2018-06-22: qty 0.5

## 2018-06-22 MED ORDER — FENTANYL CITRATE (PF) 100 MCG/2ML IJ SOLN
100.0000 ug | Freq: Once | INTRAMUSCULAR | Status: AC
  Administered 2018-06-23: 100 ug via INTRAVENOUS
  Filled 2018-06-22: qty 2

## 2018-06-22 NOTE — ED Triage Notes (Signed)
Pt transported from home after chainsaw struck L foot while attempting to cut wood for furnace @ 2200 tonight.  Open wound noted to L dorsal foot, toes appear purple tint on arrival, decreased cap refill.  Tourniquet applied by fire on scene for approx 5 min, strong pulse.

## 2018-06-22 NOTE — ED Notes (Signed)
Pt to Xray via stretcher.

## 2018-06-22 NOTE — ED Provider Notes (Signed)
TIME SEEN: 11:16 PM  CHIEF COMPLAINT: Left foot injury  HPI: Patient is a 53 year old female with history of fibromyalgia, PTSD, chronic pain on oxycodone chronically who presents to the emergency department with a left foot injury that occurred just prior to arrival.  Patient arrives with EMS.  Patient reports that she was cutting wood with a chainsaw when she accidentally cut the top of her left foot.  EMS reports that on their arrival a tourniquet had been placed.  Bleeding had stopped and they remove the tourniquet.  Patient reports some tingling in her left foot which is improving.  Given pain medication in route with EMS.  Unsure of her last tetanus vaccination.  No other injury.  She denies that this was intentional.  ROS: See HPI Constitutional: no fever  Eyes: no drainage  ENT: no runny nose   Cardiovascular:  no chest pain  Resp: no SOB  GI: no vomiting GU: no dysuria Integumentary: no rash  Allergy: no hives  Musculoskeletal: no leg swelling  Neurological: no slurred speech ROS otherwise negative  PAST MEDICAL HISTORY/PAST SURGICAL HISTORY:  Past Medical History:  Diagnosis Date  . Colon polyps   . DEPRESSION 09/02/2008  . Fibromyalgia   . GRIEF REACTION 09/30/2009  . INSOMNIA, CHRONIC 09/02/2008  . PREDIABETES 03/10/2009    MEDICATIONS:  Prior to Admission medications   Medication Sig Start Date End Date Taking? Authorizing Provider  amphetamine-dextroamphetamine (ADDERALL) 30 MG tablet Take 1 tablet by mouth 2 (two) times daily. 06/16/18   Cottle, Billey Co., MD  Oxycodone HCl 10 MG TABS Take one tablet every 6 hours.  May refill in one month. 04/04/18   Burchette, Alinda Sierras, MD  Oxycodone HCl 10 MG TABS Take 1 tablet (10 mg total) by mouth 4 (four) times daily. 04/04/18   Burchette, Alinda Sierras, MD  Oxycodone HCl 10 MG TABS Take one tablet every 6 hours.  May refill in two months. 04/04/18   Burchette, Alinda Sierras, MD  PARoxetine (PAXIL) 40 MG tablet TAKE 1& 1/2 TABLET BY MOUTH EVERY  DAY 12/06/14   [provider]  pregabalin (LYRICA) 150 MG capsule Take 1 capsule (150 mg total) by mouth 3 (three) times daily. 06/20/18   Cottle, Billey Co., MD    ALLERGIES:  No Known Allergies  SOCIAL HISTORY:  Social History   Tobacco Use  . Smoking status: Never Smoker  . Smokeless tobacco: Never Used  Substance Use Topics  . Alcohol use: No    FAMILY HISTORY: Family History  Problem Relation Age of Onset  . Arthritis Mother        rhematiod  . Diabetes Maternal Grandmother   . Depression Neg Hx        family    EXAM: There were no vitals taken for this visit. CONSTITUTIONAL: Alert and oriented and responds appropriately to questions. Well-appearing; well-nourished HEAD: Normocephalic EYES: Conjunctivae clear, pupils appear equal, EOMI ENT: normal nose; moist mucous membranes NECK: Supple, no meningismus, no nuchal rigidity, no LAD  CARD: RRR; S1 and S2 appreciated; no murmurs, no clicks, no rubs, no gallops RESP: Normal chest excursion without splinting or tachypnea; breath sounds clear and equal bilaterally; no wheezes, no rhonchi, no rales, no hypoxia or respiratory distress, speaking full sentences ABD/GI: Normal bowel sounds; non-distended; soft, non-tender, no rebound, no guarding, no peritoneal signs, no hepatosplenomegaly BACK:  The back appears normal and is non-tender to palpation, there is no CVA tenderness EXT: Patient has a 6 cm laceration  to the dorsal aspect of the left foot with slow ooze of bleeding.  There is no arterial bleeding noted at this time.  She has a strong palpable DP pulse in the left foot.  She has delayed capillary refill in her toes and they do appear dusky at this time.  She reports that her toes are tingling and she has some diminished sensation but improving.  Foot is slightly cool compared to the right side.  Full range of motion in her toes and ankle on the left side.  No tenderness proximally.  Compartments soft.  No other  extremity injury. SKIN: Normal color for age and race; warm; no rash NEURO: Moves all extremities equally PSYCH: The patient's mood and manner are appropriate. Grooming and personal hygiene are appropriate.  MEDICAL DECISION MAKING: Patient here with accidental chainsaw injury to the top of the left foot.  Her toes are dusky, cool with some tingling.  This may be from tourniquet being used in the field.  She reports tingling is improving.  We will monitor this closely.  She does have a strong palpable distal pulse on exam.  Will obtain x-rays of her foot in the ED to see if there is any bony involvement.  Will update tetanus vaccination.  Will provide pain medication.  ED PROGRESS: X-ray shows no osseous abnormality.  On multiple reexaminations of patient's foot it does not appear she has any tendon involvement.  She has full normal range of motion in all 5 toes and in her ankle.  Reports normal sensation diffusely.  Foot is now warm, pink with normal capillary refill.  Again suspect that tourniquet use is what made her foot dusky and cool with tingling earlier and this has completely resolved.  Wound has been repaired.  I do not feel she needs prophylactic antibiotics.  I feel she is safe to be discharged home and alternate Tylenol and Motrin for pain as needed.  Discussed wound care instructions.  At this time, I do not feel there is any life-threatening condition present. I have reviewed and discussed all results (EKG, imaging, lab, urine as appropriate) and exam findings with patient/family. I have reviewed nursing notes and appropriate previous records.  I feel the patient is safe to be discharged home without further emergent workup and can continue workup as an outpatient as needed. Discussed usual and customary return precautions. Patient/family verbalize understanding and are comfortable with this plan.  Outpatient follow-up has been provided as needed. All questions have been answered.       Anand Tejada, Delice Bison, DO 06/23/18 0210

## 2018-06-23 ENCOUNTER — Encounter: Payer: Self-pay | Admitting: Family Medicine

## 2018-06-23 MED ORDER — LIDOCAINE-EPINEPHRINE (PF) 2 %-1:200000 IJ SOLN
10.0000 mL | Freq: Once | INTRAMUSCULAR | Status: AC
  Administered 2018-06-23: 10 mL via INTRADERMAL
  Filled 2018-06-23: qty 20

## 2018-06-23 MED ORDER — OXYCODONE-ACETAMINOPHEN 5-325 MG PO TABS
2.0000 | ORAL_TABLET | Freq: Once | ORAL | Status: AC
  Administered 2018-06-23: 2 via ORAL
  Filled 2018-06-23: qty 2

## 2018-06-23 MED ORDER — MORPHINE SULFATE (PF) 4 MG/ML IV SOLN
4.0000 mg | Freq: Once | INTRAVENOUS | Status: AC
  Administered 2018-06-23: 4 mg via INTRAVENOUS
  Filled 2018-06-23: qty 1

## 2018-06-23 NOTE — Initial Assessments (Signed)
Pt's toes on L foot now pink with appropriate cap refill

## 2018-06-23 NOTE — Discharge Instructions (Addendum)
You may alternate Tylenol 1000 mg every 6 hours as needed for pain and Ibuprofen 800 mg every 8 hours as needed for pain.  Please take Ibuprofen with food. ° °

## 2018-06-23 NOTE — ED Notes (Signed)
Pt verbalized understanding of d/c instructions and follow up. Pt to waiting room via [facility]

## 2018-06-23 NOTE — ED Provider Notes (Signed)
..  Laceration Repair Date/Time: 06/23/2018 2:00 AM Performed by: Abigail Butts, PA-C Authorized by: Abigail Butts, PA-C   Consent:    Consent obtained:  Verbal   Consent given by:  Patient   Risks discussed:  Infection, need for additional repair, pain, poor cosmetic result and poor wound healing   Alternatives discussed:  No treatment and delayed treatment Universal protocol:    Procedure explained and questions answered to patient or proxy's satisfaction: yes     Relevant documents present and verified: yes     Test results available and properly labeled: yes     Imaging studies available: yes     Required blood products, implants, devices, and special equipment available: yes     Site/side marked: yes     Immediately prior to procedure, a time out was called: yes     Patient identity confirmed:  Verbally with patient and arm band Anesthesia (see MAR for exact dosages):    Anesthesia method:  Local infiltration   Local anesthetic:  Lidocaine 2% WITH epi Laceration details:    Location:  Foot   Foot location:  Top of L foot   Length (cm):  6 Repair type:    Repair type:  Intermediate Pre-procedure details:    Preparation:  Patient was prepped and draped in usual sterile fashion and imaging obtained to evaluate for foreign bodies Exploration:    Hemostasis achieved with:  Epinephrine and direct pressure   Wound exploration: wound explored through full range of motion and entire depth of wound probed and visualized     Contaminated: yes   Treatment:    Area cleansed with:  Saline   Amount of cleaning:  Extensive   Irrigation solution:  Sterile water   Irrigation volume:  1000   Irrigation method:  Syringe   Visualized foreign bodies/material removed: yes   Skin repair:    Repair method:  Sutures   Suture size:  3-0   Suture material:  Prolene   Suture technique:  Horizontal mattress and simple interrupted   Number of sutures:  4 Approximation:   Approximation:  Close Post-procedure details:    Dressing:  Non-adherent dressing and bulky dressing   Patient tolerance of procedure:  Tolerated well, no immediate complications     Toni Collins 06/23/18 0202    Ward, Delice Bison, DO 06/23/18 [phone]

## 2018-06-23 NOTE — ED Notes (Signed)
PA at bedside for wound repair.

## 2018-06-27 ENCOUNTER — Other Ambulatory Visit: Payer: Self-pay

## 2018-06-27 ENCOUNTER — Ambulatory Visit (INDEPENDENT_AMBULATORY_CARE_PROVIDER_SITE_OTHER): Payer: Self-pay | Admitting: Family Medicine

## 2018-06-27 ENCOUNTER — Encounter: Payer: Self-pay | Admitting: Family Medicine

## 2018-06-27 VITALS — BP 110/78 | HR 97 | Temp 98.3°F | Wt 156.2 lb

## 2018-06-27 DIAGNOSIS — L03116 Cellulitis of left lower limb: Secondary | ICD-10-CM

## 2018-06-27 DIAGNOSIS — S91312D Laceration without foreign body, left foot, subsequent encounter: Secondary | ICD-10-CM

## 2018-06-27 MED ORDER — CEPHALEXIN 500 MG PO CAPS: 500.0000 mg | ORAL_CAPSULE | Freq: Four times a day (QID) | ORAL | 0 refills | Status: DC

## 2018-06-27 MED ORDER — OXYCODONE-ACETAMINOPHEN 10-325 MG PO TABS: ORAL_TABLET | ORAL | 0 refills | Status: DC

## 2018-06-27 NOTE — Patient Instructions (Signed)
Get on the Keflex as soon as possible  Elevate foot frequently

## 2018-06-27 NOTE — Progress Notes (Signed)
  Subjective:     Patient ID: Toni Collins, female   DOB: 1966-03-05, 53 y.o.   MRN: [id number]  HPI Patient is seen for follow-up regarding recent foot injury.  She cut her left foot few days ago with a chainsaw.  She was taken by EMS to the ER and had sutures placed.  Fortunately there were no bone or tendon injuries.  She has had some mild redness past couple days.  No fevers or chills.  No drainage.  She has had significant pain above and beyond her chronic back pain.  She has taken 1 extra oxycodone over the past couple days.  We had advised frequent elevation.  She did receive tetanus booster  Past Medical History:  Diagnosis Date  . ADHD   . Anxiety   . Colon polyps   . DEPRESSION 09/02/2008  . Fibromyalgia   . GRIEF REACTION 09/30/2009  . INSOMNIA, CHRONIC 09/02/2008  . PREDIABETES 03/10/2009   Past Surgical History:  Procedure Laterality Date  . ABDOMINAL HYSTERECTOMY  2002   TAH  . [place], 2000   x4  . TMJ ARTHROPLASTY     x2    reports that she has never smoked. She has never used smokeless tobacco. She reports previous drug use. She reports that she does not drink alcohol. family history includes Arthritis in her mother; Diabetes in her maternal grandmother. No Known Allergies   Review of Systems  Constitutional: Negative for chills and fever.       Objective:   Physical Exam Constitutional:      Appearance: Normal appearance.  Musculoskeletal:     Comments: She is able to fully plantarflex and dorsiflex and is also able to flex and extend all of her toes without difficulty  Skin:    Comments: Left foot dorsally and proximally reveals wound that is healing well.  There is some mild surrounding erythema.  No drainage.  No fluctuance.  No warmth.  Neurological:     Mental Status: She is alert.        Assessment:     Laceration left foot with possible early cellulitis changes.    Plan:     -Elevate foot  frequently -Keflex 500 mg 4 times daily for 7 days -We wrote for extra oxycodone 10 mg number 7 tablets to take 1 extra tablet per day for the next few days as needed -She has follow-up next week to reassess and suture removal and follow-up sooner as needed  Eulas Post MD [facility] at [facility]

## 2018-07-02 ENCOUNTER — Other Ambulatory Visit: Payer: Self-pay

## 2018-07-02 ENCOUNTER — Ambulatory Visit: Payer: Self-pay | Admitting: Family Medicine

## 2018-07-02 ENCOUNTER — Ambulatory Visit (INDEPENDENT_AMBULATORY_CARE_PROVIDER_SITE_OTHER): Payer: Self-pay | Admitting: Family Medicine

## 2018-07-02 ENCOUNTER — Encounter: Payer: Self-pay | Admitting: Family Medicine

## 2018-07-02 VITALS — BP 122/74 | HR 105 | Temp 98.1°F | Wt 153.4 lb

## 2018-07-02 DIAGNOSIS — G894 Chronic pain syndrome: Secondary | ICD-10-CM

## 2018-07-02 DIAGNOSIS — S91312D Laceration without foreign body, left foot, subsequent encounter: Secondary | ICD-10-CM

## 2018-07-02 DIAGNOSIS — M2550 Pain in unspecified joint: Secondary | ICD-10-CM

## 2018-07-02 LAB — SEDIMENTATION RATE: Sed Rate: 24 mm/hr (ref 0–30)

## 2018-07-02 MED ORDER — OXYCODONE HCL 10 MG PO TABS: ORAL_TABLET | ORAL | 0 refills | Status: DC

## 2018-07-02 MED ORDER — OXYCODONE HCL 10 MG PO TABS: 10.0000 mg | ORAL_TABLET | Freq: Four times a day (QID) | ORAL | 0 refills | Status: DC

## 2018-07-02 NOTE — Progress Notes (Signed)
Subjective:     Patient ID: Toni Collins, female   DOB: 02-26-1966, 53 y.o.   MRN: [id number]  HPI Patient is seen for the following issues  Recent laceration left foot from chainsaw on 06/22/2018.  She is here for suture removal.  She had some mild surrounding redness and we started some Keflex last week.  No fevers or chills.  No significant drainage.  Still has some increased pain with ambulation.  Patient has longstanding history of fibromyalgia.  She has chronic pain and takes oxycodone 4 times daily.  She has had some increased pain regarding her foot recently related to the injury.  She is due for refills at this time.  She had urine drug screen back in November with no red flags.  [facility] drug registry is evaluated and there are no red flags.  She complains of arthralgias involving both shoulders, wrists, and hands.  She has changes of osteoarthritis involving several digits.  She is concerned about possible rheumatoid arthritis.  She has some joint stiffness as well.  Past Medical History:  Diagnosis Date  . ADHD   . Anxiety   . Colon polyps   . DEPRESSION 09/02/2008  . Fibromyalgia   . GRIEF REACTION 09/30/2009  . INSOMNIA, CHRONIC 09/02/2008  . PREDIABETES 03/10/2009   Past Surgical History:  Procedure Laterality Date  . ABDOMINAL HYSTERECTOMY  2002   TAH  . [place], 2000   x4  . TMJ ARTHROPLASTY     x2    reports that she has never smoked. She has never used smokeless tobacco. She reports previous drug use. She reports that she does not drink alcohol. family history includes Arthritis in her mother; Diabetes in her maternal grandmother. No Known Allergies   Review of Systems  Constitutional: Negative for fatigue.  Eyes: Negative for visual disturbance.  Respiratory: Negative for cough, chest tightness, shortness of breath and wheezing.   Cardiovascular: Negative for chest pain, palpitations and leg swelling.   Musculoskeletal: Positive for arthralgias and back pain.  Neurological: Negative for dizziness, seizures, syncope, weakness, light-headedness and headaches.       Objective:   Physical Exam Constitutional:      Appearance: Normal appearance.  Neck:     Musculoskeletal: Neck supple.  Cardiovascular:     Rate and Rhythm: Normal rate and regular rhythm.  Pulmonary:     Effort: Pulmonary effort is normal.     Breath sounds: Normal breath sounds.  Musculoskeletal:     Right lower leg: No edema.     Left lower leg: No edema.     Comments: She has some nodular changes of several digits consistent with osteoarthritis of the hands.  We did not see any erythema, warmth, or effusion involving her wrist or any other joints  Skin:    Comments: Prominent varicosities in lower extremities bilaterally  Left foot wound examined.  She has 4 sutures which are removed today.  A couple of these were fairly deeply buried and had to be lifted up significantly to get out.   only very minimal surrounding erythema.  She has some normal exudative material related to healing in the wound.  No purulent drainage.  Neurological:     Mental Status: She is alert.        Assessment:     #1 chronic back pain and chronic fibromyalgia pain  #2 recent left foot wound related to chainsaw accident.  Possible mild cellulitis changes which seem  to be improving with Keflex  #3 diffuse arthralgias.  She has clinical changes consistent with osteoarthritis of the hands.  She is worried about rheumatoid arthritis.    Plan:     -Sutures removed as above -Refill her pain medication for 3 months -Drug screen has been done within the past year.  Drug registry is reviewed with no red flags -Check further labs with sed rate, rheumatoid factor, CCP antibody, antinuclear antibody -Discussed further wound care.  Steri-Strips applied.  Continue to clean with soap and water.  Suspect this will continue to heal.  Follow-up for  any increased erythema or other concerns  Eulas Post MD [facility] at [facility]

## 2018-07-03 LAB — RHEUMATOID FACTOR

## 2018-07-03 LAB — CYCLIC CITRUL PEPTIDE ANTIBODY, IGG

## 2018-07-03 LAB — ANA: Anti Nuclear Antibody(ANA): NEGATIVE

## 2018-07-04 ENCOUNTER — Ambulatory Visit: Payer: Self-pay | Admitting: Family Medicine

## 2018-07-08 ENCOUNTER — Encounter: Payer: Self-pay | Admitting: Family Medicine

## 2018-07-08 ENCOUNTER — Encounter: Payer: Self-pay | Admitting: Psychiatry

## 2018-07-08 ENCOUNTER — Ambulatory Visit (INDEPENDENT_AMBULATORY_CARE_PROVIDER_SITE_OTHER): Payer: Self-pay | Admitting: Psychiatry

## 2018-07-08 VITALS — BP 130/72 | HR 87

## 2018-07-08 DIAGNOSIS — F39 Unspecified mood [affective] disorder: Secondary | ICD-10-CM

## 2018-07-08 DIAGNOSIS — F411 Generalized anxiety disorder: Secondary | ICD-10-CM

## 2018-07-08 DIAGNOSIS — F4310 Post-traumatic stress disorder, unspecified: Secondary | ICD-10-CM

## 2018-07-08 DIAGNOSIS — F902 Attention-deficit hyperactivity disorder, combined type: Secondary | ICD-10-CM

## 2018-07-08 MED ORDER — AMPHETAMINE-DEXTROAMPHETAMINE 30 MG PO TABS: 30.0000 mg | ORAL_TABLET | Freq: Two times a day (BID) | ORAL | 0 refills | Status: DC
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
## 2018-07-09 ENCOUNTER — Other Ambulatory Visit: Payer: Self-pay

## 2018-07-09 ENCOUNTER — Ambulatory Visit (INDEPENDENT_AMBULATORY_CARE_PROVIDER_SITE_OTHER): Payer: Self-pay | Admitting: Family Medicine

## 2018-07-09 ENCOUNTER — Encounter: Payer: Self-pay | Admitting: Family Medicine

## 2018-07-09 VITALS — BP 124/96 | HR 120 | Temp 98.1°F | Ht 62.75 in | Wt 150.0 lb

## 2018-07-09 DIAGNOSIS — M25511 Pain in right shoulder: Secondary | ICD-10-CM

## 2018-07-09 DIAGNOSIS — M7581 Other shoulder lesions, right shoulder: Secondary | ICD-10-CM

## 2018-07-09 MED ORDER — METHYLPREDNISOLONE ACETATE 40 MG/ML IJ SUSP
40.0000 mg | Freq: Once | INTRAMUSCULAR | Status: AC
  Administered 2018-07-09: 40 mg via INTRA_ARTICULAR

## 2018-07-09 NOTE — Progress Notes (Signed)
  Subjective:     Patient ID: Toni Collins, female   DOB: 05/31/1965, 53 y.o.   MRN: [id number]  HPI  Patient is seen with right shoulder pain.  She has had similar problems with her left shoulder in the past.  Current pain started about 2 weeks ago.  She is right-hand dominant.  Denies any specific injury.  She has been splitting some wood recently which she thinks may have exacerbated.  Her pain has been progressive over 2 weeks.  She has some increased night pain and has pain mostly with abduction but also slightly with internal rotation.  No pain with adduction.  No neck pain.  Denies any upper extremity numbness or weakness.  She is specifically requesting steroid injection which she has had in the left shoulder with some benefit in the past.  She already takes chronic opioids for chronic back pain.  Past Medical History:  Diagnosis Date  . ADHD   . Anxiety   . Colon polyps   . DEPRESSION 09/02/2008  . Fibromyalgia   . GRIEF REACTION 09/30/2009  . INSOMNIA, CHRONIC 09/02/2008  . PREDIABETES 03/10/2009   Past Surgical History:  Procedure Laterality Date  . ABDOMINAL HYSTERECTOMY  2002   TAH  . [place], 2000   x4  . TMJ ARTHROPLASTY     x2    reports that she has never smoked. She has never used smokeless tobacco. She reports previous drug use. She reports that she does not drink alcohol. family history includes Arthritis in her mother; Diabetes in her maternal grandmother. No Known Allergies  Review of Systems  Musculoskeletal: Negative for neck pain.  Neurological: Negative for weakness and numbness.       Objective:   Physical Exam Constitutional:      Appearance: Normal appearance.  Neck:     Musculoskeletal: Normal range of motion and neck supple.  Musculoskeletal:     Comments: Mild right subacromial tenderness.  Otherwise no localized tenderness.  She has pain with abduction and internal rotation.  No biceps tenderness   Neurological:     Mental Status: She is alert.     Comments: She has fairly good strength in testing rotator cuff but does have significant pain which limits her abduction.        Assessment:     Right shoulder pain.  Suspect rotator cuff tendinitis-not relieved with icing and over-the-counter medications    Plan:     - Discussed risks and benefits of corticosteroid injection and patient consented.  After prepping skin with betadine, injected 40 mg depomedrol and 2 cc of plain xylocaine with 25 gauge one and one half inch needle using posterior lateral approach and pt tolerated well. -Recommend some icing later today and tonight -Gentle range of motion to avoid adhesive capsulitis -She already has some home exercises that she was instructed in previously with physical therapy and she will start with those -Touch base in 2 to 3 weeks if not improving  Eulas Post MD [facility] at [facility]

## 2018-07-09 NOTE — Patient Instructions (Signed)
Rotator Cuff Tendinitis  Rotator cuff tendinitis is inflammation of the tough, cord-like bands that connect muscle to bone (tendons) in the rotator cuff. The rotator cuff includes all of the muscles and tendons that connect the arm to the shoulder. The rotator cuff holds the head of the upper arm bone (humerus) in the cup (fossa) of the shoulder blade (scapula). This condition can lead to a long-lasting (chronic) tear. The tear may be partial or complete. What are the causes? This condition is usually caused by overusing the rotator cuff. What increases the risk? This condition is more likely to develop in athletes and workers who frequently use their shoulder or reach over their heads. This can include activities such as:  Tennis.  Baseball or softball.  Swimming.  Construction work.  Painting. What are the signs or symptoms? Symptoms of this condition include:  Pain spreading (radiating) from the shoulder to the upper arm.  Swelling and tenderness in front of the shoulder.  Pain when reaching, pulling, or lifting the arm above the head.  Pain when lowering the arm from above the head.  Minor pain in the shoulder when resting.  Increased pain in the shoulder at night.  Difficulty placing the arm behind the back. How is this diagnosed? This condition is diagnosed with a medical history and physical exam. Tests may also be done, including:  X-rays.  MRI.  Ultrasounds.  CT or MR arthrogram. During this test, a contrast material is injected and then images are taken. How is this treated? Treatment for this condition depends on the severity of the condition. In less severe cases, treatment may include:  Rest. This may be done with a sling that holds the shoulder still (immobilization). Your health care provider may also recommend avoiding activities that involve lifting your arm over your head.  Icing the shoulder.  Anti-inflammatory medicines, such as aspirin or  ibuprofen. In more severe cases, treatment may include:  Physical therapy.  Steroid injections.  Surgery. Follow these instructions at home: If you have a sling:  Wear the sling as told by your health care provider. Remove it only as told by your health care provider.  Loosen the sling if your fingers tingle, become numb, or turn cold and blue.  Keep the sling clean.  If the sling is not waterproof, do not let it get wet. Remove it, if allowed, or cover it with a watertight covering when you take a bath or shower. Managing pain, stiffness, and swelling  If directed, put ice on the injured area. ? If you have a removable sling, remove it as told by your health care provider. ? Put ice in a plastic bag. ? Place a towel between your skin and the bag. ? Leave the ice on for 20 minutes, 2-3 times a day.  Move your fingers often to avoid stiffness and to lessen swelling.  Raise (elevate) the injured area above the level of your heart while you are lying down.  Find a comfortable sleeping position or sleep on a recliner, if available. Driving  Do not drive or use heavy machinery while taking prescription pain medicine.  Ask your health care provider when it is safe to drive if you have a sling on your arm. Activity  Rest your shoulder as told by your health care provider.  Return to your normal activities as told by your health care provider. Ask your health care provider what activities are safe for you.  Do any exercises or stretches as   told by your health care provider.  If you do repetitive overhead tasks, take small breaks in between and include stretching exercises as told by your health care provider. General instructions  Do not use any products that contain nicotine or tobacco, such as cigarettes and e-cigarettes. These can delay healing. If you need help quitting, ask your health care provider.  Take over-the-counter and prescription medicines only as told by your  health care provider.  Keep all follow-up visits as told by your health care provider. This is important. Contact a health care provider if:  Your pain gets worse.  You have new pain in your arm, hands, or fingers.  Your pain is not relieved with medicine or does not get better after 6 weeks of treatment.  You have cracking sensations when moving your shoulder in certain directions.  You hear a snapping sound after using your shoulder, followed by severe pain and weakness. Get help right away if:  Your arm, hand, or fingers are numb or tingling.  Your arm, hand, or fingers are swollen or painful or they turn white or blue. Summary  Rotator cuff tendinitis is inflammation of the tough, cord-like bands that connect muscle to bone (tendons) in the rotator cuff.  This condition is usually caused by overusing the rotator cuff, which includes all of the muscles and tendons that connect the arm to the shoulder.  This condition is more likely to develop in athletes and workers who frequently use their shoulder or reach over their heads.  Treatment generally includes rest, anti-inflammatory medicines, and icing. In some cases, physical therapy and steroid injections may be needed. In severe cases, surgery may be needed. This information is not intended to replace advice given to you by your health care provider. Make sure you discuss any questions you have with your health care provider. Document Released: 08/04/2003 Document Revised: 04/30/2016 Document Reviewed: 04/30/2016 Elsevier Interactive Patient Education  2019 [employer] Inc[employer]  

## 2018-08-28 ENCOUNTER — Encounter: Payer: Self-pay | Admitting: Family Medicine

## 2018-08-29 ENCOUNTER — Ambulatory Visit (INDEPENDENT_AMBULATORY_CARE_PROVIDER_SITE_OTHER): Payer: Self-pay | Admitting: Family Medicine

## 2018-08-29 ENCOUNTER — Other Ambulatory Visit: Payer: Self-pay

## 2018-08-29 DIAGNOSIS — R110 Nausea: Secondary | ICD-10-CM

## 2018-08-29 MED ORDER — ONDANSETRON 4 MG PO TBDP: 4.0000 mg | ORAL_TABLET | Freq: Three times a day (TID) | ORAL | 1 refills | Status: DC | PRN

## 2018-08-29 NOTE — Progress Notes (Signed)
Patient ID: Toni Collins, female   DOB: November 03, 1965, 53 y.o.   MRN: [id number]  Virtual Visit via Video Note  I connected with [name] on 08/29/18 at  9:45 AM EDT by a video enabled telemedicine application and verified that I am speaking with the correct person using two identifiers.  Location patient: home Location provider:work or home office Persons participating in the virtual visit: patient, provider  I discussed the limitations of evaluation and management by telemedicine and the availability of in person appointments. The patient expressed understanding and agreed to proceed.   HPI: Patient called with some issues with some low-grade nausea for the past week or so.  She has had no vomiting.  Still able to eat about 3 or 4 meals per day.  Keeping down fluids.  She feels some of this may be stress related.  No abdominal pain.  No fevers or chills.  No dysuria.  No cough or shortness of breath. No diarrhea.  No recent travels.  Patient relates similar nausea in the past with anxiety.  She has tolerated Zofran without difficulty She has had previous hysterectomy so no risk for pregnancy   ROS: See pertinent positives and negatives per HPI.  Past Medical History:  Diagnosis Date  . ADHD   . Anxiety   . Colon polyps   . DEPRESSION 09/02/2008  . Fibromyalgia   . GRIEF REACTION 09/30/2009  . INSOMNIA, CHRONIC 09/02/2008  . PREDIABETES 03/10/2009    Past Surgical History:  Procedure Laterality Date  . ABDOMINAL HYSTERECTOMY  2002   TAH  . [place], 2000   x4  . TMJ ARTHROPLASTY     x2    Family History  Problem Relation Age of Onset  . Arthritis Mother        rhematiod  . Diabetes Maternal Grandmother   . Depression Neg Hx        family    SOCIAL HX: Non-smoker.  No alcohol use.  Lives with husband   Current Outpatient Medications:  .  amphetamine-dextroamphetamine (ADDERALL) 30 MG tablet, Take 1 tablet by mouth 2 (two)  times daily., Disp: 60 tablet, Rfl: 0 .  amphetamine-dextroamphetamine (ADDERALL) 30 MG tablet, Take 1 tablet by mouth 2 (two) times daily., Disp: 60 tablet, Rfl: 0 .  [START ON 09/02/2018] amphetamine-dextroamphetamine (ADDERALL) 30 MG tablet, Take 1 tablet by mouth 2 (two) times daily., Disp: 60 tablet, Rfl: 0 .  ondansetron (ZOFRAN ODT) 4 MG disintegrating tablet, Take 1 tablet (4 mg total) by mouth every 8 (eight) hours as needed for nausea or vomiting., Disp: 20 tablet, Rfl: 1 .  Oxycodone HCl 10 MG TABS, Take one tablet every 6 hours.  May refill in two months., Disp: 120 tablet, Rfl: 0 .  Oxycodone HCl 10 MG TABS, Take one tablet every 6 hours.  May refill in one month., Disp: 120 tablet, Rfl: 0 .  Oxycodone HCl 10 MG TABS, Take 1 tablet (10 mg total) by mouth 4 (four) times daily., Disp: 120 tablet, Rfl: 0 .  PARoxetine (PAXIL) 40 MG tablet, Take 60 mg by mouth daily. , Disp: , Rfl: 5 .  pregabalin (LYRICA) 150 MG capsule, Take 1 capsule (150 mg total) by mouth 3 (three) times daily., Disp: 15 capsule, Rfl: 0  EXAM:  VITALS per patient if applicable:  GENERAL: alert, oriented, appears well and in no acute distress  HEENT: atraumatic, conjunttiva clear, no obvious abnormalities on inspection of external nose and ears  NECK: normal movements of the head and neck  LUNGS: on inspection no signs of respiratory distress, breathing rate appears normal, no obvious gross SOB, gasping or wheezing  CV: no obvious cyanosis  MS: moves all visible extremities without noticeable abnormality  PSYCH/NEURO: pleasant and cooperative, no obvious depression or anxiety, speech and thought processing grossly intact  ASSESSMENT AND PLAN:  Discussed the following assessment and plan:  Low-grade nausea.  Question stress reaction.  She does not have any associated vomiting, abdominal pain, or other concerning symptoms.  She also describes similar symptoms in the past with stress  -Discussed  nonpharmacologic ways of dealing with stress-such as turning off the news, and engaging in hobbies, exercise as tolerated -Zofran 4 mg ODT every 8 hours as needed for nausea -Follow-up for any abdominal pain, vomiting, or other concerns     I discussed the assessment and treatment plan with the patient. The patient was provided an opportunity to ask questions and all were answered. The patient agreed with the plan and demonstrated an understanding of the instructions.   The patient was advised to call back or seek an in-person evaluation if the symptoms worsen or if the condition fails to improve as anticipated.   Carolann Littler, MD

## 2018-09-03 ENCOUNTER — Other Ambulatory Visit: Payer: Self-pay

## 2018-09-03 ENCOUNTER — Encounter: Payer: Self-pay | Admitting: Family Medicine

## 2018-09-03 ENCOUNTER — Ambulatory Visit (INDEPENDENT_AMBULATORY_CARE_PROVIDER_SITE_OTHER): Payer: Self-pay | Admitting: Family Medicine

## 2018-09-03 DIAGNOSIS — F419 Anxiety disorder, unspecified: Secondary | ICD-10-CM

## 2018-09-03 NOTE — Progress Notes (Signed)
Patient ID: Toni Collins, female   DOB: 1965/10/13, 53 y.o.   MRN: [id number]  Virtual Visit via Video Note  I connected with [name] on 09/03/18 at  3:00 PM EDT by a video enabled telemedicine application and verified that I am speaking with the correct person using two identifiers.  Location patient: home Location provider:work or home office Persons participating in the virtual visit: patient, provider  I discussed the limitations of evaluation and management by telemedicine and the availability of in person appointments. The patient expressed understanding and agreed to proceed.   HPI: Patient has long history of anxiety and depression.  She actually sees a psychiatrist currently.  She had been taking Paxil for several years at fairly high dose of 60 mg daily and basically tapered herself off about 6 weeks ago.  She had recent increased anxiety symptoms and some crying spells with recent stressors of coronavirus pandemic.  She states that when she was taking Paxil she was having frequent nightmares and also weight gain.  She states she feels much better overall since stopping the Paxil.  However, she is concerned about the anxiety symptoms.  Years ago she took Zoloft and felt that worked for several years but eventually seemed to lose some of its effect.  She had questions about whether it may be reasonable to go back on that now.   ROS: See pertinent positives and negatives per HPI.  Past Medical History:  Diagnosis Date  . ADHD   . Anxiety   . Colon polyps   . DEPRESSION 09/02/2008  . Fibromyalgia   . GRIEF REACTION 09/30/2009  . INSOMNIA, CHRONIC 09/02/2008  . PREDIABETES 03/10/2009    Past Surgical History:  Procedure Laterality Date  . ABDOMINAL HYSTERECTOMY  2002   TAH  . [place], 2000   x4  . TMJ ARTHROPLASTY     x2    Family History  Problem Relation Age of Onset  . Arthritis Mother        rhematiod  . Diabetes  Maternal Grandmother   . Depression Neg Hx        family    SOCIAL HX: Non-smoker.  No alcohol.   Current Outpatient Medications:  .  amphetamine-dextroamphetamine (ADDERALL) 30 MG tablet, Take 1 tablet by mouth 2 (two) times daily., Disp: 60 tablet, Rfl: 0 .  amphetamine-dextroamphetamine (ADDERALL) 30 MG tablet, Take 1 tablet by mouth 2 (two) times daily., Disp: 60 tablet, Rfl: 0 .  amphetamine-dextroamphetamine (ADDERALL) 30 MG tablet, Take 1 tablet by mouth 2 (two) times daily., Disp: 60 tablet, Rfl: 0 .  ondansetron (ZOFRAN ODT) 4 MG disintegrating tablet, Take 1 tablet (4 mg total) by mouth every 8 (eight) hours as needed for nausea or vomiting., Disp: 20 tablet, Rfl: 1 .  Oxycodone HCl 10 MG TABS, Take one tablet every 6 hours.  May refill in two months., Disp: 120 tablet, Rfl: 0 .  Oxycodone HCl 10 MG TABS, Take one tablet every 6 hours.  May refill in one month., Disp: 120 tablet, Rfl: 0 .  Oxycodone HCl 10 MG TABS, Take 1 tablet (10 mg total) by mouth 4 (four) times daily., Disp: 120 tablet, Rfl: 0 .  pregabalin (LYRICA) 150 MG capsule, Take 1 capsule (150 mg total) by mouth 3 (three) times daily., Disp: 15 capsule, Rfl: 0  EXAM:  VITALS per patient if applicable:  GENERAL: alert, oriented, appears well and in no acute distress  HEENT: atraumatic, conjunttiva clear,  no obvious abnormalities on inspection of external nose and ears  NECK: normal movements of the head and neck  LUNGS: on inspection no signs of respiratory distress, breathing rate appears normal, no obvious gross SOB, gasping or wheezing  CV: no obvious cyanosis  MS: moves all visible extremities without noticeable abnormality  PSYCH/NEURO: pleasant and cooperative, no obvious depression or anxiety, speech and thought processing grossly intact  ASSESSMENT AND PLAN:  Discussed the following assessment and plan:  Chronic anxiety.  Recent increased situational anxiety -We recommended that she discuss with  her psychiatrist other options but we have highly recommended against benzodiazepine therapy with her chronic opioid therapy for chronic pain -She will discuss specifically with her psychiatrist whether sertraline might be a good option     I discussed the assessment and treatment plan with the patient. The patient was provided an opportunity to ask questions and all were answered. The patient agreed with the plan and demonstrated an understanding of the instructions.   The patient was advised to call back or seek an in-person evaluation if the symptoms worsen or if the condition fails to improve as anticipated.  Carolann Littler, MD

## 2018-09-08 ENCOUNTER — Telehealth: Payer: Self-pay | Admitting: Psychiatry

## 2018-09-08 NOTE — Telephone Encounter (Signed)
She took some of her husband cymbalta 30 mng  2 times daily to help her get through this cov19 and not being able to see her kids. She would like you to [facility] [place]

## 2018-09-08 NOTE — Telephone Encounter (Signed)
Patient called and said that she has weened off of the paxil due to being out of town and didn't have any more. She feels great however because of covid 19 she t

## 2018-09-09 NOTE — Telephone Encounter (Signed)
Thank you.  [name] is her husband and it is fine to talk to him if you need to to reach her.

## 2018-09-09 NOTE — Telephone Encounter (Signed)
Please inform her that usually Cymbalta is not nearly as effective for anxiety as is Paxil.  How long that she been taking 60 mg of Cymbalta?  If she has not taken it for at least 4 to 6 weeks she cannot make an honest comparison between the 2 in terms of their effectiveness.  Why does she want to make the switch from Paxil?  Why not just go back to Paxil?

## 2018-09-09 NOTE — Telephone Encounter (Signed)
Left voicemail to call back, message was someone named "[name]" from number listed to call back.

## 2018-09-11 NOTE — Telephone Encounter (Signed)
Please try to reach her again to respond to my questions already noted.

## 2018-09-11 NOTE — Telephone Encounter (Signed)
Left another voicemail to call back.

## 2018-09-15 ENCOUNTER — Telehealth: Payer: Self-pay | Admitting: Psychiatry

## 2018-09-15 ENCOUNTER — Other Ambulatory Visit: Payer: Self-pay | Admitting: Psychiatry

## 2018-09-15 DIAGNOSIS — G894 Chronic pain syndrome: Secondary | ICD-10-CM

## 2018-09-16 ENCOUNTER — Other Ambulatory Visit: Payer: Self-pay | Admitting: Psychiatry

## 2018-09-16 MED ORDER — SERTRALINE HCL 100 MG PO TABS: ORAL_TABLET | ORAL | 0 refills | Status: DC

## 2018-09-16 NOTE — Telephone Encounter (Signed)
Sertraline would be a better choice with her history of anxiety.  I will will send in prescription for 100 mg tablet with instructions 1/2 tablet daily for 5 days then 1 tablet daily for 5 days then 1-1/2 tablets daily.  #45 Please let her know.

## 2018-09-16 NOTE — Telephone Encounter (Signed)
Please submit to mail order

## 2018-09-16 NOTE — Telephone Encounter (Signed)
Pt called back today, she apologized for just now calling back. Stopped Paxil due to nightmares, only took enough Cymbalta till she got back to town, took that because that what was available. Not taking currently. She says she can go back to sertraline if that's better. Also asking to have her lyrica refill submitted, this also helps with her anxiety. Scheduled visit for 04/30

## 2018-09-17 NOTE — Telephone Encounter (Signed)
Left pt. A VM to return call.

## 2018-09-17 NOTE — Telephone Encounter (Signed)
Left another VM today.

## 2018-09-22 NOTE — Telephone Encounter (Signed)
Left pt. Another VM. I have not received any call backs from this pt.

## 2018-09-22 NOTE — Telephone Encounter (Signed)
Multiple attempts to reach pt, she mentioned her phone having issues on last call. Has appt with Dr. Clovis Pu on 04/30

## 2018-09-23 NOTE — Telephone Encounter (Signed)
Error

## 2018-09-25 ENCOUNTER — Ambulatory Visit: Payer: Self-pay | Admitting: Psychiatry

## 2018-09-25 ENCOUNTER — Encounter: Payer: Self-pay | Admitting: Family Medicine

## 2018-09-26 ENCOUNTER — Encounter: Payer: Self-pay | Admitting: Family Medicine

## 2018-09-26 ENCOUNTER — Other Ambulatory Visit: Payer: Self-pay

## 2018-09-26 ENCOUNTER — Ambulatory Visit (INDEPENDENT_AMBULATORY_CARE_PROVIDER_SITE_OTHER): Payer: Self-pay | Admitting: Family Medicine

## 2018-09-26 DIAGNOSIS — G894 Chronic pain syndrome: Secondary | ICD-10-CM

## 2018-09-26 DIAGNOSIS — M797 Fibromyalgia: Secondary | ICD-10-CM

## 2018-09-26 DIAGNOSIS — M7581 Other shoulder lesions, right shoulder: Secondary | ICD-10-CM

## 2018-09-26 DIAGNOSIS — M25511 Pain in right shoulder: Secondary | ICD-10-CM

## 2018-09-26 NOTE — Progress Notes (Signed)
Patient ID: Toni Collins, female   DOB: [birth year], 53 y.o.   MRN: [id number]  This visit type was conducted due to national recommendations for restrictions regarding the COVID-19 pandemic in an effort to limit this patient's exposure and mitigate transmission in our community.   Virtual Visit via Video Note  I connected with Toni Collins on 09/26/18 at 11:45 AM EDT by a video enabled telemedicine application and verified that I am speaking with the correct person using two identifiers.  Location patient: home Location provider:work or home office Persons participating in the virtual visit: patient, provider  I discussed the limitations of evaluation and management by telemedicine and the availability of in person appointments. The patient expressed understanding and agreed to proceed.   HPI:  Chronic pain management follow-up.  Patient has chronic back pain and chronic fibromyalgia.  She has been taking opioids for several years.  She tried multiple things in the past including tricyclic antidepressants, Cymbalta, muscle relaxers, tramadol without improvement.  She is currently on Lyrica as well.  She still has some chronic pain which is not controlled.  We have offered pain management referral but patient has declined.  She is currently on oxycodone 10 mg every 6 hours.  She has had some intermittent shoulder difficulties with both shoulders.  She currently complains of some right shoulder pain.  This seemed to exacerbate after lifting up on a comforter when she felt a sharp pain to her shoulder.  She had suspected rotator cuff tendinitis and received steroid injection back in February.  Her pain seemed to improve after that until the recent injury above.  Indication for chronic opioid: Moderate back pain Medication and dose: Oxycodone 10 mg every 6 hours # pills per month: 120 Last UDS date: 11/19 Opioid Treatment Agreement signed (Y/N): y Opioid Treatment Agreement last reviewed  with patient:  yes [facility] reviewed this encounter (include red flags):  Reviewed with no red flags   ROS: See pertinent positives and negatives per HPI.  Past Medical History:  Diagnosis Date  . ADHD   . Anxiety   . Colon polyps   . DEPRESSION 09/02/2008  . Fibromyalgia   . GRIEF REACTION 09/30/2009  . INSOMNIA, CHRONIC 09/02/2008  . PREDIABETES 03/10/2009    Past Surgical History:  Procedure Laterality Date  . ABDOMINAL HYSTERECTOMY  2002   TAH  . [place], 2000   x4  . TMJ ARTHROPLASTY     x2    Family History  Problem Relation Age of Onset  . Arthritis Mother        rhematiod  . Diabetes Maternal Grandmother   . Depression Neg Hx        family    SOCIAL HX: Works for [employer].  Her job requires some lifting.  Non-smoker.  Husband disabled for medical issues   Current Outpatient Medications:  .  amphetamine-dextroamphetamine (ADDERALL) 30 MG tablet, Take 1 tablet by mouth 2 (two) times daily., Disp: 60 tablet, Rfl: 0 .  amphetamine-dextroamphetamine (ADDERALL) 30 MG tablet, Take 1 tablet by mouth 2 (two) times daily., Disp: 60 tablet, Rfl: 0 .  amphetamine-dextroamphetamine (ADDERALL) 30 MG tablet, Take 1 tablet by mouth 2 (two) times daily., Disp: 60 tablet, Rfl: 0 .  LYRICA 150 MG capsule, Take 1 capsule by mouth 3 times a day as directed by physician., Disp: 270 capsule, Rfl: 0 .  ondansetron (ZOFRAN ODT) 4 MG disintegrating tablet, Take 1 tablet (4 mg total) by mouth every  8 (eight) hours as needed for nausea or vomiting., Disp: 20 tablet, Rfl: 1 .  Oxycodone HCl 10 MG TABS, Take one tablet every 6 hours.  May refill in two months., Disp: 120 tablet, Rfl: 0 .  Oxycodone HCl 10 MG TABS, Take one tablet every 6 hours.  May refill in one month., Disp: 120 tablet, Rfl: 0 .  Oxycodone HCl 10 MG TABS, Take 1 tablet (10 mg total) by mouth 4 (four) times daily., Disp: 120 tablet, Rfl: 0 .  sertraline (ZOLOFT) 100 MG tablet, 1/2 tablet daily for 5  days then 1 tablet daily for 5 days then 1-1/2 tablets daily, Disp: 45 tablet, Rfl: 0  EXAM:  VITALS per patient if applicable:  GENERAL: alert, oriented, appears well and in no acute distress  HEENT: atraumatic, conjunttiva clear, no obvious abnormalities on inspection of external nose and ears  NECK: normal movements of the head and neck  LUNGS: on inspection no signs of respiratory distress, breathing rate appears normal, no obvious gross SOB, gasping or wheezing  CV: no obvious cyanosis  MS: moves all visible extremities without noticeable abnormality  PSYCH/NEURO: pleasant and cooperative, no obvious depression or anxiety, speech and thought processing grossly intact  ASSESSMENT AND PLAN:  Discussed the following assessment and plan:  Chronic pain syndrome/fibromyalgia-stable  -Patient will be reduced for refills of her chronic pain medicine on 09/29/2018 -Urine drug screen at next follow-up in 3 months -Continue Lyrica which she gets through psychiatry     I discussed the assessment and treatment plan with the patient. The patient was provided an opportunity to ask questions and all were answered. The patient agreed with the plan and demonstrated an understanding of the instructions.   The patient was advised to call back or seek an in-person evaluation if the symptoms worsen or if the condition fails to improve as anticipated.   Carolann Littler, MD

## 2018-09-28 ENCOUNTER — Encounter: Payer: Self-pay | Admitting: Family Medicine

## 2018-09-29 MED ORDER — OXYCODONE HCL 10 MG PO TABS: 10.0000 mg | ORAL_TABLET | Freq: Four times a day (QID) | ORAL | 0 refills | Status: DC

## 2018-09-29 MED ORDER — OXYCODONE HCL 10 MG PO TABS: ORAL_TABLET | ORAL | 0 refills | Status: DC

## 2018-09-29 NOTE — Addendum Note (Signed)
Addended by: Eulas Post on: 09/29/2018 08:03 AM   Modules accepted: Orders

## 2018-10-07 ENCOUNTER — Telehealth: Payer: Self-pay | Admitting: Psychiatry

## 2018-10-07 ENCOUNTER — Other Ambulatory Visit: Payer: Self-pay | Admitting: Psychiatry

## 2018-10-07 DIAGNOSIS — F902 Attention-deficit hyperactivity disorder, combined type: Secondary | ICD-10-CM

## 2018-10-07 MED ORDER — AMPHETAMINE-DEXTROAMPHETAMINE 30 MG PO TABS: 30.0000 mg | ORAL_TABLET | Freq: Two times a day (BID) | ORAL | 0 refills | Status: DC

## 2018-10-07 NOTE — Telephone Encounter (Signed)
Patient left vm today @9 :00 requesting a refill for Adderall to be sent to [facility]

## 2018-10-08 ENCOUNTER — Other Ambulatory Visit: Payer: Self-pay | Admitting: Psychiatry

## 2018-10-09 ENCOUNTER — Ambulatory Visit: Payer: Self-pay | Admitting: Orthopedic Surgery

## 2018-10-15 ENCOUNTER — Ambulatory Visit: Payer: Self-pay | Admitting: Orthopedic Surgery

## 2018-11-03 ENCOUNTER — Other Ambulatory Visit: Payer: Self-pay

## 2018-11-03 ENCOUNTER — Other Ambulatory Visit: Payer: Self-pay | Admitting: Psychiatry

## 2018-11-03 ENCOUNTER — Telehealth: Payer: Self-pay | Admitting: Psychiatry

## 2018-11-03 NOTE — Telephone Encounter (Signed)
New order pended for approval.

## 2018-11-03 NOTE — Progress Notes (Signed)
Patient asking for early refill and has appointment tomorrow.  No early refill.  Patient needs to keep appointment tomorrow last Adderall refill was May 12

## 2018-11-03 NOTE — Telephone Encounter (Signed)
Toni Collins called to request refill of her Adderall.  But she would like it to be 3 20mg  vs 2 30mg .  Three works better with her anxiety.  Has appt tomorrow but needs to refill today so she will have the medication for tomorrow.  Send to [place], [place]

## 2018-11-03 NOTE — Telephone Encounter (Signed)
Patient asking for early refill and has appointment tomorrow.  No early refill.  Patient needs to keep appointment tomorrow last Adderall refill was May 12

## 2018-11-04 ENCOUNTER — Encounter: Payer: Self-pay | Admitting: Psychiatry

## 2018-11-04 ENCOUNTER — Ambulatory Visit: Payer: Self-pay | Admitting: Psychiatry

## 2018-11-04 ENCOUNTER — Other Ambulatory Visit: Payer: Self-pay

## 2018-11-04 DIAGNOSIS — G894 Chronic pain syndrome: Secondary | ICD-10-CM

## 2018-11-04 DIAGNOSIS — F39 Unspecified mood [affective] disorder: Secondary | ICD-10-CM

## 2018-11-04 DIAGNOSIS — F4310 Post-traumatic stress disorder, unspecified: Secondary | ICD-10-CM

## 2018-11-04 DIAGNOSIS — F902 Attention-deficit hyperactivity disorder, combined type: Secondary | ICD-10-CM

## 2018-11-04 DIAGNOSIS — F411 Generalized anxiety disorder: Secondary | ICD-10-CM

## 2018-11-04 MED ORDER — SERTRALINE HCL 100 MG PO TABS: 200.0000 mg | ORAL_TABLET | Freq: Every day | ORAL | 1 refills | Status: DC

## 2018-11-04 MED ORDER — DIVALPROEX SODIUM 500 MG PO DR TAB: DELAYED_RELEASE_TABLET | ORAL | 1 refills | Status: DC

## 2018-11-04 MED ORDER — AMPHETAMINE-DEXTROAMPHETAMINE 20 MG PO TABS: 20.0000 mg | ORAL_TABLET | Freq: Three times a day (TID) | ORAL | 0 refills | Status: DC

## 2018-11-04 MED ORDER — DIVALPROEX SODIUM ER 500 MG PO TB24: ORAL_TABLET | ORAL | 1 refills | Status: DC
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
## 2018-11-04 NOTE — Patient Instructions (Signed)
By end of June if there is no benefit then call.

## 2018-11-12 ENCOUNTER — Telehealth: Payer: Self-pay | Admitting: Psychiatry

## 2018-11-12 NOTE — Telephone Encounter (Signed)
Pt started on new med. Husband sees new changes that he is concerned about she gets upset for no reason. Would like to talk to Dr. Clovis Pu. Pt said she has never been more depressed in her life. Please return call to [phone].

## 2018-11-13 NOTE — Telephone Encounter (Signed)
I just saw her 9 days ago.  We made to med changes at that appointment.  Neither change has had time to have any beneficial effect.  1 of the changes was to increase her antidepressant.  As of the phone call last night she could only have been on the new medication Depakote at the prescribed dose for 1 day.  That is not long enough to see any benefit.  We also only increased her antidepressant 8 days ago.  That also was not long enough to see benefit.  She could see some benefit from the new medication starting the end of next week.  If she does not see any benefit by then we can work her in to an appointment next Friday afternoon.

## 2018-11-13 NOTE — Telephone Encounter (Signed)
Left voicemail on husband's cell to call back

## 2018-11-13 NOTE — Telephone Encounter (Signed)
Tried to reach husband again but no answer.

## 2018-11-14 NOTE — Telephone Encounter (Signed)
Left another voicemail to call back.

## 2018-11-14 NOTE — Telephone Encounter (Signed)
[name], husband called back and given information, he agreed and instructed to call back if no improvement end of next week.

## 2018-11-17 ENCOUNTER — Other Ambulatory Visit: Payer: Self-pay | Admitting: Family Medicine

## 2018-11-17 NOTE — Telephone Encounter (Signed)
May refill once. 

## 2018-11-17 NOTE — Telephone Encounter (Signed)
OK to continue this medication? 

## 2018-11-19 ENCOUNTER — Telehealth: Payer: Self-pay | Admitting: Psychiatry

## 2018-11-19 NOTE — Telephone Encounter (Signed)
Pt stated the med is helping but would like to decrease the 500 to 250. Please call so she can discuss the Lithium draw.

## 2018-11-20 NOTE — Telephone Encounter (Signed)
I do not believe that the Depakote is making her agitated.  That is not what Depakote does.  She is not taking enough of it to do any good.  She suspects that is causing problems and her husband does as well.  I do not believe that is the case I believe the problem is that her underlying bipolar additional disorder is not being treated.  However the only way to prove this to her is to just stop the Depakote.  250 or 500 mg as an inadequate dose and will not do any good anyway.  Tell her to stop the Depakote and we will reevaluate how her mood progresses without the medication.  She believes she is having side effects to the Depakote.  There is no need to do any blood tests at this time.

## 2018-11-20 NOTE — Telephone Encounter (Signed)
Left voice mail to call back 

## 2018-11-21 ENCOUNTER — Encounter: Payer: Self-pay | Admitting: Family Medicine

## 2018-11-24 ENCOUNTER — Encounter: Payer: Self-pay | Admitting: Family Medicine

## 2018-11-24 ENCOUNTER — Other Ambulatory Visit: Payer: Self-pay

## 2018-11-24 ENCOUNTER — Ambulatory Visit (INDEPENDENT_AMBULATORY_CARE_PROVIDER_SITE_OTHER): Payer: Self-pay | Admitting: Family Medicine

## 2018-11-24 DIAGNOSIS — M25511 Pain in right shoulder: Secondary | ICD-10-CM

## 2018-11-24 NOTE — Progress Notes (Signed)
Patient ID: Toni Collins, female   DOB: 1965-08-15, 53 y.o.   MRN: [id number]  This visit type was conducted due to national recommendations for restrictions regarding the COVID-19 pandemic in an effort to limit this patient's exposure and mitigate transmission in our community.   Virtual Visit via Video Note  I connected with Toni Collins on 11/24/18 at  3:30 PM EDT by a video enabled telemedicine application and verified that I am speaking with the correct person using two identifiers.  Location patient: home Location provider:work or home office Persons participating in the virtual visit: patient, provider  I discussed the limitations of evaluation and management by telemedicine and the availability of in person appointments. The patient expressed understanding and agreed to proceed.   HPI: Patient has history of chronic pain syndrome and fibromyalgia.  She has had some chronic back pain for years.  She complains of bilateral shoulder pain right greater than left.  Denies any specific injury.  Her pain is worse when reaching overhead.  She already takes chronic opioid therapy but that is not controlling her shoulder pain.  Denies any radiculitis symptoms.  No definite weakness.  No upper extremity numbness.  Pain is progressive and waking her some at night.  She thinks her left shoulder started hurting as she has used it more to compensate for her right shoulder issues   ROS: See pertinent positives and negatives per HPI.  Past Medical History:  Diagnosis Date  . ADHD   . Anxiety   . Colon polyps   . DEPRESSION 09/02/2008  . Fibromyalgia   . GRIEF REACTION 09/30/2009  . INSOMNIA, CHRONIC 09/02/2008  . PREDIABETES 03/10/2009    Past Surgical History:  Procedure Laterality Date  . ABDOMINAL HYSTERECTOMY  2002   TAH  . [place], 2000   x4  . TMJ ARTHROPLASTY     x2    Family History  Problem Relation Age of Onset  . Arthritis Mother         rhematiod  . Diabetes Maternal Grandmother   . Depression Neg Hx        family    SOCIAL HX: Non-smoker.  No alcohol.   Current Outpatient Medications:  .  amphetamine-dextroamphetamine (ADDERALL) 20 MG tablet, Take 1 tablet (20 mg total) by mouth 3 (three) times daily., Disp: 90 tablet, Rfl: 0 .  [START ON 12/02/2018] amphetamine-dextroamphetamine (ADDERALL) 20 MG tablet, Take 1 tablet (20 mg total) by mouth 3 (three) times daily., Disp: 60 tablet, Rfl: 0 .  divalproex (DEPAKOTE) 500 MG DR tablet, 1 at night for 1 week then 1 twice daily., Disp: 60 tablet, Rfl: 1 .  LYRICA 150 MG capsule, Take 1 capsule by mouth 3 times a day as directed by physician., Disp: 270 capsule, Rfl: 0 .  ondansetron (ZOFRAN-ODT) 4 MG disintegrating tablet, DISSOLVE/TAKE 1 TABLET BY MOUTH EVERY 8 HOURS AS NEEDED FOR NAUSEA AND VOMITING, Disp: 20 tablet, Rfl: 1 .  Oxycodone HCl 10 MG TABS, Take one tablet every 6 hours.  May refill in two months., Disp: 120 tablet, Rfl: 0 .  Oxycodone HCl 10 MG TABS, Take one tablet every 6 hours.  May refill in one month., Disp: 120 tablet, Rfl: 0 .  Oxycodone HCl 10 MG TABS, Take 1 tablet (10 mg total) by mouth 4 (four) times daily., Disp: 120 tablet, Rfl: 0 .  sertraline (ZOLOFT) 100 MG tablet, Take 2 tablets (200 mg total) by mouth daily., Disp: 60 tablet,  Rfl: 1  EXAM:  VITALS per patient if applicable:  GENERAL: alert, oriented, appears well and in no acute distress  HEENT: atraumatic, conjunttiva clear, no obvious abnormalities on inspection of external nose and ears  NECK: normal movements of the head and neck  LUNGS: on inspection no signs of respiratory distress, breathing rate appears normal, no obvious gross SOB, gasping or wheezing  CV: no obvious cyanosis  MS: moves all visible extremities without noticeable abnormality  PSYCH/NEURO: pleasant and cooperative, no obvious depression or anxiety, speech and thought processing grossly intact  ASSESSMENT AND  PLAN:  Discussed the following assessment and plan:  Progressive bilateral shoulder pain right greater than left  -Discussed possible sports medicine referral and patient would like to proceed     I discussed the assessment and treatment plan with the patient. The patient was provided an opportunity to ask questions and all were answered. The patient agreed with the plan and demonstrated an understanding of the instructions.   The patient was advised to call back or seek an in-person evaluation if the symptoms worsen or if the condition fails to improve as anticipated.   Carolann Littler, MD

## 2018-11-25 ENCOUNTER — Telehealth: Payer: Self-pay | Admitting: Family Medicine

## 2018-11-25 NOTE — Telephone Encounter (Signed)
Left voice mail to call back 

## 2018-11-25 NOTE — Telephone Encounter (Signed)
FYI for Dr. Elease Hashimoto.

## 2018-11-25 NOTE — Telephone Encounter (Signed)
Patient is scheduled 7/9.  Would like to get worked in sooner if possible.

## 2018-11-25 NOTE — Telephone Encounter (Signed)
Okay to change Lyrica to 75 mg 4 times daily

## 2018-11-26 ENCOUNTER — Other Ambulatory Visit: Payer: Self-pay

## 2018-11-26 ENCOUNTER — Ambulatory Visit: Payer: Self-pay | Admitting: Family Medicine

## 2018-11-26 DIAGNOSIS — G894 Chronic pain syndrome: Secondary | ICD-10-CM

## 2018-11-26 MED ORDER — PREGABALIN 75 MG PO CAPS: ORAL_CAPSULE | ORAL | 1 refills | Status: DC

## 2018-11-26 NOTE — Telephone Encounter (Signed)
Pended for approval.

## 2018-11-26 NOTE — Telephone Encounter (Signed)
Pt receives medication through [employer] for Lyrica 75 mg qid

## 2018-11-27 ENCOUNTER — Telehealth: Payer: Self-pay | Admitting: Psychiatry

## 2018-11-27 NOTE — Telephone Encounter (Signed)
Patient called and said that she wants a two week [facility] of [facility] [place] due to sending off the renewal paperwork. She will dropping the paperwork off today

## 2018-11-28 ENCOUNTER — Other Ambulatory Visit: Payer: Self-pay

## 2018-11-28 DIAGNOSIS — G894 Chronic pain syndrome: Secondary | ICD-10-CM

## 2018-11-28 MED ORDER — PREGABALIN 75 MG PO CAPS: ORAL_CAPSULE | ORAL | 0 refills | Status: DC

## 2018-11-28 NOTE — Telephone Encounter (Signed)
Refill sent for 2 weeks

## 2018-12-01 ENCOUNTER — Telehealth: Payer: Self-pay | Admitting: Psychiatry

## 2018-12-01 ENCOUNTER — Other Ambulatory Visit: Payer: Self-pay

## 2018-12-01 DIAGNOSIS — G894 Chronic pain syndrome: Secondary | ICD-10-CM

## 2018-12-01 MED ORDER — PREGABALIN 75 MG PO CAPS: ORAL_CAPSULE | ORAL | 0 refills | Status: DC

## 2018-12-01 NOTE — Telephone Encounter (Signed)
Submitted to [facility] on [address] in [place].

## 2018-12-01 NOTE — Telephone Encounter (Signed)
Pt called in Friday about [facility] The price there is too high, so she wanted to see if she can get it called in at [facility] in [place] on [address]

## 2018-12-02 ENCOUNTER — Other Ambulatory Visit: Payer: Self-pay | Admitting: Physician Assistant

## 2018-12-02 ENCOUNTER — Other Ambulatory Visit: Payer: Self-pay

## 2018-12-02 ENCOUNTER — Telehealth: Payer: Self-pay | Admitting: Psychiatry

## 2018-12-02 DIAGNOSIS — F902 Attention-deficit hyperactivity disorder, combined type: Secondary | ICD-10-CM

## 2018-12-02 MED ORDER — AMPHETAMINE-DEXTROAMPHETAMINE 20 MG PO TABS: 20.0000 mg | ORAL_TABLET | Freq: Three times a day (TID) | ORAL | 0 refills | Status: DC

## 2018-12-02 NOTE — Progress Notes (Signed)
Adderall Rx was originally sent in for #60.  However patient takes it 3 times a day.  Then I sent Rx in for #90.  Then I spoke with patient who said [facility] would [facility] due to cost.  I then [facility].

## 2018-12-02 NOTE — Telephone Encounter (Signed)
Clarification provider submitted #60 today previous month was #90. Takes tid. Doesn't usually get 90 day but will check with provider for approval.

## 2018-12-02 NOTE — Telephone Encounter (Signed)
She should have a 90 day on file that was submitted on 06/09

## 2018-12-02 NOTE — Telephone Encounter (Signed)
We have sent in Toni Collins's renewal application for Pt[employer] Assistance for her Lyrica.  But they need a new prescription sent in as well.  It can be escribed to [facility] which is listed as one of her pharmacies.  The prescription will need to be for the brand lyrica since it is coming from [employer].  I thought this had been done, but I don't see it in the chart.  I only see the [facility] local [facility] to [facility] until the Pt[employer] Assistance arrives.  Anyway, please send in the prescription to the Pt[employer] pharmacy so they can process the renewal.

## 2018-12-02 NOTE — Telephone Encounter (Signed)
Adderall rx pended for #90 sent to [facility] for approval to [facility] pharmacy

## 2018-12-02 NOTE — Telephone Encounter (Signed)
Patient would like for you to contact her regarding her medication.  When the Adderall was called in Dr. Clovis Pu only called in 1 month instead of 3 months patient would like for you to call into pharmacy [facility] in [place]) by 4 pm. Patient only has enough for today

## 2018-12-03 ENCOUNTER — Other Ambulatory Visit: Payer: Self-pay

## 2018-12-03 DIAGNOSIS — G894 Chronic pain syndrome: Secondary | ICD-10-CM

## 2018-12-03 MED ORDER — LYRICA 75 MG PO CAPS: 75.0000 mg | ORAL_CAPSULE | Freq: Two times a day (BID) | ORAL | 3 refills | Status: DC

## 2018-12-03 NOTE — Telephone Encounter (Signed)
done

## 2018-12-03 NOTE — Telephone Encounter (Signed)
This is for patient assistance, brand name needs to be sent for lyrica

## 2018-12-03 NOTE — Progress Notes (Signed)
Updated rx for Lyrica 75 mg qid

## 2018-12-04 ENCOUNTER — Other Ambulatory Visit: Payer: Self-pay

## 2018-12-04 ENCOUNTER — Encounter

## 2018-12-04 ENCOUNTER — Ambulatory Visit (INDEPENDENT_AMBULATORY_CARE_PROVIDER_SITE_OTHER): Payer: Self-pay | Admitting: Family Medicine

## 2018-12-04 VITALS — BP 108/74 | HR 84 | Ht 62.0 in | Wt 147.0 lb

## 2018-12-04 DIAGNOSIS — M7552 Bursitis of left shoulder: Secondary | ICD-10-CM

## 2018-12-04 DIAGNOSIS — M25512 Pain in left shoulder: Secondary | ICD-10-CM

## 2018-12-04 DIAGNOSIS — M7551 Bursitis of right shoulder: Secondary | ICD-10-CM | POA: Insufficient documentation

## 2018-12-04 MED ORDER — GABAPENTIN 100 MG PO CAPS: 200.0000 mg | ORAL_CAPSULE | Freq: Every day | ORAL | 0 refills | Status: DC

## 2018-12-04 MED ORDER — DICLOFENAC SODIUM 2 % TD SOLN: 2.0000 g | Freq: Two times a day (BID) | TRANSDERMAL | 3 refills | Status: DC

## 2018-12-04 NOTE — Patient Instructions (Signed)
Exercises 3x a week Injected both shoulders Use pennsaid 2x daily fingertip sized amount Gabapentin at night See me again 6 weeks

## 2018-12-04 NOTE — Progress Notes (Signed)
[facility] [address], [place] [address] Phone: [phone] Subjective:   Fontaine No, am serving as a scribe for Dr. Hulan Saas.  I'm seeing this patient by the request  of:  Eulas Post, MD   CC: right shoulder pain   WER:[id number]  Toni Collins is a 53 y.o. female coming in with complaint of bilateral shoulder pain. Pain in anterior shoulder. Unable to move shoulder overhead. Is using oxycodone 4x a day. Biofreeze topically. Denies any radiating symptoms. Patient was working at [employer] but is no longer working there. Does take care of horses at a stable.  Patient is on chronic narcotics.  Patient states that it is affecting daily activities.      Past Medical History:  Diagnosis Date  . ADHD   . Anxiety   . Colon polyps   . DEPRESSION 09/02/2008  . Fibromyalgia   . GRIEF REACTION 09/30/2009  . INSOMNIA, CHRONIC 09/02/2008  . PREDIABETES 03/10/2009   Past Surgical History:  Procedure Laterality Date  . ABDOMINAL HYSTERECTOMY  2002   TAH  . [place], 2000   x4  . TMJ ARTHROPLASTY     x2   Social History   Socioeconomic History  . Marital status: Married    Spouse name: Not on file  . Number of children: Not on file  . Years of education: Not on file  . Highest education level: Not on file  Occupational History  . Not on file  Social Needs  . Financial resource strain: Not on file  . Food insecurity    Worry: Not on file    Inability: Not on file  . Transportation needs    Medical: Not on file    Non-medical: Not on file  Tobacco Use  . Smoking status: Never Smoker  . Smokeless tobacco: Never Used  Substance and Sexual Activity  . Alcohol use: No  . Drug use: Not Currently  . Sexual activity: Not on file  Lifestyle  . Physical activity    Days per week: Not on file    Minutes per session: Not on file  . Stress: Not on file  Relationships  . Social Clinical research associate on phone: Not on file    Gets together: Not on file    Attends religious service: Not on file    Active member of club or organization: Not on file    Attends meetings of clubs or organizations: Not on file    Relationship status: Not on file  Other Topics Concern  . Not on file  Social History Narrative  . Not on file   No Known Allergies Family History  Problem Relation Age of Onset  . Arthritis Mother        rhematiod  . Diabetes Maternal Grandmother   . Depression Neg Hx        family       Current Outpatient Medications (Analgesics):  Marland Kitchen  Oxycodone HCl 10 MG TABS, Take one tablet every 6 hours.  May refill in two months. .  Oxycodone HCl 10 MG TABS, Take one tablet every 6 hours.  May refill in one month. .  Oxycodone HCl 10 MG TABS, Take 1 tablet (10 mg total) by mouth 4 (four) times daily.   Current Outpatient Medications (Other):  .  amphetamine-dextroamphetamine (ADDERALL) 20 MG tablet, Take 1 tablet (20 mg total) by mouth 3 (three) times daily. Marland Kitchen  ondansetron (ZOFRAN-ODT) 4 MG disintegrating tablet, DISSOLVE/TAKE 1 TABLET BY MOUTH EVERY 8 HOURS AS NEEDED FOR NAUSEA AND VOMITING .  pregabalin (LYRICA) 75 MG capsule, Take 1 capsule by mouth 4 times a day as directed by physician. .  sertraline (ZOLOFT) 100 MG tablet, Take 2 tablets (200 mg total) by mouth daily. .  Diclofenac Sodium 2 % SOLN, Place 2 g onto the skin 2 (two) times daily. .  divalproex (DEPAKOTE) 500 MG DR tablet, 1 at night for 1 week then 1 twice daily. Marland Kitchen  gabapentin (NEURONTIN) 100 MG capsule, Take 2 capsules (200 mg total) by mouth at bedtime.    Past medical history, social, surgical and family history all reviewed in electronic medical record.  No pertanent information unless stated regarding to the chief complaint.   Review of Systems:  No headache, visual changes, nausea, vomiting, diarrhea, constipation, dizziness, abdominal pain, skin rash, fevers, chills, night sweats, weight loss,  swollen lymph nodes, body aches, joint swelling,  chest pain, shortness of breath, mood changes.  Positive muscle aches  Objective  Blood pressure 108/74, pulse 84, height 5\' 2"  (1.575 m), weight 147 lb (66.7 kg), SpO2 96 %.     General: No apparent distress alert and oriented x3 mood and affect normal, dressed appropriately.  HEENT: Pupils equal, extraocular movements intact pinpoint pupils noted Respiratory: Patient's speak in full sentences and does not appear short of breath  Cardiovascular: No lower extremity edema, non tender, no erythema  Skin: Warm dry intact with no signs of infection or rash on extremities or on axial skeleton.  Abdomen: Soft nontender  Neuro: Cranial nerves II through XII are intact, neurovascularly intact in all extremities with 2+ DTRs and 2+ pulses.  Lymph: No lymphadenopathy of posterior or anterior cervical chain or axillae bilaterally.  Gait normal with good balance and coordination.  MSK:  Non tender with full range of motion and good stability and symmetric strength and tone of  elbows, wrist, hip, knee and ankles bilaterally.  Shoulder: Bilateral Inspection reveals no abnormalities, atrophy or asymmetry. Palpation diffuse tenderness bilaterally even to light palpation ROM is full in all planes. Rotator cuff strength \\4 + out of 5 compared to the contralateral side Positive impingement bilaterally Speeds and Yergason's tests normal. No labral pathology noted with negative Obrien's, negative clunk and good stability. Normal scapular function observed. No painful arc and no drop arm sign. No apprehension sign  After informed written and verbal consent, patient was seated on exam table. Right shoulder was prepped with alcohol swab and utilizing posterior approach, patient's right glenohumeral space was injected with 4:1  marcaine 0.5%: Kenalog 40mg /dL. Patient tolerated the procedure well without immediate complications.  After informed written and verbal  consent, patient was seated on exam table. Left shoulder was prepped with alcohol swab and utilizing posterior approach, patient's right glenohumeral space was injected with 4:1  marcaine 0.5%: Kenalog 40mg /dL. Patient tolerated the procedure well without immediate complications.      Impression and Recommendations:     This case required medical decision making of moderate complexity. The above documentation has been reviewed and is accurate and complete Lyndal Pulley, DO       Note: This dictation was prepared with Dragon dictation along with smaller phrase technology. Any transcriptional errors that result from this process are unintentional.

## 2018-12-04 NOTE — Assessment & Plan Note (Signed)
Lateral injections given today.  Discussed icing regimen and home exercises,.  Patient is already on Lyrica for low dose and gabapentin at night.  I am hoping that I will be beneficial.  Discussed icing regimen, which activities to do which avoid.  If continuing to have pain I would like to consider for future consideration for formal physical therapy and potentially laboratory work-up.  Patient is in agreement with the plan.  We will continue to get her oxycodone from her primary care provider

## 2018-12-05 ENCOUNTER — Encounter: Payer: Self-pay | Admitting: Family Medicine

## 2018-12-12 ENCOUNTER — Telehealth: Payer: Self-pay | Admitting: Psychiatry

## 2018-12-12 NOTE — Telephone Encounter (Signed)
Toni Collins called to ask questions about her Lyrica.  She has been trying to reduce dose and thinks maybe she did too much too fast.  Please call to discuss.  Appt 12/30/18

## 2018-12-12 NOTE — Telephone Encounter (Signed)
Pt was trying to reduce her dose of Lyrica from 150 mg tid down to 75 mg qid on her own she mentioned, now asking if she can go back up to 100 mg tid or qid whichever you recommend. Pt aware provider out of town and won't be addressed until next week. Pt does receive medication through patient assistance so a new rx would have to be submitted as well.

## 2018-12-15 NOTE — Telephone Encounter (Signed)
So she reduced from 450 mg a day at 150 mg 3 times daily of Lyrica to 300 mg daily 75 mg 4 times daily.  That would be equivalent to 100 mg 3 times daily.  Apparently she is experiencing some sort of problem from the reduction.  What is the problem she is experiencing?

## 2018-12-16 ENCOUNTER — Other Ambulatory Visit: Payer: Self-pay | Admitting: Psychiatry

## 2018-12-19 NOTE — Telephone Encounter (Signed)
Please review

## 2018-12-21 ENCOUNTER — Encounter: Payer: Self-pay | Admitting: Family Medicine

## 2018-12-23 ENCOUNTER — Encounter: Payer: Self-pay | Admitting: Family Medicine

## 2018-12-24 ENCOUNTER — Telehealth: Payer: Self-pay | Admitting: Family Medicine

## 2018-12-24 NOTE — Telephone Encounter (Signed)
Pt request for appt through MyChart.  No return call as patient made her own appt.

## 2018-12-25 ENCOUNTER — Encounter: Payer: Self-pay | Admitting: Family Medicine

## 2018-12-26 ENCOUNTER — Encounter: Payer: Self-pay | Admitting: Family Medicine

## 2018-12-26 ENCOUNTER — Ambulatory Visit (INDEPENDENT_AMBULATORY_CARE_PROVIDER_SITE_OTHER): Payer: Self-pay | Admitting: Family Medicine

## 2018-12-26 ENCOUNTER — Other Ambulatory Visit: Payer: Self-pay

## 2018-12-26 DIAGNOSIS — G894 Chronic pain syndrome: Secondary | ICD-10-CM

## 2018-12-26 DIAGNOSIS — M797 Fibromyalgia: Secondary | ICD-10-CM

## 2018-12-26 MED ORDER — OXYCODONE HCL 10 MG PO TABS: 10.0000 mg | ORAL_TABLET | Freq: Four times a day (QID) | ORAL | 0 refills | Status: DC

## 2018-12-26 MED ORDER — OXYCODONE HCL 10 MG PO TABS: ORAL_TABLET | ORAL | 0 refills | Status: DC

## 2018-12-26 NOTE — Progress Notes (Signed)
Patient ID: Toni Collins, female   DOB: [birth year], 53 y.o.   MRN: [id number]   This visit type was conducted due to national recommendations for restrictions regarding the COVID-19 pandemic in an effort to limit this patient's exposure and mitigate transmission in our community.   Virtual Visit via Video Note  I connected with Toni Collins on 12/26/18 at  9:45 AM EDT by a video enabled telemedicine application and verified that I am speaking with the correct person using two identifiers.  Location patient: home Location provider:work or home office Persons participating in the virtual visit: patient, provider  I discussed the limitations of evaluation and management by telemedicine and the availability of in person appointments. The patient expressed understanding and agreed to proceed.   HPI:  Follow up for chronic pain management follow up Her psychiatrist was able to lower her Lyrica dose.   Indication for chronic opioid: chronic back and fibromyalgia pain Medication and dose: Oxycodone 10 mg qid # pills per month: 120 Last UDS date: 04/04/18 Opioid Treatment Agreement signed (Y/N): Y Opioid Treatment Agreement last reviewed with patient:   NCCSRS reviewed this encounter (include red flags): Yes No red flags.   ROS: See pertinent positives and negatives per HPI.  Past Medical History:  Diagnosis Date  . ADHD   . Anxiety   . Colon polyps   . DEPRESSION 09/02/2008  . Fibromyalgia   . GRIEF REACTION 09/30/2009  . INSOMNIA, CHRONIC 09/02/2008  . PREDIABETES 03/10/2009    Past Surgical History:  Procedure Laterality Date  . ABDOMINAL HYSTERECTOMY  2002   TAH  . [place], 2000   x4  . TMJ ARTHROPLASTY     x2    Family History  Problem Relation Age of Onset  . Arthritis Mother        rhematiod  . Diabetes Maternal Grandmother   . Depression Neg Hx        family    SOCIAL HX: non-smoker.  Lives with husband.  Two  children.   Current Outpatient Medications:  .  amphetamine-dextroamphetamine (ADDERALL) 20 MG tablet, Take 1 tablet (20 mg total) by mouth 3 (three) times daily., Disp: 90 tablet, Rfl: 0 .  Diclofenac Sodium 2 % SOLN, Place 2 g onto the skin 2 (two) times daily., Disp: 112 g, Rfl: 3 .  divalproex (DEPAKOTE) 500 MG DR tablet, 1 at night for 1 week then 1 twice daily., Disp: 60 tablet, Rfl: 1 .  ondansetron (ZOFRAN-ODT) 4 MG disintegrating tablet, DISSOLVE/TAKE 1 TABLET BY MOUTH EVERY 8 HOURS AS NEEDED FOR NAUSEA AND VOMITING, Disp: 20 tablet, Rfl: 1 .  Oxycodone HCl 10 MG TABS, Take one tablet every 6 hours.  May refill in two months., Disp: 120 tablet, Rfl: 0 .  Oxycodone HCl 10 MG TABS, Take one tablet every 6 hours.  May refill in one month., Disp: 120 tablet, Rfl: 0 .  Oxycodone HCl 10 MG TABS, Take 1 tablet (10 mg total) by mouth 4 (four) times daily., Disp: 120 tablet, Rfl: 0 .  pregabalin (LYRICA) 75 MG capsule, Take 1 capsule by mouth 4 times a day as directed by physician., Disp: 56 capsule, Rfl: 0 .  sertraline (ZOLOFT) 100 MG tablet, Take 2 tablets (200 mg total) by mouth daily., Disp: 60 tablet, Rfl: 1  EXAM:  VITALS per patient if applicable:  GENERAL: alert, oriented, appears well and in no acute distress  HEENT: atraumatic, conjunttiva clear, no obvious abnormalities on inspection  of external nose and ears  NECK: normal movements of the head and neck  LUNGS: on inspection no signs of respiratory distress, breathing rate appears normal, no obvious gross SOB, gasping or wheezing  CV: no obvious cyanosis  MS: moves all visible extremities without noticeable abnormality  PSYCH/NEURO: pleasant and cooperative, no obvious depression or anxiety, speech and thought processing grossly intact  ASSESSMENT AND PLAN:  Discussed the following assessment and plan:  Chronic pain syndrome- stable  -refill Oxycodone for 3 months - will need drug screen at next follow up.     I  discussed the assessment and treatment plan with the patient. The patient was provided an opportunity to ask questions and all were answered. The patient agreed with the plan and demonstrated an understanding of the instructions.   The patient was advised to call back or seek an in-person evaluation if the symptoms worsen or if the condition fails to improve as anticipated.   Carolann Littler, MD

## 2018-12-30 ENCOUNTER — Encounter: Payer: Self-pay | Admitting: Psychiatry

## 2018-12-30 ENCOUNTER — Ambulatory Visit (INDEPENDENT_AMBULATORY_CARE_PROVIDER_SITE_OTHER): Payer: Self-pay | Admitting: Psychiatry

## 2018-12-30 ENCOUNTER — Other Ambulatory Visit: Payer: Self-pay

## 2018-12-30 VITALS — BP 128/82 | HR 79

## 2018-12-30 DIAGNOSIS — G894 Chronic pain syndrome: Secondary | ICD-10-CM

## 2018-12-30 DIAGNOSIS — F902 Attention-deficit hyperactivity disorder, combined type: Secondary | ICD-10-CM

## 2018-12-30 DIAGNOSIS — F4310 Post-traumatic stress disorder, unspecified: Secondary | ICD-10-CM

## 2018-12-30 DIAGNOSIS — F411 Generalized anxiety disorder: Secondary | ICD-10-CM

## 2018-12-30 DIAGNOSIS — F39 Unspecified mood [affective] disorder: Secondary | ICD-10-CM

## 2018-12-30 NOTE — Telephone Encounter (Signed)
Discussed at visit and updated
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
## 2018-12-31 ENCOUNTER — Other Ambulatory Visit: Payer: Self-pay

## 2018-12-31 DIAGNOSIS — G894 Chronic pain syndrome: Secondary | ICD-10-CM

## 2018-12-31 MED ORDER — PREGABALIN 150 MG PO CAPS: 150.0000 mg | ORAL_CAPSULE | Freq: Three times a day (TID) | ORAL | 1 refills | Status: DC

## 2019-01-08 ENCOUNTER — Telehealth: Payer: Self-pay | Admitting: Psychiatry

## 2019-01-08 ENCOUNTER — Other Ambulatory Visit: Payer: Self-pay

## 2019-01-08 DIAGNOSIS — F902 Attention-deficit hyperactivity disorder, combined type: Secondary | ICD-10-CM

## 2019-01-08 MED ORDER — AMPHETAMINE-DEXTROAMPHETAMINE 20 MG PO TABS: 20.0000 mg | ORAL_TABLET | Freq: Three times a day (TID) | ORAL | 0 refills | Status: DC

## 2019-01-08 NOTE — Telephone Encounter (Signed)
I already submitted the lyrica request with her new dosage, I believe last week. I can do her Adderall Rx.

## 2019-01-08 NOTE — Telephone Encounter (Signed)
Pt called to talk to nurse about updates on meds. Ask nurse to return call @ [phone]

## 2019-01-08 NOTE — Telephone Encounter (Signed)
Talked with Toni Collins today.  She said she is ok with her Lyrica right now.  Probably has about 2 more weeks.  She was thinking you could wait a week before requesting the refill.  I told her it is best if we get it in because they have to process it and get it mailed.  It is better to have it sooner rather than later.  She also ask for a refill of her Adderall.  Send to [place].

## 2019-01-09 ENCOUNTER — Telehealth: Payer: Self-pay | Admitting: Psychiatry

## 2019-01-09 NOTE — Telephone Encounter (Signed)
Pt left v-mail asking for Adderall Rx to be sent over as 30 mg 2/d. Less expensive this way.

## 2019-01-09 NOTE — Telephone Encounter (Signed)
Patient stated Adderall refill [facility] yesterday and they stated that Dr. Casimiro Needle License number is saying it's expired

## 2019-01-09 NOTE — Telephone Encounter (Signed)
Toni Collins called again to say that since the pharmacy hasn't filled the Adderall RX can it be changed to the 30mg  2/d since it will be less expensive.  I explained to her that the record was showing that she [facility] and [facility] on [facility]18 at [facility] and that she was not going to be able to get anymore.  She said she thought the Rx at [facility] had been cancelled and she never picked it up.  She was going to call [facility] and see if they still had it and ask them to take it out as being picked up since she didn't pick it up.  She will also verify with her husband that he didn't pick it up.  If he did they will have it in their lock box.  She'll let us know what she finds out.  She doesn't want Dr. Clovis Pu to think that she is abusing her medications.

## 2019-01-15 ENCOUNTER — Ambulatory Visit: Payer: Self-pay | Admitting: Family Medicine

## 2019-01-15 DIAGNOSIS — Z0289 Encounter for other administrative examinations: Secondary | ICD-10-CM

## 2019-01-15 NOTE — Progress Notes (Deleted)
[facility] [address], [place] [address] Phone: [phone] Subjective:    I'm seeing this patient by the request  of:    CC:   [id number]   12/04/2018 Lateral injections given today.  Discussed icing regimen and home exercises,.  Patient is already on Lyrica for low dose and gabapentin at night.  I am hoping that I will be beneficial.  Discussed icing regimen, which activities to do which avoid.  If continuing to have pain I would like to consider for future consideration for formal physical therapy and potentially laboratory work-up.  Patient is in agreement with the plan.  We will continue to get her oxycodone from her primary care provider  Update 01/15/2019 Toni Collins is a 53 y.o. female coming in with complaint of bilateral shoulder pain. Patient states   Past Medical History:  Diagnosis Date  . ADHD   . Anxiety   . Colon polyps   . DEPRESSION 09/02/2008  . Fibromyalgia   . GRIEF REACTION 09/30/2009  . INSOMNIA, CHRONIC 09/02/2008  . PREDIABETES 03/10/2009   Past Surgical History:  Procedure Laterality Date  . ABDOMINAL HYSTERECTOMY  2002   TAH  . [place], 2000   x4  . TMJ ARTHROPLASTY     x2   Social History   Socioeconomic History  . Marital status: Married    Spouse name: Not on file  . Number of children: Not on file  . Years of education: Not on file  . Highest education level: Not on file  Occupational History  . Not on file  Social Needs  . Financial resource strain: Not on file  . Food insecurity    Worry: Not on file    Inability: Not on file  . Transportation needs    Medical: Not on file    Non-medical: Not on file  Tobacco Use  . Smoking status: Never Smoker  . Smokeless tobacco: Never Used  Substance and Sexual Activity  . Alcohol use: No  . Drug use: Not Currently  . Sexual activity: Not on file  Lifestyle  . Physical activity    Days per week: Not on file     Minutes per session: Not on file  . Stress: Not on file  Relationships  . Social Herbalist on phone: Not on file    Gets together: Not on file    Attends religious service: Not on file    Active member of club or organization: Not on file    Attends meetings of clubs or organizations: Not on file    Relationship status: Not on file  Other Topics Concern  . Not on file  Social History Narrative  . Not on file   No Known Allergies Family History  Problem Relation Age of Onset  . Arthritis Mother        rhematiod  . Diabetes Maternal Grandmother   . Depression Neg Hx        family       Current Outpatient Medications (Analgesics):  Marland Kitchen  Oxycodone HCl 10 MG TABS, Take one tablet every 6 hours.  May refill in one month. .  Oxycodone HCl 10 MG TABS, Take 1 tablet (10 mg total) by mouth 4 (four) times daily. .  Oxycodone HCl 10 MG TABS, Take one tablet every 6 hours.  May refill in two months.   Current Outpatient Medications (Other):  .  amphetamine-dextroamphetamine (ADDERALL) 20 MG tablet, Take 1 tablet (20 mg total) by mouth 3 (three) times daily. .  Diclofenac Sodium 2 % SOLN, Place 2 g onto the skin 2 (two) times daily. .  ondansetron (ZOFRAN-ODT) 4 MG disintegrating tablet, DISSOLVE/TAKE 1 TABLET BY MOUTH EVERY 8 HOURS AS NEEDED FOR NAUSEA AND VOMITING .  pregabalin (LYRICA) 150 MG capsule, Take 1 capsule (150 mg total) by mouth 3 (three) times daily. .  sertraline (ZOLOFT) 100 MG tablet, Take 2 tablets (200 mg total) by mouth daily.    Past medical history, social, surgical and family history all reviewed in electronic medical record.  No pertanent information unless stated regarding to the chief complaint.   Review of Systems:  No headache, visual changes, nausea, vomiting, diarrhea, constipation, dizziness, abdominal pain, skin rash, fevers, chills, night sweats, weight loss, swollen lymph nodes, body aches, joint swelling, muscle aches, chest pain,  shortness of breath, mood changes.   Objective  There were no vitals taken for this visit. Systems examined below as of    General: No apparent distress alert and oriented x3 mood and affect normal, dressed appropriately.  HEENT: Pupils equal, extraocular movements intact  Respiratory: Patient's speak in full sentences and does not appear short of breath  Cardiovascular: No lower extremity edema, non tender, no erythema  Skin: Warm dry intact with no signs of infection or rash on extremities or on axial skeleton.  Abdomen: Soft nontender  Neuro: Cranial nerves II through XII are intact, neurovascularly intact in all extremities with 2+ DTRs and 2+ pulses.  Lymph: No lymphadenopathy of posterior or anterior cervical chain or axillae bilaterally.  Gait normal with good balance and coordination.  MSK:  Non tender with full range of motion and good stability and symmetric strength and tone of  elbows, wrist, hip, knee and ankles bilaterally.     Impression and Recommendations:     This case required medical decision making of moderate complexity. The above documentation has been reviewed and is accurate and complete Lyndal Pulley, DO       Note: This dictation was prepared with Dragon dictation along with smaller phrase technology. Any transcriptional errors that result from this process are unintentional.

## 2019-01-26 ENCOUNTER — Telehealth: Payer: Self-pay | Admitting: Psychiatry

## 2019-01-26 NOTE — Telephone Encounter (Signed)
Pt called to ask nurse to return call. Need to get with you about med changes and when old Rx will run out sooner due to increase and wanting to use up what she has already. [phone]

## 2019-02-03 ENCOUNTER — Other Ambulatory Visit: Payer: Self-pay

## 2019-02-03 ENCOUNTER — Telehealth: Payer: Self-pay | Admitting: Psychiatry

## 2019-02-03 ENCOUNTER — Other Ambulatory Visit: Payer: Self-pay | Admitting: Psychiatry

## 2019-02-03 DIAGNOSIS — F4310 Post-traumatic stress disorder, unspecified: Secondary | ICD-10-CM

## 2019-02-03 DIAGNOSIS — F902 Attention-deficit hyperactivity disorder, combined type: Secondary | ICD-10-CM

## 2019-02-03 MED ORDER — SERTRALINE HCL 100 MG PO TABS: 200.0000 mg | ORAL_TABLET | Freq: Every day | ORAL | 1 refills | Status: DC

## 2019-02-03 MED ORDER — AMPHETAMINE-DEXTROAMPHETAMINE 30 MG PO TABS: ORAL_TABLET | ORAL | 0 refills | Status: DC

## 2019-02-03 NOTE — Telephone Encounter (Signed)
Last refill Adderall 12/09/2018 #90, pend Rx to be sent to pharmacy in [place]. Per request Updated rx for Sertraline sent as well

## 2019-02-03 NOTE — Progress Notes (Signed)
[facility] [place] [place] and confirmed Adderall 30 mg is out of stock. [facility] and new script sent to [facility] in [place], [place]

## 2019-02-03 NOTE — Telephone Encounter (Signed)
Script sent to requested pharmacy in [place] after pt called after hours. Pt called answering service again after hours with message that pharmacy script was sent to does not have Adderall in stock and she would like script sent to [facility] instead at [phone]. Will request nurse call pharmacy tomorrow to cancel previous Adderall script and pend new script for [place].

## 2019-02-03 NOTE — Telephone Encounter (Signed)
[facility] dated 01[facility]08[facility]2019 for adderall

## 2019-02-03 NOTE — Telephone Encounter (Signed)
Toni Collins returned call from a week ago about her Lyrica.  I told her we had submitted the new prescription and they had it ready to be able to fill.  She said she would call them about sending it.  Then she also reported that she needed refill of her Zoloft and Adderall.  [facility]. They filled it under the old RX of 1 1/2 q day for #45 not for 2 q day #60.  So she is out early. I don't see an RX for [facility]. But she said it was last filled at the [place].  Either way she needs a refill.  She also needs a Refill of her Adderall.  However, she has [facility] outstanding [facility] at the [facility] that she never picked up.  That one needs to be cancelled and [facility] is aware that she is not going to pick it up.  They have been out of town due to deaths in the family.  Not sure when they will get back.  She asks that these refills be sent to [facility] [address] [place]  [address]

## 2019-02-15 ENCOUNTER — Encounter: Payer: Self-pay | Admitting: Family Medicine

## 2019-02-16 ENCOUNTER — Encounter: Payer: Self-pay | Admitting: Family Medicine

## 2019-02-20 ENCOUNTER — Ambulatory Visit (INDEPENDENT_AMBULATORY_CARE_PROVIDER_SITE_OTHER): Payer: Self-pay | Admitting: Family Medicine

## 2019-02-20 ENCOUNTER — Other Ambulatory Visit: Payer: Self-pay

## 2019-02-20 DIAGNOSIS — M545 Low back pain, unspecified: Secondary | ICD-10-CM

## 2019-02-20 DIAGNOSIS — G894 Chronic pain syndrome: Secondary | ICD-10-CM
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
## 2019-02-24 ENCOUNTER — Ambulatory Visit: Payer: Self-pay | Admitting: Family Medicine

## 2019-02-25 ENCOUNTER — Ambulatory Visit: Payer: Self-pay | Admitting: Psychiatry

## 2019-03-02 ENCOUNTER — Encounter: Payer: Self-pay | Admitting: Family Medicine

## 2019-03-03 ENCOUNTER — Telehealth: Payer: Self-pay | Admitting: Psychiatry

## 2019-03-03 NOTE — Telephone Encounter (Signed)
After-hours phone call from patient relates making an appointment at the office but forgetting to ask for a refill of her Adderall 30 mg IR as 1 every morning and 1/2 twice daily having only 1/2 tablet remaining for tomorrow morning needing the fill by 0830 on 03/04/2019 at [facility] similar to on-call provider sending prescription [place]03[place] to the death of relatives.  She states she becomes highly anxious without her Adderall dose logging chief concern of anxiety as the purpose of her phone call.  She understands office procedures from 5 years of care wishing to facilitate overcoming the office daytime work load by helping with refill tonight intervening for her anxiety..  The Adderall fill can be routed to my refill requests Wednesday morning 03/04/2019 if helpful and approved by Dr. Casimiro Needle service procedurally.

## 2019-03-04 ENCOUNTER — Other Ambulatory Visit: Payer: Self-pay | Admitting: Psychiatry

## 2019-03-04 ENCOUNTER — Other Ambulatory Visit: Payer: Self-pay

## 2019-03-04 DIAGNOSIS — F902 Attention-deficit hyperactivity disorder, combined type: Secondary | ICD-10-CM

## 2019-03-04 MED ORDER — AMPHETAMINE-DEXTROAMPHETAMINE 30 MG PO TABS: ORAL_TABLET | ORAL | 0 refills | Status: DC

## 2019-03-08 ENCOUNTER — Other Ambulatory Visit: Payer: Self-pay | Admitting: Family Medicine

## 2019-03-08 ENCOUNTER — Encounter: Payer: Self-pay | Admitting: Family Medicine

## 2019-03-09 NOTE — Telephone Encounter (Signed)
Left message for patient to call back for prescription.

## 2019-03-10 ENCOUNTER — Other Ambulatory Visit: Payer: Self-pay

## 2019-03-10 MED ORDER — HORIZANT 600 MG PO TBCR: 600.0000 mg | EXTENDED_RELEASE_TABLET | Freq: Every day | ORAL | 3 refills | Status: DC

## 2019-03-10 NOTE — Telephone Encounter (Signed)
Left message for patient to see if she would like to try [facility] instead. Awaiting call back.

## 2019-03-18 ENCOUNTER — Encounter: Payer: Self-pay | Admitting: Family Medicine

## 2019-03-23 ENCOUNTER — Encounter: Payer: Self-pay | Admitting: Family Medicine

## 2019-03-23 ENCOUNTER — Telehealth (INDEPENDENT_AMBULATORY_CARE_PROVIDER_SITE_OTHER): Payer: Self-pay | Admitting: Family Medicine

## 2019-03-23 ENCOUNTER — Other Ambulatory Visit: Payer: Self-pay

## 2019-03-23 DIAGNOSIS — M797 Fibromyalgia: Secondary | ICD-10-CM

## 2019-03-23 DIAGNOSIS — G894 Chronic pain syndrome: Secondary | ICD-10-CM

## 2019-03-23 MED ORDER — OXYCODONE HCL 10 MG PO TABS: ORAL_TABLET | ORAL | 0 refills | Status: DC

## 2019-03-23 MED ORDER — OXYCODONE HCL 10 MG PO TABS: 10.0000 mg | ORAL_TABLET | Freq: Four times a day (QID) | ORAL | 0 refills | Status: DC

## 2019-03-23 NOTE — Progress Notes (Signed)
This visit type was conducted due to national recommendations for restrictions regarding the COVID-19 pandemic in an effort to limit this patient's exposure and mitigate transmission in our community.   Virtual Visit via Video Note  I connected with Toni Collins on 03/23/19 at  9:00 AM EDT by a video enabled telemedicine application and verified that I am speaking with the correct person using two identifiers.  Location patient: home Location provider:work or home office Persons participating in the virtual visit: patient, provider  I discussed the limitations of evaluation and management by telemedicine and the availability of in person appointments. The patient expressed understanding and agreed to proceed.   HPI: This is a chronic pain management follow-up visit.  Toni Collins has history of posttraumatic stress disorder, fibromyalgia, chronic back pain.  She has been on oxycodone for several years.  She was tried on multiple medications for fibromyalgia and is currently on Lyrica.  She states that she has had some recent bilateral low back pain but no sciatica symptoms.  No numbness or weakness.  No recent injury.  They are currently heating their home with wood and she has had to do a lot of lifting because of that.  Indication for chronic opioid: Chronic fibromyalgia and back pain Medication and dose: Oxycodone 10 mg every 6 hours # pills per month: 120 Last UDS date: 11/19 Opioid Treatment Agreement signed (Y/N): yes Opioid Treatment Agreement last reviewed with patient:  03/23/2019 [facility] reviewed this encounter (include red flags):  03/23/19    ROS: See pertinent positives and negatives per HPI.  Past Medical History:  Diagnosis Date  . ADHD   . Anxiety   . Colon polyps   . DEPRESSION 09/02/2008  . Fibromyalgia   . GRIEF REACTION 09/30/2009  . INSOMNIA, CHRONIC 09/02/2008  . PREDIABETES 03/10/2009    Past Surgical History:  Procedure Laterality Date  . ABDOMINAL  HYSTERECTOMY  2002   TAH  . [place], 2000   x4  . TMJ ARTHROPLASTY     x2    Family History  Problem Relation Age of Onset  . Arthritis Mother        rhematiod  . Diabetes Maternal Grandmother   . Depression Neg Hx        family    SOCIAL HX: Non-smoker   Current Outpatient Medications:  .  amphetamine-dextroamphetamine (ADDERALL) 30 MG tablet, Take 1 tab po q am and then 1/2 tab po BID, Disp: 60 tablet, Rfl: 0 .  Diclofenac Sodium 2 % SOLN, Place 2 g onto the skin 2 (two) times daily., Disp: 112 g, Rfl: 3 .  ondansetron (ZOFRAN-ODT) 4 MG disintegrating tablet, DISSOLVE/TAKE 1 TABLET BY MOUTH EVERY 8 HOURS AS NEEDED FOR NAUSEA AND VOMITING, Disp: 20 tablet, Rfl: 1 .  Oxycodone HCl 10 MG TABS, Take one tablet every 6 hours.  May refill in one month., Disp: 120 tablet, Rfl: 0 .  Oxycodone HCl 10 MG TABS, Take 1 tablet (10 mg total) by mouth 4 (four) times daily., Disp: 120 tablet, Rfl: 0 .  Oxycodone HCl 10 MG TABS, Take one tablet every 6 hours.  May refill in two months., Disp: 120 tablet, Rfl: 0 .  pregabalin (LYRICA) 150 MG capsule, Take 1 capsule (150 mg total) by mouth 3 (three) times daily., Disp: 270 capsule, Rfl: 1 .  sertraline (ZOLOFT) 100 MG tablet, Take 2 tablets (200 mg total) by mouth daily., Disp: 60 tablet, Rfl: 1  EXAM:  VITALS per  patient if applicable:  GENERAL: alert, oriented, appears well and in no acute distress  HEENT: atraumatic, conjunttiva clear, no obvious abnormalities on inspection of external nose and ears  NECK: normal movements of the head and neck  LUNGS: on inspection no signs of respiratory distress, breathing rate appears normal, no obvious gross SOB, gasping or wheezing  CV: no obvious cyanosis  MS: moves all visible extremities without noticeable abnormality  PSYCH/NEURO: pleasant and cooperative, no obvious depression or anxiety, speech and thought processing grossly intact  ASSESSMENT AND  PLAN:  Discussed the following assessment and plan:  Chronic fibromyalgia and back pain stable  -Patient will need urine drug screen soon.  We will plan to get 68-month follow-up visit -Refilled her oxycodone for 3 months with refill date on or after 03/25/2019    I discussed the assessment and treatment plan with the patient. The patient was provided an opportunity to ask questions and all were answered. The patient agreed with the plan and demonstrated an understanding of the instructions.   The patient was advised to call back or seek an in-person evaluation if the symptoms worsen or if the condition fails to improve as anticipated.   Carolann Littler, MD

## 2019-04-06 ENCOUNTER — Ambulatory Visit: Payer: Self-pay | Admitting: Psychiatry

## 2019-04-06 ENCOUNTER — Encounter

## 2019-04-09 ENCOUNTER — Other Ambulatory Visit: Payer: Self-pay | Admitting: Psychiatry

## 2019-04-09 DIAGNOSIS — F4310 Post-traumatic stress disorder, unspecified: Secondary | ICD-10-CM

## 2019-04-09 NOTE — Telephone Encounter (Signed)
Missed last apt nothing scheduled Last seen in Aug

## 2019-04-13 ENCOUNTER — Other Ambulatory Visit: Payer: Self-pay | Admitting: Family Medicine

## 2019-04-16 ENCOUNTER — Telehealth: Payer: Self-pay | Admitting: Psychiatry

## 2019-04-16 NOTE — Telephone Encounter (Signed)
Pt wants to know if there is something she can get called in for her anxiety. She has made an appt for 12/30. [facility] [place]

## 2019-04-16 NOTE — Telephone Encounter (Signed)
Put her on the cancellation list.  She missed her last appointment.  I am not going to prescribe a new medicine until I see her.  NO WORK IN TIME

## 2019-04-17 ENCOUNTER — Telehealth: Payer: Self-pay | Admitting: Psychiatry

## 2019-04-17 ENCOUNTER — Other Ambulatory Visit: Payer: Self-pay | Admitting: Psychiatry

## 2019-04-17 DIAGNOSIS — G894 Chronic pain syndrome: Secondary | ICD-10-CM

## 2019-04-17 MED ORDER — PREGABALIN 150 MG PO CAPS: 150.0000 mg | ORAL_CAPSULE | Freq: Three times a day (TID) | ORAL | 0 refills | Status: DC

## 2019-04-17 NOTE — Telephone Encounter (Signed)
Pt. Made aware and stated she will not miss her next appt.

## 2019-04-17 NOTE — Telephone Encounter (Signed)
OK to continue this? 

## 2019-04-17 NOTE — Telephone Encounter (Signed)
Prescription sent

## 2019-04-17 NOTE — Telephone Encounter (Signed)
Patient has been missing too many scheduled appointments.  This is jeopardizing her continued treatment at our practice.

## 2019-04-17 NOTE — Telephone Encounter (Signed)
[name] called and reported that she had left a message on our machine that she wasn't able to make her last appt. Due to being in [place]. Her Mother-in-Law had passed and they were settling her estate. She has an appt coming up next month. It was the earliest they could get her in. I relayed to her your message and how it is important for her to keep her appts. She was very tearful and stated she is a mess right now and her kids are coming home to decorate for christmas and she does not want them to see her like this. She says she just needs enough Lyrica to get her through until Tuesday when she can pick up her refill. Please Advise.

## 2019-04-17 NOTE — Progress Notes (Signed)
Patient is not keeping regular appointments.  It is unclear whether she is requesting early refills of Lyrica but it appears so.  We will give limited quantities.

## 2019-04-17 NOTE — Telephone Encounter (Signed)
Pt is thinking she is down because she is out of Lyrica. Will try to  Move up appt but can she get a few called in to tide her until she gets mail order.  [facility]

## 2019-04-19 NOTE — Telephone Encounter (Signed)
OK to refill once.   

## 2019-04-20 ENCOUNTER — Other Ambulatory Visit: Payer: Self-pay | Admitting: Psychiatry

## 2019-04-20 DIAGNOSIS — F4310 Post-traumatic stress disorder, unspecified: Secondary | ICD-10-CM

## 2019-05-04 ENCOUNTER — Telehealth: Payer: Self-pay | Admitting: Psychiatry

## 2019-05-04 ENCOUNTER — Other Ambulatory Visit: Payer: Self-pay

## 2019-05-04 DIAGNOSIS — F902 Attention-deficit hyperactivity disorder, combined type: Secondary | ICD-10-CM

## 2019-05-04 MED ORDER — AMPHETAMINE-DEXTROAMPHETAMINE 30 MG PO TABS: ORAL_TABLET | ORAL | 0 refills | Status: DC

## 2019-05-04 NOTE — Telephone Encounter (Signed)
Pt has appt Dec 30, but will need her Adderall RF before then. Please send to [place], [place]. She understands if you can only send enough until the appt.

## 2019-05-04 NOTE — Telephone Encounter (Signed)
Last refill 04/05/2019 Apt 05/27/2019

## 2019-05-18 ENCOUNTER — Telehealth: Payer: Self-pay | Admitting: Psychiatry

## 2019-05-18 NOTE — Telephone Encounter (Signed)
Rx was already submitted this month for her Adderall.

## 2019-05-18 NOTE — Telephone Encounter (Signed)
Patient filled #[phone] on 12/07, for 23 day supply to get to apt on 05/26/2020

## 2019-05-18 NOTE — Telephone Encounter (Signed)
Pt would like enough Adderall to last her until her appt. On 05/26/20. [facility] [place]

## 2019-05-18 NOTE — Telephone Encounter (Signed)
And that she needs enough to get her to her appt. Please advise.

## 2019-05-18 NOTE — Telephone Encounter (Signed)
I will not give her a refill until she keeps an appointment.  If she misses the next point appointment I will discharge her from the [facility].  she is no showed the last 2 appointments.  I have not decided but I may DC her from the practice anyway for repeated, excessive No Shows.

## 2019-05-18 NOTE — Telephone Encounter (Signed)
She stated she ran out today.

## 2019-05-19 NOTE — Telephone Encounter (Signed)
Pt. Wants to know if there is something she can get to help her through Christmas. Her anxiety has increased and she says she is having the worst time of her life. Please advise

## 2019-05-20 NOTE — Telephone Encounter (Signed)
Please review

## 2019-05-25 NOTE — Telephone Encounter (Signed)
Pt. Has an appt on 12/30. Closing out this message.

## 2019-05-27 ENCOUNTER — Other Ambulatory Visit: Payer: Self-pay

## 2019-05-27 ENCOUNTER — Ambulatory Visit (INDEPENDENT_AMBULATORY_CARE_PROVIDER_SITE_OTHER): Payer: Self-pay | Admitting: Psychiatry

## 2019-05-27 ENCOUNTER — Encounter: Payer: Self-pay | Admitting: Psychiatry

## 2019-05-27 VITALS — BP 145/83 | HR 82

## 2019-05-27 DIAGNOSIS — F5105 Insomnia due to other mental disorder: Secondary | ICD-10-CM

## 2019-05-27 DIAGNOSIS — G894 Chronic pain syndrome: Secondary | ICD-10-CM

## 2019-05-27 DIAGNOSIS — F902 Attention-deficit hyperactivity disorder, combined type: Secondary | ICD-10-CM

## 2019-05-27 DIAGNOSIS — F39 Unspecified mood [affective] disorder: Secondary | ICD-10-CM

## 2019-05-27 DIAGNOSIS — F411 Generalized anxiety disorder: Secondary | ICD-10-CM

## 2019-05-27 DIAGNOSIS — F4310 Post-traumatic stress disorder, unspecified: Secondary | ICD-10-CM

## 2019-05-27 MED ORDER — QUETIAPINE FUMARATE 100 MG PO TABS: ORAL_TABLET | ORAL | 1 refills | Status: DC

## 2019-05-27 MED ORDER — AMPHETAMINE-DEXTROAMPHETAMINE 30 MG PO TABS: ORAL_TABLET | ORAL | 0 refills | Status: DC
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
## 2019-06-13 ENCOUNTER — Other Ambulatory Visit: Payer: Self-pay | Admitting: Family Medicine

## 2019-06-13 ENCOUNTER — Other Ambulatory Visit: Payer: Self-pay | Admitting: Psychiatry

## 2019-06-13 DIAGNOSIS — G894 Chronic pain syndrome: Secondary | ICD-10-CM

## 2019-06-13 DIAGNOSIS — F4310 Post-traumatic stress disorder, unspecified: Secondary | ICD-10-CM

## 2019-06-17 ENCOUNTER — Other Ambulatory Visit: Payer: Self-pay

## 2019-06-17 DIAGNOSIS — F4310 Post-traumatic stress disorder, unspecified: Secondary | ICD-10-CM

## 2019-06-17 MED ORDER — SERTRALINE HCL 100 MG PO TABS: 200.0000 mg | ORAL_TABLET | Freq: Every day | ORAL | 2 refills | Status: DC

## 2019-06-18 ENCOUNTER — Other Ambulatory Visit: Payer: Self-pay | Admitting: Psychiatry

## 2019-06-18 DIAGNOSIS — F4310 Post-traumatic stress disorder, unspecified: Secondary | ICD-10-CM

## 2019-06-18 NOTE — Telephone Encounter (Signed)
Can you check how she's doing and if she wants a 90 day?

## 2019-06-19 ENCOUNTER — Encounter: Payer: Self-pay | Admitting: Family Medicine

## 2019-06-19 ENCOUNTER — Other Ambulatory Visit: Payer: Self-pay | Admitting: Psychiatry

## 2019-06-19 DIAGNOSIS — F4310 Post-traumatic stress disorder, unspecified: Secondary | ICD-10-CM

## 2019-06-19 MED ORDER — QUETIAPINE FUMARATE 25 MG PO TABS: 75.0000 mg | ORAL_TABLET | Freq: Every day | ORAL | 0 refills | Status: DC

## 2019-06-19 NOTE — Telephone Encounter (Signed)
Pt. Made aware.

## 2019-06-19 NOTE — Telephone Encounter (Signed)
Please tell her I will send in a prescription for a lower dose quetiapine 25 mg tablet instead of the current 100 mg tablet.  That way she can take 2 of the 25 mg that would equal to half pill she currently has but be able to work up to 100 mg more gradually.  She also does not have to split tablets.  This would also allow her to work even further towards the goal of 200 mg daily by working up just 25 mg at a time.

## 2019-06-19 NOTE — Telephone Encounter (Signed)
Spoke with pt and she states she took a half of a pill and it knocked her out so she has been taking a quarter of it. She is trying to work herself up to a half tablet. She is tolerating well and does not think at this time she wants a 90 day supply. Her anxiety has been bothering her and she woke up today and cried for about 2 hours. She feels like she is terrified but doesn't know why.

## 2019-06-22 ENCOUNTER — Encounter: Payer: Self-pay | Admitting: Family Medicine

## 2019-06-22 ENCOUNTER — Other Ambulatory Visit: Payer: Self-pay

## 2019-06-22 ENCOUNTER — Telehealth (INDEPENDENT_AMBULATORY_CARE_PROVIDER_SITE_OTHER): Payer: Self-pay | Admitting: Family Medicine

## 2019-06-22 DIAGNOSIS — R509 Fever, unspecified: Secondary | ICD-10-CM

## 2019-06-22 DIAGNOSIS — R05 Cough: Secondary | ICD-10-CM

## 2019-06-22 DIAGNOSIS — R053 Chronic cough: Secondary | ICD-10-CM

## 2019-06-22 DIAGNOSIS — G894 Chronic pain syndrome: Secondary | ICD-10-CM

## 2019-06-22 MED ORDER — OXYCODONE HCL 10 MG PO TABS: 10.0000 mg | ORAL_TABLET | Freq: Four times a day (QID) | ORAL | 0 refills | Status: DC

## 2019-06-22 MED ORDER — OXYCODONE HCL 10 MG PO TABS: ORAL_TABLET | ORAL | 0 refills | Status: DC

## 2019-06-22 NOTE — Progress Notes (Signed)
This visit type was conducted due to national recommendations for restrictions regarding the COVID-19 pandemic in an effort to limit this patient's exposure and mitigate transmission in our community.   Virtual Visit via Telephone Note  I connected with Toni Collins on 06/22/19 at 10:30 AM EST by telephone and verified that I am speaking with the correct person using two identifiers.   I discussed the limitations, risks, security and privacy concerns of performing an evaluation and management service by telephone and the availability of in person appointments. I also discussed with the patient that there may be a patient responsible charge related to this service. The patient expressed understanding and agreed to proceed.  Location patient: home Location provider: work or home office Participants present for the call: patient, provider Patient did not have a visit in the prior 7 days to address this/these issue(s).   History of Present Illness: Toni Collins relates that she has had some coughing going on about 4 to 5 months.  Non-smoker.  She states this occurs more often when she is eating or drinking.  She is not aware of any aspiration.  She does have also frequent coughing at night.  She is also reporting for the past few weeks since early January fever up to 100.8.  She states that she is scheduled to get Covid tested tomorrow.  No known sick exposure.  She is not aware of any postnasal drip symptoms.  No obvious wheezing.  She does have history of GERD and currently on Nexium 20 mg daily.  No history of asthma.  No ACE inhibitor use.  She has chronic pain syndrome has been on oxycodone for several years.  Last drug screen was 11/19.  She sees psychiatry and they recently started her on Seroquel for some chronic insomnia issues.  She also takes Lyrica per psychiatry as well as Zoloft.  Her pain is currently fairly stable.  She has chronic back pain and chronic fibromyalgia pain.  She has been  tried on multiple things in the past including nonsteroidals, tramadol, tricyclic's, muscle relaxers without improvement.  She still has problems with breakthrough pain and she has been reluctant to see pain management in the past.  Past Medical History:  Diagnosis Date  . ADHD   . Anxiety   . Colon polyps   . DEPRESSION 09/02/2008  . Fibromyalgia   . GRIEF REACTION 09/30/2009  . INSOMNIA, CHRONIC 09/02/2008  . PREDIABETES 03/10/2009   Past Surgical History:  Procedure Laterality Date  . ABDOMINAL HYSTERECTOMY  2002   TAH  . [place], 2000   x4  . TMJ ARTHROPLASTY     x2    reports that she has never smoked. She has never used smokeless tobacco. She reports previous drug use. She reports that she does not drink alcohol. family history includes Arthritis in her mother; Diabetes in her maternal grandmother. No Known Allergies    Observations/Objective: Patient sounds cheerful and well on the phone. I do not appreciate any SOB. Speech and thought processing are grossly intact. Patient reported vitals:  Assessment and Plan:  #1 reported several month history of cough.  She is reporting some possible low-grade fevers for several weeks and we feel like this needs further evaluation.  She may have some GERD issues contributing to her chronic cough.  We suggested the following  -Evaluation through our respiratory clinic.  Suspect she needs chest x-ray and further evaluation for her chronic cough and reported fever.  Certainly  we need to start with Covid testing -We suggested that she double up her Nexium to 40 mg daily -Consider elevate head of bed 4 to 6 inches -Avoid eating within 2 to 3 hours of bedtime -Minimize caffeine intake -Avoid mint products  #2 chronic pain syndrome stable -We explained that she needs follow-up drug testing.  Will need to be seen in office next visit if otherwise cleared -Refill pain medication for 3 months  Follow Up  Instructions:  -3 months as above and also we have set her up to be seen through our respiratory clinic   99441 5-10 99442 11-20 99443 21-30 I did not refer this patient for an OV in the next 24 hours for this/these issue(s).  I discussed the assessment and treatment plan with the patient. The patient was provided an opportunity to ask questions and all were answered. The patient agreed with the plan and demonstrated an understanding of the instructions.   The patient was advised to call back or seek an in-person evaluation if the symptoms worsen or if the condition fails to improve as anticipated.  I provided 25 minutes of non-face-to-face time during this encounter.   Carolann Littler, MD

## 2019-06-23 ENCOUNTER — Encounter: Payer: Self-pay | Admitting: Family Medicine

## 2019-06-23 ENCOUNTER — Ambulatory Visit: Payer: Self-pay

## 2019-06-24 ENCOUNTER — Other Ambulatory Visit: Payer: Self-pay

## 2019-06-24 ENCOUNTER — Encounter: Payer: Self-pay | Admitting: Family Medicine

## 2019-06-24 ENCOUNTER — Telehealth: Payer: Self-pay

## 2019-06-24 DIAGNOSIS — F902 Attention-deficit hyperactivity disorder, combined type: Secondary | ICD-10-CM

## 2019-06-24 MED ORDER — AMPHETAMINE-DEXTROAMPHETAMINE 30 MG PO TABS: ORAL_TABLET | ORAL | 0 refills | Status: DC

## 2019-06-24 NOTE — Telephone Encounter (Signed)
Pt. Called to report she needs a refill on her Adderall. She will be out swoon and has no RF on file.

## 2019-06-24 NOTE — Telephone Encounter (Signed)
Last refill 05/27/2019, will pend for Dr. Clovis Pu to submit once pharmacy verified.

## 2019-07-04 ENCOUNTER — Other Ambulatory Visit: Payer: Self-pay | Admitting: Psychiatry

## 2019-07-04 DIAGNOSIS — F4310 Post-traumatic stress disorder, unspecified: Secondary | ICD-10-CM

## 2019-07-06 ENCOUNTER — Other Ambulatory Visit: Payer: Self-pay

## 2019-07-06 ENCOUNTER — Encounter: Payer: Self-pay | Admitting: Family Medicine

## 2019-07-06 ENCOUNTER — Telehealth: Payer: Self-pay | Admitting: Psychiatry

## 2019-07-06 DIAGNOSIS — G894 Chronic pain syndrome: Secondary | ICD-10-CM

## 2019-07-06 MED ORDER — PREGABALIN 150 MG PO CAPS: 150.0000 mg | ORAL_CAPSULE | Freq: Three times a day (TID) | ORAL | 0 refills | Status: DC

## 2019-07-06 NOTE — Telephone Encounter (Signed)
Pt called stating she needs a Rx sent to [facility] for [facility]. Next appt 3/2

## 2019-07-06 NOTE — Telephone Encounter (Signed)
Last refill 04/20/2019 #270 for Lyrica 150 mg 3/day. Pended for Dr. Clovis Pu to submit

## 2019-07-07 ENCOUNTER — Telehealth: Payer: Self-pay

## 2019-07-07 ENCOUNTER — Encounter: Payer: Self-pay | Admitting: Family Medicine

## 2019-07-07 ENCOUNTER — Ambulatory Visit (INDEPENDENT_AMBULATORY_CARE_PROVIDER_SITE_OTHER): Payer: Self-pay | Admitting: Family Medicine

## 2019-07-07 DIAGNOSIS — M7551 Bursitis of right shoulder: Secondary | ICD-10-CM

## 2019-07-07 DIAGNOSIS — M7552 Bursitis of left shoulder: Secondary | ICD-10-CM

## 2019-07-07 MED ORDER — PREDNISONE 50 MG PO TABS: 50.0000 mg | ORAL_TABLET | Freq: Every day | ORAL | 0 refills | Status: DC

## 2019-07-07 NOTE — Telephone Encounter (Signed)
Left message for patient to call back to try to get in for virtual visit 07/07/2019 at 4:15 pm instead of Friday.

## 2019-07-07 NOTE — Progress Notes (Signed)
Virtual Visit via Video Note  I connected with Toni Collins on 07/07/19 at  4:15 PM EST by a video enabled telemedicine application and verified that I am speaking with the correct person using two identifiers.  Location: Patient: at home alone  Provider: in office    I discussed the limitations of evaluation and management by telemedicine and the availability of in person appointments. The patient expressed understanding and agreed to proceed.  History of Present Illness: Patient is a 54 year old female with chronic pain syndrome who continues to have right shoulder pain.  States that her left shoulder is completely better from the injections in July.  States that certain range of motion's and especially at night still gives her some discomfort.  Patient did notice when she was being treated for poison ivy did significantly better when she was on the steroid for a long amount of time.  Patient states that unfortunately no change in the way of daily activities.  Rates the severity of pain sometimes is 9 out of 10.    Observations/Objective: Alert, oriented x3, seems not in any significant pain at the moment   Assessment and Plan: 54 year old female with right shoulder pain, could be secondary to more of a shoulder bursitis or the possibility of acromioclavicular arthritis.  Prednisone given today.  Warned of potential side effects.  Discussed icing regimen and home exercises.  Patient will come back and see me again if this does not seem to alleviate the pain in the next 2 weeks and will consider repeating injections.   Follow Up Instructions: Patient will send a MyChart message in 10 days and likely see me again in 2 to 4 weeks for injection if not improved    I discussed the assessment and treatment plan with the patient. The patient was provided an opportunity to ask questions and all were answered. The patient agreed with the plan and demonstrated an understanding of the  instructions.   The patient was advised to call back or seek an in-person evaluation if the symptoms worsen or if the condition fails to improve as anticipated.  I provided 35 minutes of face-to-face time during this encounter as well as discussed and reviewed patient's chart.   Lyndal Pulley, DO

## 2019-07-08 ENCOUNTER — Encounter: Payer: Self-pay | Admitting: Family Medicine

## 2019-07-08 ENCOUNTER — Other Ambulatory Visit: Payer: Self-pay

## 2019-07-08 ENCOUNTER — Telehealth (INDEPENDENT_AMBULATORY_CARE_PROVIDER_SITE_OTHER): Payer: Self-pay | Admitting: Family Medicine

## 2019-07-08 DIAGNOSIS — G4700 Insomnia, unspecified: Secondary | ICD-10-CM

## 2019-07-08 DIAGNOSIS — G894 Chronic pain syndrome: Secondary | ICD-10-CM

## 2019-07-08 NOTE — Progress Notes (Signed)
This visit type was conducted due to national recommendations for restrictions regarding the COVID-19 pandemic in an effort to limit this patient's exposure and mitigate transmission in our community.   Virtual Visit via Telephone Note  I connected with Toni Collins on 07/08/19 at  8:15 AM EST by telephone and verified that I am speaking with the correct person using two identifiers.   I discussed the limitations, risks, security and privacy concerns of performing an evaluation and management service by telephone and the availability of in person appointments. I also discussed with the patient that there may be a patient responsible charge related to this service. The patient expressed understanding and agreed to proceed.  Location patient: home Location provider: work or home office Participants present for the call: patient, provider Patient did not have a visit in the prior 7 days to address this/these issue(s).   History of Present Illness:  Toni Collins stating she is having some anxiety symptoms at night.  She sees psychiatry and has been placed on Seroquel currently on 75 mg at night.  She has been unable to titrate further because of side effects.  Her issue is that she has some anxiety symptoms sometimes seem to wakes her up at sleep at night.  Apparently someone had suggested that she may be having some opioid withdrawal at night and recommended looking at possible use of Suboxone-at night.  She has been on regimen of oxycodone 10 mg 4 times daily for many years.  She tries to space this as much as possible about every 6 hours.  She has not been skipping doses.  She also takes high-dose sertraline  Past Medical History:  Diagnosis Date  . ADHD   . Anxiety   . Colon polyps   . DEPRESSION 09/02/2008  . Fibromyalgia   . GRIEF REACTION 09/30/2009  . INSOMNIA, CHRONIC 09/02/2008  . PREDIABETES 03/10/2009   Past Surgical History:  Procedure Laterality Date  . ABDOMINAL  HYSTERECTOMY  2002   TAH  . [place], 2000   x4  . TMJ ARTHROPLASTY     x2    reports that she has never smoked. She has never used smokeless tobacco. She reports previous drug use. She reports that she does not drink alcohol. family history includes Arthritis in her mother; Diabetes in her maternal grandmother. No Known Allergies    Observations/Objective: Patient sounds cheerful and well on the phone. I do not appreciate any SOB. Speech and thought processing are grossly intact. Patient reported vitals:  Assessment and Plan:  Chronic pain syndrome.  Patient complaining of anxiety symptoms especially at night.  She is currently followed by psychiatry and they are working on trying to adjust her medications for anxiety  -We explained we did not feel comfortable prescribing Suboxone at night at this time. -We did discuss again consideration of trying to taper her oxycodone she is very reluctant. -We have encouraged her to continue to work with psychiatry regarding anxiety management issues. -We also discussed the fact that she has had low bone density several years ago with no follow-up.  She currently has no insurance coverage.  Recommend follow-up DEXA scan when/if she gets insurance coverage.  Also reiterated importance of taking regular vitamin D and tried to get 1200 mg calcium per day as much as possible through diet  Follow Up Instructions:  -As above   99441 5-10 99442 11-20 99443 21-30 I did not refer this patient for an OV in the  next 24 hours for this/these issue(s).  I discussed the assessment and treatment plan with the patient. The patient was provided an opportunity to ask questions and all were answered. The patient agreed with the plan and demonstrated an understanding of the instructions.   The patient was advised to call back or seek an in-person evaluation if the symptoms worsen or if the condition fails to improve as  anticipated.  I provided 18 minutes of non-face-to-face time during this encounter.   Carolann Littler, MD

## 2019-07-09 ENCOUNTER — Other Ambulatory Visit: Payer: Self-pay | Admitting: Psychiatry

## 2019-07-09 DIAGNOSIS — F4310 Post-traumatic stress disorder, unspecified: Secondary | ICD-10-CM

## 2019-07-10 ENCOUNTER — Ambulatory Visit: Payer: Self-pay | Admitting: Family Medicine

## 2019-07-13 ENCOUNTER — Other Ambulatory Visit: Payer: Self-pay

## 2019-07-13 ENCOUNTER — Ambulatory Visit (INDEPENDENT_AMBULATORY_CARE_PROVIDER_SITE_OTHER): Payer: Self-pay | Admitting: Psychiatry

## 2019-07-13 ENCOUNTER — Encounter: Payer: Self-pay | Admitting: Psychiatry

## 2019-07-13 VITALS — BP 130/96 | HR 97

## 2019-07-13 DIAGNOSIS — F39 Unspecified mood [affective] disorder: Secondary | ICD-10-CM

## 2019-07-13 DIAGNOSIS — F4310 Post-traumatic stress disorder, unspecified: Secondary | ICD-10-CM

## 2019-07-13 DIAGNOSIS — F5105 Insomnia due to other mental disorder: Secondary | ICD-10-CM

## 2019-07-13 DIAGNOSIS — F902 Attention-deficit hyperactivity disorder, combined type: Secondary | ICD-10-CM

## 2019-07-13 DIAGNOSIS — G894 Chronic pain syndrome: Secondary | ICD-10-CM

## 2019-07-13 DIAGNOSIS — F411 Generalized anxiety disorder: Secondary | ICD-10-CM

## 2019-07-13 MED ORDER — ARIPIPRAZOLE 5 MG PO TABS: 5.0000 mg | ORAL_TABLET | Freq: Every day | ORAL | 2 refills | Status: DC

## 2019-07-13 MED ORDER — CLONIDINE HCL 0.1 MG PO TABS: 0.1000 mg | ORAL_TABLET | Freq: Two times a day (BID) | ORAL | 2 refills | Status: DC

## 2019-07-13 NOTE — Patient Instructions (Addendum)
reduce quetiapine to 2 tablets each night to help sleep  Start clonidine 1/2 tablet at night for 5 days, then 1 tablet at night for 5 days, then half tablet in the morning and 1 tablet at night for 1 week and if tolerated increase to 1 tablet twice a day
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
## 2019-07-22 ENCOUNTER — Telehealth: Payer: Self-pay | Admitting: Psychiatry

## 2019-07-22 ENCOUNTER — Other Ambulatory Visit: Payer: Self-pay

## 2019-07-22 DIAGNOSIS — F902 Attention-deficit hyperactivity disorder, combined type: Secondary | ICD-10-CM

## 2019-07-22 MED ORDER — AMPHETAMINE-DEXTROAMPHETAMINE 30 MG PO TABS: ORAL_TABLET | ORAL | 0 refills | Status: DC

## 2019-07-22 NOTE — Telephone Encounter (Signed)
Patient called and needs a refill on her adderall 30 mg to be [facility] to [facility] [place]. She would like this medicine to be sent in by 12 noon today so husband can pick it up with his meds

## 2019-07-22 NOTE — Telephone Encounter (Signed)
Last refill 06/24/2019, pended for Dr. Clovis Pu to submit

## 2019-07-23 ENCOUNTER — Ambulatory Visit: Payer: Self-pay | Admitting: Mental Health

## 2019-07-27 ENCOUNTER — Other Ambulatory Visit: Payer: Self-pay | Admitting: Psychiatry

## 2019-07-27 ENCOUNTER — Encounter: Payer: Self-pay | Admitting: Family Medicine

## 2019-07-27 DIAGNOSIS — F39 Unspecified mood [affective] disorder: Secondary | ICD-10-CM

## 2019-07-27 DIAGNOSIS — F4310 Post-traumatic stress disorder, unspecified: Secondary | ICD-10-CM

## 2019-07-28 ENCOUNTER — Ambulatory Visit: Payer: Self-pay | Admitting: Psychiatry

## 2019-07-28 MED ORDER — PREDNISONE 50 MG PO TABS: 50.0000 mg | ORAL_TABLET | Freq: Every day | ORAL | 0 refills | Status: DC

## 2019-08-18 ENCOUNTER — Telehealth: Payer: Self-pay | Admitting: Psychiatry

## 2019-08-18 ENCOUNTER — Other Ambulatory Visit: Payer: Self-pay

## 2019-08-18 DIAGNOSIS — F902 Attention-deficit hyperactivity disorder, combined type: Secondary | ICD-10-CM

## 2019-08-18 NOTE — Telephone Encounter (Signed)
Patient called and said that she needs a refill on her adderall to be sent to  the [facility] [place]. She has an appt on 4/19

## 2019-08-18 NOTE — Telephone Encounter (Signed)
Last refill 07/22/2019, pended for Dr. Clovis Pu to submit

## 2019-08-19 MED ORDER — AMPHETAMINE-DEXTROAMPHETAMINE 30 MG PO TABS: ORAL_TABLET | ORAL | 0 refills | Status: DC

## 2019-08-31 ENCOUNTER — Encounter: Payer: Self-pay | Admitting: Family Medicine

## 2019-08-31 MED ORDER — PREDNISONE 50 MG PO TABS: 50.0000 mg | ORAL_TABLET | Freq: Every day | ORAL | 0 refills | Status: DC

## 2019-09-03 ENCOUNTER — Encounter: Payer: Self-pay | Admitting: Family Medicine

## 2019-09-04 NOTE — Telephone Encounter (Signed)
Pt has been scheduled for appt °

## 2019-09-08 ENCOUNTER — Encounter: Payer: Self-pay | Admitting: Family Medicine

## 2019-09-08 ENCOUNTER — Ambulatory Visit (INDEPENDENT_AMBULATORY_CARE_PROVIDER_SITE_OTHER): Payer: Self-pay | Admitting: Family Medicine

## 2019-09-08 ENCOUNTER — Other Ambulatory Visit: Payer: Self-pay

## 2019-09-08 VITALS — BP 126/62 | HR 65 | Temp 98.1°F | Wt 145.4 lb

## 2019-09-08 DIAGNOSIS — M25511 Pain in right shoulder: Secondary | ICD-10-CM

## 2019-09-08 DIAGNOSIS — G894 Chronic pain syndrome: Secondary | ICD-10-CM
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
## 2019-09-08 NOTE — Patient Instructions (Signed)
  Try over the counter Diclofenac gel and apply four times daily

## 2019-09-11 ENCOUNTER — Encounter: Payer: Self-pay | Admitting: Family Medicine

## 2019-09-14 ENCOUNTER — Encounter: Payer: Self-pay | Admitting: Family Medicine

## 2019-09-14 ENCOUNTER — Ambulatory Visit (INDEPENDENT_AMBULATORY_CARE_PROVIDER_SITE_OTHER): Payer: Self-pay | Admitting: Psychiatry

## 2019-09-14 ENCOUNTER — Other Ambulatory Visit: Payer: Self-pay

## 2019-09-14 ENCOUNTER — Encounter: Payer: Self-pay | Admitting: Psychiatry

## 2019-09-14 DIAGNOSIS — G894 Chronic pain syndrome: Secondary | ICD-10-CM

## 2019-09-14 DIAGNOSIS — F411 Generalized anxiety disorder: Secondary | ICD-10-CM

## 2019-09-14 DIAGNOSIS — F902 Attention-deficit hyperactivity disorder, combined type: Secondary | ICD-10-CM

## 2019-09-14 DIAGNOSIS — F39 Unspecified mood [affective] disorder: Secondary | ICD-10-CM

## 2019-09-14 DIAGNOSIS — F5105 Insomnia due to other mental disorder: Secondary | ICD-10-CM

## 2019-09-14 DIAGNOSIS — F4310 Post-traumatic stress disorder, unspecified: Secondary | ICD-10-CM
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
## 2019-09-15 ENCOUNTER — Telehealth: Payer: Self-pay | Admitting: Psychiatry

## 2019-09-15 NOTE — Telephone Encounter (Signed)
Toni Collins called back as follow up from appt yesterday. She said you two were discuss which medication and which doses would be best to refill.  She said for Adderall she thinks it would best if was Adderall 20mg  3/day.  [facility]  Then for the Lyrica she is thinking the dose could be reduced to 100mg  3/day vs 4/day.  She is not due refill from Pt Assistant until 5/5 or so but if there is a dose change they could go ahead and process the order.  We would need to send in a new prescription of the Pt Assistance program if you agree with the change.

## 2019-09-16 ENCOUNTER — Encounter: Payer: Self-pay | Admitting: Family Medicine

## 2019-09-16 MED ORDER — OXYCODONE HCL 10 MG PO TABS: 10.0000 mg | ORAL_TABLET | Freq: Four times a day (QID) | ORAL | 0 refills | Status: DC

## 2019-09-16 MED ORDER — OXYCODONE HCL 10 MG PO TABS: ORAL_TABLET | ORAL | 0 refills | Status: DC

## 2019-09-16 NOTE — Telephone Encounter (Signed)
Pt called to schedule follow up and check status on refill for Adderall. Pharmacy can fill today due to dose change if provider allows and refill for Lyrica dose change as well. [facility] [place]
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
## 2019-09-18 ENCOUNTER — Ambulatory Visit: Payer: Self-pay | Admitting: Family Medicine

## 2019-09-18 ENCOUNTER — Other Ambulatory Visit: Payer: Self-pay

## 2019-09-18 DIAGNOSIS — F902 Attention-deficit hyperactivity disorder, combined type: Secondary | ICD-10-CM

## 2019-09-18 MED ORDER — AMPHETAMINE-DEXTROAMPHETAMINE 30 MG PO TABS: ORAL_TABLET | ORAL | 0 refills | Status: DC

## 2019-09-18 NOTE — Telephone Encounter (Signed)
Noted pended Adderall for today

## 2019-09-18 NOTE — Telephone Encounter (Signed)
Pt requesting refill for adderall . See CC note below.

## 2019-09-25 ENCOUNTER — Telehealth: Payer: Self-pay | Admitting: Psychiatry

## 2019-09-25 ENCOUNTER — Other Ambulatory Visit: Payer: Self-pay

## 2019-09-25 DIAGNOSIS — F902 Attention-deficit hyperactivity disorder, combined type: Secondary | ICD-10-CM

## 2019-09-25 DIAGNOSIS — G894 Chronic pain syndrome: Secondary | ICD-10-CM

## 2019-09-25 MED ORDER — PREGABALIN 150 MG PO CAPS: 150.0000 mg | ORAL_CAPSULE | Freq: Three times a day (TID) | ORAL | 0 refills | Status: DC

## 2019-09-25 NOTE — Telephone Encounter (Signed)
Pt called and needs refill for LYRICA) 150 MG capsule. Please send to pharmacy on file.

## 2019-09-25 NOTE — Telephone Encounter (Signed)
Last refill 07/07/2019, patient has multiple pharmacies on file but will submit to [facility].

## 2019-09-29 ENCOUNTER — Other Ambulatory Visit: Payer: Self-pay | Admitting: Psychiatry

## 2019-09-29 ENCOUNTER — Telehealth: Payer: Self-pay | Admitting: Psychiatry

## 2019-09-29 ENCOUNTER — Other Ambulatory Visit: Payer: Self-pay

## 2019-09-29 DIAGNOSIS — G894 Chronic pain syndrome: Secondary | ICD-10-CM

## 2019-09-29 NOTE — Telephone Encounter (Signed)
Patient gets this through patient assistance, has not been filled since 07/07/2019

## 2019-09-29 NOTE — Telephone Encounter (Signed)
Pt requesting Rx for 10 capsules of Lyrica [facility] mg [facility] [place] . To last until mail order arrives

## 2019-09-29 NOTE — Telephone Encounter (Signed)
You only submitted 10 to [facility] assistance[facility] not [facility].  Needs to have 90 day go to patient assistance.

## 2019-09-29 NOTE — Telephone Encounter (Signed)
[name] called inquiring about her Lyrica refill.  We were to request refill from Patient Assistance and because it takes 10 business days to process and receive the medication, she also ask [place] to send in a local supply to the [place] to tie her over until the Pt. Assistance arrived.  It appears we request a refill of 10 capsules from Pt. Assistance and [facility].  Please send 90 day refill request to Pt. Assistance and send local supply to [facility].

## 2019-09-30 ENCOUNTER — Other Ambulatory Visit: Payer: Self-pay | Admitting: Psychiatry

## 2019-09-30 DIAGNOSIS — G894 Chronic pain syndrome: Secondary | ICD-10-CM

## 2019-09-30 MED ORDER — PREGABALIN 150 MG PO CAPS: 150.0000 mg | ORAL_CAPSULE | Freq: Three times a day (TID) | ORAL | 0 refills | Status: DC

## 2019-09-30 MED ORDER — PREGABALIN 150 MG PO CAPS: ORAL_CAPSULE | ORAL | 0 refills | Status: DC

## 2019-10-02 ENCOUNTER — Telehealth (INDEPENDENT_AMBULATORY_CARE_PROVIDER_SITE_OTHER): Payer: Self-pay | Admitting: Family Medicine

## 2019-10-02 ENCOUNTER — Telehealth: Payer: Self-pay | Admitting: Family Medicine

## 2019-10-02 DIAGNOSIS — R05 Cough: Secondary | ICD-10-CM

## 2019-10-02 DIAGNOSIS — R059 Cough, unspecified: Secondary | ICD-10-CM

## 2019-10-02 DIAGNOSIS — R1310 Dysphagia, unspecified: Secondary | ICD-10-CM

## 2019-10-03 NOTE — Progress Notes (Signed)
Patient ID: Toni Collins, female   DOB: [birth year], 54 y.o.   MRN: [id number]  This visit type was conducted due to national recommendations for restrictions regarding the COVID-19 pandemic in an effort to limit this patient's exposure and mitigate transmission in our community.   Virtual Visit via Video Note  I connected with [name] on 10/03/19 at  2:00 PM EDT by a video enabled telemedicine application and verified that I am speaking with the correct person using two identifiers.  Location patient: home Location provider:work or home office Persons participating in the virtual visit: patient, provider  I discussed the limitations of evaluation and management by telemedicine and the availability of in person appointments. The patient expressed understanding and agreed to proceed.   HPI:  Toni Collins is seen by virtual follow up with several month hx of cough associated with eating and especially drinking of thin fluids.  She does have some occasional mild dysphagia with solid foods. Symptoms have been progressive.   No odynophagia.  Occasional GERD symptoms and recently went back on OTC Nexium with no relief.  She states that she fairly consistently starts coughing as soon as she drinks fluids.    No hoarseness.   Non-smoker.  No appetite changes   Has lost some weight this year and she attributes some of that to recent job that was more physical.  No abdominal pain.  No hx of EGD or other UGI workup.      ROS: See pertinent positives and negatives per HPI.  Past Medical History:  Diagnosis Date  . ADHD   . Anxiety   . Colon polyps   . DEPRESSION 09/02/2008  . Fibromyalgia   . GRIEF REACTION 09/30/2009  . INSOMNIA, CHRONIC 09/02/2008  . PREDIABETES 03/10/2009    Past Surgical History:  Procedure Laterality Date  . ABDOMINAL HYSTERECTOMY  2002   TAH  . [place], 2000   x4  . TMJ ARTHROPLASTY     x2    Family History  Problem Relation  Age of Onset  . Arthritis Mother        rhematiod  . Diabetes Maternal Grandmother   . Depression Neg Hx        family    SOCIAL HX: non-smoker.  No ETOH.     Current Outpatient Medications:  .  amphetamine-dextroamphetamine (ADDERALL) 30 MG tablet, Take 1 tab po q am and then 1/2 tab po BID., Disp: 60 tablet, Rfl: 0 .  ARIPiprazole (ABILIFY) 5 MG tablet, TAKE 1 TABLET BY MOUTH EVERY DAY (Patient not taking: Reported on 09/14/2019), Disp: 90 tablet, Rfl: 0 .  cloNIDine (CATAPRES) 0.1 MG tablet, Take 1 tablet (0.1 mg total) by mouth 2 (two) times daily. (Patient not taking: Reported on 09/14/2019), Disp: 60 tablet, Rfl: 2 .  ondansetron (ZOFRAN-ODT) 4 MG disintegrating tablet, DISSOLVE/TAKE 1 TABLET BY MOUTH EVERY 8 HOURS AS NEEDED FOR NAUSEA AND VOMITING, Disp: 20 tablet, Rfl: 0 .  Oxycodone HCl 10 MG TABS, Take one tablet every 6 hours.  May refill in two months., Disp: 120 tablet, Rfl: 0 .  Oxycodone HCl 10 MG TABS, Take one tablet every 6 hours.  May refill in one month., Disp: 120 tablet, Rfl: 0 .  Oxycodone HCl 10 MG TABS, Take 1 tablet (10 mg total) by mouth 4 (four) times daily. Take 1 tablet by mouth 4 times daily.  May refill on or after 06/22/2019, Disp: 120 tablet, Rfl: 0 .  predniSONE (DELTASONE)  50 MG tablet, Take 1 tablet (50 mg total) by mouth daily., Disp: 5 tablet, Rfl: 0 .  pregabalin (LYRICA) 150 MG capsule, Take 1 capsule by mouth 3 times a day, Disp: 270 capsule, Rfl: 0 .  pregabalin (LYRICA) 150 MG capsule, Take 1 capsule (150 mg total) by mouth in the morning, at noon, and at bedtime., Disp: 30 capsule, Rfl: 0 .  QUEtiapine (SEROQUEL) 25 MG tablet, TAKE 3 TABLETS (75 MG TOTAL) BY MOUTH AT BEDTIME. (Patient not taking: Reported on 09/14/2019), Disp: 270 tablet, Rfl: 0 .  sertraline (ZOLOFT) 100 MG tablet, TAKE 2 TABLETS BY MOUTH EVERY DAY, Disp: 180 tablet, Rfl: 0  EXAM:  VITALS per patient if applicable:  GENERAL: alert, oriented, appears well and in no acute  distress  HEENT: atraumatic, conjunttiva clear, no obvious abnormalities on inspection of external nose and ears  NECK: normal movements of the head and neck  LUNGS: on inspection no signs of respiratory distress, breathing rate appears normal, no obvious gross SOB, gasping or wheezing  CV: no obvious cyanosis  MS: moves all visible extremities without noticeable abnormality  PSYCH/NEURO: pleasant and cooperative, no obvious depression or anxiety, speech and thought processing grossly intact  ASSESSMENT AND PLAN:  Discussed the following assessment and plan:  Several month hx of progressive cough associated with eating and drinking of fluids with occasional solid food dysphagia.   Rule out aspiration/obstruction.  She has had some recent modest weight loss but no appetite change.  -discussed setting up barium swallow with SLP to assess her swallowing.   -in meantime, eat slowly, chew food well, and keep head tilted slightly forward or at least neutral to reduce risk of aspiration.     I discussed the assessment and treatment plan with the patient. The patient was provided an opportunity to ask questions and all were answered. The patient agreed with the plan and demonstrated an understanding of the instructions.   The patient was advised to call back or seek an in-person evaluation if the symptoms worsen or if the condition fails to improve as anticipated.     Carolann Littler, MD

## 2019-10-07 ENCOUNTER — Ambulatory Visit (INDEPENDENT_AMBULATORY_CARE_PROVIDER_SITE_OTHER): Payer: Self-pay | Admitting: Family Medicine

## 2019-10-07 ENCOUNTER — Telehealth: Payer: Self-pay | Admitting: Psychiatry

## 2019-10-07 ENCOUNTER — Encounter: Payer: Self-pay | Admitting: Family Medicine

## 2019-10-07 ENCOUNTER — Other Ambulatory Visit: Payer: Self-pay

## 2019-10-07 VITALS — BP 118/82 | HR 76 | Ht 62.0 in | Wt 128.0 lb

## 2019-10-07 DIAGNOSIS — M67911 Unspecified disorder of synovium and tendon, right shoulder: Secondary | ICD-10-CM | POA: Insufficient documentation

## 2019-10-07 DIAGNOSIS — M25511 Pain in right shoulder: Secondary | ICD-10-CM

## 2019-10-07 DIAGNOSIS — G8929 Other chronic pain: Secondary | ICD-10-CM

## 2019-10-07 MED ORDER — DULOXETINE HCL 30 MG PO CPEP
30.0000 mg | ORAL_CAPSULE | Freq: Every day | ORAL | 3 refills | Status: DC
Start: 2019-10-07 — End: 2019-10-12

## 2019-10-07 MED ORDER — NITROGLYCERIN 0.2 MG/HR TD PT24
MEDICATED_PATCH | TRANSDERMAL | 0 refills | Status: DC
Start: 2019-10-07 — End: 2019-11-04

## 2019-10-07 MED ORDER — GABAPENTIN 100 MG PO CAPS
200.0000 mg | ORAL_CAPSULE | Freq: Every day | ORAL | 3 refills | Status: DC
Start: 2019-10-07 — End: 2019-10-07

## 2019-10-07 MED ORDER — KETOROLAC TROMETHAMINE 60 MG/2ML IM SOLN
60.0000 mg | Freq: Once | INTRAMUSCULAR | Status: AC
Start: 2019-10-07 — End: 2019-10-07
  Administered 2019-10-07: 60 mg via INTRAMUSCULAR

## 2019-10-07 NOTE — Addendum Note (Signed)
Addended by: Judy Pimple R on: 10/07/2019 01:12 PM   Modules accepted: Orders

## 2019-10-07 NOTE — Telephone Encounter (Signed)
Pt called to report she saw Dr. Tamala Julian an [facility]  today and he wants to change her med. Return call @ [phone]

## 2019-10-07 NOTE — Assessment & Plan Note (Signed)
Injection given today, tolerated the procedure well, discussed with patient that I do feel that patient's chronic pain and fibromyalgia is also contributing.  To note patient's true health of her rotator cuff likely ultrasound and MRI is necessary.  Patient does secondary to financial constraints is unable to afford this and is a social determinants of health.  Discussed changing patient sertraline to Cymbalta as well as nitroglycerin patches.  Warned potential side effects.  Patient is in agreement with this.  Patient's is continuing to get pain medication from her primary care provider.  Discussed with her that this type of pain does not usually respond to opiates.  She will continue to take the Lyrica.  Follow-up with me potentially in 4 weeks

## 2019-10-07 NOTE — Progress Notes (Signed)
[facility] Phone: [phone] Subjective:   Fontaine No, am serving as a scribe for Dr. Hulan Saas. This visit occurred during the SARS-CoV-2 public health emergency.  Safety protocols were in place, including screening questions prior to the visit, additional usage of staff PPE, and extensive cleaning of exam room while observing appropriate contact time as indicated for disinfecting solutions.    I'm seeing this patient by the request  of:  Eulas Post, MD  CC: Right shoulder pain  [id number]   07/07/2019 Assessment and Plan: 54 year old female with right shoulder pain, could be secondary to more of a shoulder bursitis or the possibility of acromioclavicular arthritis.  Prednisone given today.  Warned of potential side effects.  Discussed icing regimen and home exercises.  Patient will come back and see me again if this does not seem to alleviate the pain in the next 2 weeks and will consider repeating injections.   Update 10/07/2019 Toni Collins is a 54 y.o. female coming in with complaint of right and left shoulder bursitis. Patient states her pain is worse sleeping and when holding her phone. Pain over anterior aspect of shoulder that radiates down into the elbow and forearm. Occasional pain in the clavicle. Left side is just starting to bother her as she is using it more. Is doing landscaping for work.         Past Medical History:  Diagnosis Date  . ADHD   . Anxiety   . Colon polyps   . DEPRESSION 09/02/2008  . Fibromyalgia   . GRIEF REACTION 09/30/2009  . INSOMNIA, CHRONIC 09/02/2008  . PREDIABETES 03/10/2009   Past Surgical History:  Procedure Laterality Date  . ABDOMINAL HYSTERECTOMY  2002   TAH  . [place], 2000   x4  . TMJ ARTHROPLASTY     x2   Social History   Socioeconomic History  . Marital status: Married    Spouse name: Not on file   . Number of children: Not on file  . Years of education: Not on file  . Highest education level: Not on file  Occupational History  . Not on file  Tobacco Use  . Smoking status: Never Smoker  . Smokeless tobacco: Never Used  Substance and Sexual Activity  . Alcohol use: No  . Drug use: Not Currently  . Sexual activity: Not on file  Other Topics Concern  . Not on file  Social History Narrative  . Not on file   Social Determinants of Health   Financial Resource Strain:   . Difficulty of Paying Living Expenses:   Food Insecurity:   . Worried About Charity fundraiser in the Last Year:   . Arboriculturist in the Last Year:   Transportation Needs:   . Film/video editor (Medical):   Marland Kitchen Lack of Transportation (Non-Medical):   Physical Activity:   . Days of Exercise per Week:   . Minutes of Exercise per Session:   Stress:   . Feeling of Stress :   Social Connections:   . Frequency of Communication with Friends and Family:   . Frequency of Social Gatherings with Friends and Family:   . Attends Religious Services:   . Active Member of Clubs or Organizations:   . Attends Archivist Meetings:   Marland Kitchen Marital Status:    No Known Allergies Family History  Problem Relation Age of  Onset  . Arthritis Mother        rhematiod  . Diabetes Maternal Grandmother   . Depression Neg Hx        family    Current Outpatient Medications (Endocrine & Metabolic):  .  predniSONE (DELTASONE) 50 MG tablet, Take 1 tablet (50 mg total) by mouth daily.  Current Outpatient Medications (Cardiovascular):  .  cloNIDine (CATAPRES) 0.1 MG tablet, Take 1 tablet (0.1 mg total) by mouth 2 (two) times daily. .  nitroGLYCERIN (NITRO-DUR) 0.2 mg/hr patch, Apply 1/4 of a patch to skin once daily.   Current Outpatient Medications (Analgesics):  Marland Kitchen  Oxycodone HCl 10 MG TABS, Take one tablet every 6 hours.  May refill in two months. .  Oxycodone HCl 10 MG TABS, Take one tablet every 6 hours.  May  refill in one month. .  Oxycodone HCl 10 MG TABS, Take 1 tablet (10 mg total) by mouth 4 (four) times daily. Take 1 tablet by mouth 4 times daily.  May refill on or after 06/22/2019   Current Outpatient Medications (Other):  .  amphetamine-dextroamphetamine (ADDERALL) 30 MG tablet, Take 1 tab po q am and then 1/2 tab po BID. Marland Kitchen  ARIPiprazole (ABILIFY) 5 MG tablet, TAKE 1 TABLET BY MOUTH EVERY DAY .  ondansetron (ZOFRAN-ODT) 4 MG disintegrating tablet, DISSOLVE/TAKE 1 TABLET BY MOUTH EVERY 8 HOURS AS NEEDED FOR NAUSEA AND VOMITING .  pregabalin (LYRICA) 150 MG capsule, Take 1 capsule by mouth 3 times a day .  pregabalin (LYRICA) 150 MG capsule, Take 1 capsule (150 mg total) by mouth in the morning, at noon, and at bedtime. Marland Kitchen  QUEtiapine (SEROQUEL) 25 MG tablet, TAKE 3 TABLETS (75 MG TOTAL) BY MOUTH AT BEDTIME. Marland Kitchen  sertraline (ZOLOFT) 100 MG tablet, TAKE 2 TABLETS BY MOUTH EVERY DAY .  DULoxetine (CYMBALTA) 30 MG capsule, Take 1 capsule (30 mg total) by mouth daily.   Reviewed prior external information including notes and imaging from  primary care provider As well as notes that were available from care everywhere and other healthcare systems.  Past medical history, social, surgical and family history all reviewed in electronic medical record.  No pertanent information unless stated regarding to the chief complaint.   Review of Systems:  No headache, visual changes, nausea, vomiting, diarrhea, constipation, dizziness, abdominal pain, skin rash, fevers, chills, night sweats, weight loss, swollen lymph nodes, body aches, joint swelling, chest pain, shortness of breath, mood changes. POSITIVE muscle aches  Objective  Blood pressure 118/82, pulse 76, height 5\' 2"  (1.575 m), weight 128 lb (58.1 kg), SpO2 99 %.   General: Minorly anxious tangential thought process HEENT: Pupils pinpoint noted Respiratory: Patient's speak in full sentences and does not appear short of breath  Cardiovascular: No  lower extremity edema, non tender, no erythema  Neuro: Cranial nerves II through XII are intact, neurovascularly intact in all extremities with 2+ DTRs and 2+ pulses.  Gait normal with good balance and coordination.  MSK: Right shoulder exam shows that patient has 4 out of 5 strength of the rotator cuff.  Tender to palpation.  Patient does have some voluntary guarding noted.  Neck exam mild limited extension of the neck.  After informed written and verbal consent, patient was seated on exam table. Right shoulder was prepped with alcohol swab and utilizing posterior approach, patient's right glenohumeral space was injected with 4:1  marcaine 0.5%: Kenalog 40mg /dL. Patient tolerated the procedure well without immediate complications.    Impression and Recommendations:  This case required medical decision making of moderate complexity. The above documentation has been reviewed and is accurate and complete Lyndal Pulley, DO       Note: This dictation was prepared with Dragon dictation along with smaller phrase technology. Any transcriptional errors that result from this process are unintentional.

## 2019-10-07 NOTE — Patient Instructions (Addendum)
Small rotator cuff tear Nitro patches Nitroglycerin Protocol   Apply 1/4 nitroglycerin patch to affected area daily.  Change position of patch within the affected area every 24 hours.  You may experience a headache during the first 1-2 weeks of using the patch, these should subside.  If you experience headaches after beginning nitroglycerin patch treatment, you may take your preferred over the counter pain reliever.  Another side effect of the nitroglycerin patch is skin irritation or rash related to patch adhesive.  Please notify our office if you develop more severe headaches or rash, and stop the patch.  Tendon healing with nitroglycerin patch may require 12 to 24 weeks depending on the extent of injury.  Men should not use if taking Viagra, Cialis, or Levitra.   Do not use if you have migraines or rosacea. Stop zoloft, Cymbalta 30 mg instead [place] Access-Medicaid See me again in 4 weeks for virtual appt

## 2019-10-12 ENCOUNTER — Other Ambulatory Visit: Payer: Self-pay | Admitting: Family Medicine

## 2019-10-12 NOTE — Telephone Encounter (Signed)
LM with information and to call back to set up an apt if she's wanting to proceed with orthopaedic recommendation.

## 2019-10-12 NOTE — Telephone Encounter (Signed)
Patient is on high-dose sertraline 200 mg daily for severe anxiety.  Typically duloxetine is not as effective for anxiety and sertraline but this could be considered.  However the switch is necessary to be done in several steps and should not be done over phone call.  If she wants to risk her anxiety being worse for potential pain benefit from duloxetine then she needs to schedule an appointment with me and we will discussed how to do it.

## 2019-10-14 ENCOUNTER — Other Ambulatory Visit: Payer: Self-pay | Admitting: Psychiatry

## 2019-10-14 ENCOUNTER — Other Ambulatory Visit: Payer: Self-pay | Admitting: Family Medicine

## 2019-10-14 DIAGNOSIS — F4310 Post-traumatic stress disorder, unspecified: Secondary | ICD-10-CM

## 2019-10-15 ENCOUNTER — Other Ambulatory Visit (HOSPITAL_COMMUNITY): Payer: Self-pay | Admitting: *Deleted

## 2019-10-15 DIAGNOSIS — R1310 Dysphagia, unspecified: Secondary | ICD-10-CM

## 2019-10-16 ENCOUNTER — Other Ambulatory Visit: Payer: Self-pay

## 2019-10-16 ENCOUNTER — Telehealth: Payer: Self-pay | Admitting: Psychiatry

## 2019-10-16 DIAGNOSIS — F902 Attention-deficit hyperactivity disorder, combined type: Secondary | ICD-10-CM

## 2019-10-16 MED ORDER — AMPHETAMINE-DEXTROAMPHETAMINE 30 MG PO TABS: ORAL_TABLET | ORAL | 0 refills | Status: DC

## 2019-10-16 NOTE — Telephone Encounter (Signed)
Pt called to check status on Adderall Rx @ [place]

## 2019-10-16 NOTE — Telephone Encounter (Signed)
Last refill 09/18/2019, pended for Dr. Clovis Pu to submit

## 2019-10-16 NOTE — Telephone Encounter (Signed)
Requesting refill on Adderall. Please send to [place], [place]

## 2019-10-22 ENCOUNTER — Ambulatory Visit (HOSPITAL_COMMUNITY)
Admission: RE | Admit: 2019-10-22 | Discharge: 2019-10-22 | Disposition: A | Discharge status: Home / Self Care | Payer: Self-pay | Source: Ambulatory Visit | Attending: Family Medicine | Admitting: Family Medicine

## 2019-10-22 ENCOUNTER — Other Ambulatory Visit: Payer: Self-pay

## 2019-10-22 DIAGNOSIS — R059 Cough, unspecified: Secondary | ICD-10-CM

## 2019-10-22 DIAGNOSIS — R1310 Dysphagia, unspecified: Secondary | ICD-10-CM | POA: Insufficient documentation

## 2019-10-22 DIAGNOSIS — R05 Cough: Secondary | ICD-10-CM | POA: Insufficient documentation

## 2019-10-22 NOTE — Progress Notes (Signed)
Modified Barium Swallow Progress Note  Patient Details  Name: Toni Collins MRN: [id number] Date of Birth: 1965-07-04  Today's Date: 10/22/2019  Modified Barium Swallow completed.  Full report located under Chart Review in the Imaging Section.  Brief recommendations include the following:  Clinical Impression  Pt exhibits normal oral and pharyngeal phases of swallow. Timing and coordination of laryngeal protective mechanisms were within functional limits and therapist suspects an esophageal source for symptoms. Once solid or liquid bolus passed her UES and noted prominent cricopharyngeus muscle she had an immediate and strong reflexive cough on approximately 80% of swallows. Laryngeal vestibule was fully protected and there were no episodes of penetration or aspiration. Once barium passed the UES there was trace amount of barium that hesitated in the cervical esophageal region. 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 Pt was noted to clear her throat during interaction prior and post MBS. Pt provided with verbal and written information for esophageal precautions many of which she is using currently. Discussed with pt recommendation for ST evaluation for chronic cough with and intervention at outpatient (provided with info). She may benefit from further work up of esophagus with GI. Continue regular texture, thin liquids.         Swallow Evaluation Recommendations   Recommended Consults: Consider esophageal assessment   SLP Diet Recommendations: Regular solids;Thin liquid   Liquid Administration via: Straw;Cup   Medication Administration: Whole meds with liquid   Supervision: Patient able to self feed       Postural Changes: Remain semi-upright after after feeds/meals (Comment);Seated upright at 90 degrees   Oral Care Recommendations: Oral care  BID        Houston Siren 10/22/2019,4:50 PM   Orbie Pyo [email or web address].Ed Risk analyst [phone] Office [phone]

## 2019-10-27 MED ORDER — PANTOPRAZOLE SODIUM 40 MG PO TBEC
40.0000 mg | DELAYED_RELEASE_TABLET | Freq: Every day | ORAL | 1 refills | Status: DC
Start: 2019-10-27 — End: 2020-03-07

## 2019-11-04 ENCOUNTER — Other Ambulatory Visit: Payer: Self-pay | Admitting: Family Medicine

## 2019-11-05 ENCOUNTER — Ambulatory Visit: Payer: Self-pay | Admitting: Family Medicine

## 2019-11-05 NOTE — Progress Notes (Deleted)
Toni Collins Phone: [phone] Subjective:    I'm seeing this patient by the request  of:  Eulas Post, MD  CC:   ASN:[id number]   10/07/2019 Injection given today, tolerated the procedure well, discussed with patient that I do feel that patient's chronic pain and fibromyalgia is also contributing.  To note patient's true health of her rotator cuff likely ultrasound and MRI is necessary.  Patient does secondary to financial constraints is unable to afford this and is a social determinants of health.  Discussed changing patient sertraline to Cymbalta as well as nitroglycerin patches.  Warned potential side effects.  Patient is in agreement with this.  Patient's is continuing to get pain medication from her primary care provider.  Discussed with her that this type of pain does not usually respond to opiates.  She will continue to take the Lyrica.  Follow-up with me potentially in 4 weeks  Update 11/05/2019 Toni Collins is a 54 y.o. female coming in with complaint of right shoulder pain. Patient states   Onset-  Location Duration-  Character- Aggravating factors- Reliving factors-  Therapies tried-  Severity-     Past Medical History:  Diagnosis Date  . ADHD   . Anxiety   . Colon polyps   . DEPRESSION 09/02/2008  . Fibromyalgia   . GRIEF REACTION 09/30/2009  . INSOMNIA, CHRONIC 09/02/2008  . PREDIABETES 03/10/2009   Past Surgical History:  Procedure Laterality Date  . ABDOMINAL HYSTERECTOMY  2002   TAH  . [place], 2000   x4  . TMJ ARTHROPLASTY     x2   Social History   Socioeconomic History  . Marital status: Married    Spouse name: Not on file  . Number of children: Not on file  . Years of education: Not on file  . Highest education level: Not on file  Occupational History  . Not on file  Tobacco Use  . Smoking status: Never Smoker  . Smokeless tobacco:  Never Used  Vaping Use  . Vaping Use: Never used  Substance and Sexual Activity  . Alcohol use: No  . Drug use: Not Currently  . Sexual activity: Not on file  Other Topics Concern  . Not on file  Social History Narrative  . Not on file   Social Determinants of Health   Financial Resource Strain:   . Difficulty of Paying Living Expenses:   Food Insecurity:   . Worried About Charity fundraiser in the Last Year:   . Arboriculturist in the Last Year:   Transportation Needs:   . Film/video editor (Medical):   Marland Kitchen Lack of Transportation (Non-Medical):   Physical Activity:   . Days of Exercise per Week:   . Minutes of Exercise per Session:   Stress:   . Feeling of Stress :   Social Connections:   . Frequency of Communication with Friends and Family:   . Frequency of Social Gatherings with Friends and Family:   . Attends Religious Services:   . Active Member of Clubs or Organizations:   . Attends Archivist Meetings:   Marland Kitchen Marital Status:    No Known Allergies Family History  Problem Relation Age of Onset  . Arthritis Mother        rhematiod  . Diabetes Maternal Grandmother   . Depression Neg Hx  family    Current Outpatient Medications (Endocrine & Metabolic):  .  predniSONE (DELTASONE) 50 MG tablet, TAKE 1 TABLET BY MOUTH EVERY DAY  Current Outpatient Medications (Cardiovascular):  .  cloNIDine (CATAPRES) 0.1 MG tablet, Take 1 tablet (0.1 mg total) by mouth 2 (two) times daily. .  nitroGLYCERIN (NITRODUR - DOSED IN MG/24 HR) 0.2 mg/hr patch, APPLY 1/4 OF A PATCH TO SKIN ONCE DAILY.   Current Outpatient Medications (Analgesics):  Marland Kitchen  Oxycodone HCl 10 MG TABS, Take one tablet every 6 hours.  May refill in two months. .  Oxycodone HCl 10 MG TABS, Take one tablet every 6 hours.  May refill in one month. .  Oxycodone HCl 10 MG TABS, Take 1 tablet (10 mg total) by mouth 4 (four) times daily. Take 1 tablet by mouth 4 times daily.  May refill on or after  06/22/2019   Current Outpatient Medications (Other):  .  amphetamine-dextroamphetamine (ADDERALL) 30 MG tablet, Take 1 tab po q am and then 1/2 tab po BID. Marland Kitchen  ARIPiprazole (ABILIFY) 5 MG tablet, TAKE 1 TABLET BY MOUTH EVERY DAY .  DULoxetine (CYMBALTA) 30 MG capsule, TAKE 1 CAPSULE BY MOUTH EVERY DAY .  ondansetron (ZOFRAN-ODT) 4 MG disintegrating tablet, DISSOLVE/TAKE 1 TABLET BY MOUTH EVERY 8 HOURS AS NEEDED FOR NAUSEA AND VOMITING .  pantoprazole (PROTONIX) 40 MG tablet, Take 1 tablet (40 mg total) by mouth daily. .  pregabalin (LYRICA) 150 MG capsule, Take 1 capsule by mouth 3 times a day .  pregabalin (LYRICA) 150 MG capsule, Take 1 capsule (150 mg total) by mouth in the morning, at noon, and at bedtime. Marland Kitchen  QUEtiapine (SEROQUEL) 25 MG tablet, TAKE 3 TABLETS (75 MG TOTAL) BY MOUTH AT BEDTIME. Marland Kitchen  sertraline (ZOLOFT) 100 MG tablet, TAKE 2 TABLETS BY MOUTH EVERY DAY   Reviewed prior external information including notes and imaging from  primary care provider As well as notes that were available from care everywhere and other healthcare systems.  Past medical history, social, surgical and family history all reviewed in electronic medical record.  No pertanent information unless stated regarding to the chief complaint.   Review of Systems:  No headache, visual changes, nausea, vomiting, diarrhea, constipation, dizziness, abdominal pain, skin rash, fevers, chills, night sweats, weight loss, swollen lymph nodes, body aches, joint swelling, chest pain, shortness of breath, mood changes. POSITIVE muscle aches  Objective  There were no vitals taken for this visit.   General: No apparent distress alert and oriented x3 mood and affect normal, dressed appropriately.  HEENT: Pupils equal, extraocular movements intact  Respiratory: Patient's speak in full sentences and does not appear short of breath  Cardiovascular: No lower extremity edema, non tender, no erythema  Neuro: Cranial nerves II  through XII are intact, neurovascularly intact in all extremities with 2+ DTRs and 2+ pulses.  Gait normal with good balance and coordination.  MSK:  Non tender with full range of motion and good stability and symmetric strength and tone of shoulders, elbows, wrist, hip, knee and ankles bilaterally.     Impression and Recommendations:     The above documentation has been reviewed and is accurate and complete Lyndal Pulley, DO       Note: This dictation was prepared with Dragon dictation along with smaller phrase technology. Any transcriptional errors that result from this process are unintentional.

## 2019-11-09 ENCOUNTER — Encounter: Payer: Self-pay | Admitting: Family Medicine

## 2019-11-10 ENCOUNTER — Other Ambulatory Visit: Payer: Self-pay

## 2019-11-10 MED ORDER — PREDNISONE 5 MG PO TABS
5.0000 mg | ORAL_TABLET | Freq: Every day | ORAL | 0 refills | Status: DC
Start: 2019-11-10 — End: 2020-03-29

## 2019-11-16 ENCOUNTER — Other Ambulatory Visit: Payer: Self-pay

## 2019-11-16 ENCOUNTER — Telehealth: Payer: Self-pay | Admitting: Psychiatry

## 2019-11-16 DIAGNOSIS — F902 Attention-deficit hyperactivity disorder, combined type: Secondary | ICD-10-CM

## 2019-11-16 MED ORDER — AMPHETAMINE-DEXTROAMPHETAMINE 30 MG PO TABS: ORAL_TABLET | ORAL | 0 refills | Status: DC

## 2019-11-16 NOTE — Telephone Encounter (Signed)
Pt requesting refill for Adderall @ [facility]. Apt 6/28

## 2019-11-16 NOTE — Telephone Encounter (Signed)
Last refill 05/21, pended for Dr. Clovis Pu to review and send

## 2019-11-23 ENCOUNTER — Ambulatory Visit: Payer: Self-pay | Admitting: Psychiatry

## 2019-12-14 ENCOUNTER — Telehealth: Payer: Self-pay | Admitting: Psychiatry

## 2019-12-14 ENCOUNTER — Other Ambulatory Visit: Payer: Self-pay

## 2019-12-14 DIAGNOSIS — F902 Attention-deficit hyperactivity disorder, combined type: Secondary | ICD-10-CM

## 2019-12-14 MED ORDER — AMPHETAMINE-DEXTROAMPHETAMINE 30 MG PO TABS: ORAL_TABLET | ORAL | 0 refills | Status: DC

## 2019-12-14 NOTE — Telephone Encounter (Signed)
No 90 day controlled substances for her.

## 2019-12-14 NOTE — Telephone Encounter (Signed)
Patient called and said that she needs a refill on her adderall and would like a [facility] [place]

## 2019-12-16 ENCOUNTER — Ambulatory Visit (INDEPENDENT_AMBULATORY_CARE_PROVIDER_SITE_OTHER): Payer: Self-pay | Admitting: Family Medicine

## 2019-12-16 ENCOUNTER — Encounter: Payer: Self-pay | Admitting: Family Medicine

## 2019-12-16 ENCOUNTER — Other Ambulatory Visit: Payer: Self-pay

## 2019-12-16 VITALS — BP 128/66 | HR 74 | Temp 98.4°F | Wt 148.9 lb

## 2019-12-16 DIAGNOSIS — G8929 Other chronic pain: Secondary | ICD-10-CM

## 2019-12-16 MED ORDER — OXYCODONE HCL 10 MG PO TABS: ORAL_TABLET | ORAL | 0 refills | Status: DC

## 2019-12-16 MED ORDER — OXYCODONE HCL 10 MG PO TABS: 10.0000 mg | ORAL_TABLET | Freq: Four times a day (QID) | ORAL | 0 refills | Status: DC

## 2019-12-16 NOTE — Progress Notes (Signed)
Established Patient Office Visit  Subjective:  Patient ID: Toni Collins, female    DOB: 1966/03/20  Age: 54 y.o. MRN: [id number]  CC:  Chief Complaint  Patient presents with  . pain managenent    pt is here for to discuss pain medication     HPI Toni Collins presents for chronic pain management follow-up.  She is has history of some chronic shoulder difficulties, history of kidney stones, ADD, chronic anxiety, history of recurrent depression, fibromyalgia, hyperlipidemia, chronic insomnia.  She is followed by psychiatry and they are prescribing multiple medications for her including ADD medication and antidepressants.  She has longstanding history of fibromyalgia pain.  She had some chronic back pains as well.  She was tried on multiple medications both over-the-counter and prescription previously without much relief.  She has been on a regimen of oxycodone 10 mg 4 times daily for many years.  She is overdue for urine drug screen.  Denies any constipation or other side effects.  She has some chronic pain even with medication.  We have been reluctant to escalate her medication in the past for safety issues especially with some of her other medications.  Past Medical History:  Diagnosis Date  . ADHD   . Anxiety   . Colon polyps   . DEPRESSION 09/02/2008  . Fibromyalgia   . GRIEF REACTION 09/30/2009  . INSOMNIA, CHRONIC 09/02/2008  . PREDIABETES 03/10/2009    Past Surgical History:  Procedure Laterality Date  . ABDOMINAL HYSTERECTOMY  2002   TAH  . [place], 2000   x4  . TMJ ARTHROPLASTY     x2    Family History  Problem Relation Age of Onset  . Arthritis Mother        rhematiod  . Diabetes Maternal Grandmother   . Depression Neg Hx        family    Social History   Socioeconomic History  . Marital status: Married    Spouse name: Not on file  . Number of children: Not on file  . Years of education: Not on file  . Highest  education level: Not on file  Occupational History  . Not on file  Tobacco Use  . Smoking status: Never Smoker  . Smokeless tobacco: Never Used  Vaping Use  . Vaping Use: Never used  Substance and Sexual Activity  . Alcohol use: No  . Drug use: Not Currently  . Sexual activity: Not on file  Other Topics Concern  . Not on file  Social History Narrative  . Not on file   Social Determinants of Health   Financial Resource Strain:   . Difficulty of Paying Living Expenses:   Food Insecurity:   . Worried About Charity fundraiser in the Last Year:   . Arboriculturist in the Last Year:   Transportation Needs:   . Film/video editor (Medical):   Marland Kitchen Lack of Transportation (Non-Medical):   Physical Activity:   . Days of Exercise per Week:   . Minutes of Exercise per Session:   Stress:   . Feeling of Stress :   Social Connections:   . Frequency of Communication with Friends and Family:   . Frequency of Social Gatherings with Friends and Family:   . Attends Religious Services:   . Active Member of Clubs or Organizations:   . Attends Archivist Meetings:   Marland Kitchen Marital Status:   Intimate Partner Violence:   .  Fear of Current or Ex-Partner:   . Emotionally Abused:   Marland Kitchen Physically Abused:   . Sexually Abused:     Outpatient Medications Prior to Visit  Medication Sig Dispense Refill  . amphetamine-dextroamphetamine (ADDERALL) 30 MG tablet Take 1 tab po q am and then 1/2 tab po BID. 60 tablet 0  . ARIPiprazole (ABILIFY) 5 MG tablet TAKE 1 TABLET BY MOUTH EVERY DAY 90 tablet 0  . cloNIDine (CATAPRES) 0.1 MG tablet Take 1 tablet (0.1 mg total) by mouth 2 (two) times daily. 60 tablet 2  . DULoxetine (CYMBALTA) 30 MG capsule TAKE 1 CAPSULE BY MOUTH EVERY DAY 90 capsule 2  . nitroGLYCERIN (NITRODUR - DOSED IN MG/24 HR) 0.2 mg/hr patch APPLY 1/4 OF A PATCH TO SKIN ONCE DAILY. 30 patch 0  . ondansetron (ZOFRAN-ODT) 4 MG disintegrating tablet DISSOLVE/TAKE 1 TABLET BY MOUTH EVERY  8 HOURS AS NEEDED FOR NAUSEA AND VOMITING 20 tablet 0  . pantoprazole (PROTONIX) 40 MG tablet Take 1 tablet (40 mg total) by mouth daily. 30 tablet 1  . predniSONE (DELTASONE) 5 MG tablet Take 1 tablet (5 mg total) by mouth daily with breakfast. 20 mg for 5 days, 15mg  for 5 days, 10 mg for 5 days and 5 mg for 5 days 50 tablet 0  . predniSONE (DELTASONE) 50 MG tablet TAKE 1 TABLET BY MOUTH EVERY DAY 5 tablet 0  . pregabalin (LYRICA) 150 MG capsule Take 1 capsule by mouth 3 times a day 270 capsule 0  . pregabalin (LYRICA) 150 MG capsule Take 1 capsule (150 mg total) by mouth in the morning, at noon, and at bedtime. 30 capsule 0  . QUEtiapine (SEROQUEL) 25 MG tablet TAKE 3 TABLETS (75 MG TOTAL) BY MOUTH AT BEDTIME. 270 tablet 0  . sertraline (ZOLOFT) 100 MG tablet TAKE 2 TABLETS BY MOUTH EVERY DAY 180 tablet 0  . Oxycodone HCl 10 MG TABS Take one tablet every 6 hours.  May refill in two months. 120 tablet 0  . Oxycodone HCl 10 MG TABS Take one tablet every 6 hours.  May refill in one month. 120 tablet 0  . Oxycodone HCl 10 MG TABS Take 1 tablet (10 mg total) by mouth 4 (four) times daily. Take 1 tablet by mouth 4 times daily.  May refill on or after 06/22/2019 120 tablet 0   No facility-administered medications prior to visit.    No Known Allergies  ROS Review of Systems  Constitutional: Negative for chills, fever and unexpected weight change.  Respiratory: Negative for cough and shortness of breath.   Cardiovascular: Negative for chest pain.  Musculoskeletal: Positive for back pain and myalgias.      Objective:    Physical Exam Vitals reviewed.  Constitutional:      Appearance: Normal appearance.  Cardiovascular:     Rate and Rhythm: Normal rate and regular rhythm.  Pulmonary:     Effort: Pulmonary effort is normal.     Breath sounds: Normal breath sounds.  Musculoskeletal:     Right lower leg: No edema.     Left lower leg: No edema.  Neurological:     Mental Status: She is  alert.     BP 128/66 (BP Location: Left Arm, Patient Position: Sitting, Cuff Size: Normal)   Pulse 74   Temp 98.4 F (36.9 C) (Oral)   Wt 148 lb 14.4 oz (67.5 kg)   SpO2 98%   BMI 27.23 kg/m  Wt Readings from Last 3 Encounters:  12/16/19 148  lb 14.4 oz (67.5 kg)  10/07/19 128 lb (58.1 kg)  09/08/19 145 lb 6.4 oz (66 kg)     Health Maintenance Due  Topic Date Due  . Hepatitis C Screening  Never done  . COVID-19 Vaccine (1) Never done  . HIV Screening  Never done  . PAP SMEAR-Modifier  Never done  . MAMMOGRAM  04/20/2018  . COLONOSCOPY  Never done    There are no preventive care reminders to display for this patient.  Lab Results  Component Value Date   TSH 3.45 06/03/2015   Lab Results  Component Value Date   WBC 5.2 12/14/2015   HGB 13.2 12/14/2015   HCT 39.2 12/14/2015   MCV 84.9 12/14/2015   PLT 222.0 12/14/2015   Lab Results  Component Value Date   NA 139 12/14/2015   K 4.0 12/14/2015   CO2 25 12/14/2015   GLUCOSE 89 12/14/2015   BUN 16 12/14/2015   CREATININE 0.78 12/14/2015   BILITOT 0.6 12/14/2015   ALKPHOS 83 12/14/2015   AST 19 12/14/2015   ALT 19 12/14/2015   PROT 7.4 12/14/2015   ALBUMIN 4.2 12/14/2015   CALCIUM 9.6 12/14/2015   GFR 83.91 12/14/2015   Lab Results  Component Value Date   CHOL 204 (H) 06/03/2015   Lab Results  Component Value Date   HDL 63.10 06/03/2015   Lab Results  Component Value Date   LDLCALC 127 (H) 06/03/2015   Lab Results  Component Value Date   TRIG 66.0 06/03/2015   Lab Results  Component Value Date   CHOLHDL 3 06/03/2015   No results found for: HGBA1C    Assessment & Plan:   Problem List Items Addressed This Visit    None    Visit Diagnoses    Other chronic pain    -  Primary   Relevant Medications   Oxycodone HCl 10 MG TABS   Oxycodone HCl 10 MG TABS   Oxycodone HCl 10 MG TABS   Other Relevant Orders   DRUG MONITORING, PANEL 8 WITH CONFIRMATION, URINE    -Urine drug screen ordered  and she will get that today -Refilled her oxycodone for 3 months -Recommend 53-month office follow-up -We strongly advised that she needs complete physical but she wishes to wait till after she gets insurance coverage.  She is hoping to get coverage in the next few months  Meds ordered this encounter  Medications  . Oxycodone HCl 10 MG TABS    Sig: Take one tablet every 6 hours.  May refill in two months.    Dispense:  120 tablet    Refill:  0  . Oxycodone HCl 10 MG TABS    Sig: Take one tablet every 6 hours.  May refill in one month.    Dispense:  120 tablet    Refill:  0  . Oxycodone HCl 10 MG TABS    Sig: Take 1 tablet (10 mg total) by mouth 4 (four) times daily. Take 1 tablet by mouth 4 times daily.  May refill on or after 06/22/2019    Dispense:  120 tablet    Refill:  0    Follow-up: Return in about 3 months (around 03/17/2020).    Carolann Littler, MD

## 2019-12-16 NOTE — Patient Instructions (Signed)

## 2019-12-17 ENCOUNTER — Encounter: Payer: Self-pay | Admitting: Family Medicine

## 2019-12-18 ENCOUNTER — Ambulatory Visit: Payer: Self-pay | Admitting: Family Medicine

## 2019-12-18 LAB — DRUG MONITORING, PANEL 8 WITH CONFIRMATION, URINE
6 Acetylmorphine: NEGATIVE ng/mL (ref <10)
Alcohol Metabolites: NEGATIVE ng/mL
Amphetamine: 1134 ng/mL — ABNORMAL HIGH (ref <250)
Amphetamines: POSITIVE ng/mL — AB (ref <500)
Benzodiazepines: NEGATIVE ng/mL (ref <100)
Buprenorphine, Urine: NEGATIVE ng/mL (ref <5)
Cocaine Metabolite: NEGATIVE ng/mL (ref <150)
Creatinine: 128.6 mg/dL
MDMA: NEGATIVE ng/mL (ref <500)
Marijuana Metabolite: NEGATIVE ng/mL (ref <20)
Methamphetamine: NEGATIVE ng/mL (ref <250)
Opiates: NEGATIVE ng/mL (ref <100)
Oxidant: NEGATIVE ug/mL
Oxycodone: NEGATIVE ng/mL (ref <100)
pH: 5.9 (ref 4.5–9.0)

## 2019-12-18 LAB — DM TEMPLATE

## 2019-12-18 MED ORDER — PREDNISONE 50 MG PO TABS: 50.0000 mg | ORAL_TABLET | Freq: Every day | ORAL | 0 refills | Status: DC

## 2019-12-31 ENCOUNTER — Telehealth: Payer: Self-pay | Admitting: Psychiatry

## 2019-12-31 ENCOUNTER — Other Ambulatory Visit: Payer: Self-pay | Admitting: Psychiatry

## 2019-12-31 DIAGNOSIS — G894 Chronic pain syndrome: Secondary | ICD-10-CM

## 2019-12-31 MED ORDER — PREGABALIN 150 MG PO CAPS: 150.0000 mg | ORAL_CAPSULE | Freq: Three times a day (TID) | ORAL | 0 refills | Status: DC

## 2019-12-31 NOTE — Telephone Encounter (Signed)
Pt's husband called and said that Toni Collins's mom died un expecently and she needs a [facility] [place]. She will bring in forms of the pt assistance in tomorrow

## 2019-12-31 NOTE — Telephone Encounter (Signed)
Refill timing appropriate.  Prescription sent.

## 2020-01-13 ENCOUNTER — Telehealth: Payer: Self-pay | Admitting: Psychiatry

## 2020-01-13 ENCOUNTER — Other Ambulatory Visit: Payer: Self-pay

## 2020-01-13 DIAGNOSIS — F902 Attention-deficit hyperactivity disorder, combined type: Secondary | ICD-10-CM

## 2020-01-13 NOTE — Telephone Encounter (Signed)
Last refill 12/16/2019 Next apt 01/28/20

## 2020-01-13 NOTE — Telephone Encounter (Signed)
Toni Collins called to request refill of her Adderall.  Appt 9/2.  [facility]

## 2020-01-14 ENCOUNTER — Telehealth: Payer: Self-pay | Admitting: Family Medicine

## 2020-01-14 MED ORDER — AMPHETAMINE-DEXTROAMPHETAMINE 30 MG PO TABS: ORAL_TABLET | ORAL | 0 refills | Status: DC

## 2020-01-14 NOTE — Telephone Encounter (Signed)
Pt called to see if she could get [facility] [place]. [facility] of the 10 mg and she is wanting the 5 mg called in and she will take 2 pills.  She will be leaving to go to [place] or in the morning and need the medication  [phone]  Please advise

## 2020-01-14 NOTE — Telephone Encounter (Signed)
Pt called checking status on Adderall refill.

## 2020-01-15 ENCOUNTER — Telehealth (INDEPENDENT_AMBULATORY_CARE_PROVIDER_SITE_OTHER): Payer: Self-pay | Admitting: Family Medicine

## 2020-01-15 ENCOUNTER — Other Ambulatory Visit: Payer: Self-pay | Admitting: Psychiatry

## 2020-01-15 ENCOUNTER — Encounter: Payer: Self-pay | Admitting: Family Medicine

## 2020-01-15 ENCOUNTER — Other Ambulatory Visit: Payer: Self-pay | Admitting: Family Medicine

## 2020-01-15 DIAGNOSIS — F4310 Post-traumatic stress disorder, unspecified: Secondary | ICD-10-CM

## 2020-01-15 DIAGNOSIS — G894 Chronic pain syndrome: Secondary | ICD-10-CM

## 2020-01-15 MED ORDER — OXYCODONE HCL 5 MG PO CAPS: ORAL_CAPSULE | ORAL | 0 refills | Status: DC

## 2020-01-15 NOTE — Progress Notes (Signed)
On call provider contacted in regards to patient's prescription.  Pt seen this afternoon by her PCP.  Rx for oxycodone 5 mg written as pharmacy was out 10 mg pills, however pt sent a MyChart message around 4:50 PM stating the pharmacy was out/had limited supply of the medication.  [facility] [place]    This provider initially agreed to send in 3 days of medication.  This provider contacted patient in regards to prescription as it would be less beneficial for this provider to write a 3-day supply of medication as opposed to patient having a partial refill of the prescription written at today's office visit with PCP.  Pt expressed understanding and will pick up a partial refill on medication written today-oxycodone IR 5 mg.  Pt encouraged to contact office next week.  PDMP reviewed.    Grier Mitts, MD

## 2020-01-15 NOTE — Telephone Encounter (Signed)
Pt has appt today

## 2020-01-15 NOTE — Telephone Encounter (Signed)
Please advise on oxycodone

## 2020-01-15 NOTE — Telephone Encounter (Signed)
Pt scheduled to discuss

## 2020-01-15 NOTE — Telephone Encounter (Signed)
Her last urine drug screen was negative for Oxycodone.  We had recommended office follow (or virtual) to discuss and we have not heard back.  We will need to discuss before any further pain meds can be sent in.

## 2020-01-15 NOTE — Progress Notes (Signed)
Patient ID: Toni Collins, female   DOB: 10-07-1965, 54 y.o.   MRN: [id number]  This visit type was conducted due to national recommendations for restrictions regarding the COVID-19 pandemic in an effort to limit this patient's exposure and mitigate transmission in our community.   Virtual Visit via Video Note  I connected with Toni Collins on 01/15/20 at  3:15 PM EDT by a video enabled telemedicine application and verified that I am speaking with the correct person using two identifiers.  Location patient: home Location provider:work or home office Persons participating in the virtual visit: patient, provider  I discussed the limitations of evaluation and management by telemedicine and the availability of in person appointments. The patient expressed understanding and agreed to proceed.   HPI:  Toni Collins called earlier today requesting, if possible, to send oxycodone 5 mg to replace 10 mg as she states the oxycodone 10 mg on backorder and not available from multiple local pharmacies including her current pharmacy- which is [facility].  She is due for her monthly refill at this time.  She was given her 3 months of refills on 12/16/2019.  In keeping with board regulations we obtained urine drug screen last visit.  This did come back surprisingly negative for oxycodone and we wanted to discuss this with patient today.  She is adamant that she has not missed any doses whatsoever.  She states that she was only able to give a small quantity of urine but we explained that the lab should report "quantity insufficient" if this was not adequate volume.  She states she has not missed any doses and, in fact, is adamant that she took a dose of oxycodone even that morning.   ROS: See pertinent positives and negatives per HPI.  Past Medical History:  Diagnosis Date  . ADHD   . Anxiety   . Colon polyps   . DEPRESSION 09/02/2008  . Fibromyalgia   . GRIEF REACTION 09/30/2009  . INSOMNIA, CHRONIC  09/02/2008  . PREDIABETES 03/10/2009    Past Surgical History:  Procedure Laterality Date  . ABDOMINAL HYSTERECTOMY  2002   TAH  . Toni Collins, 2000   x4  . TMJ ARTHROPLASTY     x2    Family History  Problem Relation Age of Onset  . Arthritis Mother        rhematiod  . Diabetes Maternal Grandmother   . Depression Neg Hx        family    SOCIAL HX: non-smoker,  No alcohol use.   Current Outpatient Medications:  .  amphetamine-dextroamphetamine (ADDERALL) 30 MG tablet, Take 1 tab po q am and then 1/2 tab po BID., Disp: 60 tablet, Rfl: 0 .  ARIPiprazole (ABILIFY) 5 MG tablet, TAKE 1 TABLET BY MOUTH EVERY DAY, Disp: 90 tablet, Rfl: 0 .  cloNIDine (CATAPRES) 0.1 MG tablet, Take 1 tablet (0.1 mg total) by mouth 2 (two) times daily., Disp: 60 tablet, Rfl: 2 .  DULoxetine (CYMBALTA) 30 MG capsule, TAKE 1 CAPSULE BY MOUTH EVERY DAY, Disp: 90 capsule, Rfl: 2 .  nitroGLYCERIN (NITRODUR - DOSED IN MG/24 HR) 0.2 mg/hr patch, APPLY 1/4 OF A PATCH TO SKIN ONCE DAILY., Disp: 30 patch, Rfl: 0 .  ondansetron (ZOFRAN-ODT) 4 MG disintegrating tablet, DISSOLVE/TAKE 1 TABLET BY MOUTH EVERY 8 HOURS AS NEEDED FOR NAUSEA AND VOMITING, Disp: 20 tablet, Rfl: 0 .  oxycodone (OXY-IR) 5 MG capsule, Take two tablets by mouth every 6 hours, Disp: 240  capsule, Rfl: 0 .  pantoprazole (PROTONIX) 40 MG tablet, Take 1 tablet (40 mg total) by mouth daily., Disp: 30 tablet, Rfl: 1 .  predniSONE (DELTASONE) 5 MG tablet, Take 1 tablet (5 mg total) by mouth daily with breakfast. 20 mg for 5 days, 15mg  for 5 days, 10 mg for 5 days and 5 mg for 5 days, Disp: 50 tablet, Rfl: 0 .  predniSONE (DELTASONE) 50 MG tablet, Take 1 tablet (50 mg total) by mouth daily., Disp: 7 tablet, Rfl: 0 .  pregabalin (LYRICA) 150 MG capsule, Take 1 capsule (150 mg total) by mouth in the morning, at noon, and at bedtime., Disp: 270 capsule, Rfl: 0 .  QUEtiapine (SEROQUEL) 25 MG tablet, TAKE 3 TABLETS (75 MG TOTAL) BY  MOUTH AT BEDTIME., Disp: 270 tablet, Rfl: 0 .  sertraline (ZOLOFT) 100 MG tablet, TAKE 2 TABLETS BY MOUTH EVERY DAY, Disp: 180 tablet, Rfl: 1  EXAM:  VITALS per patient if applicable:  GENERAL: alert, oriented, appears well and in no acute distress  HEENT: atraumatic, conjunttiva clear, no obvious abnormalities on inspection of external nose and ears  NECK: normal movements of the head and neck  LUNGS: on inspection no signs of respiratory distress, breathing rate appears normal, no obvious gross SOB, gasping or wheezing  CV: no obvious cyanosis  MS: moves all visible extremities without noticeable abnormality  PSYCH/NEURO: pleasant and cooperative, no obvious depression or anxiety, speech and thought processing grossly intact  ASSESSMENT AND PLAN:  Discussed the following assessment and plan:  Chronic back and fibromyalgia pains.  Discrepancy with urine drug screen as above which came back negative in the setting of regular oxycodone 10 mg dosing  -We will discuss with our toxicology lab to see if they have any valid explanations  -We did agree to send in 5 mg as 10 mg on back order until we can further clarify     I discussed the assessment and treatment plan with the patient. The patient was provided an opportunity to ask questions and all were answered. The patient agreed with the plan and demonstrated an understanding of the instructions.   The patient was advised to call back or seek an in-person evaluation if the symptoms worsen or if the condition fails to improve as anticipated.     Carolann Littler, MD

## 2020-01-18 ENCOUNTER — Encounter: Payer: Self-pay | Admitting: Family Medicine

## 2020-01-19 ENCOUNTER — Encounter: Payer: Self-pay | Admitting: Family Medicine

## 2020-01-20 ENCOUNTER — Telehealth: Payer: Self-pay | Admitting: *Deleted

## 2020-01-20 ENCOUNTER — Telehealth: Payer: Self-pay | Admitting: Family Medicine

## 2020-01-20 MED ORDER — OXYCODONE HCL 10 MG PO TABS: ORAL_TABLET | ORAL | 0 refills | Status: DC

## 2020-01-20 NOTE — Telephone Encounter (Signed)
Pt is calling to see if she can get her r Oxycodone refilled at [facility] [place].  The phone # [phone].  Pt is in a lot of pain and is needing the medication  Please call the pt to follow up with her. She is saying no one is responding to her request.  Please advise

## 2020-01-20 NOTE — Telephone Encounter (Signed)
Toni Collins is okay with a Rx sent in.  She said she called around and [facility] [place]  [facility] 10 mg.  she only got 32 of the 5 mg.  Okay to pend the refill? if so what dosage, directions, amount?

## 2020-01-20 NOTE — Telephone Encounter (Signed)
I have sent in to pharmacy remainder of this month which was 90 tablets for the oxycodone 10 mg to [facility] [place].

## 2020-01-21 NOTE — Telephone Encounter (Signed)
Patient's refill has been sent.

## 2020-01-22 NOTE — Telephone Encounter (Signed)
Duplicate message. 

## 2020-01-28 ENCOUNTER — Ambulatory Visit: Payer: Self-pay | Admitting: Psychiatry

## 2020-02-06 ENCOUNTER — Encounter: Payer: Self-pay | Admitting: Family Medicine

## 2020-02-10 ENCOUNTER — Ambulatory Visit (INDEPENDENT_AMBULATORY_CARE_PROVIDER_SITE_OTHER): Payer: Self-pay | Admitting: Psychiatry

## 2020-02-10 ENCOUNTER — Other Ambulatory Visit: Payer: Self-pay

## 2020-02-10 ENCOUNTER — Encounter: Payer: Self-pay | Admitting: Psychiatry

## 2020-02-10 DIAGNOSIS — F39 Unspecified mood [affective] disorder: Secondary | ICD-10-CM

## 2020-02-10 DIAGNOSIS — F902 Attention-deficit hyperactivity disorder, combined type: Secondary | ICD-10-CM

## 2020-02-10 DIAGNOSIS — G894 Chronic pain syndrome: Secondary | ICD-10-CM

## 2020-02-10 DIAGNOSIS — F411 Generalized anxiety disorder: Secondary | ICD-10-CM

## 2020-02-10 DIAGNOSIS — F4310 Post-traumatic stress disorder, unspecified: Secondary | ICD-10-CM

## 2020-02-10 DIAGNOSIS — F5105 Insomnia due to other mental disorder: Secondary | ICD-10-CM

## 2020-02-10 MED ORDER — AMPHETAMINE-DEXTROAMPHETAMINE 30 MG PO TABS: ORAL_TABLET | ORAL | 0 refills | Status: DC

## 2020-02-10 MED ORDER — SERTRALINE HCL 100 MG PO TABS: 200.0000 mg | ORAL_TABLET | Freq: Every day | ORAL | 1 refills | Status: DC

## 2020-02-10 MED ORDER — PREGABALIN 150 MG PO CAPS: 150.0000 mg | ORAL_CAPSULE | Freq: Three times a day (TID) | ORAL | 0 refills | Status: DC

## 2020-02-10 MED ORDER — QUETIAPINE FUMARATE 25 MG PO TABS: 75.0000 mg | ORAL_TABLET | Freq: Every day | ORAL | 0 refills | Status: DC
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
## 2020-02-16 ENCOUNTER — Other Ambulatory Visit: Payer: Self-pay

## 2020-02-16 DIAGNOSIS — G894 Chronic pain syndrome: Secondary | ICD-10-CM

## 2020-02-16 MED ORDER — PREGABALIN 150 MG PO CAPS: 150.0000 mg | ORAL_CAPSULE | Freq: Three times a day (TID) | ORAL | 3 refills | Status: DC

## 2020-02-17 ENCOUNTER — Encounter: Payer: Self-pay | Admitting: Family Medicine

## 2020-02-19 MED ORDER — OXYCODONE HCL 10 MG PO TABS: ORAL_TABLET | ORAL | 0 refills | Status: DC

## 2020-02-19 NOTE — Telephone Encounter (Signed)
Patient return call to speak to Dr. Elease Hashimoto. He asked me to relay the information below, she verbalized understanding. Appointment scheduled for Friday, 02/26/20 at 0830 x 30 minute slot.  These notes are very long and complicated to follow. She had recent drug screen negative for Oxycodone. There was sufficient quantity of urine after checking with lab. We need to do the following:   1) set up follow up next week- 30 minutes and I want to discuss tapering program vs referral to pain management if she would like   2) I will send in Oxycodone enough until we can discuss, but would suggest she go ahead and reduce from QID to TID.

## 2020-02-22 ENCOUNTER — Ambulatory Visit (INDEPENDENT_AMBULATORY_CARE_PROVIDER_SITE_OTHER): Payer: Self-pay | Admitting: Family Medicine

## 2020-02-22 ENCOUNTER — Encounter: Payer: Self-pay | Admitting: Family Medicine

## 2020-02-22 ENCOUNTER — Other Ambulatory Visit: Payer: Self-pay

## 2020-02-22 VITALS — BP 110/72 | HR 92 | Ht 62.0 in | Wt 156.0 lb

## 2020-02-22 DIAGNOSIS — G8929 Other chronic pain: Secondary | ICD-10-CM

## 2020-02-22 DIAGNOSIS — M25512 Pain in left shoulder: Secondary | ICD-10-CM

## 2020-02-22 DIAGNOSIS — M25511 Pain in right shoulder: Secondary | ICD-10-CM

## 2020-02-22 NOTE — Progress Notes (Signed)
   I, Toni Collins, LAT, ATC, am serving as scribe for Dr. Lynne Collins.  Toni Collins is a 54 y.o. female who presents to [place] at [facility] today for R shoulder pain.  She last saw Dr. Tamala Collins on 10/07/19 for B shoulder pain, R>L, and had a R GHJ injection.  She was advised to con't to take Lyrica and was prescribed nitroglycerin patches.  Since her last visit, pt reports arm stiffness in the morning. Is unable to get past 90 degrees when performing horizontal abduction. Pain located in anterior aspect of shoulder. Pain is worse at night but also notes pain with texting on phone. Using Biofreeze at night as well as oxycodone 10mg , 4x a day.   Pertinent review of systems: No fevers or chills  Relevant historical information: Chronic pain syndrome.  Bilateral shoulder pain history.  Current grief reaction.  Fibromyalgia.  Does not have health insurance.   Exam:  BP 110/72   Pulse 92   Ht 5\' 2"  (1.575 m)   Wt 156 lb (70.8 kg)   SpO2 99%   BMI 28.53 kg/m  General: Well Developed, well nourished, and in no acute distress.   MSK:  Right shoulder decreased muscle bulk deltoid otherwise normal-appearing. Decreased motion to abduction normal external and internal rotation. Strength intact within limits of motion. Positive Hawkins and Neer's test.  Left shoulder were normal-appearing with less decreased muscle bulk. Normal motion. Intact strength. Positive Hawkins and Neer's test.  Pulses cap refill and sensation are intact distally.    Lab and Radiology Results  Right posterior approach glenohumeral joint shoulder injection. Indication: Shoulder pain. Consent obtained and timeout performed. Skin sterilized with isopropyl alcohol. Cold spray applied. 22-gauge spinal needle used to access the glenohumeral joint. 40 mg of Kenalog and 2 mL of Marcaine injected achieving good anesthesia. Patient tolerated procedure well.  Left posterior approach  glenohumeral joint shoulder injection Indication: Shoulder pain. Consent obtained and timeout performed. Skin sterilized with isopropyl alcohol. Cold spray applied. 22-gauge spinal needle used to access the glenohumeral joint. 40 mg of Kenalog and 2 mL of Marcaine injected achieving good anesthesia. Patient tolerated the procedure well.    Assessment and Plan: 54 y.o. female with bilateral shoulder pain.  Etiology somewhat unclear as patient has not had complete work-up.  She does not have health insurance which limits her ability to proceed with physical therapy treatment or further diagnostic imaging tests such as MRI.  Suspect major cause of pain is rotator cuff tendinopathy although she is done quite well with glenohumeral injections in the past.  Plan to proceed with glenohumeral injections as above.  Additionally provided information for [place] charity program.  She may be eligible for this which would certainly reduce her healthcare costs and allow better treatment and imaging such as physical therapy and MRI.  She also notes that she is currently applying for so security disability which would also allow better healthcare treatment for her as well.      Discussed warning signs or symptoms. Please see discharge instructions. Patient expresses understanding.   The above documentation has been reviewed and is accurate and complete Toni Collins, M.D.

## 2020-02-22 NOTE — Patient Instructions (Addendum)
You had B shoulder injections today.  Call or go to the ER if you develop a large red swollen joint with extreme pain or oozing puss.   Come back as needed.  Apply for [facility]

## 2020-02-26 ENCOUNTER — Other Ambulatory Visit: Payer: Self-pay

## 2020-02-26 ENCOUNTER — Ambulatory Visit (INDEPENDENT_AMBULATORY_CARE_PROVIDER_SITE_OTHER): Payer: Self-pay | Admitting: Family Medicine

## 2020-02-26 ENCOUNTER — Telehealth: Payer: Self-pay | Admitting: Psychiatry

## 2020-02-26 ENCOUNTER — Encounter: Payer: Self-pay | Admitting: Family Medicine

## 2020-02-26 VITALS — BP 138/88 | HR 85 | Temp 98.2°F | Ht 62.0 in | Wt 155.2 lb

## 2020-02-26 DIAGNOSIS — G894 Chronic pain syndrome: Secondary | ICD-10-CM

## 2020-02-26 NOTE — Patient Instructions (Signed)
Reduce the Oxycodone to one three times daily for 2 weeks  Then decrease to Oxycodone 5 mg three times daily for 2 weeks  Then decrease to Oxycodone 5 mg twice daily for one week and then 5 mg one daily for one week and then discontinue.

## 2020-02-26 NOTE — Telephone Encounter (Signed)
Please review

## 2020-02-26 NOTE — Telephone Encounter (Signed)
Pt called crying asking for work in apt. Put on canc list for next week. Pt stated she just learned of some medical issues and having a hard time dealing with it. Ask for return call @ [phone]. Apt 11/16

## 2020-02-26 NOTE — Progress Notes (Signed)
Established Patient Office Visit  Subjective:  Patient ID: Toni Collins, female    DOB: 06-15-1965  Age: 54 y.o. MRN: [id number]  CC:  Chief Complaint  Patient presents with  . Medication Management    Discuss tapering pain medication    HPI Toni Collins presents for discussion regarding tapering of oxycodone.  She has been on this for many years for fibromyalgia pain.  She came to me on opioids several years ago.  She had tried multiple medications in the past that were nonopioid-based that did not seem to work (Tylenol, NSAIDS, Tramadol, muscle relaxers, Tricyclics, topical medications).  She came in recently and had urine drug screen which came back completely negative for oxycodone.  This was positive for amphetamine(she takes Adderall per psychiatry).  There was no valid explanation for her negative oxycodone result- such as recently missing doses.  My recommendation at that point was to consider tapering her off the oxycodone vs our repeated offer to refer her pain management- which she has declined..  Past Medical History:  Diagnosis Date  . ADHD   . Anxiety   . Colon polyps   . DEPRESSION 09/02/2008  . Fibromyalgia   . GRIEF REACTION 09/30/2009  . INSOMNIA, CHRONIC 09/02/2008  . PREDIABETES 03/10/2009    Past Surgical History:  Procedure Laterality Date  . ABDOMINAL HYSTERECTOMY  2002   TAH  . [place], 2000   x4  . TMJ ARTHROPLASTY     x2    Family History  Problem Relation Age of Onset  . Arthritis Mother        rhematiod  . Diabetes Maternal Grandmother   . Depression Neg Hx        family    Social History   Socioeconomic History  . Marital status: Married    Spouse name: Not on file  . Number of children: Not on file  . Years of education: Not on file  . Highest education level: Not on file  Occupational History  . Not on file  Tobacco Use  . Smoking status: Never Smoker  . Smokeless tobacco: Never Used   Vaping Use  . Vaping Use: Never used  Substance and Sexual Activity  . Alcohol use: No  . Drug use: Not Currently  . Sexual activity: Not on file  Other Topics Concern  . Not on file  Social History Narrative  . Not on file   Social Determinants of Health   Financial Resource Strain:   . Difficulty of Paying Living Expenses: Not on file  Food Insecurity:   . Worried About Charity fundraiser in the Last Year: Not on file  . Ran Out of Food in the Last Year: Not on file  Transportation Needs:   . Lack of Transportation (Medical): Not on file  . Lack of Transportation (Non-Medical): Not on file  Physical Activity:   . Days of Exercise per Week: Not on file  . Minutes of Exercise per Session: Not on file  Stress:   . Feeling of Stress : Not on file  Social Connections:   . Frequency of Communication with Friends and Family: Not on file  . Frequency of Social Gatherings with Friends and Family: Not on file  . Attends Religious Services: Not on file  . Active Member of Clubs or Organizations: Not on file  . Attends Archivist Meetings: Not on file  . Marital Status: Not on file  Intimate  Partner Violence:   . Fear of Current or Ex-Partner: Not on file  . Emotionally Abused: Not on file  . Physically Abused: Not on file  . Sexually Abused: Not on file    Outpatient Medications Prior to Visit  Medication Sig Dispense Refill  . amphetamine-dextroamphetamine (ADDERALL) 30 MG tablet Take 1 tab po q am and then 1/2 tab po BID. 60 tablet 0  . nitroGLYCERIN (NITRODUR - DOSED IN MG/24 HR) 0.2 mg/hr patch APPLY 1/4 OF A PATCH TO SKIN ONCE DAILY. 30 patch 0  . ondansetron (ZOFRAN-ODT) 4 MG disintegrating tablet DISSOLVE/TAKE 1 TABLET BY MOUTH EVERY 8 HOURS AS NEEDED FOR NAUSEA AND VOMITING 20 tablet 0  . Oxycodone HCl 10 MG TABS Take 1 tablet by mouth every 8 hours 90 tablet 0  . oxyCODONE-acetaminophen (PERCOCET) 10-325 MG tablet Take by mouth.    . pantoprazole (PROTONIX)  40 MG tablet Take 1 tablet (40 mg total) by mouth daily. 30 tablet 1  . predniSONE (DELTASONE) 50 MG tablet Take 1 tablet (50 mg total) by mouth daily. 7 tablet 0  . pregabalin (LYRICA) 150 MG capsule Take 1 capsule (150 mg total) by mouth in the morning, at noon, and at bedtime. 270 capsule 3  . QUEtiapine (SEROQUEL) 25 MG tablet Take 3 tablets (75 mg total) by mouth at bedtime. 270 tablet 0  . sertraline (ZOLOFT) 100 MG tablet Take 2 tablets (200 mg total) by mouth daily. 180 tablet 1  . predniSONE (DELTASONE) 5 MG tablet Take 1 tablet (5 mg total) by mouth daily with breakfast. 20 mg for 5 days, 15mg  for 5 days, 10 mg for 5 days and 5 mg for 5 days (Patient not taking: Reported on 02/26/2020) 50 tablet 0   No facility-administered medications prior to visit.    No Known Allergies  ROS Review of Systems Not obtained.    Objective:    Physical Exam Vitals reviewed.  Constitutional:      Comments: She is emotionally upset with the discussion of tapering off the Oxycodone.  Neurological:     Mental Status: She is alert.     BP 138/88   Pulse 85   Temp 98.2 F (36.8 C) (Oral)   Ht 5\' 2"  (1.575 m)   Wt 155 lb 3.2 oz (70.4 kg)   SpO2 95%   BMI 28.39 kg/m  Wt Readings from Last 3 Encounters:  02/26/20 155 lb 3.2 oz (70.4 kg)  02/22/20 156 lb (70.8 kg)  12/16/19 148 lb 14.4 oz (67.5 kg)     Health Maintenance Due  Topic Date Due  . Hepatitis C Screening  Never done  . COVID-19 Vaccine (1) Never done  . HIV Screening  Never done  . PAP SMEAR-Modifier  Never done  . MAMMOGRAM  04/20/2018  . COLONOSCOPY  Never done  . INFLUENZA VACCINE  12/27/2019    There are no preventive care reminders to display for this patient.  Lab Results  Component Value Date   TSH 3.45 06/03/2015   Lab Results  Component Value Date   WBC 5.2 12/14/2015   HGB 13.2 12/14/2015   HCT 39.2 12/14/2015   MCV 84.9 12/14/2015   PLT 222.0 12/14/2015   Lab Results  Component Value Date   NA  139 12/14/2015   K 4.0 12/14/2015   CO2 25 12/14/2015   GLUCOSE 89 12/14/2015   BUN 16 12/14/2015   CREATININE 0.78 12/14/2015   BILITOT 0.6 12/14/2015   ALKPHOS 83 12/14/2015  AST 19 12/14/2015   ALT 19 12/14/2015   PROT 7.4 12/14/2015   ALBUMIN 4.2 12/14/2015   CALCIUM 9.6 12/14/2015   GFR 83.91 12/14/2015   Lab Results  Component Value Date   CHOL 204 (H) 06/03/2015   Lab Results  Component Value Date   HDL 63.10 06/03/2015   Lab Results  Component Value Date   LDLCALC 127 (H) 06/03/2015   Lab Results  Component Value Date   TRIG 66.0 06/03/2015   Lab Results  Component Value Date   CHOLHDL 3 06/03/2015   No results found for: HGBA1C    Assessment & Plan:   Chronic pain syndrome.  Patient on chronic oxycodone with negative recent drug screen and no valid explanation.  -We had a long discussion with patient regarding her drug screen.  She was obviously very emotionally upset with the thought of coming off medication.  We did not have a valid explanation for her negative result. -We have given her a tapering regimen for oxycodone-over the next few weeks -Did give her alternative of pain management referral but she states that she cannot afford that.  She declines referral at this time.  No orders of the defined types were placed in this encounter.   Follow-up: No follow-ups on file.    Carolann Littler, MD

## 2020-03-02 NOTE — Telephone Encounter (Signed)
Spoke with patient at length.  Even though her oxycodone came back negative for cutoff of less than 100 ng/mL I spoke to the toxicology lab director and they explained that it is certainly possible that the patient was still taking this regularly (for example for fast metabolizer's) this could be less than the cutoff.  We did not order a confirmatory test which would have picked up any traces of oxycodone.. In the absence of a confirmatory test and with patient adamantly denying missing any doses we will give her benefit of doubt.  For any future testing if cut off limit is negative we will order confirmatory test to look for any traces of oxycodone.  She will resume her usual dose of 4 times daily medication

## 2020-03-03 ENCOUNTER — Ambulatory Visit (INDEPENDENT_AMBULATORY_CARE_PROVIDER_SITE_OTHER): Payer: Self-pay | Admitting: Psychiatry

## 2020-03-03 ENCOUNTER — Encounter: Payer: Self-pay | Admitting: Psychiatry

## 2020-03-03 ENCOUNTER — Other Ambulatory Visit: Payer: Self-pay

## 2020-03-03 DIAGNOSIS — F4310 Post-traumatic stress disorder, unspecified: Secondary | ICD-10-CM

## 2020-03-03 DIAGNOSIS — G894 Chronic pain syndrome: Secondary | ICD-10-CM

## 2020-03-03 DIAGNOSIS — F902 Attention-deficit hyperactivity disorder, combined type: Secondary | ICD-10-CM

## 2020-03-03 DIAGNOSIS — F39 Unspecified mood [affective] disorder: Secondary | ICD-10-CM

## 2020-03-03 DIAGNOSIS — F411 Generalized anxiety disorder: Secondary | ICD-10-CM

## 2020-03-03 DIAGNOSIS — F5105 Insomnia due to other mental disorder: Secondary | ICD-10-CM

## 2020-03-03 MED ORDER — CARIPRAZINE HCL 3 MG PO CAPS: 3.0000 mg | ORAL_CAPSULE | Freq: Every day | ORAL | 0 refills | Status: DC

## 2020-03-03 MED ORDER — AMPHETAMINE-DEXTROAMPHETAMINE 30 MG PO TABS: ORAL_TABLET | ORAL | 0 refills | Status: DC

## 2020-03-03 MED ORDER — AMPHETAMINE-DEXTROAMPHETAMINE 20 MG PO TABS: 20.0000 mg | ORAL_TABLET | Freq: Three times a day (TID) | ORAL | 0 refills | Status: DC

## 2020-03-03 NOTE — Telephone Encounter (Signed)
Seen today.  Stabilized
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
## 2020-03-07 ENCOUNTER — Encounter: Payer: Self-pay | Admitting: Family Medicine

## 2020-03-07 MED ORDER — PANTOPRAZOLE SODIUM 40 MG PO TBEC: 40.0000 mg | DELAYED_RELEASE_TABLET | Freq: Every day | ORAL | 5 refills | Status: DC

## 2020-03-08 NOTE — Telephone Encounter (Signed)
Looks like she will be due for refill on Oxycodone on 03-14-20  She got #90 on 02-19-20 Took 4 daily unitl 10-1 (#24) Took 3 daily until 03-02-20 (#15) Started back 4 daily on 03-02-20

## 2020-03-11 ENCOUNTER — Encounter: Payer: Self-pay | Admitting: Family Medicine

## 2020-03-13 ENCOUNTER — Telehealth (INDEPENDENT_AMBULATORY_CARE_PROVIDER_SITE_OTHER): Payer: Self-pay | Admitting: Family Medicine

## 2020-03-13 ENCOUNTER — Encounter: Payer: Self-pay | Admitting: Family Medicine

## 2020-03-13 ENCOUNTER — Other Ambulatory Visit: Payer: Self-pay | Admitting: Family Medicine

## 2020-03-13 MED ORDER — OXYCODONE HCL 10 MG PO TABS
ORAL_TABLET | ORAL | 0 refills | Status: DC
Start: 2020-03-13 — End: 2020-03-29

## 2020-03-13 MED ORDER — OXYCODONE HCL 10 MG PO TABS
ORAL_TABLET | ORAL | 0 refills | Status: DC
Start: 2020-03-13 — End: 2020-03-13

## 2020-03-13 NOTE — Telephone Encounter (Signed)
Refills have been sent.  

## 2020-03-13 NOTE — Progress Notes (Signed)
On call note.  Patient had rx for Oxycodone sent in earlier today.  Correct number per month sent in at 120, but was sent in for TID and not QID and she was unable to get filled.  Correct Rx will be sent in.

## 2020-03-17 ENCOUNTER — Encounter: Payer: Self-pay | Admitting: Psychiatry

## 2020-03-18 ENCOUNTER — Ambulatory Visit: Payer: Self-pay | Admitting: Family Medicine

## 2020-03-28 ENCOUNTER — Encounter: Payer: Self-pay | Admitting: Family Medicine

## 2020-03-29 ENCOUNTER — Encounter: Payer: Self-pay | Admitting: Family Medicine

## 2020-03-29 ENCOUNTER — Telehealth (INDEPENDENT_AMBULATORY_CARE_PROVIDER_SITE_OTHER): Payer: Self-pay | Admitting: Family Medicine

## 2020-03-29 ENCOUNTER — Telehealth: Payer: Self-pay | Admitting: Family Medicine

## 2020-03-29 VITALS — Ht 62.0 in | Wt 148.0 lb

## 2020-03-29 DIAGNOSIS — G894 Chronic pain syndrome: Secondary | ICD-10-CM

## 2020-03-29 MED ORDER — OXYCODONE HCL 10 MG PO TABS
ORAL_TABLET | ORAL | 0 refills | Status: DC
Start: 2020-03-29 — End: 2020-04-26

## 2020-03-29 NOTE — Progress Notes (Signed)
Patient ID: Toni Collins, female   DOB: Jun 14, 1965, 54 y.o.   MRN: [id number]  This visit type was conducted due to national recommendations for restrictions regarding the COVID-19 pandemic in an effort to limit this patient's exposure and mitigate transmission in our community.   Virtual Visit via Telephone Note  I connected with Toni Collins on 03/29/20 at  9:15 AM EDT by telephone and verified that I am speaking with the correct person using two identifiers.  We again had difficulties connecting with our current video platform through [employer] and this had to be converted to a phone note.   I discussed the limitations, risks, security and privacy concerns of performing an evaluation and management service by telephone and the availability of in person appointments. I also discussed with the patient that there may be a patient responsible charge related to this service. The patient expressed understanding and agreed to proceed.  Location patient: home Location provider: work or home office Participants present for the call: patient, provider Patient did not have a visit in the prior 7 days to address this/these issue(s).   History of Present Illness: Patient called about her pain medication.  She is on oxycodone 10 mg 4 times daily.  She has been on this for several years.  She had last refill for 72 tablets on 17 October.  She will be due tomorrow.  Her pharmacy has had difficulty getting quantity sufficient for the month which is 120 tablets.  She states she called and confirmed with her pharmacist this morning that they do have sufficient supply at this time.  She has recently had severe shoulder pain.  She has seen sports medicine.  She had a injection which helped briefly but her pain started increasing about 3 to 4 days ago.  She has chronic severe fibromyalgia pain and some chronic back pain.  Past Medical History:  Diagnosis Date  . ADHD   . Anxiety   . Colon polyps   .  DEPRESSION 09/02/2008  . Fibromyalgia   . GRIEF REACTION 09/30/2009  . INSOMNIA, CHRONIC 09/02/2008  . PREDIABETES 03/10/2009   Past Surgical History:  Procedure Laterality Date  . ABDOMINAL HYSTERECTOMY  2002   TAH  . [place], 2000   x4  . TMJ ARTHROPLASTY     x2    reports that she has never smoked. She has never used smokeless tobacco. She reports previous drug use. She reports that she does not drink alcohol. family history includes Arthritis in her mother; Diabetes in her maternal grandmother. No Known Allergies    Observations/Objective: Patient sounds cheerful and well on the phone. I do not appreciate any SOB. Speech and thought processing are grossly intact. Patient reported vitals:  Assessment and Plan: Chronic pain syndrome.  Overall stable.  Patient requesting refills of oxycodone at this time.  She will be due tomorrow.  She had to get partial prescription on 17 October  -Refill oxycodone 10 mg every 6 hours #120 -We plan to get her back on 3 months in office follow-up.  Follow Up Instructions:  -In office within 2 to 3 months   [id number] 5-10 [id number] 11-20 [id number] 21-30 I did not refer this patient for an OV in the next 24 hours for this/these issue(s).  I discussed the assessment and treatment plan with the patient. The patient was provided an opportunity to ask questions and all were answered. The patient agreed with the plan and demonstrated an understanding of  the instructions.   The patient was advised to call back or seek an in-person evaluation if the symptoms worsen or if the condition fails to improve as anticipated.  I provided 14 minutes of non-face-to-face time during this encounter.   Carolann Littler, MD

## 2020-03-30 ENCOUNTER — Telehealth: Payer: Self-pay | Admitting: Psychiatry

## 2020-03-30 NOTE — Telephone Encounter (Signed)
Pt called and said that she needs a refill of her adderall 20 mg which is due on Friday [facility] [place]

## 2020-03-31 ENCOUNTER — Other Ambulatory Visit: Payer: Self-pay

## 2020-03-31 DIAGNOSIS — F902 Attention-deficit hyperactivity disorder, combined type: Secondary | ICD-10-CM

## 2020-03-31 NOTE — Telephone Encounter (Signed)
Last refill  03/03/2020 Pended to be filled on 04/01/2020 Dr. Clovis Pu will review and send

## 2020-04-01 MED ORDER — AMPHETAMINE-DEXTROAMPHETAMINE 20 MG PO TABS: 20.0000 mg | ORAL_TABLET | Freq: Three times a day (TID) | ORAL | 0 refills | Status: DC

## 2020-04-12 ENCOUNTER — Ambulatory Visit (INDEPENDENT_AMBULATORY_CARE_PROVIDER_SITE_OTHER): Payer: Self-pay | Admitting: Psychiatry

## 2020-04-12 ENCOUNTER — Other Ambulatory Visit: Payer: Self-pay

## 2020-04-12 ENCOUNTER — Encounter: Payer: Self-pay | Admitting: Psychiatry

## 2020-04-12 DIAGNOSIS — G894 Chronic pain syndrome: Secondary | ICD-10-CM

## 2020-04-12 DIAGNOSIS — F902 Attention-deficit hyperactivity disorder, combined type: Secondary | ICD-10-CM

## 2020-04-12 DIAGNOSIS — F411 Generalized anxiety disorder: Secondary | ICD-10-CM

## 2020-04-12 DIAGNOSIS — F5105 Insomnia due to other mental disorder: Secondary | ICD-10-CM

## 2020-04-12 DIAGNOSIS — F4310 Post-traumatic stress disorder, unspecified: Secondary | ICD-10-CM

## 2020-04-12 DIAGNOSIS — F39 Unspecified mood [affective] disorder: Secondary | ICD-10-CM
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
## 2020-04-25 ENCOUNTER — Encounter: Payer: Self-pay | Admitting: Family Medicine

## 2020-04-26 ENCOUNTER — Encounter: Payer: Self-pay | Admitting: Family Medicine

## 2020-04-26 MED ORDER — OXYCODONE HCL 10 MG PO TABS
ORAL_TABLET | ORAL | 0 refills | Status: DC
Start: 2020-04-26 — End: 2020-05-26

## 2020-04-26 NOTE — Telephone Encounter (Signed)
I refilled the medication.   There have been almost monthly requests to refill early and we cannot continue to do so.  I am aware of notes regarding required travel.

## 2020-04-29 ENCOUNTER — Telehealth: Payer: Self-pay | Admitting: Psychiatry

## 2020-04-29 ENCOUNTER — Other Ambulatory Visit: Payer: Self-pay | Admitting: Psychiatry

## 2020-04-29 DIAGNOSIS — F902 Attention-deficit hyperactivity disorder, combined type: Secondary | ICD-10-CM

## 2020-04-29 MED ORDER — AMPHETAMINE-DEXTROAMPHETAMINE 20 MG PO TABS: 20.0000 mg | ORAL_TABLET | Freq: Three times a day (TID) | ORAL | 0 refills | Status: DC

## 2020-04-29 NOTE — Telephone Encounter (Signed)
Pt is going to [place] to finalize everything with the death of her mom. She will run out of her adderall on Sunday and so she is leaving today around 4 pm. If you want to wait and send it to a pharmacy in [place] just let me know and I will call her and get that information. She wants you to know that she is not trying to get it early but she won't be back home until tuesday

## 2020-04-29 NOTE — Telephone Encounter (Signed)
Please let her know I sent in the refill for Adderall to her local pharmacy.

## 2020-05-25 ENCOUNTER — Ambulatory Visit: Payer: Self-pay | Admitting: Family Medicine

## 2020-05-26 ENCOUNTER — Telehealth: Payer: Self-pay

## 2020-05-26 ENCOUNTER — Encounter: Payer: Self-pay | Admitting: Family Medicine

## 2020-05-26 MED ORDER — OXYCODONE HCL 10 MG PO TABS
ORAL_TABLET | ORAL | 0 refills | Status: DC
Start: 2020-05-26 — End: 2020-05-30

## 2020-05-26 NOTE — Telephone Encounter (Signed)
I sent in the refill for the Oxycodone.

## 2020-05-26 NOTE — Telephone Encounter (Signed)
New message      1. Which medications need to be refilled? (please list name of each medication and dose if known) Oxycodone HCl 10 MG TABS  2. Which pharmacy/location (including street and city if local pharmacy) is medication to be sent to? [facility]   3. Upcoming appt on Jan 3rd @ 11:30am

## 2020-05-30 ENCOUNTER — Ambulatory Visit: Payer: Self-pay | Admitting: Family Medicine

## 2020-05-30 ENCOUNTER — Other Ambulatory Visit: Payer: Self-pay

## 2020-05-30 ENCOUNTER — Ambulatory Visit (INDEPENDENT_AMBULATORY_CARE_PROVIDER_SITE_OTHER): Payer: Self-pay | Admitting: Family Medicine

## 2020-05-30 ENCOUNTER — Encounter: Payer: Self-pay | Admitting: Family Medicine

## 2020-05-30 VITALS — BP 110/80 | HR 80 | Temp 98.1°F | Wt 159.1 lb

## 2020-05-30 DIAGNOSIS — G894 Chronic pain syndrome: Secondary | ICD-10-CM

## 2020-05-30 DIAGNOSIS — M797 Fibromyalgia: Secondary | ICD-10-CM

## 2020-05-30 DIAGNOSIS — M25511 Pain in right shoulder: Secondary | ICD-10-CM

## 2020-05-30 MED ORDER — OXYCODONE HCL 10 MG PO TABS: ORAL_TABLET | ORAL | 0 refills | Status: DC

## 2020-05-30 NOTE — Progress Notes (Signed)
Established Patient Office Visit  Subjective:  Patient ID: Toni Collins, female    DOB: Oct 22, 1965  Age: 55 y.o. MRN: [id number]  CC: No chief complaint on file.   HPI RYLA CAUTHON presents for chronic pain management follow-up.  She has history of fibromyalgia.  She is on fairly high-dose Lyrica which has helped some.  She has been on Cymbalta in the past which did not help.  She has chronic musculoskeletal pain and has been tried on multiple things in the past including nonsteroidals, Tylenol, tramadol, Cymbalta, tricyclic's without relief.  She has recently had increasing right shoulder pain and has been seen by sports medicine.  She has follow-up with sports medicine in 2 days.  She has had limited temporary improvement with steroid injections.  She currently has no insurance.  She has been on oxycodone for several years.  She states she has frequent breakthrough pain and poor control even with 10 mg four times daily.  We have several times discussed possible referral to chronic pain management but she is reluctant until she can get coverage.  Past Medical History:  Diagnosis Date  . ADHD   . Anxiety   . Colon polyps   . DEPRESSION 09/02/2008  . Fibromyalgia   . GRIEF REACTION 09/30/2009  . INSOMNIA, CHRONIC 09/02/2008  . PREDIABETES 03/10/2009    Past Surgical History:  Procedure Laterality Date  . ABDOMINAL HYSTERECTOMY  2002   TAH  . DIAGNOSTIC LAPAROSCOPY  1993, 1994, 1998, 2000   x4  . TMJ ARTHROPLASTY     x2    Family History  Problem Relation Age of Onset  . Arthritis Mother        rhematiod  . Diabetes Maternal Grandmother   . Depression Neg Hx        family    Social History   Socioeconomic History  . Marital status: Married    Spouse name: Not on file  . Number of children: Not on file  . Years of education: Not on file  . Highest education level: Not on file  Occupational History  . Not on file  Tobacco Use  . Smoking status: Never Smoker   . Smokeless tobacco: Never Used  Vaping Use  . Vaping Use: Never used  Substance and Sexual Activity  . Alcohol use: No  . Drug use: Not Currently  . Sexual activity: Not on file  Other Topics Concern  . Not on file  Social History Narrative  . Not on file   Social Determinants of Health   Financial Resource Strain: Not on file  Food Insecurity: Not on file  Transportation Needs: Not on file  Physical Activity: Not on file  Stress: Not on file  Social Connections: Not on file  Intimate Partner Violence: Not on file    Outpatient Medications Prior to Visit  Medication Sig Dispense Refill  . amphetamine-dextroamphetamine (ADDERALL) 20 MG tablet Take 1 tablet (20 mg total) by mouth in the morning, at noon, and at bedtime. 90 tablet 0  . cariprazine (VRAYLAR) capsule Take 1 capsule (3 mg total) by mouth daily. 30 capsule 0  . nitroGLYCERIN (NITRODUR - DOSED IN MG/24 HR) 0.2 mg/hr patch APPLY 1/4 OF A PATCH TO SKIN ONCE DAILY. 30 patch 0  . ondansetron (ZOFRAN-ODT) 4 MG disintegrating tablet DISSOLVE/TAKE 1 TABLET BY MOUTH EVERY 8 HOURS AS NEEDED FOR NAUSEA AND VOMITING 20 tablet 0  . Oxycodone HCl 10 MG TABS Take 1 tablet by mouth every  6 hours 120 tablet 0  . pantoprazole (PROTONIX) 40 MG tablet Take 1 tablet (40 mg total) by mouth daily. 30 tablet 5  . pregabalin (LYRICA) 150 MG capsule Take 1 capsule (150 mg total) by mouth in the morning, at noon, and at bedtime. 270 capsule 3  . QUEtiapine (SEROQUEL) 25 MG tablet Take 3 tablets (75 mg total) by mouth at bedtime. 270 tablet 0  . sertraline (ZOLOFT) 100 MG tablet Take 2 tablets (200 mg total) by mouth daily. 180 tablet 1   No facility-administered medications prior to visit.    No Known Allergies  ROS Review of Systems  Constitutional: Negative for chills and fever.  Respiratory: Negative for shortness of breath.   Cardiovascular: Negative for chest pain.  Musculoskeletal: Positive for arthralgias, back pain and  myalgias. Negative for joint swelling.      Objective:    Physical Exam Vitals reviewed.  Constitutional:      Appearance: Normal appearance.  Cardiovascular:     Rate and Rhythm: Normal rate and regular rhythm.  Pulmonary:     Effort: Pulmonary effort is normal.     Breath sounds: Normal breath sounds.  Musculoskeletal:     Right lower leg: No edema.     Left lower leg: No edema.     Comments: Pain with abduction right shoulder  Neurological:     Mental Status: She is alert.     BP 110/80 (BP Location: Right Arm, Patient Position: Sitting, Cuff Size: Normal)   Pulse 80   Temp 98.1 F (36.7 C) (Oral)   Wt 159 lb 1.6 oz (72.2 kg)   SpO2 98%   BMI 29.10 kg/m  Wt Readings from Last 3 Encounters:  05/30/20 159 lb 1.6 oz (72.2 kg)  03/29/20 148 lb (67.1 kg)  02/26/20 155 lb 3.2 oz (70.4 kg)     Health Maintenance Due  Topic Date Due  . Hepatitis C Screening  Never done  . COVID-19 Vaccine (1) Never done  . HIV Screening  Never done  . PAP SMEAR-Modifier  Never done  . COLONOSCOPY (Pts 45-41yrs Insurance coverage will need to be confirmed)  Never done  . MAMMOGRAM  04/20/2018  . INFLUENZA VACCINE  12/27/2019    There are no preventive care reminders to display for this patient.  Lab Results  Component Value Date   TSH 3.45 06/03/2015   Lab Results  Component Value Date   WBC 5.2 12/14/2015   HGB 13.2 12/14/2015   HCT 39.2 12/14/2015   MCV 84.9 12/14/2015   PLT 222.0 12/14/2015   Lab Results  Component Value Date   NA 139 12/14/2015   K 4.0 12/14/2015   CO2 25 12/14/2015   GLUCOSE 89 12/14/2015   BUN 16 12/14/2015   CREATININE 0.78 12/14/2015   BILITOT 0.6 12/14/2015   ALKPHOS 83 12/14/2015   AST 19 12/14/2015   ALT 19 12/14/2015   PROT 7.4 12/14/2015   ALBUMIN 4.2 12/14/2015   CALCIUM 9.6 12/14/2015   GFR 83.91 12/14/2015   Lab Results  Component Value Date   CHOL 204 (H) 06/03/2015   Lab Results  Component Value Date   HDL 63.10  06/03/2015   Lab Results  Component Value Date   LDLCALC 127 (H) 06/03/2015   Lab Results  Component Value Date   TRIG 66.0 06/03/2015   Lab Results  Component Value Date   CHOLHDL 3 06/03/2015   No results found for: HGBA1C    Assessment & Plan:  Problem List Items Addressed This Visit      Unprioritized   CHRONIC PAIN SYNDROME   Fibromyalgia    Other Visit Diagnoses    Right shoulder pain, unspecified chronicity    -  Primary    -We recommended consideration for chronic pain management as above.  She currently has no insurance but will let us know if things change. -Continue close follow-up with sports medicine regarding her right shoulder pain -We recommend she consider short-term supplementation with Aleve or Advil if tolerated for her shoulder pain  No orders of the defined types were placed in this encounter.   Follow-up: Return in about 3 months (around 08/28/2020).    [name], MD

## 2020-05-31 ENCOUNTER — Other Ambulatory Visit: Payer: Self-pay | Admitting: Psychiatry

## 2020-05-31 ENCOUNTER — Telehealth: Payer: Self-pay | Admitting: Psychiatry

## 2020-05-31 DIAGNOSIS — F902 Attention-deficit hyperactivity disorder, combined type: Secondary | ICD-10-CM

## 2020-05-31 MED ORDER — AMPHETAMINE-DEXTROAMPHETAMINE 20 MG PO TABS
20.0000 mg | ORAL_TABLET | Freq: Three times a day (TID) | ORAL | 0 refills | Status: DC
Start: 2020-05-31 — End: 2020-06-29

## 2020-05-31 NOTE — Telephone Encounter (Signed)
Pt is requesting a refill on her Adderall. Fill at [facility]. Follow up appt scheduled for 1/11.

## 2020-05-31 NOTE — Progress Notes (Signed)
   I, Christoper Fabian, LAT, ATC, am serving as scribe for Dr. Clementeen Graham.  Toni Collins is a 55 y.o. female who presents to [facility] today for f/u of R shoulder pain.  She was last seen by Dr. Denyse Amass on 02/22/20 for R shoulder pain and previously by Dr. Katrinka Blazing for B shoulder pain.   She was given B GHJ injections.  Of note, pt has fibromyalgia and takes oxycodone for chronic pain management as well as Lyrica.  She saw her PCP yesterday for chronic pain management f/u and noted recent increase in her R shoulder pain.  Today, pt reports her R shoulder is very painful. Pain started to increase about 3 weeks ago and 1 week ago it became excruciating. Pt reports sometimes she will have severe burning in upperarm.  Pn w/ shoulder ABD. Pt reports she is compliant w/ HEP  Pt notes she hasn't heard back about the financial assistance   Pertinent review of systems: No fevers or chills  Relevant historical information: Chronic pain management with Dr. [name]   Exam:  BP 110/76 (BP Location: Left Arm, Patient Position: Sitting, Cuff Size: Normal)   Pulse 84   Ht 5\' 2"  (1.575 m)   Wt 162 lb 3.2 oz (73.6 kg)   SpO2 99%   BMI 29.67 kg/m  General: Well Developed, well nourished, and in no acute distress.   MSK: Right shoulder normal-appearing normal motion pain with abduction.    Lab and Radiology Results  Injection right shoulder glenohumeral joint posterior approach. Consent obtained and timeout performed.  Discussed risks and benefits. Skin cleaned with isopropyl alcohol [place] applied. 21-gauge needle used to access the posterior glenohumeral joint and 40 mg of Kenalog and 2 ml of Marcaine injected. Patient achieved good pain relief after the injection indicating accurate placement of medication. Patient tolerated procedure well.    Assessment and Plan: 55 y.o. female with right shoulder pain thought to be due to rotator cuff tendinopathy.  Patient  has done well in the past with glenohumeral injections.  Plan to repeat injection today.  Patient does not have health insurance which limits her access to medical care and imaging.  I am holding off on x-ray for now but if not improving would consider getting a shoulder x-ray.  Recheck back as needed for repeat injections in the future.  Additionally provided patient with the application form for the [facility] health charity program.  She should contact them and reapply.  After talking with her it looks like she probably did not send in her tax return information which may be why she never heard back.   PDMP not reviewed this encounter. Orders Placed This Encounter  Procedures  . [facility] LIMITED JOINT SPACE STRUCTURES UP RIGHT(NO LINKED CHARGES)    Standing Status:   Future    Number of Occurrences:   1    Standing Expiration Date:   11/29/2020    Order Specific Question:   Reason for Exam (SYMPTOM  OR DIAGNOSIS REQUIRED)    Answer:   chronic right shoulder pain    Order Specific Question:   Preferred imaging location?    Answer:   [facility]   No orders of the defined types were placed in this encounter.    Discussed warning signs or symptoms. Please see discharge instructions. Patient expresses understanding.   The above documentation has been reviewed and is accurate and complete 01/30/2021, M.D.

## 2020-06-01 ENCOUNTER — Ambulatory Visit: Payer: Self-pay

## 2020-06-01 ENCOUNTER — Ambulatory Visit (INDEPENDENT_AMBULATORY_CARE_PROVIDER_SITE_OTHER): Payer: Self-pay | Admitting: Family Medicine

## 2020-06-01 ENCOUNTER — Other Ambulatory Visit: Payer: Self-pay

## 2020-06-01 VITALS — BP 110/76 | HR 84 | Ht 62.0 in | Wt 162.2 lb

## 2020-06-01 DIAGNOSIS — M67911 Unspecified disorder of synovium and tendon, right shoulder: Secondary | ICD-10-CM

## 2020-06-01 DIAGNOSIS — M25511 Pain in right shoulder: Secondary | ICD-10-CM

## 2020-06-01 DIAGNOSIS — G8929 Other chronic pain: Secondary | ICD-10-CM

## 2020-06-01 NOTE — Patient Instructions (Addendum)
Thank you for coming in today.  Call or go to the ER if you develop a large red swollen joint with extreme pain or oozing puss.   Recheck as needed.   Contact the office about the charity care. I will give you another form.

## 2020-06-07 ENCOUNTER — Ambulatory Visit: Payer: Self-pay | Admitting: Psychiatry

## 2020-06-29 ENCOUNTER — Other Ambulatory Visit: Payer: Self-pay | Admitting: Psychiatry

## 2020-06-29 ENCOUNTER — Telehealth (INDEPENDENT_AMBULATORY_CARE_PROVIDER_SITE_OTHER): Payer: Self-pay | Admitting: Family Medicine

## 2020-06-29 ENCOUNTER — Telehealth: Payer: Self-pay | Admitting: Psychiatry

## 2020-06-29 DIAGNOSIS — K219 Gastro-esophageal reflux disease without esophagitis: Secondary | ICD-10-CM

## 2020-06-29 DIAGNOSIS — F902 Attention-deficit hyperactivity disorder, combined type: Secondary | ICD-10-CM

## 2020-06-29 MED ORDER — PANTOPRAZOLE SODIUM 40 MG PO TBEC: 40.0000 mg | DELAYED_RELEASE_TABLET | Freq: Every day | ORAL | 5 refills | Status: DC

## 2020-06-29 MED ORDER — AMPHETAMINE-DEXTROAMPHETAMINE 20 MG PO TABS: 20.0000 mg | ORAL_TABLET | Freq: Three times a day (TID) | ORAL | 0 refills | Status: DC

## 2020-06-29 NOTE — Progress Notes (Signed)
Patient ID: Toni Collins, female   DOB: November 30, 1965, 55 y.o.   MRN: [id number]  This visit type was conducted due to national recommendations for restrictions regarding the COVID-19 pandemic in an effort to limit this patient's exposure and mitigate transmission in our community.   Virtual Visit via Video Note  I connected with [name] on 06/29/20 at 11:45 AM EST by a video enabled telemedicine application and verified that I am speaking with the correct person using two identifiers.  Location patient: home Location provider:work or home office Persons participating in the virtual visit: patient, provider  I discussed the limitations of evaluation and management by telemedicine and the availability of in person appointments. The patient expressed understanding and agreed to proceed.   HPI:  Toni Collins called with episode of vomiting apparently on Saturday with some possible blood.  She felt like this was probably related to severe reflux.  She has long history of chronic cough and reflux symptoms.  She denies any hemoptysis.  She states this was mostly red in color with no coffee-ground emesis.  No melena.  She apparently ran out of Protonix over a week ago.  She did take occasional over-the-counter Nexium.  Still having reflux symptoms.  She is aware of dietary triggers.  Denies any significant odynophagia.  No significant dysphagia.  Appetite and weight stable.  No recent aspirin or nonsteroidal use.  No alcohol use.  Sometimes has nausea about 2 hours after eating but not consistently with each meal.   ROS: See pertinent positives and negatives per HPI.  Past Medical History:  Diagnosis Date  . ADHD   . Anxiety   . Colon polyps   . DEPRESSION 09/02/2008  . Fibromyalgia   . GRIEF REACTION 09/30/2009  . INSOMNIA, CHRONIC 09/02/2008  . PREDIABETES 03/10/2009    Past Surgical History:  Procedure Laterality Date  . ABDOMINAL HYSTERECTOMY  2002   TAH  . [place], 2000   x4  . TMJ ARTHROPLASTY     x2    Family History  Problem Relation Age of Onset  . Arthritis Mother        rhematiod  . Diabetes Maternal Grandmother   . Depression Neg Hx        family    SOCIAL HX: Non-smoker.  No alcohol use.   Current Outpatient Medications:  .  amphetamine-dextroamphetamine (ADDERALL) 20 MG tablet, Take 1 tablet (20 mg total) by mouth in the morning, at noon, and at bedtime., Disp: 90 tablet, Rfl: 0 .  cariprazine (VRAYLAR) capsule, Take 1 capsule (3 mg total) by mouth daily., Disp: 30 capsule, Rfl: 0 .  nitroGLYCERIN (NITRODUR - DOSED IN MG/24 HR) 0.2 mg/hr patch, APPLY 1/4 OF A PATCH TO SKIN ONCE DAILY., Disp: 30 patch, Rfl: 0 .  ondansetron (ZOFRAN-ODT) 4 MG disintegrating tablet, DISSOLVE/TAKE 1 TABLET BY MOUTH EVERY 8 HOURS AS NEEDED FOR NAUSEA AND VOMITING, Disp: 20 tablet, Rfl: 0 .  Oxycodone HCl 10 MG TABS, Take 1 tablet by mouth every 6 hours, Disp: 120 tablet, Rfl: 0 .  Oxycodone HCl 10 MG TABS, Take 1 tablet by mouth every 6 hours, Disp: 120 tablet, Rfl: 0 .  pantoprazole (PROTONIX) 40 MG tablet, Take 1 tablet (40 mg total) by mouth daily., Disp: 30 tablet, Rfl: 5 .  pregabalin (LYRICA) 150 MG capsule, Take 1 capsule (150 mg total) by mouth in the morning, at noon, and at bedtime., Disp: 270 capsule, Rfl: 3 .  QUEtiapine (  SEROQUEL) 25 MG tablet, Take 3 tablets (75 mg total) by mouth at bedtime., Disp: 270 tablet, Rfl: 0 .  sertraline (ZOLOFT) 100 MG tablet, Take 2 tablets (200 mg total) by mouth daily., Disp: 180 tablet, Rfl: 1  EXAM:  VITALS per patient if applicable:  GENERAL: alert, oriented, appears well and in no acute distress  HEENT: atraumatic, conjunttiva clear, no obvious abnormalities on inspection of external nose and ears  NECK: normal movements of the head and neck  LUNGS: on inspection no signs of respiratory distress, breathing rate appears normal, no obvious gross SOB, gasping or wheezing  CV: no  obvious cyanosis  MS: moves all visible extremities without noticeable abnormality  PSYCH/NEURO: pleasant and cooperative, no obvious depression or anxiety, speech and thought processing grossly intact  ASSESSMENT AND PLAN:  Discussed the following assessment and plan:  GERD.  Patient recently ran out of Protonix.  One episode of small-volume hematemesis but no evidence for active bleeding since then.  Denies any melena.  No red flags such as appetite change, weight loss, odynophagia, or dysphagia.  -Discussed dietary factors -Get back on Protonix with new prescription sent 40 mg daily -We did discuss possible use of over-the-counter Pepcid to use as needed for breakthrough symptoms. -If she is not seeing total resolution of symptoms with her tonics be in touch after 1 or 2 weeks -We did discuss also concerns for chronic PPI use with potential for decreased calcium, magnesium, and B12 absorption.  We also discussed the balance of trying to avoid under treating GERD symptoms and potential long-term risk of untreated GERD     I discussed the assessment and treatment plan with the patient. The patient was provided an opportunity to ask questions and all were answered. The patient agreed with the plan and demonstrated an understanding of the instructions.   The patient was advised to call back or seek an in-person evaluation if the symptoms worsen or if the condition fails to improve as anticipated.     Carolann Littler, MD

## 2020-06-29 NOTE — Telephone Encounter (Signed)
Pt called in for refill of Adderall. Send into [place]

## 2020-07-20 ENCOUNTER — Telehealth: Payer: Self-pay | Admitting: Psychiatry

## 2020-07-20 NOTE — Telephone Encounter (Signed)
Patient called in today requesting refill for Lyrica. She states that a new company is handling her patient assistance. And they need a prescription to be put on hold to update the file, as they do not have one. Pharmacy [facility] patience assistance program

## 2020-07-21 ENCOUNTER — Other Ambulatory Visit: Payer: Self-pay

## 2020-07-21 DIAGNOSIS — G894 Chronic pain syndrome: Secondary | ICD-10-CM

## 2020-07-21 MED ORDER — PREGABALIN 150 MG PO CAPS: 150.0000 mg | ORAL_CAPSULE | Freq: Three times a day (TID) | ORAL | 1 refills | Status: DC

## 2020-07-21 NOTE — Telephone Encounter (Signed)
Patient will be receiving her patient assistance from a different company, it's called [employer] Patient [employer]  [phone] F. [phone]  The pharmacy we submit to is [place]  They do need her Rx for Lyrica

## 2020-07-25 ENCOUNTER — Encounter: Payer: Self-pay | Admitting: Family Medicine

## 2020-07-26 ENCOUNTER — Telehealth: Payer: Self-pay | Admitting: Psychiatry

## 2020-07-26 ENCOUNTER — Other Ambulatory Visit: Payer: Self-pay | Admitting: Psychiatry

## 2020-07-26 DIAGNOSIS — F902 Attention-deficit hyperactivity disorder, combined type: Secondary | ICD-10-CM

## 2020-07-26 MED ORDER — AMPHETAMINE-DEXTROAMPHETAMINE 20 MG PO TABS: 20.0000 mg | ORAL_TABLET | Freq: Three times a day (TID) | ORAL | 0 refills | Status: DC

## 2020-07-26 NOTE — Telephone Encounter (Signed)
Pt would like a refill on Adderall. [facility] [place]

## 2020-08-22 ENCOUNTER — Other Ambulatory Visit: Payer: Self-pay

## 2020-08-23 ENCOUNTER — Ambulatory Visit (INDEPENDENT_AMBULATORY_CARE_PROVIDER_SITE_OTHER): Payer: Self-pay | Admitting: Family Medicine

## 2020-08-23 ENCOUNTER — Encounter: Payer: Self-pay | Admitting: Family Medicine

## 2020-08-23 VITALS — BP 122/70 | HR 97 | Temp 98.5°F | Wt 168.1 lb

## 2020-08-23 DIAGNOSIS — I7300 Raynaud's syndrome without gangrene: Secondary | ICD-10-CM

## 2020-08-23 DIAGNOSIS — G894 Chronic pain syndrome: Secondary | ICD-10-CM

## 2020-08-23 MED ORDER — OXYCODONE HCL 10 MG PO TABS: ORAL_TABLET | ORAL | 0 refills | Status: DC

## 2020-08-23 NOTE — Patient Instructions (Signed)
Raynaud Phenomenon  Raynaud phenomenon is a condition that affects the blood vessels (arteries) that carry blood to your fingers and toes. The arteries that supply blood to your ears, lips, nipples, or the tip of your nose might also be affected. Raynaud phenomenon causes the arteries to become narrow temporarily (spasm). As a result, the flow of blood to the affected areas is temporarily decreased. This usually occurs in response to cold temperatures or stress. During an attack, the skin in the affected areas turns white, then blue, and finally red. You may also feel tingling or numbness in those areas. Attacks usually last for only a brief period, and then the blood flow to the area returns to normal. In most cases, Raynaud phenomenon does not cause serious health problems. What are the causes? In many cases, the cause of this condition is not known. The condition may occur on its own (primary Raynaud phenomenon) or may be associated with other diseases or factors (secondary Raynaud phenomenon). Possible causes may include:  Diseases or medical conditions that damage the arteries.  Injuries and repetitive actions that hurt the hands or feet.  Being exposed to certain chemicals.  Taking medicines that narrow the arteries.  Other medical conditions, such as lupus, scleroderma, rheumatoid arthritis, thyroid problems, blood disorders, Sjogren syndrome, or atherosclerosis. What increases the risk? The following factors may make you more likely to develop this condition:  Being 20-40 years old.  Being female.  Having a family history of Raynaud phenomenon.  Living in a cold climate.  Smoking. What are the signs or symptoms? Symptoms of this condition usually occur when you are exposed to cold temperatures or when you have emotional stress. The symptoms may last for a few minutes or up to several hours. They usually affect your fingers but may also affect your toes, nipples, lips, ears, or  the tip of your nose. Symptoms may include:  Changes in skin color. The skin in the affected areas will turn pale or white. The skin may then change from white to bluish to red as normal blood flow returns to the area.  Numbness, tingling, or pain in the affected areas. In severe cases, symptoms may include:  Skin sores.  Tissues decaying and dying (gangrene). How is this diagnosed? This condition may be diagnosed based on:  Your symptoms and medical history.  A physical exam. During the exam, you may be asked to put your hands in cold water to check for a reaction to cold temperature.  Tests, such as: ? Blood tests to check for other diseases or conditions. ? A test to check the movement of blood through your arteries and veins (vascular ultrasound). ? A test in which the skin at the base of your fingernail is examined under a microscope (nailfold capillaroscopy). How is this treated? Treatment for this condition often involves making lifestyle changes and taking steps to control your exposure to cold temperatures. For more severe cases, medicine (calcium channel blockers) may be used to improve blood flow. Surgery is sometimes done to block the nerves that control the affected arteries, but this is rare. Follow these instructions at home: Avoiding cold temperatures Take these steps to avoid exposure to cold:  If possible, stay indoors during cold weather.  When you go outside during cold weather, dress in layers and wear mittens, a hat, a scarf, and warm footwear.  Wear mittens or gloves when handling ice or frozen food.  Use holders for glasses or cans containing cold drinks.    Let warm water run for a while before taking a shower or bath.  Warm up the car before driving in cold weather. Lifestyle  If possible, avoid stressful and emotional situations. Try to find ways to manage your stress, such as: ? Exercise. ? Yoga. ? Meditation. ? Biofeedback.  Do not use any  products that contain nicotine or tobacco, such as cigarettes and e-cigarettes. If you need help quitting, ask your health care provider.  Avoid secondhand smoke.  Limit your use of caffeine. ? Switch to decaffeinated coffee, tea, and soda. ? Avoid chocolate.  Avoid vibrating tools and machinery. General instructions  Protect your hands and feet from injuries, cuts, or bruises.  Avoid wearing tight rings or wristbands.  Wear loose fitting socks and comfortable, roomy shoes.  Take over-the-counter and prescription medicines only as told by your health care provider. Contact a health care provider if:  Your discomfort becomes worse despite lifestyle changes.  You develop sores on your fingers or toes that do not heal.  Your fingers or toes turn black.  You have breaks in the skin on your fingers or toes.  You have a fever.  You have pain or swelling in your joints.  You have a rash.  Your symptoms occur on only one side of your body. Summary  Raynaud phenomenon is a condition that affects the arteries that carry blood to your fingers, toes, ears, lips, nipples, or the tip of your nose.  In many cases, the cause of this condition is not known.  Symptoms of this condition include changes in skin color, and numbness and tingling of the affected area.  Treatment for this condition includes lifestyle changes, reducing exposure to cold temperatures, and using medicines for severe cases of the condition.  Contact your health care provider if your condition worsens despite treatment. This information is not intended to replace advice given to you by your health care provider. Make sure you discuss any questions you have with your health care provider. Document Revised: 09/24/2019 Document Reviewed: 09/24/2019 Elsevier Patient Education  2021 [place]. Chronic Pain, Adult Chronic pain is a type of pain that lasts or keeps coming back for at least 3-6 months. You may have  headaches, pain in the abdomen, or pain in other areas of the body. Chronic pain may be related to an illness, such as fibromyalgia or complex regional pain syndrome. Chronic pain may also be related to an injury or a health condition. Sometimes, the cause of chronic pain is not known. Chronic pain can make it hard for you to do daily activities. If not treated, chronic pain can lead to anxiety and depression. Treatment depends on the cause and severity of your pain. You may need to work with a pain specialist to come up with a treatment plan. The plan may include medicine, counseling, and physical therapy. Many people benefit from a combination of two or more types of treatment to control their pain. Follow these instructions at home: Medicines  Take over-the-counter and prescription medicines only as told by your health care provider.  Ask your health care provider if the medicine prescribed to you: ? Requires you to avoid driving or using machinery. ? Can cause constipation. You may need to take these actions to prevent or treat constipation:  Drink enough fluid to keep your urine pale yellow.  Take over-the-counter or prescription medicines.  Eat foods that are high in fiber, such as beans, whole grains, and fresh fruits and vegetables.  Limit  foods that are high in fat and processed sugars, such as fried or sweet foods. Treatment plan Follow your treatment plan as told by your health care provider. This may include:  Gentle, regular exercise.  Eating a healthy diet that includes foods such as vegetables, fruits, fish, and lean meats.  Cognitive or behavioral therapy that changes the way you think or act in response to the pain. This may help improve how you feel.  Working with a physical therapist.  Meditation, yoga, acupuncture, or massage therapy.  Aroma, color, light, or sound therapy.  Local electrical stimulation. The electrical pulses help to relieve pain by temporarily  stopping the nerve impulses that cause you to feel pain.  Injections. These deliver numbing or pain-relieving medicines into the spine or the area of pain.   Lifestyle  Ask your health care provider whether you should keep a pain diary. Your health care provider will tell you what information to write in the diary. This may include when you have pain, what the pain feels like, and how medicines and other behaviors or treatments help to reduce the pain.  Consider talking with a mental health care provider about how to manage chronic pain.  Consider joining a chronic pain support group.  Try to control or lower your stress levels. Talk with your health care provider about ways to do this.   General instructions  Learn as much as you can about how to manage your chronic pain. Ask your health care provider if an intensive pain rehabilitation program or a chronic pain specialist would be helpful.  Check your pain level as told by your health care provider. Ask your health care provider if you should use a pain scale.  It is up to you to get the results of any tests that were done. Ask your health care provider, or the department that is doing the tests, when your results will be ready.  Keep all follow-up visits as told by your health care provider. This is important. Contact a health care provider if:  Your pain gets worse, or you have new pain.  You have trouble sleeping.  You have trouble doing your normal activities.  Your pain is not controlled with treatment.  You have side effects from pain medicine.  You feel weak.  You notice any other changes that show that your condition is getting worse. Get help right away if:  You lose feeling or have numbness in your body.  You lose control of bowel or bladder function.  Your pain suddenly gets much worse.  You develop shaking or chills.  You develop confusion.  You develop chest pain.  You have trouble breathing or  shortness of breath.  You pass out.  You have thoughts about hurting yourself or others. If you ever feel like you may hurt yourself or others, or have thoughts about taking your own life, get help right away. Go to your nearest emergency department or:  Call your local emergency services (911 in the [place]  Call a suicide crisis helpline, such as the [facility] [place] [facility] at (754) 364-7080. This is open 24 hours a day in the [place]  Text the Crisis Text Line at 870-764-6971 (in the [place] Summary  Chronic pain is a type of pain that lasts or keeps coming back for at least 3-6 months.  Chronic pain may be related to an illness, injury, or other health condition. Sometimes, the cause of chronic pain is not known.  Treatment depends on  the cause and severity of your pain.  Many people benefit from a combination of two or more types of treatment to control their pain.  Follow your treatment plan as told by your health care provider. This information is not intended to replace advice given to you by your health care provider. Make sure you discuss any questions you have with your health care provider. Document Revised: 01/29/2019 Document Reviewed: 01/29/2019 Elsevier Patient Education  [place].

## 2020-08-23 NOTE — Progress Notes (Signed)
Established Patient Office Visit  Subjective:  Patient ID: Toni Collins, female    DOB: Mar 27, 1966  Age: 55 y.o. MRN: [id number]  CC:  Chief Complaint  Patient presents with  . Pain Management    HPI Toni Collins presents for chronic pain management follow-up.  She is overdue for physical but currently has no insurance and hopefully that can be scheduled soon if she gets any coverage.  She is applying for disability and is followed closely by psychiatry.  She does have history of posttraumatic stress disorder and is on several medications per psychiatry.  She has chronic back pain.  She does have fibromyalgia.  She was tried on multiple medications previously including muscle relaxers, Cymbalta, tricyclic's, Ultram, non-steroidals, without improvement.  She is on Lyrica per psychiatry.  She has been on opioids for several years.  She is currently on regimen of oxycodone 10 mg 4 times daily.  She does have occasional radiculitis symptoms on the right lower extremity but not consistently.  She has daily chronic pain and states she has never in total relief.  She does have diffuse myalgias but especially has severe low back pain.  Her pain is frequently 7-8 out of 10.  She does hope to see pain management if she gets coverage.  She relates Raynaud's phenomenon.  This involves hands usually with cold weather such as a cold rainy day.  Symptoms are usually brief.  They reperfuse with warm water exposure.  No foot involvement.  Non-smoker.  Past Medical History:  Diagnosis Date  . ADHD   . Anxiety   . Colon polyps   . DEPRESSION 09/02/2008  . Fibromyalgia   . GRIEF REACTION 09/30/2009  . INSOMNIA, CHRONIC 09/02/2008  . PREDIABETES 03/10/2009    Past Surgical History:  Procedure Laterality Date  . ABDOMINAL HYSTERECTOMY  2002   TAH  . [place], 2000   x4  . TMJ ARTHROPLASTY     x2    Family History  Problem Relation Age of Onset  . Arthritis  Mother        rhematiod  . Diabetes Maternal Grandmother   . Depression Neg Hx        family    Social History   Socioeconomic History  . Marital status: Married    Spouse name: Not on file  . Number of children: Not on file  . Years of education: Not on file  . Highest education level: Not on file  Occupational History  . Not on file  Tobacco Use  . Smoking status: Never Smoker  . Smokeless tobacco: Never Used  Vaping Use  . Vaping Use: Never used  Substance and Sexual Activity  . Alcohol use: No  . Drug use: Not Currently  . Sexual activity: Not on file  Other Topics Concern  . Not on file  Social History Narrative  . Not on file   Social Determinants of Health   Financial Resource Strain: Not on file  Food Insecurity: Not on file  Transportation Needs: Not on file  Physical Activity: Not on file  Stress: Not on file  Social Connections: Not on file  Intimate Partner Violence: Not on file    Outpatient Medications Prior to Visit  Medication Sig Dispense Refill  . amphetamine-dextroamphetamine (ADDERALL) 20 MG tablet Take 1 tablet (20 mg total) by mouth in the morning, at noon, and at bedtime. 90 tablet 0  . cariprazine (VRAYLAR) capsule Take 1 capsule (  3 mg total) by mouth daily. 30 capsule 0  . nitroGLYCERIN (NITRODUR - DOSED IN MG/24 HR) 0.2 mg/hr patch APPLY 1/4 OF A PATCH TO SKIN ONCE DAILY. 30 patch 0  . ondansetron (ZOFRAN-ODT) 4 MG disintegrating tablet DISSOLVE/TAKE 1 TABLET BY MOUTH EVERY 8 HOURS AS NEEDED FOR NAUSEA AND VOMITING 20 tablet 0  . pantoprazole (PROTONIX) 40 MG tablet Take 1 tablet (40 mg total) by mouth daily. 30 tablet 5  . pregabalin (LYRICA) 150 MG capsule Take 1 capsule (150 mg total) by mouth in the morning, at noon, and at bedtime. 270 capsule 1  . QUEtiapine (SEROQUEL) 25 MG tablet Take 3 tablets (75 mg total) by mouth at bedtime. 270 tablet 0  . sertraline (ZOLOFT) 100 MG tablet Take 2 tablets (200 mg total) by mouth daily. 180  tablet 1  . Oxycodone HCl 10 MG TABS Take 1 tablet by mouth every 6 hours 120 tablet 0  . Oxycodone HCl 10 MG TABS Take 1 tablet by mouth every 6 hours 120 tablet 0   No facility-administered medications prior to visit.    No Known Allergies  ROS Review of Systems  Constitutional: Negative for chills and fever.  Musculoskeletal: Positive for back pain and myalgias.      Objective:    Physical Exam Constitutional:      Appearance: She is well-developed.  Eyes:     Pupils: Pupils are equal, round, and reactive to light.  Neck:     Thyroid: No thyromegaly.     Vascular: No JVD.  Cardiovascular:     Rate and Rhythm: Normal rate and regular rhythm.     Heart sounds: No gallop.   Pulmonary:     Effort: Pulmonary effort is normal. No respiratory distress.     Breath sounds: Normal breath sounds. No wheezing or rales.  Musculoskeletal:     Cervical back: Neck supple.  Neurological:     Mental Status: She is alert.     BP 122/70 (BP Location: Left Arm, Patient Position: Sitting, Cuff Size: Normal)   Pulse 97   Temp 98.5 F (36.9 C) (Oral)   Wt 168 lb 1.6 oz (76.2 kg)   SpO2 99%   BMI 30.75 kg/m  Wt Readings from Last 3 Encounters:  08/23/20 168 lb 1.6 oz (76.2 kg)  06/01/20 162 lb 3.2 oz (73.6 kg)  05/30/20 159 lb 1.6 oz (72.2 kg)     Health Maintenance Due  Topic Date Due  . Hepatitis C Screening  Never done  . COVID-19 Vaccine (1) Never done  . HIV Screening  Never done  . PAP SMEAR-Modifier  Never done  . COLONOSCOPY (Pts 45-77yrs Insurance coverage will need to be confirmed)  Never done  . MAMMOGRAM  04/20/2018  . INFLUENZA VACCINE  12/27/2019    There are no preventive care reminders to display for this patient.  Lab Results  Component Value Date   TSH 3.45 06/03/2015   Lab Results  Component Value Date   WBC 5.2 12/14/2015   HGB 13.2 12/14/2015   HCT 39.2 12/14/2015   MCV 84.9 12/14/2015   PLT 222.0 12/14/2015   Lab Results  Component Value  Date   NA 139 12/14/2015   K 4.0 12/14/2015   CO2 25 12/14/2015   GLUCOSE 89 12/14/2015   BUN 16 12/14/2015   CREATININE 0.78 12/14/2015   BILITOT 0.6 12/14/2015   ALKPHOS 83 12/14/2015   AST 19 12/14/2015   ALT 19 12/14/2015   PROT  7.4 12/14/2015   ALBUMIN 4.2 12/14/2015   CALCIUM 9.6 12/14/2015   GFR 83.91 12/14/2015   Lab Results  Component Value Date   CHOL 204 (H) 06/03/2015   Lab Results  Component Value Date   HDL 63.10 06/03/2015   Lab Results  Component Value Date   LDLCALC 127 (H) 06/03/2015   Lab Results  Component Value Date   TRIG 66.0 06/03/2015   Lab Results  Component Value Date   CHOLHDL 3 06/03/2015   No results found for: HGBA1C    Assessment & Plan:   Problem List Items Addressed This Visit      Unprioritized   CHRONIC PAIN SYNDROME - Primary   Relevant Medications   Oxycodone HCl 10 MG TABS   Oxycodone HCl 10 MG TABS   Oxycodone HCl 10 MG TABS    Other Visit Diagnoses    Raynaud's phenomenon without gangrene        Her chronic pain is essentially unchanged. She hopes to get hooked up with chronic pain management once she gets insurance.  -We discussed her Raynaud's phenomenon.  She does not have evidence for inflammatory arthritis.  We explained Raynaud's is common even in individuals without significant major autoimmune disorder.  This is occurring currently just in cold weather.  Observe for now.  Could consider low-dose calcium channel blocker if symptoms worsen  Meds ordered this encounter  Medications  . Oxycodone HCl 10 MG TABS    Sig: Take 1 tablet by mouth every 6 hours    Dispense:  120 tablet    Refill:  0  . Oxycodone HCl 10 MG TABS    Sig: Take 1 tablet by mouth every 6 hours.  May refill in one month.    Dispense:  120 tablet    Refill:  0  . Oxycodone HCl 10 MG TABS    Sig: Take 1 tablet by mouth every 6 hours.  May refill in 2 months    Dispense:  120 tablet    Refill:  0    Follow-up: No follow-ups on file.     Carolann Littler, MD

## 2020-08-25 ENCOUNTER — Other Ambulatory Visit: Payer: Self-pay | Admitting: Psychiatry

## 2020-08-25 ENCOUNTER — Telehealth: Payer: Self-pay | Admitting: Psychiatry

## 2020-08-25 DIAGNOSIS — F902 Attention-deficit hyperactivity disorder, combined type: Secondary | ICD-10-CM

## 2020-08-25 DIAGNOSIS — F39 Unspecified mood [affective] disorder: Secondary | ICD-10-CM

## 2020-08-25 DIAGNOSIS — F411 Generalized anxiety disorder: Secondary | ICD-10-CM

## 2020-08-25 DIAGNOSIS — F4310 Post-traumatic stress disorder, unspecified: Secondary | ICD-10-CM

## 2020-08-25 MED ORDER — CARIPRAZINE HCL 3 MG PO CAPS
3.0000 mg | ORAL_CAPSULE | Freq: Every day | ORAL | 1 refills | Status: DC
Start: 2020-08-25 — End: 2020-11-17

## 2020-08-25 MED ORDER — AMPHETAMINE-DEXTROAMPHETAMINE 20 MG PO TABS: 20.0000 mg | ORAL_TABLET | Freq: Three times a day (TID) | ORAL | 0 refills | Status: DC

## 2020-08-25 NOTE — Telephone Encounter (Signed)
Please review

## 2020-08-25 NOTE — Telephone Encounter (Signed)
Toni Collins called in today requesting refill for Vraylar 3mg  and Adderall 20mg . Pharmacy [facility] [id number] [place], [place]

## 2020-08-26 ENCOUNTER — Ambulatory Visit: Payer: Self-pay | Admitting: Family Medicine

## 2020-09-06 ENCOUNTER — Telehealth: Payer: Self-pay | Admitting: Psychiatry

## 2020-09-06 NOTE — Telephone Encounter (Signed)
Toni Collins has been ordered from pt. Assistance.  It will be received in 7-10 days.  She has 2 refills remaining.

## 2020-09-06 NOTE — Telephone Encounter (Signed)
Pt called and said that the her vraylar is patient assistance and when she calls to need a refill we need to to call pt assistance . The information is in her chart. Leda Gauze is going to call today to make sure it gets sent. Just an [place]

## 2020-09-22 ENCOUNTER — Telehealth: Payer: Self-pay | Admitting: Psychiatry

## 2020-09-22 ENCOUNTER — Other Ambulatory Visit: Payer: Self-pay

## 2020-09-22 DIAGNOSIS — F902 Attention-deficit hyperactivity disorder, combined type: Secondary | ICD-10-CM

## 2020-09-22 NOTE — Telephone Encounter (Signed)
pended

## 2020-09-22 NOTE — Telephone Encounter (Signed)
Tayah called to request refill of her Adderall.  Appt 11/17/20.  [facility] to [facility] [address] in [place], [place]

## 2020-09-23 MED ORDER — AMPHETAMINE-DEXTROAMPHETAMINE 20 MG PO TABS: 20.0000 mg | ORAL_TABLET | Freq: Three times a day (TID) | ORAL | 0 refills | Status: DC

## 2020-10-20 ENCOUNTER — Telehealth: Payer: Self-pay | Admitting: Psychiatry

## 2020-10-20 ENCOUNTER — Other Ambulatory Visit: Payer: Self-pay

## 2020-10-20 DIAGNOSIS — F902 Attention-deficit hyperactivity disorder, combined type: Secondary | ICD-10-CM

## 2020-10-20 MED ORDER — AMPHETAMINE-DEXTROAMPHETAMINE 20 MG PO TABS: 20.0000 mg | ORAL_TABLET | Freq: Three times a day (TID) | ORAL | 0 refills | Status: DC

## 2020-10-20 NOTE — Telephone Encounter (Signed)
Last filled 4/29 I will pend but will she be able to pick it up that early?

## 2020-10-20 NOTE — Telephone Encounter (Signed)
Pt called to report not leaving until tomorrow. Will be able to pick up tomorrow morning if sent in this evening before she leaves.

## 2020-10-20 NOTE — Telephone Encounter (Signed)
Last fill 4/29. [facility]. OK early refill

## 2020-10-20 NOTE — Telephone Encounter (Signed)
Toni Collins called to request refill of her Adderall.  She is leaving town today. Last minute plans. Sorry for rush,but she request that it be sent in ASAP.  [facility] [place]  Appt 6/23

## 2020-10-22 ENCOUNTER — Other Ambulatory Visit: Payer: Self-pay | Admitting: Psychiatry

## 2020-10-22 DIAGNOSIS — F4310 Post-traumatic stress disorder, unspecified: Secondary | ICD-10-CM

## 2020-10-31 NOTE — Progress Notes (Signed)
   I, Peterson Lombard, LAT, ATC acting as a scribe for Lynne Leader, MD.  Toni Collins is a 55 y.o. female who presents to [place] [facility] today for continued bilat shoulder pain. Of note, pt has fibromyalgia and takes oxycodone and Lyrica for chronic pain management. Pt was last seen by Dr. Georgina Snell on 06/01/20 for R shoulder pain and was give a R [place] joint steroid injection. Today, pt reports much relief from the prior steroid injections. Pt states that the shoulders just started to flare back up over the last few weeks. Pt has been doing some painting which is helping her anxiety, but she's afraid it's going to irritate her shoulders.  She had bilateral glenohumeral injection September 2021.   Pertinent review of systems: No fevers or chills  Relevant historical information: PTSD, GAD, fibromyalgia   Exam:  BP (!) 130/94   Pulse 73   Ht 5\' 2"  (1.575 m)   Wt 166 lb 9.6 oz (75.6 kg)   SpO2 100%   BMI 30.47 kg/m  General: Well Developed, well nourished, and in no acute distress.   MSK: Bilateral shoulder normal motion.  Intact strength.    Lab and Radiology Results  Right posterior approach glenohumeral shoulder injection Indication: Shoulder pain Consent obtained Timeout performed. Skin sterilized with isopropyl alcohol. Cold spray applied. 22-gauge spinal needle used to access the glenohumeral joint. 40 mg of Kenalog and 2 mL of Marcaine injected achieving good anesthesia. Patient tolerated procedure well.  Left posterior approach glenohumeral joint shoulder injection. Indication: Shoulder pain. Consent obtained Timeout performed. Skin sterilized with isopropyl alcohol. Cold spray applied. 22-gauge spinal needle used to access the glenohumeral joint. 40 mg Kenalog and 2 mL of Marcaine injected achieving good anesthesia. Patient tolerated the procedure well.   Assessment and Plan: 55 y.o. female with bilateral shoulder pain.  Plan for  bilateral shoulder injections today.  Recheck as needed.  Encourage patient to apply for the counter charity program which will help me do more evaluation and treatment for her shoulder pain.  Would consider x-ray as needed physical therapy when she is eligible.   PDMP not reviewed this encounter.   Discussed warning signs or symptoms. Please see discharge instructions. Patient expresses understanding.   The above documentation has been reviewed and is accurate and complete Lynne Leader, M.D.

## 2020-11-01 ENCOUNTER — Other Ambulatory Visit: Payer: Self-pay

## 2020-11-01 ENCOUNTER — Ambulatory Visit: Payer: Self-pay

## 2020-11-01 ENCOUNTER — Ambulatory Visit (INDEPENDENT_AMBULATORY_CARE_PROVIDER_SITE_OTHER): Payer: Self-pay | Admitting: Family Medicine

## 2020-11-01 VITALS — BP 130/94 | HR 73 | Ht 62.0 in | Wt 166.6 lb

## 2020-11-01 DIAGNOSIS — M25511 Pain in right shoulder: Secondary | ICD-10-CM

## 2020-11-01 DIAGNOSIS — G8929 Other chronic pain: Secondary | ICD-10-CM

## 2020-11-01 DIAGNOSIS — M25512 Pain in left shoulder: Secondary | ICD-10-CM

## 2020-11-01 NOTE — Patient Instructions (Signed)
Thank you for coming in today.  Call or go to the ER if you develop a large red swollen joint with extreme pain or oozing puss.   Turn in the application.   Recheck as needed.   If you get approved let me know and I will get some xrays.

## 2020-11-09 ENCOUNTER — Encounter: Payer: Self-pay | Admitting: Family Medicine

## 2020-11-15 ENCOUNTER — Encounter: Payer: Self-pay | Admitting: Family Medicine

## 2020-11-16 ENCOUNTER — Other Ambulatory Visit: Payer: Self-pay

## 2020-11-16 ENCOUNTER — Ambulatory Visit (INDEPENDENT_AMBULATORY_CARE_PROVIDER_SITE_OTHER): Payer: Self-pay | Admitting: Family Medicine

## 2020-11-16 VITALS — BP 124/64 | HR 74 | Temp 98.2°F | Wt 161.3 lb

## 2020-11-16 DIAGNOSIS — M797 Fibromyalgia: Secondary | ICD-10-CM

## 2020-11-16 DIAGNOSIS — G894 Chronic pain syndrome: Secondary | ICD-10-CM

## 2020-11-16 DIAGNOSIS — D509 Iron deficiency anemia, unspecified: Secondary | ICD-10-CM

## 2020-11-16 LAB — CBC WITH DIFFERENTIAL/PLATELET
Basophils Absolute: 0.1 10*3/uL (ref 0.0–0.1)
Basophils Relative: 1.6 % (ref 0.0–3.0)
Eosinophils Absolute: 0.1 10*3/uL (ref 0.0–0.7)
Eosinophils Relative: 2.1 % (ref 0.0–5.0)
HCT: 25.5 % — ABNORMAL LOW (ref 36.0–46.0)
Hemoglobin: 8.1 g/dL — ABNORMAL LOW (ref 12.0–15.0)
Lymphocytes Relative: 28.4 % (ref 12.0–46.0)
Lymphs Abs: 1.2 10*3/uL (ref 0.7–4.0)
MCHC: 31.7 g/dL (ref 30.0–36.0)
MCV: 68.3 fl — ABNORMAL LOW (ref 78.0–100.0)
Monocytes Absolute: 0.4 10*3/uL (ref 0.1–1.0)
Monocytes Relative: 9.2 % (ref 3.0–12.0)
Neutro Abs: 2.5 10*3/uL (ref 1.4–7.7)
Neutrophils Relative %: 58.7 % (ref 43.0–77.0)
Platelets: 346 10*3/uL (ref 150.0–400.0)
RBC: 3.74 Mil/uL — ABNORMAL LOW (ref 3.87–5.11)
RDW: 17.6 % — ABNORMAL HIGH (ref 11.5–15.5)
WBC: 4.3 10*3/uL (ref 4.0–10.5)

## 2020-11-16 LAB — IBC + FERRITIN
Ferritin: 2.8 ng/mL — ABNORMAL LOW (ref 10.0–291.0)
Iron: 16 ug/dL — ABNORMAL LOW (ref 42–145)
Saturation Ratios: 3.3 % — ABNORMAL LOW (ref 20.0–50.0)
Transferrin: 345 mg/dL (ref 212.0–360.0)

## 2020-11-16 LAB — FERRITIN: Ferritin: 2.8 ng/mL — ABNORMAL LOW (ref 10.0–291.0)

## 2020-11-16 MED ORDER — OXYCODONE HCL 10 MG PO TABS: ORAL_TABLET | ORAL | 0 refills | Status: DC

## 2020-11-16 NOTE — Progress Notes (Signed)
Established Patient Office Visit  Subjective:  Patient ID: Toni Collins, female    DOB: 07-10-1965  Age: 55 y.o. MRN: [id number]  CC:  Chief Complaint  Patient presents with   Medication Refill    Pain management     HPI Toni Collins presents for chronic pain follow-up  .  She has history of fibromyalgia and has been maintained on Lyrica and oxycodone for many many years.  She tried multiple things previously without much success.  She has recently had some ongoing shoulder difficulties and had steroid injection recently which has helped somewhat.  She has no insurance coverage currently which has limited things like physical therapy and imaging.  Her Current pain is relatively well controlled.  Denies any adverse effects from medications.  Indication for chronic opioid: Chronic musculoskeletal pain.  History of fibromyalgia. Medication and dose: Oxycodone 10 mg every 6 hours # pills per month: 120 Last UDS date: 7/21 Opioid Treatment Agreement signed (Y/N): Yes Opioid Treatment Agreement last reviewed with patient: yes   [facility] reviewed this encounter (include red flags): yes     We noticed in looking through her labs that she had CBC back in January through another health system with hemoglobin 11.1 with MCV of 79.  She states she had hysterectomy around age 75.  She does not have any recent bloody stools.  Denies any abdominal pain or any nausea or vomiting.  Does have some fatigue issues.  Does not take any iron supplement.  Past Medical History:  Diagnosis Date   ADHD    Anxiety    Colon polyps    DEPRESSION 09/02/2008   Fibromyalgia    GRIEF REACTION 09/30/2009   INSOMNIA, CHRONIC 09/02/2008   PREDIABETES 03/10/2009    Past Surgical History:  Procedure Laterality Date   ABDOMINAL HYSTERECTOMY  2002   TAH   DIAGNOSTIC LAPAROSCOPY  1993, 1994, 1998, 2000   x4   TMJ ARTHROPLASTY     x2    Family History  Problem Relation Age of Onset   Arthritis Mother         rhematiod   Diabetes Maternal Grandmother    Depression Neg Hx        family    Social History   Socioeconomic History   Marital status: Married    Spouse name: Not on file   Number of children: Not on file   Years of education: Not on file   Highest education level: Not on file  Occupational History   Not on file  Tobacco Use   Smoking status: Never   Smokeless tobacco: Never  Vaping Use   Vaping Use: Never used  Substance and Sexual Activity   Alcohol use: No   Drug use: Not Currently   Sexual activity: Not on file  Other Topics Concern   Not on file  Social History Narrative   Not on file   Social Determinants of Health   Financial Resource Strain: Not on file  Food Insecurity: Not on file  Transportation Needs: Not on file  Physical Activity: Not on file  Stress: Not on file  Social Connections: Not on file  Intimate Partner Violence: Not on file    Outpatient Medications Prior to Visit  Medication Sig Dispense Refill   amphetamine-dextroamphetamine (ADDERALL) 20 MG tablet Take 1 tablet (20 mg total) by mouth in the morning, at noon, and at bedtime. 90 tablet 0   cariprazine (VRAYLAR) capsule Take 1 capsule (3 mg total)  by mouth daily. 30 capsule 1   nitroGLYCERIN (NITRODUR - DOSED IN MG/24 HR) 0.2 mg/hr patch APPLY 1/4 OF A PATCH TO SKIN ONCE DAILY. 30 patch 0   ondansetron (ZOFRAN-ODT) 4 MG disintegrating tablet DISSOLVE/TAKE 1 TABLET BY MOUTH EVERY 8 HOURS AS NEEDED FOR NAUSEA AND VOMITING 20 tablet 0   pantoprazole (PROTONIX) 40 MG tablet Take 1 tablet (40 mg total) by mouth daily. 30 tablet 5   pregabalin (LYRICA) 150 MG capsule Take 1 capsule (150 mg total) by mouth in the morning, at noon, and at bedtime. 270 capsule 1   QUEtiapine (SEROQUEL) 25 MG tablet Take 3 tablets (75 mg total) by mouth at bedtime. 270 tablet 0   sertraline (ZOLOFT) 100 MG tablet TAKE 2 TABLETS BY MOUTH EVERY DAY 180 tablet 0   Oxycodone HCl 10 MG TABS Take 1 tablet by mouth  every 6 hours 120 tablet 0   Oxycodone HCl 10 MG TABS Take 1 tablet by mouth every 6 hours.  May refill in one month. 120 tablet 0   Oxycodone HCl 10 MG TABS Take 1 tablet by mouth every 6 hours.  May refill in 2 months 120 tablet 0   No facility-administered medications prior to visit.    No Known Allergies  ROS Review of Systems  Constitutional:  Positive for fatigue.  Musculoskeletal:  Positive for arthralgias.  Neurological:  Negative for dizziness, weakness and light-headedness.     Objective:    Physical Exam Vitals reviewed.  Constitutional:      Appearance: Normal appearance.  Eyes:     Comments: Conjunctive are slightly pale  Cardiovascular:     Rate and Rhythm: Normal rate and regular rhythm.  Pulmonary:     Effort: Pulmonary effort is normal.     Breath sounds: Normal breath sounds.  Neurological:     Mental Status: She is alert.    BP 124/64 (BP Location: Left Arm, Patient Position: Sitting, Cuff Size: Normal)   Pulse 74   Temp 98.2 F (36.8 C) (Oral)   Wt 161 lb 4.8 oz (73.2 kg)   SpO2 100%   BMI 29.50 kg/m  Wt Readings from Last 3 Encounters:  11/16/20 161 lb 4.8 oz (73.2 kg)  11/01/20 166 lb 9.6 oz (75.6 kg)  08/23/20 168 lb 1.6 oz (76.2 kg)     Health Maintenance Due  Topic Date Due   COVID-19 Vaccine (1) Never done   HIV Screening  Never done   Hepatitis C Screening  Never done   PAP SMEAR-Modifier  Never done   COLONOSCOPY (Pts 45-102yrs Insurance coverage will need to be confirmed)  Never done   MAMMOGRAM  04/20/2018   Zoster Vaccines- Shingrix (1 of 2) Never done    There are no preventive care reminders to display for this patient.  Lab Results  Component Value Date   TSH 3.45 06/03/2015   Lab Results  Component Value Date   WBC 4.3 11/16/2020   HGB 8.1 Repeated and verified X2. (L) 11/16/2020   HCT 25.5 Repeated and verified X2. (L) 11/16/2020   MCV 68.3 Repeated and verified X2. (L) 11/16/2020   PLT 346.0 11/16/2020   Lab  Results  Component Value Date   NA 139 12/14/2015   K 4.0 12/14/2015   CO2 25 12/14/2015   GLUCOSE 89 12/14/2015   BUN 16 12/14/2015   CREATININE 0.78 12/14/2015   BILITOT 0.6 12/14/2015   ALKPHOS 83 12/14/2015   AST 19 12/14/2015   ALT 19  12/14/2015   PROT 7.4 12/14/2015   ALBUMIN 4.2 12/14/2015   CALCIUM 9.6 12/14/2015   GFR 83.91 12/14/2015   Lab Results  Component Value Date   CHOL 204 (H) 06/03/2015   Lab Results  Component Value Date   HDL 63.10 06/03/2015   Lab Results  Component Value Date   LDLCALC 127 (H) 06/03/2015   Lab Results  Component Value Date   TRIG 66.0 06/03/2015   Lab Results  Component Value Date   CHOLHDL 3 06/03/2015   No results found for: HGBA1C    Assessment & Plan:   #1 chronic pain syndrome with fibromyalgia.  Patient on Lyrica and oxycodone and has been on this regimen for many years. -Refill oxycodone for 3 months -Urine drug screen with next visit  #2 anemia by recent labs from outside source with microcytosis.  Patient had hysterectomy age 41.  Needs follow-up. -Repeat CBC today along with ferritin and serum iron -If labs confirm iron deficiency needs further GI evaluation.  Patient has concerns that she may not be absorbing iron and we explained that may be the case but will need to rule out any potential sort of loss first   Meds ordered this encounter  Medications   Oxycodone HCl 10 MG TABS    Sig: Take 1 tablet by mouth every 6 hours    Dispense:  120 tablet    Refill:  0   Oxycodone HCl 10 MG TABS    Sig: Take 1 tablet by mouth every 6 hours.  May refill in 2 months    Dispense:  120 tablet    Refill:  0   Oxycodone HCl 10 MG TABS    Sig: Take 1 tablet by mouth every 6 hours.  May refill in one month.    Dispense:  120 tablet    Refill:  0    Follow-up: Return in about 3 months (around 02/16/2021).    Carolann Littler, MD

## 2020-11-17 ENCOUNTER — Encounter: Payer: Self-pay | Admitting: Psychiatry

## 2020-11-17 ENCOUNTER — Telehealth: Payer: Self-pay | Admitting: Family Medicine

## 2020-11-17 ENCOUNTER — Ambulatory Visit (INDEPENDENT_AMBULATORY_CARE_PROVIDER_SITE_OTHER): Payer: Self-pay | Admitting: Psychiatry

## 2020-11-17 ENCOUNTER — Other Ambulatory Visit: Payer: Self-pay

## 2020-11-17 VITALS — BP 125/89 | HR 92

## 2020-11-17 DIAGNOSIS — G894 Chronic pain syndrome: Secondary | ICD-10-CM

## 2020-11-17 DIAGNOSIS — F902 Attention-deficit hyperactivity disorder, combined type: Secondary | ICD-10-CM

## 2020-11-17 DIAGNOSIS — F411 Generalized anxiety disorder: Secondary | ICD-10-CM

## 2020-11-17 DIAGNOSIS — F5105 Insomnia due to other mental disorder: Secondary | ICD-10-CM

## 2020-11-17 DIAGNOSIS — D649 Anemia, unspecified: Secondary | ICD-10-CM

## 2020-11-17 DIAGNOSIS — F4310 Post-traumatic stress disorder, unspecified: Secondary | ICD-10-CM

## 2020-11-17 DIAGNOSIS — F39 Unspecified mood [affective] disorder: Secondary | ICD-10-CM

## 2020-11-17 MED ORDER — PREGABALIN 150 MG PO CAPS: 150.0000 mg | ORAL_CAPSULE | Freq: Three times a day (TID) | ORAL | 1 refills | Status: DC

## 2020-11-17 MED ORDER — SERTRALINE HCL 100 MG PO TABS: 2.0000 | ORAL_TABLET | Freq: Every day | ORAL | 1 refills | Status: DC

## 2020-11-17 MED ORDER — AMPHETAMINE-DEXTROAMPHETAMINE 30 MG PO TABS: 15.0000 mg | ORAL_TABLET | Freq: Four times a day (QID) | ORAL | 0 refills | Status: DC

## 2020-11-17 MED ORDER — CARIPRAZINE HCL 3 MG PO CAPS: 3.0000 mg | ORAL_CAPSULE | Freq: Every day | ORAL | 2 refills | Status: DC

## 2020-11-17 NOTE — Telephone Encounter (Signed)
Patient called back regarding lab results.  Patient can be reached at [phone]
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
## 2020-11-18 NOTE — Telephone Encounter (Signed)
Discussed results with patient, patient expressed understanding. She is aware the referral has been placed. Nothing further needed.

## 2020-11-18 NOTE — Telephone Encounter (Signed)
LVM for call back to discuss results

## 2020-11-24 ENCOUNTER — Encounter: Payer: Self-pay | Admitting: Nurse Practitioner

## 2020-11-24 ENCOUNTER — Ambulatory Visit: Payer: Self-pay | Admitting: Nurse Practitioner

## 2020-11-24 ENCOUNTER — Other Ambulatory Visit (INDEPENDENT_AMBULATORY_CARE_PROVIDER_SITE_OTHER): Payer: Self-pay

## 2020-11-24 VITALS — BP 102/72 | HR 86 | Ht 62.0 in | Wt 162.4 lb

## 2020-11-24 DIAGNOSIS — K219 Gastro-esophageal reflux disease without esophagitis: Secondary | ICD-10-CM

## 2020-11-24 DIAGNOSIS — Z860100 Personal history of colon polyps, unspecified: Secondary | ICD-10-CM

## 2020-11-24 DIAGNOSIS — D509 Iron deficiency anemia, unspecified: Secondary | ICD-10-CM

## 2020-11-24 LAB — CBC
HCT: 23.5 % — CL (ref 36.0–46.0)
Hemoglobin: 7.7 g/dL — CL (ref 12.0–15.0)
MCHC: 32.9 g/dL (ref 30.0–36.0)
MCV: 66.2 fl — ABNORMAL LOW (ref 78.0–100.0)
Platelets: 290 10*3/uL (ref 150.0–400.0)
RBC: 3.56 Mil/uL — ABNORMAL LOW (ref 3.87–5.11)
RDW: 17.8 % — ABNORMAL HIGH (ref 11.5–15.5)
WBC: 3.4 10*3/uL — ABNORMAL LOW (ref 4.0–10.5)

## 2020-11-24 LAB — COMPREHENSIVE METABOLIC PANEL
ALT: 13 U/L (ref 0–35)
AST: 15 U/L (ref 0–37)
Albumin: 4 g/dL (ref 3.5–5.2)
Alkaline Phosphatase: 77 U/L (ref 39–117)
BUN: 15 mg/dL (ref 6–23)
CO2: 27 mEq/L (ref 19–32)
Calcium: 8.7 mg/dL (ref 8.4–10.5)
Chloride: 106 mEq/L (ref 96–112)
Creatinine, Ser: 0.78 mg/dL (ref 0.40–1.20)
GFR: 87.22 mL/min (ref >60.00)
Glucose, Bld: 107 mg/dL — ABNORMAL HIGH (ref 70–99)
Potassium: 3.8 mEq/L (ref 3.5–5.1)
Sodium: 140 mEq/L (ref 135–145)
Total Bilirubin: 0.2 mg/dL (ref 0.2–1.2)
Total Protein: 6.7 g/dL (ref 6.0–8.3)

## 2020-11-24 NOTE — Patient Instructions (Addendum)
LABS:  Lab work has been ordered for you today. Our lab is located in the basement. Press "B" on the elevator. The lab is located at the first door on the left as you exit the elevator.  HEALTHCARE LAWS AND MY CHART RESULTS: Due to recent changes in healthcare laws, you may see the results of your imaging and laboratory studies on MyChart before your provider has had a chance to review them.   We understand that in some cases there may be results that are confusing or concerning to you. Not all laboratory results come back in the same time frame and the provider may be waiting for multiple results in order to interpret others.  Please give [place] 48 hours in order for your provider to thoroughly review all the results before contacting the office for clarification of your results.   PROCEDURES: You have been scheduled for an EGD and Colonoscopy. Please follow the written instructions given to you at your visit today. If you use inhalers (even only as needed), please bring them with you on the day of your procedure.  Hold iron supplement 1 week prior to procedures, then restart after.  Please complete the stool cards before starting the iron. You can bring them back to the lab or mail it.  Take Nexium 20 mg tablet, 1 twice a day.  It was great seeing you today! Thank you for entrusting me with your care and choosing [facility].  Noralyn Pick, CRNP  Gastroesophageal Reflux Disease, Adult  Gastroesophageal reflux (GER) happens when acid from the stomach flows up into the tube that connects the mouth and the stomach (esophagus). Normally, food travels down the esophagus and stays in the stomach to be digested. With GER, food and stomach acid sometimes move back up into theesophagus. You may have a disease called gastroesophageal reflux disease (GERD) if the reflux: Happens often. Causes frequent or very bad symptoms. Causes problems such as damage to the esophagus. When this  happens, the esophagus becomes sore and swollen. Over time, GERD can make small holes (ulcers) in the lining of the esophagus. What are the causes? This condition is caused by a problem with the muscle between the esophagus and the stomach. When this muscle is weak or not normal, it does not close properlyto keep food and acid from coming back up from the stomach. The muscle can be weak because of: Tobacco use. Pregnancy. Having a certain type of hernia (hiatal hernia). Alcohol use. Certain foods and drinks, such as coffee, chocolate, onions, and peppermint. What increases the risk? Being overweight. Having a disease that affects your connective tissue. Taking NSAIDs, such a ibuprofen. What are the signs or symptoms? Heartburn. Difficult or painful swallowing. The feeling of having a lump in the throat. A bitter taste in the mouth. Bad breath. Having a lot of saliva. Having an upset or bloated stomach. Burping. Chest pain. Different conditions can cause chest pain. Make sure you see your doctor if you have chest pain. Shortness of breath or wheezing. A long-term cough or a cough at night. Wearing away of the surface of teeth (tooth enamel). Weight loss. How is this treated? Making changes to your diet. Taking medicine. Having surgery. Treatment will depend on how bad your symptoms are. Follow these instructions at home: Eating and drinking  Follow a diet as told by your doctor. You may need to avoid foods and drinks such as: Coffee and tea, with or without caffeine. Drinks that contain alcohol.  Energy drinks and sports drinks. Bubbly (carbonated) drinks or sodas. Chocolate and cocoa. Peppermint and mint flavorings. Garlic and onions. Horseradish. Spicy and acidic foods. These include peppers, chili powder, curry powder, vinegar, hot sauces, and BBQ sauce. Citrus fruit juices and citrus fruits, such as oranges, lemons, and limes. Tomato-based foods. These include red  sauce, chili, salsa, and pizza with red sauce. Fried and fatty foods. These include donuts, french fries, potato chips, and high-fat dressings. High-fat meats. These include hot dogs, rib eye steak, sausage, ham, and bacon. High-fat dairy items, such as whole milk, butter, and cream cheese. Eat small meals often. Avoid eating large meals. Avoid drinking large amounts of liquid with your meals. Avoid eating meals during the 2-3 hours before bedtime. Avoid lying down right after you eat. Do not exercise right after you eat.  Lifestyle  Do not smoke or use any products that contain nicotine or tobacco. If you need help quitting, ask your doctor. Try to lower your stress. If you need help doing this, ask your doctor. If you are overweight, lose an amount of weight that is healthy for you. Ask your doctor about a safe weight loss goal.  General instructions Pay attention to any changes in your symptoms. Take over-the-counter and prescription medicines only as told by your doctor. Do not take aspirin, ibuprofen, or other NSAIDs unless your doctor says it is okay. Wear loose clothes. Do not wear anything tight around your waist. Raise (elevate) the head of your bed about 6 inches (15 cm). You may need to use a wedge to do this. Avoid bending over if this makes your symptoms worse. Keep all follow-up visits. Contact a doctor if: You have new symptoms. You lose weight and you do not know why. You have trouble swallowing or it hurts to swallow. You have wheezing or a cough that keeps happening. You have a hoarse voice. Your symptoms do not get better with treatment. Get help right away if: You have sudden pain in your arms, neck, jaw, teeth, or back. You suddenly feel sweaty, dizzy, or light-headed. You have chest pain or shortness of breath. You vomit and the vomit is green, yellow, or black, or it looks like blood or coffee grounds. You faint. Your poop (stool) is red, bloody, or  black. You cannot swallow, drink, or eat. These symptoms may represent a serious problem that is an emergency. Do not wait to see if the symptoms will go away. Get medical help right away. Call your local emergency services (911 in the [place] Do not drive yourself to the hospital. Summary If a person has gastroesophageal reflux disease (GERD), food and stomach acid move back up into the esophagus and cause symptoms or problems such as damage to the esophagus. Treatment will depend on how bad your symptoms are. Follow a diet as told by your doctor. Take all medicines only as told by your doctor. This information is not intended to replace advice given to you by your health care provider. Make sure you discuss any questions you have with your healthcare provider. Document Revised: 11/23/2019 Document Reviewed: 11/23/2019 Elsevier Patient Education  [place].  If you are age 23 or older, your body mass index should be between 23-30. Your Body mass index is 29.7 kg/m. If this is out of the aforementioned range listed, please consider follow up with your Primary Care Provider.  If you are age 80 or younger, your body mass index should be between 19-25. Your Body mass  index is 29.7 kg/m. If this is out of the aformentioned range listed, please consider follow up with your Primary Care Provider.  The [facility] providers would like to encourage you to use [facility] to communicate with providers for non-urgent requests or questions.  Due to long hold times on the telephone, sending your provider a message by [facility] may be faster and more efficient way to get a response. Please allow 48 business hours for a response.  Please remember that this is for non-urgent requests/questions.

## 2020-11-24 NOTE — Progress Notes (Signed)
11/24/2020 Toni Collins [id number] 26-Apr-1966   CHIEF COMPLAINT: Iron deficiency anemia  HISTORY OF PRESENT ILLNESS: Toni Collins. Toni Collins is a 55 year old female with a past medical history of anxiety, depression, ADHD, fibromyalgia on Lyrica and oxycodone for 10+ years, prediabetes, colon polyps and endometriosis. S/P laparoscopy x 5 and total hysterectomy at the age of 29. She was referred to our office by Dr. Carolann Littler for further evaluation regarding iron deficiency anemia. She underwent a routine laboratory study 11/16/2020 which showed a Hemoglobin level 8.1 (Hg level 13.2 in 2017).  Hematocrit 25.5.  MCV 83.3.  Platelet 346.  Iron 16.  Ferritin 2.8.  She reports having a history of anemia secondary to menorrhagia which resolved after her hysterectomy.  She took oral iron approximately 15 years ago and stated she did not absorb it.  He endorses having heartburn daily for the past 10 years which is progressively worsened over the past year.  She often coughs when she eats any food.  No dysphagia.  She complains of intermittent stomach soreness.  She was prescribed Pantoprazole 40 mg once daily approximately 10 months ago which she discontinued because her heartburn worsened when she took it.  She started taking Nexium 20 mg OTC once daily and occasionally takes Alka-Seltzer with some relief.  She has nausea most days.  She intermittently vomits.  Last week, she developed severe nausea and stopped the car to vomit up the food she had just eaten.  No hematemesis at that time.  However, approximately 6 months ago, she vomited up partially digested food and she thought she saw darker red blood mixed in her emesis.  She went to [facility] ED in [place] at that time, but she left the ED without being evaluated due to the long wait.  No further hematemesis.  No NSAID use.  No alcohol use.  She typically passes a normal formed brown bowel movement daily.  She has occasional  constipation which is resolved by increasing her dietary fiber or if she eats foods prepared with all of oil.  No rectal bleeding or melena.  No lower abdominal pain.  She underwent a colonoscopy 25 years ago due to having abdominal pain, she reported a few colon polyps were removed.  She denies having any further colonoscopies since then.  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  No fever, sweats or chills.  No weight loss.  Her husband is present.  She is uninsured, she has applied for [employer] financial assistance.  CBC Latest Ref Rng & Units 11/16/2020 12/14/2015 02/23/2015  WBC 4.0 - 10.5 K/uL 4.3 5.2 4.2  Hemoglobin 12.0 - 15.0 g/dL 8.1 Repeated and verified X2.(L) 13.2 13.2  Hematocrit 36.0 - 46.0 % 25.5 Repeated and verified X2.(L) 39.2 40.0  Platelets 150.0 - 400.0 K/uL 346.0 222.0 247.0       Past Medical History:  Diagnosis Date   ADHD    Anxiety    Colon polyps    DEPRESSION 09/02/2008   Fibromyalgia    GRIEF REACTION 09/30/2009   INSOMNIA, CHRONIC 09/02/2008   PREDIABETES 03/10/2009   Past Surgical History:  Procedure Laterality Date   ABDOMINAL HYSTERECTOMY  2002   [place], 2000   x4   TMJ ARTHROPLASTY     x2   Social  history: She is married.  She is a non-smoker.  No alcohol use.  No drug use.  Family history: Mother deceased 23 infection, history of rheumatoid arthritis. Father deceased age 54 rare lung disease. Brother x 2 died from suicide. Sister x 2 healthy.   No Known Allergies   Outpatient Encounter Medications as of 11/24/2020  Medication Sig   amphetamine-dextroamphetamine (ADDERALL) 30 MG tablet Take 0.5 tablets by mouth 4 (four) times daily.   cariprazine (VRAYLAR) 3 MG capsule Take 1 capsule (3 mg total) by mouth daily.   ondansetron (ZOFRAN-ODT) 4 MG disintegrating tablet  DISSOLVE/TAKE 1 TABLET BY MOUTH EVERY 8 HOURS AS NEEDED FOR NAUSEA AND VOMITING   Oxycodone HCl 10 MG TABS Take 1 tablet by mouth every 6 hours   Oxycodone HCl 10 MG TABS Take 1 tablet by mouth every 6 hours.  May refill in 2 months   Oxycodone HCl 10 MG TABS Take 1 tablet by mouth every 6 hours.  May refill in one month.   pregabalin (LYRICA) 150 MG capsule Take 1 capsule (150 mg total) by mouth in the morning, at noon, and at bedtime.   sertraline (ZOLOFT) 100 MG tablet Take 2 tablets (200 mg total) by mouth daily.   pantoprazole (PROTONIX) 40 MG tablet Take 1 tablet (40 mg total) by mouth daily. (Patient not taking: No sig reported)   [DISCONTINUED] nitroGLYCERIN (NITRODUR - DOSED IN MG/24 HR) 0.2 mg/hr patch APPLY 1/4 OF A PATCH TO SKIN ONCE DAILY. (Patient not taking: Reported on 11/17/2020)   [DISCONTINUED] QUEtiapine (SEROQUEL) 25 MG tablet Take 3 tablets (75 mg total) by mouth at bedtime. (Patient not taking: Reported on 11/17/2020)   No facility-administered encounter medications on file as of 11/24/2020.    REVIEW OF SYSTEMS:  Gen: Denies fever, sweats or chills. No weight loss.  CV: Denies chest pain, palpitations or edema. Resp: See HPI. GI: See HPI. GU : Denies urinary burning, blood in urine, increased urinary frequency or incontinence. MS: Denies joint pain, muscles aches or weakness. Derm: Denies rash, itchiness, skin lesions or unhealing ulcers. Psych: + Anxiety and depression.  Heme: Denies bruising, bleeding. Neuro:  Denies headaches, dizziness or paresthesias. Endo:  Denies any problems with DM, thyroid or adrenal function.  PHYSICAL EXAM: BP 102/72 (BP Location: Left Arm, Patient Position: Sitting, Cuff Size: Normal)   Pulse 86   Ht 5\' 2"  (1.575 m)   Wt 162 lb 6 oz (73.7 kg)   SpO2 100%   BMI 29.70 kg/m   General: 55 year old female in no acute distress. Head: Normocephalic and atraumatic. Eyes:  Sclerae non-icteric, conjunctive pink. Ears: Normal auditory  acuity. Mouth: Dentition intact. No ulcers or lesions.  Neck: Supple, no lymphadenopathy or thyromegaly.  Lungs: Clear bilaterally to auscultation without wheezes, crackles or rhonchi. Heart: Regular rate and rhythm. No murmur, rub or gallop appreciated.  Abdomen: Soft, non distended.  Right lower quadrant tenderness, area with mild thickness palpated without a discrete mass.  No rebound or guarding.  No masses. No hepatosplenomegaly. Normoactive bowel sounds x 4 quadrants.  Rectal: Deferred. Musculoskeletal: Symmetrical with no gross deformities. Skin: Warm and dry. No rash or lesions on visible extremities. Extremities: No edema. Neurological: Alert oriented x 4, no focal deficits.  Psychological:  Alert and cooperative. Normal mood and affect.  ASSESSMENT AND PLAN:  37.  55 year old female with iron deficiency anemia.  Hemoglobin level of 8.1. Iron 16.  -Heme slides, to complete prior to initiating Ferrous Sulfate 325mg  one po qd (as  ordered by her PCP) -CBC, IgA, TtG -EGD and colonoscopy benefits and risks discussed including risk with sedation, risk of bleeding, perforation and infection  -Consider IV iron after [place] assistance activated   2.  Chronic GERD -Increase Nexium 20 mg OTC 1 p.o. twice daily -GERD handout -See plan in #1  3.  Nausea with intermittent vomiting, one episode of bloody emesis 6 months ago -CMP -Proceed with EGD as ordered above -Eventual abdominal imaging once [place] assistance activated  4.  Reported history of colon polyps per colonoscopy completed in [place] 25 years ago -Colonoscopy as ordered above  5.  Right lower quadrant tenderness on exam -Patient to call our office if she develops right lower quadrant abdominal pain -I will hold off on scheduling a CTAP at this time as the patient is uninsured, ever, she developed significant right lower quadrant abdominal pain she was instructed to go to the ED for further evaluation and CT  Further  recommendation to be determined after the above evaluation completed    CC:  Burchette, Alinda Sierras, MD

## 2020-11-25 ENCOUNTER — Other Ambulatory Visit: Payer: Self-pay

## 2020-11-25 DIAGNOSIS — D509 Iron deficiency anemia, unspecified: Secondary | ICD-10-CM

## 2020-11-25 LAB — IGA: Immunoglobulin A: 149 mg/dL (ref 47–310)

## 2020-11-25 LAB — TISSUE TRANSGLUTAMINASE ABS,IGG,IGA
(tTG) Ab, IgA: <1 U/mL
(tTG) Ab, IgG: <1 U/mL

## 2020-11-29 ENCOUNTER — Telehealth: Payer: Self-pay | Admitting: Nurse Practitioner

## 2020-11-29 NOTE — Telephone Encounter (Signed)
Left message to call back and ask to speak to me. 12/01/20 8:00 am slot on the [place] schedule of Dr Tarri Glenn held.

## 2020-11-29 NOTE — Telephone Encounter (Signed)
Toni Collins, I attempted to contact patient this am for an update. I left a msg on her mobile voicemail to call me back. Please contact her today. Refer to lab notes 11/24/2020. She needs EGD/colonoscopy earlier than her 12/21/2020 date with Dr. Candis Schatz as her Hg level continues to drop. Hg 7.7. Please see if any physician has availability for EGD/colonoscopy asap and if patient is willing to do as she is waiting for [facility] financial assistance. Let me know outcome. Thank you.

## 2020-11-30 ENCOUNTER — Emergency Department (HOSPITAL_BASED_OUTPATIENT_CLINIC_OR_DEPARTMENT_OTHER)
Admission: EM | Admit: 2020-11-30 | Discharge: 2020-12-01 | Disposition: A | Discharge status: Home / Self Care | Payer: Self-pay | Attending: Emergency Medicine | Admitting: Emergency Medicine

## 2020-11-30 ENCOUNTER — Other Ambulatory Visit: Payer: Self-pay

## 2020-11-30 ENCOUNTER — Other Ambulatory Visit (INDEPENDENT_AMBULATORY_CARE_PROVIDER_SITE_OTHER): Payer: Self-pay

## 2020-11-30 ENCOUNTER — Encounter (HOSPITAL_BASED_OUTPATIENT_CLINIC_OR_DEPARTMENT_OTHER): Payer: Self-pay

## 2020-11-30 DIAGNOSIS — Z5321 Procedure and treatment not carried out due to patient leaving prior to being seen by health care provider: Secondary | ICD-10-CM | POA: Insufficient documentation

## 2020-11-30 DIAGNOSIS — R5383 Other fatigue: Secondary | ICD-10-CM | POA: Insufficient documentation

## 2020-11-30 DIAGNOSIS — R799 Abnormal finding of blood chemistry, unspecified: Secondary | ICD-10-CM | POA: Insufficient documentation

## 2020-11-30 DIAGNOSIS — D509 Iron deficiency anemia, unspecified: Secondary | ICD-10-CM

## 2020-11-30 LAB — COMPREHENSIVE METABOLIC PANEL
ALT: 17 U/L (ref 0–44)
AST: 17 U/L (ref 15–41)
Albumin: 3.8 g/dL (ref 3.5–5.0)
Alkaline Phosphatase: 72 U/L (ref 38–126)
Anion gap: 7 (ref 5–15)
BUN: 16 mg/dL (ref 6–20)
CO2: 25 mmol/L (ref 22–32)
Calcium: 9.2 mg/dL (ref 8.9–10.3)
Chloride: 106 mmol/L (ref 98–111)
Creatinine, Ser: 0.82 mg/dL (ref 0.44–1.00)
GFR, Estimated: >60 mL/min (ref >60)
Glucose, Bld: 99 mg/dL (ref 70–99)
Potassium: 4.3 mmol/L (ref 3.5–5.1)
Sodium: 138 mmol/L (ref 135–145)
Total Bilirubin: 0.2 mg/dL — ABNORMAL LOW (ref 0.3–1.2)
Total Protein: 6.8 g/dL (ref 6.5–8.1)

## 2020-11-30 LAB — CBC WITH DIFFERENTIAL/PLATELET
Abs Immature Granulocytes: 0.01 10*3/uL (ref 0.00–0.07)
Basophils Absolute: 0 10*3/uL (ref 0.0–0.1)
Basophils Relative: 1 %
Eosinophils Absolute: 0.1 10*3/uL (ref 0.0–0.5)
Eosinophils Relative: 3 %
HCT: 25.1 % — ABNORMAL LOW (ref 36.0–46.0)
Hemoglobin: 7.4 g/dL — ABNORMAL LOW (ref 12.0–15.0)
Immature Granulocytes: 0 %
Lymphocytes Relative: 42 %
Lymphs Abs: 1.6 10*3/uL (ref 0.7–4.0)
MCH: 20.4 pg — ABNORMAL LOW (ref 26.0–34.0)
MCHC: 29.5 g/dL — ABNORMAL LOW (ref 30.0–36.0)
MCV: 69.1 fL — ABNORMAL LOW (ref 80.0–100.0)
Monocytes Absolute: 0.4 10*3/uL (ref 0.1–1.0)
Monocytes Relative: 10 %
Neutro Abs: 1.7 10*3/uL (ref 1.7–7.7)
Neutrophils Relative %: 44 %
Platelets: 345 10*3/uL (ref 150–400)
RBC: 3.63 MIL/uL — ABNORMAL LOW (ref 3.87–5.11)
RDW: 17.2 % — ABNORMAL HIGH (ref 11.5–15.5)
WBC: 3.9 10*3/uL — ABNORMAL LOW (ref 4.0–10.5)
nRBC: 0 % (ref 0.0–0.2)

## 2020-11-30 LAB — HEMOGLOBIN AND HEMATOCRIT, BLOOD
HCT: 23.5 % — CL (ref 36.0–46.0)
Hemoglobin: 7.5 g/dL — CL (ref 12.0–15.0)

## 2020-11-30 NOTE — ED Triage Notes (Signed)
Pt reports low hemoglobin over last 2 weeks. Recheck today showed hgb 7.5. Pt easily fatigued on exertion.

## 2020-12-01 ENCOUNTER — Telehealth: Payer: Self-pay | Admitting: Nurse Practitioner

## 2020-12-01 ENCOUNTER — Other Ambulatory Visit: Payer: Self-pay | Admitting: Nurse Practitioner

## 2020-12-01 ENCOUNTER — Other Ambulatory Visit: Payer: Self-pay

## 2020-12-01 ENCOUNTER — Other Ambulatory Visit (INDEPENDENT_AMBULATORY_CARE_PROVIDER_SITE_OTHER): Payer: Self-pay

## 2020-12-01 DIAGNOSIS — D509 Iron deficiency anemia, unspecified: Secondary | ICD-10-CM | POA: Insufficient documentation

## 2020-12-01 DIAGNOSIS — K219 Gastro-esophageal reflux disease without esophagitis: Secondary | ICD-10-CM

## 2020-12-01 DIAGNOSIS — Z86010 Personal history of colonic polyps: Secondary | ICD-10-CM

## 2020-12-01 DIAGNOSIS — D508 Other iron deficiency anemias: Secondary | ICD-10-CM

## 2020-12-01 LAB — HEMOCCULT SLIDES (X 3 CARDS)
Fecal Occult Blood: NEGATIVE
OCCULT 1: NEGATIVE
OCCULT 2: NEGATIVE
OCCULT 3: NEGATIVE
OCCULT 4: NEGATIVE
OCCULT 5: NEGATIVE

## 2020-12-01 MED ORDER — ESOMEPRAZOLE MAGNESIUM 40 MG PO CPDR: 40.0000 mg | DELAYED_RELEASE_CAPSULE | Freq: Two times a day (BID) | ORAL | 3 refills | Status: DC

## 2020-12-01 MED ORDER — SUCRALFATE 1 G PO TABS: 1.0000 g | ORAL_TABLET | Freq: Three times a day (TID) | ORAL | 0 refills | Status: DC

## 2020-12-01 NOTE — Progress Notes (Signed)
Order for IV iron x 2 doses one week apart entered.

## 2020-12-01 NOTE — Telephone Encounter (Signed)
Patient calls back today. No sooner [facility] openings are available right now for a 1 hour procedure. Patient went to the ED last night. CBC repeated. Her hgb was 7.4. she did not stay for treatment because she was so tired. Spoke with her provider Carl Best, NP. Patient offered an iron infusion. She is agreeable to this. Jaclyn Shaggy will arrange.

## 2020-12-01 NOTE — Telephone Encounter (Signed)
Refer to office consult 11/24/2020. Patient evaluated for severe IDA. Hg 8.1 -> 7.5. EGD and colonoscopy scheduled with Dr. Candis Schatz on 12/21/2020. We tried to schedule her for an EGD/colonoscopy with Dr. Tarri Glenn on 7/7 when this time slot opened up earlier in the week but the patient did not respond to our messages in time to accept this date. A repeat Hg level 7/5 dropped down to 7.4 (labs were done in the ED but she left prior to being evaluated by the ER physician or PA.  I communicated with Dr. Candis Schatz who recommend high dose PPI bid and Carafate, continue oral iron bid and proceed with EGD/colonoscopy on 12/21/2020 as scheduled but we will attempt to schedule earlier if an opening occurs in the endo center. In the interim, I ordered IV iron if affordable. [name] is well aware to go back  to the ED if she develops severe fatigue, CP, SOB or dizziness. If her Hg level drops further and/or if she is more symptomatic she will need to go to the ED for a blood transfusion and possible admission. If she is admitted to the hospital, an EGD and colonoscopy would be done as an inpatient. Her Heme slides came back as negative for occult blood. Plan to repeat H/H on Monday 12/05/2020. She will need a hematology consult, not yet done as the patient remains uninsured, awaiting for [facility] financial assistance.

## 2020-12-01 NOTE — ED Notes (Signed)
Patient called several times for VS Reassessment with no Response. Patient will be discharged appropriately.

## 2020-12-01 NOTE — Addendum Note (Signed)
Addended by: Virgina Evener A on: 12/01/2020 02:04 PM   Modules accepted: Orders

## 2020-12-02 NOTE — Telephone Encounter (Signed)
Spoke to Toni Collins.  She stated she is feeling a bit better today but continues to have fatigue and is sleeping more.  She is in no acute distress.  No chest pain or shortness of breath.  She is taking Nexium 40 mg twice daily, Carafate 1 g 4 times daily and ferrous sulfate 325 mg 1 p.o. twice daily.  No constipation yet.  I advised for her to take MiraLAX if she develops constipation or reduce the Carafate to twice daily.  She agrees to have repeat H&H done in our lab Monday or Tuesday of next week.  She will go back to the ER if she has profound fatigue, chest pain or shortness of breath.  I answered all her questions at this time.

## 2020-12-05 ENCOUNTER — Other Ambulatory Visit (INDEPENDENT_AMBULATORY_CARE_PROVIDER_SITE_OTHER): Payer: Self-pay

## 2020-12-05 DIAGNOSIS — D509 Iron deficiency anemia, unspecified: Secondary | ICD-10-CM

## 2020-12-05 LAB — HEMOGLOBIN AND HEMATOCRIT, BLOOD
HCT: 24.5 % — ABNORMAL LOW (ref 36.0–46.0)
Hemoglobin: 7.6 g/dL — CL (ref 12.0–15.0)

## 2020-12-05 NOTE — Telephone Encounter (Signed)
See other numerous communications with patient since this msg was received.

## 2020-12-09 ENCOUNTER — Encounter: Payer: Self-pay | Admitting: Family Medicine

## 2020-12-12 ENCOUNTER — Ambulatory Visit: Payer: Self-pay

## 2020-12-12 ENCOUNTER — Other Ambulatory Visit: Payer: Self-pay

## 2020-12-12 ENCOUNTER — Ambulatory Visit (INDEPENDENT_AMBULATORY_CARE_PROVIDER_SITE_OTHER): Payer: Self-pay | Admitting: *Deleted

## 2020-12-12 ENCOUNTER — Telehealth: Payer: Self-pay

## 2020-12-12 VITALS — BP 115/82 | HR 87 | Temp 97.9°F | Resp 16

## 2020-12-12 DIAGNOSIS — D509 Iron deficiency anemia, unspecified: Secondary | ICD-10-CM

## 2020-12-12 DIAGNOSIS — D508 Other iron deficiency anemias: Secondary | ICD-10-CM

## 2020-12-12 MED ORDER — SODIUM CHLORIDE 0.9 % IV SOLN
510.0000 mg | Freq: Once | INTRAVENOUS | Status: AC
  Administered 2020-12-12: 510 mg via INTRAVENOUS
  Filled 2020-12-12: qty 17

## 2020-12-12 NOTE — Progress Notes (Signed)
Diagnosis: Iron Deficiency Anemia  Provider:  Marshell Garfinkel, MD  Procedure: Infusion  IV Type: Peripheral, IV Location: L Antecubital  Feraheme (Ferumoxytol), Dose: 510 mg  Infusion Start Time: 1447pm  Infusion Stop Time: 1510  Post Infusion IV Care: Observation period completed  Discharge: Condition: Good, Destination: Home . AVS provided to patient.   Performed by:  Oren Beckmann, RN

## 2020-12-12 NOTE — Telephone Encounter (Signed)
Attempted to contact patient to schedule next infusion. Unable to leave message as voicemail cuts/deletes messages in the middle of recording

## 2020-12-12 NOTE — Progress Notes (Deleted)
Diagnosis: {Diagnosis:25401}  Provider:  Marshell Garfinkel, MD  Procedure: {Injection/Infusion:25339}  {Medications:25396}, {Dose:25397}, {Site:12696:::1}  Discharge: {Condition:19696:::1}, {Destination:18313:::1} . AVS provided to patient.   Performed by:

## 2020-12-16 ENCOUNTER — Other Ambulatory Visit: Payer: Self-pay

## 2020-12-16 ENCOUNTER — Telehealth: Payer: Self-pay | Admitting: Psychiatry

## 2020-12-16 DIAGNOSIS — F902 Attention-deficit hyperactivity disorder, combined type: Secondary | ICD-10-CM

## 2020-12-16 NOTE — Telephone Encounter (Signed)
Next visit is 05/15/21. Requesting refill on Adderall 30 mg called to:  [id number]- [place], [place] - [address]  Phone:  [phone] Fax:  [phone]

## 2020-12-16 NOTE — Telephone Encounter (Signed)
Pended.

## 2020-12-17 MED ORDER — AMPHETAMINE-DEXTROAMPHETAMINE 30 MG PO TABS: 15.0000 mg | ORAL_TABLET | Freq: Four times a day (QID) | ORAL | 0 refills | Status: DC

## 2020-12-19 ENCOUNTER — Ambulatory Visit: Payer: Self-pay

## 2020-12-19 ENCOUNTER — Encounter: Payer: Self-pay | Admitting: Pulmonary Disease

## 2020-12-19 ENCOUNTER — Encounter: Payer: Self-pay | Admitting: Nurse Practitioner

## 2020-12-20 ENCOUNTER — Ambulatory Visit: Payer: Self-pay

## 2020-12-20 ENCOUNTER — Telehealth: Payer: Self-pay

## 2020-12-20 ENCOUNTER — Telehealth: Payer: Self-pay | Admitting: Gastroenterology

## 2020-12-20 MED ORDER — ONDANSETRON 4 MG PO TBDP: ORAL_TABLET | ORAL | 0 refills | Status: DC

## 2020-12-20 NOTE — Telephone Encounter (Signed)
Called and spoke with patient this afternoon after RN Monday had called and spoken with them about Zofran being sent. Patient has not had any bowel movements for the last couple of days. She has had a significant amount of bloating. She completed drinking 64 ounces of Gatorade in an effort of trying to be fully hydrated however she has not initiated the 232 ounce MiraLAX Gatorade combinations as of yet. The patient previously has been on Linzess that has helped "clean her out" but she has not taken that in quite a while. I have asked the patient to go ahead and ensure that all of the MiraLAX/Gatorade combination is chilled adequately. I have asked him to go pick up Zofran. She may use Zofran 4 mg every 6-8 hours to help with nausea. The patient will initiate her MiraLAX/Gatorade combination and drink every 15 to 25 minutes 8 ounces in an effort of trying to space things out to hopefully allow her to be able to tolerate the amount of liquid she is received. The patient will also initiate her morning preparation earlier around 7:30 AM tomorrow morning instead of 930 that she can complete the second half of her MiraLAX/Gatorade combination. She may use a second dose of Linzess tomorrow if necessary. Patient and husband appreciative for call back. Sarah or Freight forwarder, can you please reach out to the patient tomorrow in the morning to see how she is doing to make sure that she is doing well and will be able to get her procedures done tomorrow afternoon. Thanks. GM

## 2020-12-20 NOTE — Telephone Encounter (Signed)
I have sent a new Rx for Zofran (reorder). Please let her know to chill the preparation and drink slower as well. Hopefully the Zofran will get her through this. She may use 4 mg every 6-8 hours as needed. Thanks. GM

## 2020-12-20 NOTE — Telephone Encounter (Signed)
Called the patient's spouse back. Rx for Zofran sent into the pharmacy. Further investigation with the spouse I found that the patient has chronic constipation and she hasn't had a BM for 4 days. After taking the first part of her miralax prep within an hour and a half she hasn't had any results. Will make Dr. Rush Landmark aware.

## 2020-12-20 NOTE — Telephone Encounter (Signed)
Patients spouse called to inform that the patient is having a hard time with consuming a lot of the fluids

## 2020-12-20 NOTE — Telephone Encounter (Signed)
Spoke with patient's husband. Pt started prepping this afternoon is and feeling bloated and nauseated. He says she's worried about being able to take all of the fluids. I encouraged her to take her time and  she could take her ODT zofran. She is unfortunately out of this rx that was prescribed by Dr. Elease Hashimoto. Dr. Candis Schatz is off this afternoon will let Dr. Rush Landmark know.

## 2020-12-20 NOTE — Telephone Encounter (Signed)
See separate telephone message. GM

## 2020-12-21 ENCOUNTER — Encounter: Payer: Self-pay | Admitting: Family Medicine

## 2020-12-21 ENCOUNTER — Telehealth: Payer: Self-pay | Admitting: *Deleted

## 2020-12-21 ENCOUNTER — Encounter: Payer: Self-pay | Admitting: Gastroenterology

## 2020-12-21 ENCOUNTER — Other Ambulatory Visit: Payer: Self-pay

## 2020-12-21 ENCOUNTER — Ambulatory Visit (AMBULATORY_SURGERY_CENTER): Payer: Self-pay | Admitting: Gastroenterology

## 2020-12-21 VITALS — BP 104/61 | HR 67 | Temp 98.4°F | Resp 18 | Wt 162.0 lb

## 2020-12-21 DIAGNOSIS — K317 Polyp of stomach and duodenum: Secondary | ICD-10-CM

## 2020-12-21 DIAGNOSIS — K2210 Ulcer of esophagus without bleeding: Secondary | ICD-10-CM

## 2020-12-21 DIAGNOSIS — K2100 Gastro-esophageal reflux disease with esophagitis, without bleeding: Secondary | ICD-10-CM

## 2020-12-21 DIAGNOSIS — Z1211 Encounter for screening for malignant neoplasm of colon: Secondary | ICD-10-CM

## 2020-12-21 DIAGNOSIS — R1319 Other dysphagia: Secondary | ICD-10-CM

## 2020-12-21 DIAGNOSIS — K449 Diaphragmatic hernia without obstruction or gangrene: Secondary | ICD-10-CM

## 2020-12-21 DIAGNOSIS — D508 Other iron deficiency anemias: Secondary | ICD-10-CM

## 2020-12-21 MED ORDER — SODIUM CHLORIDE 0.9 % IV SOLN: 500.0000 mL | Freq: Once | INTRAVENOUS | Status: DC

## 2020-12-21 NOTE — Op Note (Addendum)
[name] Patient Name: Toni Collins Procedure Date: 12/21/2020 2:11 PM MRN: [id number] Endoscopist: Nicki Reaper E. Candis Schatz , MD Age: 55 Referring MD:  Date of Birth: 17-Nov-1965 Gender: Female Account #: [id number] Procedure:                Upper GI endoscopy Indications:              Iron deficiency anemia, Dysphagia, Heartburn Medicines:                Monitored Anesthesia Care Procedure:                Pre-Anesthesia Assessment:                           - Prior to the procedure, a History and Physical                            was performed, and patient medications and                            allergies were reviewed. The patient's tolerance of                            previous anesthesia was also reviewed. The risks                            and benefits of the procedure and the sedation                            options and risks were discussed with the patient.                            All questions were answered, and informed consent                            was obtained. Prior Anticoagulants: The patient has                            taken no previous anticoagulant or antiplatelet                            agents. ASA Grade Assessment: II - A patient with                            mild systemic disease. After reviewing the risks                            and benefits, the patient was deemed in                            satisfactory condition to undergo the procedure.                           After obtaining informed consent, the endoscope was  passed under direct vision. Throughout the                            procedure, the patient's blood pressure, pulse, and                            oxygen saturations were monitored continuously. The                            GIF [id number] was introduced through the                            mouth, and advanced to the third part of duodenum.                            The  upper GI endoscopy was accomplished without                            difficulty. The patient tolerated the procedure                            well. Scope In: Scope Out: Findings:                 LA Grade D (one or more mucosal breaks involving at                            least 75% of esophageal circumference) esophagitis                            with no bleeding was found 29 to 33 cm from the                            incisors. A pseudodiverticulum was noted in the                            area of inflammation/ulceration. The area of                            esophagitis was associated with mild luminal                            narrowing of about 3-4cm in length. The scope                            traversed this section easily with no resistance.                            Biopsies were taken of the inflamed mucosa with a                            cold forceps for histology. Estimated blood loss  was minimal.                           The exam of the esophagus was otherwise normal.                           A 5 cm hiatal hernia was present.                           A single 10 mm pedunculated polyp was found in the                            gastric body. The polyp was removed with a cold                            snare. Resection and retrieval were complete.                            Estimated blood loss was minimal.                           The exam of the stomach was otherwise normal.                           Biopsies were taken with a cold forceps in the                            gastric antrum for Helicobacter pylori testing.                            Estimated blood loss was minimal.                           The examined duodenum was normal. Biopsies for                            histology were taken with a cold forceps for                            evaluation of celiac disease. Estimated blood loss                            was  minimal. Complications:            No immediate complications. Estimated Blood Loss:     Estimated blood loss was minimal. Impression:               - LA Grade D reflux esophagitis with luminal                            narrowing and no bleeding. Biopsied. It is possible                            the esophagitis is contributing to the iron  deficiency anemia. There was luminal narrowing in                            the region of the esophagitis, likely causing the                            patient's dysphagia.                           - 5 cm hiatal hernia.                           - A single gastric polyp. Resected and retrieved.                           - Normal examined duodenum. Biopsied.                           - Biopsies were taken with a cold forceps for                            Helicobacter pylori testing. Recommendation:           - Patient has a contact number available for                            emergencies. The signs and symptoms of potential                            delayed complications were discussed with the                            patient. Return to normal activities tomorrow.                            Written discharge instructions were provided to the                            patient.                           - Resume previous diet.                           - Use Aciphex (rabeprazole) 40 mg PO BID for 8                            weeks. Stop Nexium.                           - Continue Carafate QID for 4 weeks.                           - Repeat upper endoscopy in 8 weeks to check  healing and to potentially dilate stricture.                           ADDENDUM: After talking with the patient after the                            procedure, she informed me that she had not been                            taking the Nexium twice a day as prescribed, and                            was not taking  the carafate. She had been taking                            the Nexium occasionally/as needed. Because of this,                            I don't think we could consider this a therapeutic                            failure, and I recommended that she just take the                            Nexium twice daily for 8 weeks and reassess for                            healing. We can try the Aciphex if there is still                            esophagitis at the time of her repeat EGD. Tanasha Menees E. Candis Schatz, MD 12/21/2020 3:24:24 PM This report has been signed electronically.

## 2020-12-21 NOTE — Op Note (Signed)
[place] Patient Name: Toni Collins Procedure Date: 12/21/2020 2:05 PM MRN: [id number] Endoscopist: Nicki Reaper E. Candis Schatz , MD Age: 55 Referring MD:  Date of Birth: July 10, 1965 Gender: Female Account #: [id number] Procedure:                Colonoscopy Indications:              Screening for colorectal malignant neoplasm Medicines:                Monitored Anesthesia Care Procedure:                Pre-Anesthesia Assessment:                           - Prior to the procedure, a History and Physical                            was performed, and patient medications and                            allergies were reviewed. The patient's tolerance of                            previous anesthesia was also reviewed. The risks                            and benefits of the procedure and the sedation                            options and risks were discussed with the patient.                            All questions were answered, and informed consent                            was obtained. Prior Anticoagulants: The patient has                            taken no previous anticoagulant or antiplatelet                            agents. ASA Grade Assessment: II - A patient with                            mild systemic disease. After reviewing the risks                            and benefits, the patient was deemed in                            satisfactory condition to undergo the procedure.                           After obtaining informed consent, the colonoscope  was passed under direct vision. Throughout the                            procedure, the patient's blood pressure, pulse, and                            oxygen saturations were monitored continuously. The                            CF [id number] was introduced through the anus                            and advanced to the the terminal ileum, with                             identification of the appendiceal orifice and IC                            valve. The colonoscopy was performed without                            difficulty. The patient tolerated the procedure                            well. The quality of the bowel preparation was                            inadequate. The terminal ileum, ileocecal valve,                            appendiceal orifice, and rectum were photographed.                            The bowel preparation used was Miralax via split                            dose instruction. Scope In: 2:54:16 PM Scope Out: 3:10:15 PM Scope Withdrawal Time: 0 hours 7 minutes 59 seconds  Total Procedure Duration: 0 hours 15 minutes 59 seconds  Findings:                 The perianal and digital rectal examinations were                            normal. Pertinent negatives include normal                            sphincter tone and no palpable rectal lesions.                           The colon (entire examined portion) appeared normal.                           The terminal ileum appeared normal.  The retroflexed view of the distal rectum and anal                            verge was normal and showed no anal or rectal                            abnormalities. Complications:            No immediate complications. Estimated Blood Loss:     Estimated blood loss: none. Impression:               - Preparation of the colon was inadequate.                           - The entire examined colon is normal.                           - The examined portion of the ileum was normal.                           - The distal rectum and anal verge are normal on                            retroflexion view.                           - No specimens collected. Recommendation:           - Patient has a contact number available for                            emergencies. The signs and symptoms of potential                             delayed complications were discussed with the                            patient. Return to normal activities tomorrow.                            Written discharge instructions were provided to the                            patient.                           - Resume previous diet.                           - Continue present medications.                           - Repeat colonoscopy in 3 years because the bowel                            preparation was suboptimal. Dawsen Krieger E. Candis Schatz, MD 12/21/2020 3:28:04 PM This report  has been signed electronically.

## 2020-12-21 NOTE — Progress Notes (Signed)
To PACU, VSS. Report to [email or web address] 

## 2020-12-21 NOTE — Telephone Encounter (Signed)
Chart entered in error

## 2020-12-21 NOTE — Patient Instructions (Addendum)
Resume previous diet Continue carafate four times daily for 4 weeks Repeat upper endoscopy in 8 weeks to check healing and potentially dilate Await pathology results Repeat EGD in 8 weeks  YOU HAD AN ENDOSCOPIC PROCEDURE TODAY AT [facility]:   Refer to the procedure report that was given to you for any specific questions about what was found during the examination.  If the procedure report does not answer your questions, please call your gastroenterologist to clarify.  If you requested that your care partner not be given the details of your procedure findings, then the procedure report has been included in a sealed envelope for you to review at your convenience later.  YOU SHOULD EXPECT: Some feelings of bloating in the abdomen. Passage of more gas than usual.  Walking can help get rid of the air that was put into your GI tract during the procedure and reduce the bloating. If you had a lower endoscopy (such as a colonoscopy or flexible sigmoidoscopy) you may notice spotting of blood in your stool or on the toilet paper. If you underwent a bowel prep for your procedure, you may not have a normal bowel movement for a few days.  Please Note:  You might notice some irritation and congestion in your nose or some drainage.  This is from the oxygen used during your procedure.  There is no need for concern and it should clear up in a day or so.  SYMPTOMS TO REPORT IMMEDIATELY:  Following lower endoscopy (colonoscopy or flexible sigmoidoscopy):  Excessive amounts of blood in the stool  Significant tenderness or worsening of abdominal pains  Swelling of the abdomen that is new, acute  Fever of 100F or higher  Following upper endoscopy (EGD)  Vomiting of blood or coffee ground material  New chest pain or pain under the shoulder blades  Painful or persistently difficult swallowing  New shortness of breath  Fever of 100F or higher  Black, tarry-looking stools  For urgent or  emergent issues, a gastroenterologist can be reached at any hour by calling [phone]. Do not use MyChart messaging for urgent concerns.   DIET:  We do recommend a small meal at first, but then you may proceed to your regular diet.  Drink plenty of fluids but you should avoid alcoholic beverages for 24 hours.  ACTIVITY:  You should plan to take it easy for the rest of today and you should NOT DRIVE or use heavy machinery until tomorrow (because of the sedation medicines used during the test).    FOLLOW UP: Our staff will call the number listed on your records 48-72 hours following your procedure to check on you and address any questions or concerns that you may have regarding the information given to you following your procedure. If we do not reach you, we will leave a message.  We will attempt to reach you two times.  During this call, we will ask if you have developed any symptoms of COVID 19. If you develop any symptoms (ie: fever, flu-like symptoms, shortness of breath, cough etc.) before then, please call [phone].  If you test positive for Covid 19 in the 2 weeks post procedure, please call and report this information to [place].    If any biopsies were taken you will be contacted by phone or by letter within the next 1-3 weeks.  Please call us at [phone] if you have not heard about the biopsies in 3 weeks.   SIGNATURES/CONFIDENTIALITY: You and/or  your care partner have signed paperwork which will be entered into your electronic medical record.  These signatures attest to the fact that that the information above on your After Visit Summary has been reviewed and is understood.  Full responsibility of the confidentiality of this discharge information lies with you and/or your care-partner.

## 2020-12-21 NOTE — Progress Notes (Signed)
Called to room to assist during endoscopic procedure.  Patient ID and intended procedure confirmed with present staff. Received instructions for my participation in the procedure from the performing physician.  

## 2020-12-21 NOTE — Telephone Encounter (Signed)
Called and spoke with the pts husband this morning. She has been able to complete the prep so far with positive results. Will see the patient this afternoon for her colonoscopy.

## 2020-12-21 NOTE — Progress Notes (Signed)
VS by [place]  I have reviewed the patient's medical history in detail and updated the computerized patient record.  

## 2020-12-23 ENCOUNTER — Other Ambulatory Visit: Payer: Self-pay

## 2020-12-23 ENCOUNTER — Encounter: Payer: Self-pay | Admitting: Family Medicine

## 2020-12-23 ENCOUNTER — Telehealth: Payer: Self-pay

## 2020-12-23 DIAGNOSIS — D649 Anemia, unspecified: Secondary | ICD-10-CM

## 2020-12-23 DIAGNOSIS — G894 Chronic pain syndrome: Secondary | ICD-10-CM

## 2020-12-23 MED ORDER — PREGABALIN 150 MG PO CAPS
150.0000 mg | ORAL_CAPSULE | Freq: Three times a day (TID) | ORAL | 1 refills | Status: DC
Start: 2020-12-23 — End: 2021-06-05

## 2020-12-23 NOTE — Telephone Encounter (Signed)
  Follow up Call-  Call back number 12/21/2020  Post procedure Call Back phone  # [phone]  Permission to leave phone message No  Some recent data might be hidden     Patient questions:  Do you have a fever, pain , or abdominal swelling? No. Pain Score  0 *  Have you tolerated food without any problems? Yes.    Have you been able to return to your normal activities? Yes.    Do you have any questions about your discharge instructions: Diet   No. Medications  No. Follow up visit  No.  Do you have questions or concerns about your Care? No.  Actions: * If pain score is 4 or above: No action needed, pain <4.  Have you developed a fever since your procedure? no  2.   Have you had an respiratory symptoms (SOB or cough) since your procedure? no  3.   Have you tested positive for COVID 19 since your procedure no  4.   Have you had any family members/close contacts diagnosed with the COVID 19 since your procedure?  no   If yes to any of these questions please route to Joylene John, RN and Joella Prince, RN

## 2020-12-27 ENCOUNTER — Telehealth: Payer: Self-pay | Admitting: Psychiatry

## 2020-12-27 NOTE — Telephone Encounter (Signed)
FYI

## 2020-12-27 NOTE — Telephone Encounter (Signed)
Toni Collins has been re-approved for Pt. Assistance for Lyrica until 12/27/21.

## 2020-12-28 NOTE — Telephone Encounter (Signed)
Information updated

## 2020-12-30 ENCOUNTER — Telehealth: Payer: Self-pay

## 2020-12-30 NOTE — Telephone Encounter (Signed)
Toni Collins The patient wants to know when she needs to have a CBC drawn again. Feraheme was 12/12/20. She has a procedure with Dr Candis Schatz in September.

## 2021-01-01 NOTE — Telephone Encounter (Signed)
See other msg, pt to have CBC, iron, iron saturation, TIBC and  ferritin level this week.

## 2021-01-02 ENCOUNTER — Other Ambulatory Visit (INDEPENDENT_AMBULATORY_CARE_PROVIDER_SITE_OTHER): Payer: Self-pay

## 2021-01-02 ENCOUNTER — Other Ambulatory Visit: Payer: Self-pay | Admitting: *Deleted

## 2021-01-02 DIAGNOSIS — D508 Other iron deficiency anemias: Secondary | ICD-10-CM

## 2021-01-02 DIAGNOSIS — D509 Iron deficiency anemia, unspecified: Secondary | ICD-10-CM

## 2021-01-02 LAB — CBC WITH DIFFERENTIAL/PLATELET
Basophils Absolute: 0 10*3/uL (ref 0.0–0.1)
Basophils Relative: 0.8 % (ref 0.0–3.0)
Eosinophils Absolute: 0.1 10*3/uL (ref 0.0–0.7)
Eosinophils Relative: 1.6 % (ref 0.0–5.0)
HCT: 31.8 % — ABNORMAL LOW (ref 36.0–46.0)
Hemoglobin: 10.1 g/dL — ABNORMAL LOW (ref 12.0–15.0)
Lymphocytes Relative: 20.6 % (ref 12.0–46.0)
Lymphs Abs: 1.2 10*3/uL (ref 0.7–4.0)
MCHC: 31.9 g/dL (ref 30.0–36.0)
MCV: 70 fl — ABNORMAL LOW (ref 78.0–100.0)
Monocytes Absolute: 0.4 10*3/uL (ref 0.1–1.0)
Monocytes Relative: 6.5 % (ref 3.0–12.0)
Neutro Abs: 4.2 10*3/uL (ref 1.4–7.7)
Neutrophils Relative %: 70.5 % (ref 43.0–77.0)
Platelets: 289 10*3/uL (ref 150.0–400.0)
RBC: 4.54 Mil/uL (ref 3.87–5.11)
RDW: 28.1 % — ABNORMAL HIGH (ref 11.5–15.5)
WBC: 5.9 10*3/uL (ref 4.0–10.5)

## 2021-01-02 LAB — IBC + FERRITIN
Ferritin: 42.8 ng/mL (ref 10.0–291.0)
Iron: 24 ug/dL — ABNORMAL LOW (ref 42–145)
Saturation Ratios: 6 % — ABNORMAL LOW (ref 20.0–50.0)
Transferrin: 288 mg/dL (ref 212.0–360.0)

## 2021-01-02 NOTE — Progress Notes (Signed)
Lab orders placed in Epic, attempted to contact patient. Left message on voicemail, requested a call bacl if needed.

## 2021-01-03 LAB — IRON AND TIBC
Iron Saturation: 8 % — CL (ref 15–55)
Iron: 25 ug/dL — ABNORMAL LOW (ref 27–159)
Total Iron Binding Capacity: 329 ug/dL (ref 250–450)
UIBC: 304 ug/dL (ref 131–425)

## 2021-01-05 ENCOUNTER — Other Ambulatory Visit: Payer: Self-pay | Admitting: *Deleted

## 2021-01-05 DIAGNOSIS — D509 Iron deficiency anemia, unspecified: Secondary | ICD-10-CM

## 2021-01-05 DIAGNOSIS — D508 Other iron deficiency anemias: Secondary | ICD-10-CM

## 2021-01-06 ENCOUNTER — Other Ambulatory Visit: Payer: Self-pay

## 2021-01-06 DIAGNOSIS — D509 Iron deficiency anemia, unspecified: Secondary | ICD-10-CM

## 2021-01-16 ENCOUNTER — Telehealth: Payer: Self-pay | Admitting: Psychiatry

## 2021-01-16 ENCOUNTER — Telehealth: Payer: Self-pay

## 2021-01-16 DIAGNOSIS — F902 Attention-deficit hyperactivity disorder, combined type: Secondary | ICD-10-CM

## 2021-01-16 MED ORDER — AMPHETAMINE-DEXTROAMPHETAMINE 30 MG PO TABS: 15.0000 mg | ORAL_TABLET | Freq: Four times a day (QID) | ORAL | 0 refills | Status: DC

## 2021-01-16 NOTE — Telephone Encounter (Signed)
Pt  needs a  refill on her adderall. She has to go out of town. [facility] [place]

## 2021-01-16 NOTE — Telephone Encounter (Signed)
Pended.

## 2021-01-17 ENCOUNTER — Encounter: Payer: Self-pay | Admitting: Gastroenterology

## 2021-02-02 ENCOUNTER — Other Ambulatory Visit: Payer: Self-pay | Admitting: Pharmacy Technician

## 2021-02-07 ENCOUNTER — Ambulatory Visit (AMBULATORY_SURGERY_CENTER): Payer: Self-pay

## 2021-02-07 ENCOUNTER — Other Ambulatory Visit: Payer: Self-pay

## 2021-02-07 VITALS — Ht 62.0 in | Wt 160.0 lb

## 2021-02-07 DIAGNOSIS — K209 Esophagitis, unspecified without bleeding: Secondary | ICD-10-CM

## 2021-02-07 DIAGNOSIS — K2210 Ulcer of esophagus without bleeding: Secondary | ICD-10-CM

## 2021-02-07 NOTE — Progress Notes (Signed)

## 2021-02-09 ENCOUNTER — Other Ambulatory Visit: Payer: Self-pay

## 2021-02-10 ENCOUNTER — Telehealth (INDEPENDENT_AMBULATORY_CARE_PROVIDER_SITE_OTHER): Payer: Self-pay | Admitting: Family Medicine

## 2021-02-10 DIAGNOSIS — G894 Chronic pain syndrome: Secondary | ICD-10-CM

## 2021-02-10 MED ORDER — OXYCODONE HCL 15 MG PO TABS: 15.0000 mg | ORAL_TABLET | Freq: Three times a day (TID) | ORAL | 0 refills | Status: DC | PRN

## 2021-02-10 NOTE — Progress Notes (Signed)
Patient ID: Toni Collins, female   DOB: 11/01/1965, 55 y.o.   MRN: [id number]   This visit type was conducted due to national recommendations for restrictions regarding the COVID-19 pandemic in an effort to limit this patient's exposure and mitigate transmission in our community.   Virtual Visit via Video Note  I connected with Toni Collins on 02/10/21 at  1:15 PM EDT by a video enabled telemedicine application and verified that I am speaking with the correct person using two identifiers.  Location patient: home Location provider:work or home office Persons participating in the virtual visit: patient, provider  I discussed the limitations of evaluation and management by telemedicine and the availability of in person appointments. The patient expressed understanding and agreed to proceed.   HPI:  Toni Collins chronic pain syndrome.  She Collins fibromyalgia.  She is recently had some chronic low back pain and bilateral shoulder pain.  She Collins seen sports medicine and Collins had multiple steroid injections which provide temporary relief.  She had been on chronic pain medications for years.  She tried multiple over-the-counter medications without relief.  She is currently on regimen of oxycodone 10 mg every 6 hours but Collins poor pain control.  On 1 occasion she took 1-1/2 tablets and that seemed to provide better relief.  She is specifically requesting whether her dosage could be increased to 15 mg every 8 hours versus 10 mg every 6 hours.  We have discussed pain management multiple times in the past but because of cost and insurance issues she had declined.  Also takes Lyrica 150 mg 3 times daily.  She Collins chronic sleep disturbance.  Followed by psychiatry.  ROS: See pertinent positives and negatives per HPI.  Past Medical History:  Diagnosis Date   ADHD    Anemia    Anxiety    Colon polyps    DEPRESSION 09/02/2008   Fibromyalgia    GERD (gastroesophageal reflux disease)    GRIEF  REACTION 09/30/2009   INSOMNIA, CHRONIC 09/02/2008   PREDIABETES 03/10/2009    Past Surgical History:  Procedure Laterality Date   ABDOMINAL HYSTERECTOMY  05/28/2000   TAH   COLONOSCOPY     DIAGNOSTIC LAPAROSCOPY  1993, 1994, 1998, 2000   x4   TMJ ARTHROPLASTY     x2    Family History  Problem Relation Age of Onset   Arthritis Mother        rhematiod   Colon polyps Father    Lung disease Father    Colon polyps Sister    Diabetes Maternal Aunt    Diabetes Maternal Grandmother    Depression Neg Hx        family   Colon cancer Neg Hx    Esophageal cancer Neg Hx    Pancreatic cancer Neg Hx    Stomach cancer Neg Hx    Rectal cancer Neg Hx     SOCIAL HX: Non-smoker   Current Outpatient Medications:    amphetamine-dextroamphetamine (ADDERALL) 30 MG tablet, Take 0.5 tablets by mouth 4 (four) times daily., Disp: 60 tablet, Rfl: 0   cariprazine (VRAYLAR) 3 MG capsule, Take 1 capsule (3 mg total) by mouth daily., Disp: 30 capsule, Rfl: 2   esomeprazole (NEXIUM) 40 MG capsule, Take 1 capsule (40 mg total) by mouth 2 (two) times daily before a meal., Disp: 60 capsule, Rfl: 3   ondansetron (ZOFRAN-ODT) 4 MG disintegrating tablet, DISSOLVE/TAKE 1 TABLET BY MOUTH EVERY 8 HOURS AS NEEDED FOR NAUSEA AND VOMITING, Disp: 20  tablet, Rfl: 0   oxyCODONE (ROXICODONE) 15 MG immediate release tablet, Take 1 tablet (15 mg total) by mouth every 8 (eight) hours as needed for pain., Disp: 90 tablet, Rfl: 0   pregabalin (LYRICA) 150 MG capsule, Take 1 capsule (150 mg total) by mouth in the morning, at noon, and at bedtime., Disp: 270 capsule, Rfl: 1   sertraline (ZOLOFT) 100 MG tablet, Take 2 tablets (200 mg total) by mouth daily., Disp: 180 tablet, Rfl: 1   sucralfate (CARAFATE) 1 g tablet, Take 1 tablet (1 g total) by mouth 4 (four) times daily -  with meals and at bedtime. Take separate from other medications, Disp: 120 tablet, Rfl: 0  EXAM:  VITALS per patient if applicable:  GENERAL: alert,  oriented, appears well and in no acute distress  HEENT: atraumatic, conjunttiva clear, no obvious abnormalities on inspection of external nose and ears  NECK: normal movements of the head and neck  LUNGS: on inspection no signs of respiratory distress, breathing rate appears normal, no obvious gross SOB, gasping or wheezing  CV: no obvious cyanosis  MS: moves all visible extremities without noticeable abnormality  PSYCH/NEURO: pleasant and cooperative, no obvious depression or anxiety, speech and thought processing grossly intact  ASSESSMENT AND PLAN:  Discussed the following assessment and plan:  Chronic pain syndrome-pain poorly controlled currently on oxycodone 10 mg 4 times daily  -We will increase her oxycodone immediate release to 15 mg every 8 hours -Recommend follow-up in 1 month to give [place] some feedback.     I discussed the assessment and treatment plan with the patient. The patient was provided an opportunity to ask questions and all were answered. The patient agreed with the plan and demonstrated an understanding of the instructions.   The patient was advised to call back or seek an in-person evaluation if the symptoms worsen or if the condition fails to improve as anticipated.     Carolann Littler, MD

## 2021-02-14 ENCOUNTER — Other Ambulatory Visit: Payer: Self-pay

## 2021-02-14 DIAGNOSIS — F902 Attention-deficit hyperactivity disorder, combined type: Secondary | ICD-10-CM

## 2021-02-14 MED ORDER — AMPHETAMINE-DEXTROAMPHETAMINE 30 MG PO TABS: 15.0000 mg | ORAL_TABLET | Freq: Four times a day (QID) | ORAL | 0 refills | Status: DC

## 2021-02-14 NOTE — Telephone Encounter (Signed)
Pt called requesting refill of Adderal to [facility]  [id number] - [place], [place] - [address], [place] [address]  Phone:  [phone]  Fax:  [phone]  Next appt 12/19

## 2021-02-15 ENCOUNTER — Ambulatory Visit: Payer: Self-pay | Admitting: Family Medicine

## 2021-02-21 ENCOUNTER — Encounter: Payer: Self-pay | Admitting: Gastroenterology

## 2021-02-21 ENCOUNTER — Ambulatory Visit (AMBULATORY_SURGERY_CENTER): Payer: Self-pay | Admitting: Gastroenterology

## 2021-02-21 ENCOUNTER — Other Ambulatory Visit: Payer: Self-pay

## 2021-02-21 ENCOUNTER — Telehealth: Payer: Self-pay | Admitting: *Deleted

## 2021-02-21 VITALS — BP 100/67 | HR 73 | Temp 98.6°F | Resp 19 | Ht 62.0 in | Wt 160.0 lb

## 2021-02-21 DIAGNOSIS — K317 Polyp of stomach and duodenum: Secondary | ICD-10-CM

## 2021-02-21 DIAGNOSIS — K449 Diaphragmatic hernia without obstruction or gangrene: Secondary | ICD-10-CM

## 2021-02-21 DIAGNOSIS — K2100 Gastro-esophageal reflux disease with esophagitis, without bleeding: Secondary | ICD-10-CM

## 2021-02-21 DIAGNOSIS — K209 Esophagitis, unspecified without bleeding: Secondary | ICD-10-CM

## 2021-02-21 DIAGNOSIS — K2210 Ulcer of esophagus without bleeding: Secondary | ICD-10-CM

## 2021-02-21 MED ORDER — RABEPRAZOLE SODIUM 20 MG PO TBEC: 20.0000 mg | DELAYED_RELEASE_TABLET | Freq: Two times a day (BID) | ORAL | 3 refills | Status: DC

## 2021-02-21 MED ORDER — DEXLANSOPRAZOLE 60 MG PO CPDR: 60.0000 mg | DELAYED_RELEASE_CAPSULE | Freq: Every day | ORAL | 3 refills | Status: DC

## 2021-02-21 MED ORDER — SODIUM CHLORIDE 0.9 % IV SOLN
500.0000 mL | Freq: Once | INTRAVENOUS | Status: DC
Start: 2021-02-21 — End: 2022-02-05

## 2021-02-21 NOTE — Op Note (Signed)
Toni Collins: Toni Collins Procedure Date: 02/21/2021 9:44 AM MRN: [id number] Endoscopist: Nicki Reaper E. Candis Schatz , MD Age: 55 Referring MD:  Date of Birth: [birth year] Gender: Female Account #: [id number] Procedure:                Upper GI endoscopy Indications:              Follow-up of reflux esophagitis. Symptoms improved,                            but not resolved with Nexium 40 mg BID Medicines:                Monitored Anesthesia Care Procedure:                Pre-Anesthesia Assessment:                           - Prior to the procedure, a History and Physical                            was performed, and patient medications and                            allergies were reviewed. The patient's tolerance of                            previous anesthesia was also reviewed. The risks                            and benefits of the procedure and the sedation                            options and risks were discussed with the patient.                            All questions were answered, and informed consent                            was obtained. Prior Anticoagulants: The patient has                            taken no previous anticoagulant or antiplatelet                            agents. ASA Grade Assessment: II - A patient with                            mild systemic disease. After reviewing the risks                            and benefits, the patient was deemed in                            satisfactory condition to undergo the procedure.  After obtaining informed consent, the endoscope was                            passed under direct vision. Throughout the                            procedure, the patient's blood pressure, pulse, and                            oxygen saturations were monitored continuously. The                            GIF [id number] #[id number] was introduced through the                            mouth,  and advanced to the third part of duodenum.                            The upper GI endoscopy was accomplished without                            difficulty. The patient tolerated the procedure                            well. Scope In: Scope Out: Findings:                 The examined portions of the nasopharynx,                            oropharynx and larynx were normal.                           LA Grade C (one or more mucosal breaks continuous                            between tops of 2 or more mucosal folds, less than                            75% circumference) esophagitis with no bleeding was                            found. Biopsies were taken with a cold forceps for                            histology. Estimated blood loss was minimal.                           The exam of the esophagus was otherwise normal.                           A 4 cm hiatal hernia was present.  A few 3 to 8 mm sessile polyps with no bleeding and                            no stigmata of recent bleeding were found in the                            gastric body. These polyps were removed with a cold                            snare. Resection and retrieval were complete.                            Estimated blood loss was minimal.                           A single 4 mm sessile polyp with no bleeding and no                            stigmata of recent bleeding was found in the                            gastric antrum. The polyp was removed with a cold                            snare. Resection and retrieval were complete.                            Estimated blood loss was minimal.                           The exam of the stomach was otherwise normal.                           The examined duodenum was normal. Complications:            No immediate complications. Estimated Blood Loss:     Estimated blood loss was minimal. Impression:               - The examined portions  of the nasopharynx,                            oropharynx and larynx were normal.                           - LA Grade C reflux esophagitis with no bleeding.                            Biopsied.                           - 4 cm hiatal hernia.                           - A few gastric polyps. Resected and retrieved.                           -  A single gastric polyp. Resected and retrieved.                           - Normal examined duodenum. Recommendation:           - Patient has a contact number available for                            emergencies. The signs and symptoms of potential                            delayed complications were discussed with the                            patient. Return to normal activities tomorrow.                            Written discharge instructions were provided to the                            patient.                           - Resume previous diet.                           - Continue present medications.                           - Await pathology results.                           - Repeat upper endoscopy in 8 weeks to check                            healing.                           - Use Dexilant (dexlansoprazole) 60 mg PO daily for                            8 weeks. Use this instead of Nexium.                           - Consider surgery referral for reduction of hiatal                            hernia and fundoplication. Jameis Newsham E. Candis Schatz, MD 02/21/2021 10:28:04 AM This report has been signed electronically.

## 2021-02-21 NOTE — Progress Notes (Signed)
Report given to PACU, vss 

## 2021-02-21 NOTE — Progress Notes (Signed)
0958 Robinul 0.1 mg IV given due large amount of secretions upon assessment.  MD made aware, vss

## 2021-02-21 NOTE — Telephone Encounter (Signed)
Attempted to call patient and schedule repeat EGD. No answer message left. Awaiting return call.

## 2021-02-21 NOTE — Progress Notes (Signed)
Called to room to assist during endoscopic procedure.  Patient ID and intended procedure confirmed with present staff. Received instructions for my participation in the procedure from the performing physician.  

## 2021-02-21 NOTE — Progress Notes (Signed)
Pt's states no medical or surgical changes since previsit or office visit. Vitals taken by DT

## 2021-02-21 NOTE — Progress Notes (Signed)
[facility] History and Physical   Primary Care Physician:  Eulas Post, MD   Reason for Procedure:   Follow up esophagitis  Plan:    EGD     HPI: Toni Collins is a 55 y.o. female found to have LA Grade D esophagitis on EGD in July.  She is here today to assess healing of her esophagitis.  She was started on BID Nexium following the EGD> Her GERD symptoms are much improved since then.  She still has occasional symptoms, but they are mild and infrequent.   Past Medical History:  Diagnosis Date   ADHD    Anemia    Anxiety    Colon polyps    DEPRESSION 09/02/2008   Fibromyalgia    GERD (gastroesophageal reflux disease)    GRIEF REACTION 09/30/2009   INSOMNIA, CHRONIC 09/02/2008   PREDIABETES 03/10/2009    Past Surgical History:  Procedure Laterality Date   ABDOMINAL HYSTERECTOMY  05/28/2000   TAH   COLONOSCOPY     DIAGNOSTIC LAPAROSCOPY  1993, 1994, 1998, 2000   x4   TMJ ARTHROPLASTY     x2    Prior to Admission medications   Medication Sig Start Date End Date Taking? Authorizing Provider  amphetamine-dextroamphetamine (ADDERALL) 30 MG tablet Take 0.5 tablets by mouth 4 (four) times daily. 02/14/21  Yes Cottle, Billey Co., MD  cariprazine (VRAYLAR) 3 MG capsule Take 1 capsule (3 mg total) by mouth daily. 11/17/20  Yes Cottle, Billey Co., MD  esomeprazole (NEXIUM) 40 MG capsule Take 1 capsule (40 mg total) by mouth 2 (two) times daily before a meal. 12/01/20  Yes Kennedy-Smith, Patrecia Pour, NP  oxyCODONE (ROXICODONE) 15 MG immediate release tablet Take 1 tablet (15 mg total) by mouth every 8 (eight) hours as needed for pain. 02/10/21  Yes Burchette, Alinda Sierras, MD  pregabalin (LYRICA) 150 MG capsule Take 1 capsule (150 mg total) by mouth in the morning, at noon, and at bedtime. 12/23/20  Yes Cottle, Billey Co., MD  sertraline (ZOLOFT) 100 MG tablet Take 2 tablets (200 mg total) by mouth daily. 11/17/20  Yes Cottle, Billey Co., MD  sucralfate (CARAFATE) 1  g tablet Take 1 tablet (1 g total) by mouth 4 (four) times daily -  with meals and at bedtime. Take separate from other medications 12/01/20  Yes Noralyn Pick, NP  ondansetron (ZOFRAN-ODT) 4 MG disintegrating tablet DISSOLVE/TAKE 1 TABLET BY MOUTH EVERY 8 HOURS AS NEEDED FOR NAUSEA AND VOMITING 12/20/20   Mansouraty, Telford Nab., MD    Current Outpatient Medications  Medication Sig Dispense Refill   amphetamine-dextroamphetamine (ADDERALL) 30 MG tablet Take 0.5 tablets by mouth 4 (four) times daily. 60 tablet 0   cariprazine (VRAYLAR) 3 MG capsule Take 1 capsule (3 mg total) by mouth daily. 30 capsule 2   esomeprazole (NEXIUM) 40 MG capsule Take 1 capsule (40 mg total) by mouth 2 (two) times daily before a meal. 60 capsule 3   oxyCODONE (ROXICODONE) 15 MG immediate release tablet Take 1 tablet (15 mg total) by mouth every 8 (eight) hours as needed for pain. 90 tablet 0   pregabalin (LYRICA) 150 MG capsule Take 1 capsule (150 mg total) by mouth in the morning, at noon, and at bedtime. 270 capsule 1   sertraline (ZOLOFT) 100 MG tablet Take 2 tablets (200 mg total) by mouth daily. 180 tablet 1   sucralfate (CARAFATE) 1 g tablet Take 1 tablet (1 g total) by mouth 4 (  four) times daily -  with meals and at bedtime. Take separate from other medications 120 tablet 0   ondansetron (ZOFRAN-ODT) 4 MG disintegrating tablet DISSOLVE/TAKE 1 TABLET BY MOUTH EVERY 8 HOURS AS NEEDED FOR NAUSEA AND VOMITING 20 tablet 0   Current Facility-Administered Medications  Medication Dose Route Frequency Provider Last Rate Last Admin   0.9 %  sodium chloride infusion  500 mL Intravenous Once Daryel November, MD        Allergies as of 02/21/2021   (No Known Allergies)    Family History  Problem Relation Age of Onset   Arthritis Mother        rhematiod   Colon polyps Father    Lung disease Father    Colon polyps Sister    Diabetes Maternal Aunt    Diabetes Maternal Grandmother    Depression Neg Hx         family   Colon cancer Neg Hx    Esophageal cancer Neg Hx    Pancreatic cancer Neg Hx    Stomach cancer Neg Hx    Rectal cancer Neg Hx     Social History   Socioeconomic History   Marital status: Married    Spouse name: Not on file   Number of children: Not on file   Years of education: Not on file   Highest education level: Not on file  Occupational History   Not on file  Tobacco Use   Smoking status: Never   Smokeless tobacco: Never  Vaping Use   Vaping Use: Never used  Substance and Sexual Activity   Alcohol use: No   Drug use: Not Currently   Sexual activity: Not on file  Other Topics Concern   Not on file  Social History Narrative   Not on file   Social Determinants of Health   Financial Resource Strain: Not on file  Food Insecurity: Not on file  Transportation Needs: Not on file  Physical Activity: Not on file  Stress: Not on file  Social Connections: Not on file  Intimate Partner Violence: Not on file    Review of Systems:  All other review of systems negative except as mentioned in the HPI.  Physical Exam: Vital signs BP 104/69   Pulse 82   Temp 98.6 F (37 C)   Ht 5\' 2"  (1.575 m)   Wt 160 lb (72.6 kg)   BMI 29.26 kg/m   General:   Alert,  Well-developed, well-nourished, pleasant and cooperative in NAD. MP1 Lungs:  Clear throughout to auscultation.   Heart:  Regular rate and rhythm; no murmurs, clicks, rubs,  or gallops. Abdomen:  Soft, nontender and nondistended. Normal bowel sounds.   Neuro/Psych:  Normal mood and affect. A and O x 3   Caleel Kiner E. Candis Schatz, MD [facility]

## 2021-02-21 NOTE — Patient Instructions (Signed)
Resume previous diet and continue present medications. Awaiting pathology results. Repeat upper endoscopy in 8 weeks to check healing. Use Dexilant 60 mg by mouth daily for 8 weeks. Use this instead of Nexium. Consider surgery referral for reduction of hiatal hernia and fundoplication.  YOU HAD AN ENDOSCOPIC PROCEDURE TODAY AT [facility]:   Refer to the procedure report that was given to you for any specific questions about what was found during the examination.  If the procedure report does not answer your questions, please call your gastroenterologist to clarify.  If you requested that your care partner not be given the details of your procedure findings, then the procedure report has been included in a sealed envelope for you to review at your convenience later.  YOU SHOULD EXPECT: Some feelings of bloating in the abdomen. Passage of more gas than usual.  Walking can help get rid of the air that was put into your GI tract during the procedure and reduce the bloating. If you had a lower endoscopy (such as a colonoscopy or flexible sigmoidoscopy) you may notice spotting of blood in your stool or on the toilet paper. If you underwent a bowel prep for your procedure, you may not have a normal bowel movement for a few days.  Please Note:  You might notice some irritation and congestion in your nose or some drainage.  This is from the oxygen used during your procedure.  There is no need for concern and it should clear up in a day or so.  SYMPTOMS TO REPORT IMMEDIATELY:  Following upper endoscopy (EGD)  Vomiting of blood or coffee ground material  New chest pain or pain under the shoulder blades  Painful or persistently difficult swallowing  New shortness of breath  Fever of 100F or higher  Black, tarry-looking stools  For urgent or emergent issues, a gastroenterologist can be reached at any hour by calling [phone]. Do not use MyChart messaging for urgent concerns.     DIET:  We do recommend a small meal at first, but then you may proceed to your regular diet.  Drink plenty of fluids but you should avoid alcoholic beverages for 24 hours.  ACTIVITY:  You should plan to take it easy for the rest of today and you should NOT DRIVE or use heavy machinery until tomorrow (because of the sedation medicines used during the test).    FOLLOW UP: Our staff will call the number listed on your records 48-72 hours following your procedure to check on you and address any questions or concerns that you may have regarding the information given to you following your procedure. If we do not reach you, we will leave a message.  We will attempt to reach you two times.  During this call, we will ask if you have developed any symptoms of COVID 19. If you develop any symptoms (ie: fever, flu-like symptoms, shortness of breath, cough etc.) before then, please call [phone].  If you test positive for Covid 19 in the 2 weeks post procedure, please call and report this information to [place].    If any biopsies were taken you will be contacted by phone or by letter within the next 1-3 weeks.  Please call us at [phone] if you have not heard about the biopsies in 3 weeks.    SIGNATURES/CONFIDENTIALITY: You and/or your care partner have signed paperwork which will be entered into your electronic medical record.  These signatures attest to the fact that that  the information above on your After Visit Summary has been reviewed and is understood.  Full responsibility of the confidentiality of this discharge information lies with you and/or your care-partner.

## 2021-02-23 ENCOUNTER — Telehealth: Payer: Self-pay | Admitting: *Deleted

## 2021-02-23 ENCOUNTER — Telehealth: Payer: Self-pay

## 2021-02-23 NOTE — Telephone Encounter (Signed)
Called [phone] and left a message we tried to reach pt for a follow up call. maw

## 2021-02-23 NOTE — Telephone Encounter (Signed)
Follow up call made. 

## 2021-03-03 ENCOUNTER — Encounter: Payer: Self-pay | Admitting: Gastroenterology

## 2021-03-10 ENCOUNTER — Telehealth (INDEPENDENT_AMBULATORY_CARE_PROVIDER_SITE_OTHER): Payer: Self-pay | Admitting: Family Medicine

## 2021-03-10 ENCOUNTER — Encounter: Payer: Self-pay | Admitting: Family Medicine

## 2021-03-10 ENCOUNTER — Other Ambulatory Visit: Payer: Self-pay

## 2021-03-10 DIAGNOSIS — G894 Chronic pain syndrome: Secondary | ICD-10-CM

## 2021-03-10 MED ORDER — OXYCODONE HCL 15 MG PO TABS: 15.0000 mg | ORAL_TABLET | Freq: Four times a day (QID) | ORAL | 0 refills | Status: DC | PRN

## 2021-03-10 NOTE — Progress Notes (Signed)
Patient ID: Toni Collins, female   DOB: 1965/06/10, 55 y.o.   MRN: [id number]  This visit type was conducted due to national recommendations for restrictions regarding the COVID-19 pandemic in an effort to limit this patient's exposure and mitigate transmission in our community.   Virtual Visit via Video Note  I connected with [name] on 03/10/21 at  1:45 PM EDT by a video enabled telemedicine application and verified that I am speaking with the correct person using two identifiers.  Location patient: home Location provider:work or home office Persons participating in the virtual visit: patient, provider  I discussed the limitations of evaluation and management by telemedicine and the availability of in person appointments. The patient expressed understanding and agreed to proceed.   HPI:  Chronic pain syndrome.  She does have fibromyalgia and also has chronic back pain.  She has been on many alternative medications in the past without relief.  She has been on oxycodone for several years.  We recently had increased from 10 to 15 mg and she states this has made a tremendous difference in her ability to be functional.  She does have some breakthrough pain and would like to consider going from 3 times daily to 4 times daily.  She denies any side effects such as constipation.  No fatigue issues.  In fact she feels less fatigued since becoming more active.  She is sleeping better.  Has occasionally been able to scale back her Lyrica.  She continues to have GERD issues and is looking at possible Nissen fundoplication.   ROS: See pertinent positives and negatives per HPI.  Past Medical History:  Diagnosis Date   ADHD    Anemia    Anxiety    Colon polyps    DEPRESSION 09/02/2008   Fibromyalgia    GERD (gastroesophageal reflux disease)    GRIEF REACTION 09/30/2009   INSOMNIA, CHRONIC 09/02/2008   PREDIABETES 03/10/2009    Past Surgical History:  Procedure Laterality Date    ABDOMINAL HYSTERECTOMY  05/28/2000   TAH   COLONOSCOPY     DIAGNOSTIC LAPAROSCOPY  1993, 1994, 1998, 2000   x4   TMJ ARTHROPLASTY     x2    Family History  Problem Relation Age of Onset   Arthritis Mother        rhematiod   Colon polyps Father    Lung disease Father    Colon polyps Sister    Diabetes Maternal Aunt    Diabetes Maternal Grandmother    Depression Neg Hx        family   Colon cancer Neg Hx    Esophageal cancer Neg Hx    Pancreatic cancer Neg Hx    Stomach cancer Neg Hx    Rectal cancer Neg Hx     SOCIAL HX: Non-smoker.  No alcohol use.   Current Outpatient Medications:    amphetamine-dextroamphetamine (ADDERALL) 30 MG tablet, Take 0.5 tablets by mouth 4 (four) times daily., Disp: 60 tablet, Rfl: 0   cariprazine (VRAYLAR) 3 MG capsule, Take 1 capsule (3 mg total) by mouth daily., Disp: 30 capsule, Rfl: 2   esomeprazole (NEXIUM) 40 MG capsule, Take 1 capsule (40 mg total) by mouth 2 (two) times daily before a meal., Disp: 60 capsule, Rfl: 3   ondansetron (ZOFRAN-ODT) 4 MG disintegrating tablet, DISSOLVE/TAKE 1 TABLET BY MOUTH EVERY 8 HOURS AS NEEDED FOR NAUSEA AND VOMITING, Disp: 20 tablet, Rfl: 0   oxyCODONE (ROXICODONE) 15 MG immediate release tablet, Take 1  tablet (15 mg total) by mouth every 6 (six) hours as needed for pain., Disp: 120 tablet, Rfl: 0   pregabalin (LYRICA) 150 MG capsule, Take 1 capsule (150 mg total) by mouth in the morning, at noon, and at bedtime., Disp: 270 capsule, Rfl: 1   RABEprazole (ACIPHEX) 20 MG tablet, Take 1 tablet (20 mg total) by mouth in the morning and at bedtime., Disp: 90 tablet, Rfl: 3   sertraline (ZOLOFT) 100 MG tablet, Take 2 tablets (200 mg total) by mouth daily., Disp: 180 tablet, Rfl: 1   sucralfate (CARAFATE) 1 g tablet, Take 1 tablet (1 g total) by mouth 4 (four) times daily -  with meals and at bedtime. Take separate from other medications, Disp: 120 tablet, Rfl: 0  Current Facility-Administered Medications:     0.9 %  sodium chloride infusion, 500 mL, Intravenous, Once, Candis Schatz, Gladstone Pih, MD  EXAM:  VITALS per patient if applicable:  GENERAL: alert, oriented, appears well and in no acute distress  HEENT: atraumatic, conjunttiva clear, no obvious abnormalities on inspection of external nose and ears  NECK: normal movements of the head and neck  LUNGS: on inspection no signs of respiratory distress, breathing rate appears normal, no obvious gross SOB, gasping or wheezing  CV: no obvious cyanosis  MS: moves all visible extremities without noticeable abnormality  PSYCH/NEURO: pleasant and cooperative, no obvious depression or anxiety, speech and thought processing grossly intact  ASSESSMENT AND PLAN:  Discussed the following assessment and plan:  Chronic pain syndrome.  Chronic back pain.  Fibromyalgia.  Patient's pain improved with oxycodone 15 mg.  Still having occasional breakthrough pain.  We agreed to increase this to every 6 hours with new prescription sent. We did reiterate that if not getting good control with this dosage we would recommend pain management referral     I discussed the assessment and treatment plan with the patient. The patient was provided an opportunity to ask questions and all were answered. The patient agreed with the plan and demonstrated an understanding of the instructions.   The patient was advised to call back or seek an in-person evaluation if the symptoms worsen or if the condition fails to improve as anticipated.     Carolann Littler, MD

## 2021-03-13 ENCOUNTER — Other Ambulatory Visit: Payer: Self-pay

## 2021-03-13 ENCOUNTER — Ambulatory Visit (INDEPENDENT_AMBULATORY_CARE_PROVIDER_SITE_OTHER): Payer: Self-pay | Admitting: Family Medicine

## 2021-03-13 ENCOUNTER — Ambulatory Visit: Payer: Self-pay

## 2021-03-13 VITALS — BP 118/80 | HR 97 | Ht 62.0 in | Wt 163.6 lb

## 2021-03-13 DIAGNOSIS — G8929 Other chronic pain: Secondary | ICD-10-CM

## 2021-03-13 DIAGNOSIS — M25512 Pain in left shoulder: Secondary | ICD-10-CM

## 2021-03-13 DIAGNOSIS — M25511 Pain in right shoulder: Secondary | ICD-10-CM

## 2021-03-13 NOTE — Patient Instructions (Signed)
Thank you for coming in today.   Call or go to the ER if you develop a large red swollen joint with extreme pain or oozing puss.    Continue home exercises.   Recheck as needed.   I can get xray whenever.  I can order PT whenever.   If you think you will have health insurance with your disability soon then ok to wait a little.

## 2021-03-13 NOTE — Progress Notes (Signed)
I, Peterson Lombard, LAT, ATC acting as a scribe for Lynne Leader, MD.  Toni Collins is a 55 y.o. female who presents to [facility] at [facility] today for cont chronic bilat shoulder pain. Of note, pt has fibromyalgia and takes oxycodone and Lyrica for chronic pain management. Pt was last seen by Dr. Georgina Snell on 11/01/20 and was given bilat [place] joint steroid injections and pt was encouraged to apply for the [facility]. Today, pt reports shoulder pain returned over the past 2-3 weeks, R>L. No numbness/tingling noted. Pt locates pain to deep within the [facility] joint w/ slight radiating pain over anterior deltoid.    Pertinent review of systems: No fevers or chills  Relevant historical information: History of stomach ulcer.  Preparing for what sounds like a Nissen fundoplication   Exam:  BP 118/80   Pulse 97   Ht 5\' 2"  (1.575 m)   Wt 163 lb 9.6 oz (74.2 kg)   SpO2 97%   BMI 29.92 kg/m  General: Well Developed, well nourished, and in no acute distress.   MSK: Shoulder bilaterally decreased range of motion pain with abduction.    Lab and Radiology Results  Procedure: Real-time Ultrasound Guided Injection of right shoulder glenohumeral joint posterior approach Device: Philips Affiniti 50G Images permanently stored and available for review in PACS Verbal informed consent obtained.  Discussed risks and benefits of procedure. Warned about infection bleeding damage to structures skin hypopigmentation and fat atrophy among others. Patient expresses understanding and agreement Time-out conducted.   Noted no overlying erythema, induration, or other signs of local infection.   Skin prepped in a sterile fashion.   Local anesthesia: Topical Ethyl chloride.   With sterile technique and under real time ultrasound guidance: 40 mg of Kenalog and 2 mL of lidocaine injected into glenohumeral joint. Fluid seen entering the joint capsule.   Completed without difficulty    Pain immediately resolved suggesting accurate placement of the medication.   Advised to call if fevers/chills, erythema, induration, drainage, or persistent bleeding.   Images permanently stored and available for review in the ultrasound unit.  Impression: Technically successful ultrasound guided injection.    Procedure: Real-time Ultrasound Guided Injection of left shoulder glenohumeral joint posterior approach Device: Philips Affiniti 50G Images permanently stored and available for review in PACS Verbal informed consent obtained.  Discussed risks and benefits of procedure. Warned about infection bleeding damage to structures skin hypopigmentation and fat atrophy among others. Patient expresses understanding and agreement Time-out conducted.   Noted no overlying erythema, induration, or other signs of local infection.   Skin prepped in a sterile fashion.   Local anesthesia: Topical Ethyl chloride.   With sterile technique and under real time ultrasound guidance: 40 mg of Kenalog and 2 L of Marcaine injected into glenohumeral joint. Fluid seen entering the joint capsule.   Completed without difficulty   Pain immediately resolved suggesting accurate placement of the medication.   Advised to call if fevers/chills, erythema, induration, drainage, or persistent bleeding.   Images permanently stored and available for review in the ultrasound unit.  Impression: Technically successful ultrasound guided injection.         Assessment and Plan: 55 y.o. female with bilateral shoulder pain.  Patient had great results previously with intra-articular injection.  Plan for repeat injection today.  Last injection was 4 months ago.  She does not currently have health insurance but will be getting disability and healthcare insurance soon.  At that point  we will proceed to physical therapy and x-rays.  Recheck back as needed.   PDMP not reviewed this encounter. Orders Placed This Encounter   Procedures   [place] LIMITED JOINT SPACE STRUCTURES UP BILAT(NO LINKED CHARGES)    Standing Status:   Future    Number of Occurrences:   1    Standing Expiration Date:   09/11/2021    Order Specific Question:   Reason for Exam (SYMPTOM  OR DIAGNOSIS REQUIRED)    Answer:   bilateral shoulder pain    Order Specific Question:   Preferred imaging location?    Answer:   [facility]   No orders of the defined types were placed in this encounter.    Discussed warning signs or symptoms. Please see discharge instructions. Patient expresses understanding.   The above documentation has been reviewed and is accurate and complete Lynne Leader, M.D.

## 2021-03-15 ENCOUNTER — Telehealth: Payer: Self-pay | Admitting: Psychiatry

## 2021-03-15 ENCOUNTER — Telehealth: Payer: Self-pay | Admitting: Gastroenterology

## 2021-03-15 NOTE — Telephone Encounter (Signed)
Please review

## 2021-03-15 NOTE — Telephone Encounter (Signed)
Inbound call from patient stating she was told to repeat EGD in 2 months but recall is for follow-up.  Please advise whether needs an office visit or ok to schedule procedure.

## 2021-03-15 NOTE — Telephone Encounter (Signed)
Patient called in for refill on Adderal 30mg . She states that the 30s are to expensive and would like to switch back to the 20mg s. Pls rtc to discuss [place] 12/2. [facility]acy [facility] [address]

## 2021-03-15 NOTE — Telephone Encounter (Signed)
The pt is due for a repeat EGD can you please call the pt and get him set up for previsit and EGD with Dr Candis Schatz?  Thanks

## 2021-03-16 ENCOUNTER — Other Ambulatory Visit: Payer: Self-pay | Admitting: Psychiatry

## 2021-03-16 DIAGNOSIS — F902 Attention-deficit hyperactivity disorder, combined type: Secondary | ICD-10-CM

## 2021-03-16 MED ORDER — AMPHETAMINE-DEXTROAMPHETAMINE 20 MG PO TABS: 20.0000 mg | ORAL_TABLET | Freq: Three times a day (TID) | ORAL | 0 refills | Status: DC

## 2021-03-16 NOTE — Telephone Encounter (Signed)
Pt called and said that the pharmacy has 202 mg in stock and will hold 90 for her until 4 pm

## 2021-03-16 NOTE — Telephone Encounter (Signed)
Left voicemail for patient to call back to schedule appts.

## 2021-03-16 NOTE — Telephone Encounter (Signed)
Sent prescription for Adderall 20 mg tablets 1 3 times daily

## 2021-03-16 NOTE — Telephone Encounter (Signed)
Toni Collins called to check status of refill of her Adderall.  If you have questions about the change in dose, let her know.  But the 30mg  are just so expensive.

## 2021-03-16 NOTE — Telephone Encounter (Signed)
The pharmacy will hold the 20 mg for her until 4pm if you will allow her to change the dose.

## 2021-03-16 NOTE — Telephone Encounter (Signed)
Is the dose change ok?

## 2021-03-21 ENCOUNTER — Ambulatory Visit (AMBULATORY_SURGERY_CENTER): Payer: Self-pay | Admitting: *Deleted

## 2021-03-21 ENCOUNTER — Other Ambulatory Visit: Payer: Self-pay

## 2021-03-21 VITALS — Ht 62.0 in | Wt 160.0 lb

## 2021-03-21 DIAGNOSIS — K209 Esophagitis, unspecified without bleeding: Secondary | ICD-10-CM

## 2021-03-21 NOTE — Progress Notes (Signed)
Pt's previsit is done over the phone and all paperwork (prep instructions, blank consent form to just read over) sent to patient and MyChart. Pt's name and DOB verified at the beginning of the previsit.  Pt denies any difficulty with ambulating.    No trouble with anesthesia, denies being told they were difficult to intubate, or hx/fam hx of malignant hyperthermia per pt   No egg or soy allergy  No home oxygen use   No medications for weight loss taken

## 2021-03-24 ENCOUNTER — Encounter: Payer: Self-pay | Admitting: Family Medicine

## 2021-03-27 MED ORDER — PREDNISONE 50 MG PO TABS: 50.0000 mg | ORAL_TABLET | Freq: Every day | ORAL | 0 refills | Status: DC

## 2021-03-31 ENCOUNTER — Other Ambulatory Visit: Payer: Self-pay

## 2021-04-02 MED ORDER — ONDANSETRON 4 MG PO TBDP: 4.0000 mg | ORAL_TABLET | Freq: Three times a day (TID) | ORAL | 0 refills | Status: DC | PRN

## 2021-04-07 ENCOUNTER — Ambulatory Visit: Payer: Self-pay | Admitting: Family Medicine

## 2021-04-10 ENCOUNTER — Encounter: Payer: Self-pay | Admitting: Family Medicine

## 2021-04-10 ENCOUNTER — Telehealth (INDEPENDENT_AMBULATORY_CARE_PROVIDER_SITE_OTHER): Payer: Self-pay | Admitting: Family Medicine

## 2021-04-10 ENCOUNTER — Other Ambulatory Visit: Payer: Self-pay

## 2021-04-10 DIAGNOSIS — G894 Chronic pain syndrome: Secondary | ICD-10-CM

## 2021-04-10 MED ORDER — OXYCODONE HCL 15 MG PO TABS: 15.0000 mg | ORAL_TABLET | Freq: Four times a day (QID) | ORAL | 0 refills | Status: DC

## 2021-04-10 NOTE — Progress Notes (Signed)
Patient ID: Toni Collins, female   DOB: Aug 13, 1965, 55 y.o.   MRN: [id number]  This visit type was conducted due to national recommendations for restrictions regarding the COVID-19 pandemic in an effort to limit this patient's exposure and mitigate transmission in our community.   Virtual Visit via Video Note  I connected with Toni Collins on 04/10/21 at  9:30 AM EST by a video enabled telemedicine application and verified that I am speaking with the correct person using two identifiers.  Location patient: home Location provider:work or home office Persons participating in the virtual visit: patient, provider  I discussed the limitations of evaluation and management by telemedicine and the availability of in person appointments. The patient expressed understanding and agreed to proceed.   HPI:  Toni Collins presents for medical follow-up.  She has chronic pain syndrome with chronic back pain and chronic myofascial pain.  She has been on multiple conservative therapies over the years without much relief.  She has been for years on opioids and we recently increased her medication slightly to 15 mg 4 times daily.  She states her quality of life is improved with this.  She is able to be more functional in terms of walking and activities.  Denies any side effects.  She has history of severe esophagitis.  Has scheduled follow-up EGD in December.  She states symptomatically she is improved and she remains on Nexium 40 mg twice daily.  ROS: See pertinent positives and negatives per HPI.  Past Medical History:  Diagnosis Date   ADHD    Anemia    Anxiety    Arthritis    Colon polyps    DEPRESSION 09/02/2008   Fibromyalgia    GERD (gastroesophageal reflux disease)    GRIEF REACTION 09/30/2009   INSOMNIA, CHRONIC 09/02/2008   PREDIABETES 03/10/2009   PTSD (post-traumatic stress disorder)     Past Surgical History:  Procedure Laterality Date   ABDOMINAL HYSTERECTOMY   05/28/2000   TAH   COLONOSCOPY     DIAGNOSTIC LAPAROSCOPY  1993, 1994, 1998, 2000   x4   TMJ ARTHROPLASTY     x2   UPPER GASTROINTESTINAL ENDOSCOPY      Family History  Problem Relation Age of Onset   Arthritis Mother        rhematiod   Colon polyps Father    Lung disease Father    Colon polyps Sister    Diabetes Maternal Aunt    Diabetes Maternal Grandmother    Depression Neg Hx        family   Colon cancer Neg Hx    Esophageal cancer Neg Hx    Pancreatic cancer Neg Hx    Stomach cancer Neg Hx    Rectal cancer Neg Hx     SOCIAL HX: Non-smoker   Current Outpatient Medications:    amphetamine-dextroamphetamine (ADDERALL) 20 MG tablet, Take 1 tablet (20 mg total) by mouth 3 (three) times daily., Disp: 90 tablet, Rfl: 0   amphetamine-dextroamphetamine (ADDERALL) 30 MG tablet, Take 0.5 tablets by mouth 4 (four) times daily., Disp: 60 tablet, Rfl: 0   cariprazine (VRAYLAR) 3 MG capsule, Take 1 capsule (3 mg total) by mouth daily., Disp: 30 capsule, Rfl: 2   esomeprazole (NEXIUM) 40 MG capsule, Take 1 capsule (40 mg total) by mouth 2 (two) times daily before a meal., Disp: 60 capsule, Rfl: 3   ondansetron (ZOFRAN-ODT) 4 MG disintegrating tablet, Take 1 tablet (4 mg total) by mouth every 8 (eight) hours  as needed for nausea or vomiting. Please keep your December appointment for further refills. Thank you, Disp: 20 tablet, Rfl: 0   oxyCODONE (ROXICODONE) 15 MG immediate release tablet, Take 1 tablet (15 mg total) by mouth every 6 (six) hours. May refill in two months, Disp: 120 tablet, Rfl: 0   oxyCODONE (ROXICODONE) 15 MG immediate release tablet, Take 1 tablet (15 mg total) by mouth every 6 (six) hours., Disp: 120 tablet, Rfl: 0   pregabalin (LYRICA) 150 MG capsule, Take 1 capsule (150 mg total) by mouth in the morning, at noon, and at bedtime., Disp: 270 capsule, Rfl: 1   RABEprazole (ACIPHEX) 20 MG tablet, Take 1 tablet (20 mg total) by mouth in the morning and at bedtime., Disp:  90 tablet, Rfl: 3   sertraline (ZOLOFT) 100 MG tablet, Take 2 tablets (200 mg total) by mouth daily., Disp: 180 tablet, Rfl: 1   sucralfate (CARAFATE) 1 g tablet, Take 1 tablet (1 g total) by mouth 4 (four) times daily -  with meals and at bedtime. Take separate from other medications, Disp: 120 tablet, Rfl: 0   oxyCODONE (ROXICODONE) 15 MG immediate release tablet, Take 1 tablet (15 mg total) by mouth every 6 (six) hours., Disp: 120 tablet, Rfl: 0  Current Facility-Administered Medications:    0.9 %  sodium chloride infusion, 500 mL, Intravenous, Once, Candis Schatz, Gladstone Pih, MD  EXAM:  VITALS per patient if applicable:  GENERAL: alert, oriented, appears well and in no acute distress  HEENT: atraumatic, conjunttiva clear, no obvious abnormalities on inspection of external nose and ears  NECK: normal movements of the head and neck  LUNGS: on inspection no signs of respiratory distress, breathing rate appears normal, no obvious gross SOB, gasping or wheezing  CV: no obvious cyanosis  MS: moves all visible extremities without noticeable abnormality  PSYCH/NEURO: pleasant and cooperative, no obvious depression or anxiety, speech and thought processing grossly intact  ASSESSMENT AND PLAN:  Discussed the following assessment and plan:  Chronic pain syndrome-overall improved with recent titration oxycodone.  Refills provided for 3 months.  She does need drug screen with next office visit and we will plan on trying to get this within the next few months.     I discussed the assessment and treatment plan with the patient. The patient was provided an opportunity to ask questions and all were answered. The patient agreed with the plan and demonstrated an understanding of the instructions.   The patient was advised to call back or seek an in-person evaluation if the symptoms worsen or if the condition fails to improve as anticipated.     Carolann Littler, MD

## 2021-04-10 NOTE — Progress Notes (Deleted)
Acute Office Visit  Subjective:    Patient ID: Toni Collins, female    DOB: Jun 30, 1965, 55 y.o.   MRN: [id number]  No chief complaint on file.   HPI Patient is in today for ***  Past Medical History:  Diagnosis Date   ADHD    Anemia    Anxiety    Arthritis    Colon polyps    DEPRESSION 09/02/2008   Fibromyalgia    GERD (gastroesophageal reflux disease)    GRIEF REACTION 09/30/2009   INSOMNIA, CHRONIC 09/02/2008   PREDIABETES 03/10/2009   PTSD (post-traumatic stress disorder)     Past Surgical History:  Procedure Laterality Date   ABDOMINAL HYSTERECTOMY  05/28/2000   TAH   COLONOSCOPY     DIAGNOSTIC LAPAROSCOPY  1993, 1994, 1998, 2000   x4   TMJ ARTHROPLASTY     x2   UPPER GASTROINTESTINAL ENDOSCOPY      Family History  Problem Relation Age of Onset   Arthritis Mother        rhematiod   Colon polyps Father    Lung disease Father    Colon polyps Sister    Diabetes Maternal Aunt    Diabetes Maternal Grandmother    Depression Neg Hx        family   Colon cancer Neg Hx    Esophageal cancer Neg Hx    Pancreatic cancer Neg Hx    Stomach cancer Neg Hx    Rectal cancer Neg Hx     Social History   Socioeconomic History   Marital status: Married    Spouse name: Not on file   Number of children: Not on file   Years of education: Not on file   Highest education level: Not on file  Occupational History   Not on file  Tobacco Use   Smoking status: Never   Smokeless tobacco: Never  Vaping Use   Vaping Use: Never used  Substance and Sexual Activity   Alcohol use: No   Drug use: Not Currently   Sexual activity: Not on file  Other Topics Concern   Not on file  Social History Narrative   Not on file   Social Determinants of Health   Financial Resource Strain: Not on file  Food Insecurity: Not on file  Transportation Needs: Not on file  Physical Activity: Not on file  Stress: Not on file  Social Connections: Not on file  Intimate Partner  Violence: Not on file    Outpatient Medications Prior to Visit  Medication Sig Dispense Refill   amphetamine-dextroamphetamine (ADDERALL) 20 MG tablet Take 1 tablet (20 mg total) by mouth 3 (three) times daily. 90 tablet 0   amphetamine-dextroamphetamine (ADDERALL) 30 MG tablet Take 0.5 tablets by mouth 4 (four) times daily. 60 tablet 0   cariprazine (VRAYLAR) 3 MG capsule Take 1 capsule (3 mg total) by mouth daily. 30 capsule 2   esomeprazole (NEXIUM) 40 MG capsule Take 1 capsule (40 mg total) by mouth 2 (two) times daily before a meal. 60 capsule 3   ondansetron (ZOFRAN-ODT) 4 MG disintegrating tablet Take 1 tablet (4 mg total) by mouth every 8 (eight) hours as needed for nausea or vomiting. Please keep your December appointment for further refills. Thank you 20 tablet 0   pregabalin (LYRICA) 150 MG capsule Take 1 capsule (150 mg total) by mouth in the morning, at noon, and at bedtime. 270 capsule 1   RABEprazole (ACIPHEX) 20 MG tablet Take 1 tablet (20  mg total) by mouth in the morning and at bedtime. 90 tablet 3   sertraline (ZOLOFT) 100 MG tablet Take 2 tablets (200 mg total) by mouth daily. 180 tablet 1   sucralfate (CARAFATE) 1 g tablet Take 1 tablet (1 g total) by mouth 4 (four) times daily -  with meals and at bedtime. Take separate from other medications 120 tablet 0   oxyCODONE (ROXICODONE) 15 MG immediate release tablet Take 1 tablet (15 mg total) by mouth every 6 (six) hours as needed for pain. (Patient taking differently: Take 15 mg by mouth every 6 (six) hours as needed for pain. Takes 4 15 mg tablets daily) 120 tablet 0   predniSONE (DELTASONE) 50 MG tablet Take 1 tablet (50 mg total) by mouth daily. 5 tablet 0   Facility-Administered Medications Prior to Visit  Medication Dose Route Frequency Provider Last Rate Last Admin   0.9 %  sodium chloride infusion  500 mL Intravenous Once Daryel November, MD        No Known Allergies  Review of Systems     Objective:     Physical Exam  There were no vitals taken for this visit. Wt Readings from Last 3 Encounters:  03/21/21 160 lb (72.6 kg)  03/13/21 163 lb 9.6 oz (74.2 kg)  02/21/21 160 lb (72.6 kg)    Health Maintenance Due  Topic Date Due   HIV Screening  Never done   Hepatitis C Screening  Never done   PAP SMEAR-Modifier  Never done   MAMMOGRAM  04/20/2018   Zoster Vaccines- Shingrix (1 of 2) Never done    There are no preventive care reminders to display for this patient.   Lab Results  Component Value Date   TSH 3.45 06/03/2015   Lab Results  Component Value Date   WBC 5.9 01/02/2021   HGB 10.1 (L) 01/02/2021   HCT 31.8 (L) 01/02/2021   MCV 70.0 (L) 01/02/2021   PLT 289.0 01/02/2021   Lab Results  Component Value Date   NA 138 11/30/2020   K 4.3 11/30/2020   CO2 25 11/30/2020   GLUCOSE 99 11/30/2020   BUN 16 11/30/2020   CREATININE 0.82 11/30/2020   BILITOT 0.2 (L) 11/30/2020   ALKPHOS 72 11/30/2020   AST 17 11/30/2020   ALT 17 11/30/2020   PROT 6.8 11/30/2020   ALBUMIN 3.8 11/30/2020   CALCIUM 9.2 11/30/2020   ANIONGAP 7 11/30/2020   GFR 87.22 11/24/2020   Lab Results  Component Value Date   CHOL 204 (H) 06/03/2015   Lab Results  Component Value Date   HDL 63.10 06/03/2015   Lab Results  Component Value Date   LDLCALC 127 (H) 06/03/2015   Lab Results  Component Value Date   TRIG 66.0 06/03/2015   Lab Results  Component Value Date   CHOLHDL 3 06/03/2015   No results found for: HGBA1C     Assessment & Plan:   Problem List Items Addressed This Visit       Unprioritized   CHRONIC PAIN SYNDROME - Primary   Relevant Medications   oxyCODONE (ROXICODONE) 15 MG immediate release tablet   oxyCODONE (ROXICODONE) 15 MG immediate release tablet   oxyCODONE (ROXICODONE) 15 MG immediate release tablet     Meds ordered this encounter  Medications   oxyCODONE (ROXICODONE) 15 MG immediate release tablet    Sig: Take 1 tablet (15 mg total) by mouth every  6 (six) hours.    Dispense:  120 tablet  Refill:  0   oxyCODONE (ROXICODONE) 15 MG immediate release tablet    Sig: Take 1 tablet (15 mg total) by mouth every 6 (six) hours. May refill in two months    Dispense:  120 tablet    Refill:  0   oxyCODONE (ROXICODONE) 15 MG immediate release tablet    Sig: Take 1 tablet (15 mg total) by mouth every 6 (six) hours.    Dispense:  120 tablet    Refill:  0    May refill in one month     Carolann Littler, MD

## 2021-04-13 ENCOUNTER — Telehealth: Payer: Self-pay | Admitting: Psychiatry

## 2021-04-13 ENCOUNTER — Other Ambulatory Visit: Payer: Self-pay

## 2021-04-13 MED ORDER — AMPHETAMINE-DEXTROAMPHETAMINE 20 MG PO TABS: 20.0000 mg | ORAL_TABLET | Freq: Three times a day (TID) | ORAL | 0 refills | Status: DC

## 2021-04-13 NOTE — Telephone Encounter (Signed)
Patient called regarding refill for Adderall  20mg . States prescription isn't due until Saturday but she is going to [place] tomorrow for Thanksgiving and would like it filled before she leaves. Ph: [phone]. Appt 12/19. [facility] [place] [place]

## 2021-04-13 NOTE — Telephone Encounter (Signed)
Addendum to previous message. [facility] called back and said that this pharmacy has 100 tablets left. [facility]

## 2021-04-13 NOTE — Telephone Encounter (Signed)
Ok to fill early ?

## 2021-04-13 NOTE — Telephone Encounter (Signed)
Approved by dr Clovis Pu

## 2021-04-27 ENCOUNTER — Encounter: Payer: Self-pay | Admitting: Gastroenterology

## 2021-04-27 ENCOUNTER — Ambulatory Visit (AMBULATORY_SURGERY_CENTER): Payer: Self-pay | Admitting: Gastroenterology

## 2021-04-27 VITALS — BP 113/76 | HR 85 | Temp 97.8°F | Resp 15 | Ht 62.0 in | Wt 160.0 lb

## 2021-04-27 DIAGNOSIS — K2100 Gastro-esophageal reflux disease with esophagitis, without bleeding: Secondary | ICD-10-CM

## 2021-04-27 DIAGNOSIS — K2210 Ulcer of esophagus without bleeding: Secondary | ICD-10-CM

## 2021-04-27 DIAGNOSIS — K209 Esophagitis, unspecified without bleeding: Secondary | ICD-10-CM

## 2021-04-27 MED ORDER — SODIUM CHLORIDE 0.9 % IV SOLN: 500.0000 mL | Freq: Once | INTRAVENOUS | Status: DC

## 2021-04-27 MED ORDER — RABEPRAZOLE SODIUM 20 MG PO TBEC
40.0000 mg | DELAYED_RELEASE_TABLET | Freq: Two times a day (BID) | ORAL | 1 refills | Status: DC
Start: 2021-04-27 — End: 2021-05-08

## 2021-04-27 NOTE — Progress Notes (Signed)
Report given to PACU, vss 

## 2021-04-27 NOTE — Patient Instructions (Signed)
YOU HAD AN ENDOSCOPIC PROCEDURE TODAY AT THE [facility]:   Refer to the procedure report that was given to you for any specific questions about what was found during the examination.  If the procedure report does not answer your questions, please call your gastroenterologist to clarify.  If you requested that your care partner not be given the details of your procedure findings, then the procedure report has been included in a sealed envelope for you to review at your convenience later.  YOU SHOULD EXPECT: Some feelings of bloating in the abdomen. Passage of more gas than usual.  Walking can help get rid of the air that was put into your GI tract during the procedure and reduce the bloating. If you had a lower endoscopy (such as a colonoscopy or flexible sigmoidoscopy) you may notice spotting of blood in your stool or on the toilet paper. If you underwent a bowel prep for your procedure, you may not have a normal bowel movement for a few days.  Please Note:  You might notice some irritation and congestion in your nose or some drainage.  This is from the oxygen used during your procedure.  There is no need for concern and it should clear up in a day or so.  SYMPTOMS TO REPORT IMMEDIATELY:    Following upper endoscopy (EGD)  Vomiting of blood or coffee ground material  New chest pain or pain under the shoulder blades  Painful or persistently difficult swallowing  New shortness of breath  Fever of 100F or higher  Black, tarry-looking stools  For urgent or emergent issues, a gastroenterologist can be reached at any hour by calling [phone]. Do not use MyChart messaging for urgent concerns.    DIET:  We do recommend a small meal at first, but then you may proceed to your regular diet.  Drink plenty of fluids but you should avoid alcoholic beverages for 24 hours.  ACTIVITY:  You should plan to take it easy for the rest of today and you should NOT DRIVE or use heavy machinery  until tomorrow (because of the sedation medicines used during the test).    FOLLOW UP: Our staff will call the number listed on your records 48-72 hours following your procedure to check on you and address any questions or concerns that you may have regarding the information given to you following your procedure. If we do not reach you, we will leave a message.  We will attempt to reach you two times.  During this call, we will ask if you have developed any symptoms of COVID 19. If you develop any symptoms (ie: fever, flu-like symptoms, shortness of breath, cough etc.) before then, please call [phone].  If you test positive for Covid 19 in the 2 weeks post procedure, please call and report this information to us.    If any biopsies were taken you will be contacted by phone or by letter within the next 1-3 weeks.  Please call us at [phone] if you have not heard about the biopsies in 3 weeks.    SIGNATURES/CONFIDENTIALITY: You and/or your care partner have signed paperwork which will be entered into your electronic medical record.  These signatures attest to the fact that that the information above on your After Visit Summary has been reviewed and is understood.  Full responsibility of the confidentiality of this discharge information lies with you and/or your care-partner. 

## 2021-04-27 NOTE — Op Note (Signed)
[place] Patient Name: Toni Collins Procedure Date: 04/27/2021 10:29 AM MRN: [id number] Endoscopist: [place]. Candis Schatz , MD Age: 55 Referring MD:  Date of Birth: [birth year] Gender: Female Account #: [id number] Procedure:                Upper GI endoscopy Indications:              Follow-up of reflux esophagitis. Patient had LA                            Grade C esophagitis despite taking Nexium 40 mg PO                            BID. Was switched to Rabeprazole 20 mg PO BID 2                            months ago, which had been controlling her symptoms                            well until a few days ago when she had severe                            reflux symptoms. No symptoms for the past 2-3 days. Medicines:                Monitored Anesthesia Care Procedure:                Pre-Anesthesia Assessment:                           - Prior to the procedure, a History and Physical                            was performed, and patient medications and                            allergies were reviewed. The patient's tolerance of                            previous anesthesia was also reviewed. The risks                            and benefits of the procedure and the sedation                            options and risks were discussed with the patient.                            All questions were answered, and informed consent                            was obtained. Prior Anticoagulants: The patient has                            taken no previous anticoagulant or antiplatelet  agents. ASA Grade Assessment: II - A patient with                            mild systemic disease. After reviewing the risks                            and benefits, the patient was deemed in                            satisfactory condition to undergo the procedure.                           After obtaining informed consent, the endoscope was                             passed under direct vision. Throughout the                            procedure, the patient's blood pressure, pulse, and                            oxygen saturations were monitored continuously. The                            GIF [id number] #[id number] was introduced through the                            mouth, and advanced to the third part of duodenum.                            The upper GI endoscopy was accomplished without                            difficulty. The patient tolerated the procedure                            well. Scope In: Scope Out: Findings:                 The examined portions of the nasopharynx,                            oropharynx and larynx were normal.                           LA Grade D (one or more mucosal breaks involving at                            least 75% of esophageal circumference) esophagitis                            with no bleeding was found. Biopsies were taken                            with  a cold forceps for histology. Estimated blood                            loss was minimal.                           The exam of the esophagus was otherwise normal.                           A 4 cm hiatal hernia was present.                           A small amount of food (residue) was found in the                            gastric body.                           The exam of the stomach was otherwise normal.                           The examined duodenum was normal. Complications:            No immediate complications. Estimated Blood Loss:     Estimated blood loss was minimal. Impression:               - The examined portions of the nasopharynx,                            oropharynx and larynx were normal.                           - LA Grade D reflux esophagitis with no bleeding.                            Biopsied.                           - 4 cm hiatal hernia.                           - A small amount of food (residue) in the stomach.                            - Normal examined duodenum. Recommendation:           - Patient has a contact number available for                            emergencies. The signs and symptoms of potential                            delayed complications were discussed with the                            patient. Return to normal activities tomorrow.  Written discharge instructions were provided to the                            patient.                           - Resume previous diet.                           - Continue present medications.                           - Await pathology results.                           - Will further discuss medication options and                            ensure medication adherence with patient.                           - Recommend patient consider fundoplication/hiatal                            hernia repair given refractory esophagitis. Sheron Robin E. Candis Schatz, MD 04/27/2021 10:56:48 AM This report has been signed electronically.

## 2021-04-27 NOTE — Progress Notes (Signed)
VS taken by C.W. 

## 2021-04-27 NOTE — Progress Notes (Signed)
Called to room to assist during endoscopic procedure.  Patient ID and intended procedure confirmed with present staff. Received instructions for my participation in the procedure from the performing physician.  

## 2021-04-27 NOTE — Progress Notes (Signed)
1030 Robinul 0.1 mg IV given due large amount of secretions upon assessment.  MD made aware, vss 

## 2021-04-27 NOTE — Progress Notes (Signed)
Pt's states no medical or surgical changes since previsit or office visit. 

## 2021-04-27 NOTE — Progress Notes (Signed)
[facility] History and Physical   Primary Care Physician:  Eulas Post, MD   Reason for Procedure:   Follow up reflux esophagitis  Plan:    EGD     HPI: Toni Collins is a 55 y.o. female undergoing repeat EGD to assess healing of reflux esophagitis.  She had LA Grade C esophagitis noted on her last EGD Sept 27th, despite taking Nexium twice daily.  She was switched to rabeprazole at that time.  Addendum:  It was unclear when talking with the patient what medications she was actually taking.  Following the procedure, when her husband was present, it seemed that she had not started taking the rabeprazole until about 4 days ago.  She had apparently been taking pantoprazole which is what she said the pharmacy gave her (there is no active prescription for pantoprazole for this patient).  Past Medical History:  Diagnosis Date   ADHD    Anemia    Anxiety    Arthritis    Colon polyps    DEPRESSION 09/02/2008   Fibromyalgia    GERD (gastroesophageal reflux disease)    GRIEF REACTION 09/30/2009   INSOMNIA, CHRONIC 09/02/2008   PREDIABETES 03/10/2009   PTSD (post-traumatic stress disorder)     Past Surgical History:  Procedure Laterality Date   ABDOMINAL HYSTERECTOMY  05/28/2000   TAH   COLONOSCOPY     DIAGNOSTIC LAPAROSCOPY  1993, 1994, 1998, 2000   x4   TMJ ARTHROPLASTY     x2   UPPER GASTROINTESTINAL ENDOSCOPY      Prior to Admission medications   Medication Sig Start Date End Date Taking? Authorizing Provider  amphetamine-dextroamphetamine (ADDERALL) 20 MG tablet Take 1 tablet (20 mg total) by mouth 3 (three) times daily. 04/13/21  Yes Cottle, Billey Co., MD  cariprazine (VRAYLAR) 3 MG capsule Take 1 capsule (3 mg total) by mouth daily. 11/17/20  Yes Cottle, Billey Co., MD  esomeprazole (NEXIUM) 40 MG capsule Take 1 capsule (40 mg total) by mouth 2 (two) times daily before a meal. 12/01/20  Yes Kennedy-Smith, Patrecia Pour, NP  ondansetron (ZOFRAN-ODT)  4 MG disintegrating tablet Take 1 tablet (4 mg total) by mouth every 8 (eight) hours as needed for nausea or vomiting. Please keep your December appointment for further refills. Thank you 04/02/21  Yes Daryel November, MD  oxyCODONE (ROXICODONE) 15 MG immediate release tablet Take 1 tablet (15 mg total) by mouth every 6 (six) hours. 04/10/21  Yes Burchette, Alinda Sierras, MD  pregabalin (LYRICA) 150 MG capsule Take 1 capsule (150 mg total) by mouth in the morning, at noon, and at bedtime. 12/23/20  Yes Cottle, Billey Co., MD  RABEprazole (ACIPHEX) 20 MG tablet Take 1 tablet (20 mg total) by mouth in the morning and at bedtime. 02/21/21 04/27/21 Yes Daryel November, MD  sertraline (ZOLOFT) 100 MG tablet Take 2 tablets (200 mg total) by mouth daily. 11/17/20  Yes Cottle, Billey Co., MD  oxyCODONE (ROXICODONE) 15 MG immediate release tablet Take 1 tablet (15 mg total) by mouth every 6 (six) hours. May refill in two months Patient not taking: Reported on 04/27/2021 04/10/21   Eulas Post, MD  oxyCODONE (ROXICODONE) 15 MG immediate release tablet Take 1 tablet (15 mg total) by mouth every 6 (six) hours. Patient not taking: Reported on 04/27/2021 04/10/21   Eulas Post, MD  sucralfate (CARAFATE) 1 g tablet Take 1 tablet (1 g total) by mouth 4 (four) times daily -  with meals and at bedtime. Take separate from other medications Patient not taking: Reported on 04/27/2021 12/01/20   Noralyn Pick, NP    Current Outpatient Medications  Medication Sig Dispense Refill   amphetamine-dextroamphetamine (ADDERALL) 20 MG tablet Take 1 tablet (20 mg total) by mouth 3 (three) times daily. 90 tablet 0   cariprazine (VRAYLAR) 3 MG capsule Take 1 capsule (3 mg total) by mouth daily. 30 capsule 2   esomeprazole (NEXIUM) 40 MG capsule Take 1 capsule (40 mg total) by mouth 2 (two) times daily before a meal. 60 capsule 3   ondansetron (ZOFRAN-ODT) 4 MG disintegrating tablet Take 1 tablet (4 mg total) by  mouth every 8 (eight) hours as needed for nausea or vomiting. Please keep your December appointment for further refills. Thank you 20 tablet 0   oxyCODONE (ROXICODONE) 15 MG immediate release tablet Take 1 tablet (15 mg total) by mouth every 6 (six) hours. 120 tablet 0   pregabalin (LYRICA) 150 MG capsule Take 1 capsule (150 mg total) by mouth in the morning, at noon, and at bedtime. 270 capsule 1   RABEprazole (ACIPHEX) 20 MG tablet Take 1 tablet (20 mg total) by mouth in the morning and at bedtime. 90 tablet 3   sertraline (ZOLOFT) 100 MG tablet Take 2 tablets (200 mg total) by mouth daily. 180 tablet 1   oxyCODONE (ROXICODONE) 15 MG immediate release tablet Take 1 tablet (15 mg total) by mouth every 6 (six) hours. May refill in two months (Patient not taking: Reported on 04/27/2021) 120 tablet 0   oxyCODONE (ROXICODONE) 15 MG immediate release tablet Take 1 tablet (15 mg total) by mouth every 6 (six) hours. (Patient not taking: Reported on 04/27/2021) 120 tablet 0   sucralfate (CARAFATE) 1 g tablet Take 1 tablet (1 g total) by mouth 4 (four) times daily -  with meals and at bedtime. Take separate from other medications (Patient not taking: Reported on 04/27/2021) 120 tablet 0   Current Facility-Administered Medications  Medication Dose Route Frequency Provider Last Rate Last Admin   0.9 %  sodium chloride infusion  500 mL Intravenous Once Dustin Flock E, MD       0.9 %  sodium chloride infusion  500 mL Intravenous Once Daryel November, MD        Allergies as of 04/27/2021   (No Known Allergies)    Family History  Problem Relation Age of Onset   Arthritis Mother        rhematiod   Colon polyps Father    Lung disease Father    Colon polyps Sister    Diabetes Maternal Aunt    Diabetes Maternal Grandmother    Depression Neg Hx        family   Colon cancer Neg Hx    Esophageal cancer Neg Hx    Pancreatic cancer Neg Hx    Stomach cancer Neg Hx    Rectal cancer Neg Hx      Social History   Socioeconomic History   Marital status: Married    Spouse name: Not on file   Number of children: Not on file   Years of education: Not on file   Highest education level: Not on file  Occupational History   Not on file  Tobacco Use   Smoking status: Never   Smokeless tobacco: Never  Vaping Use   Vaping Use: Never used  Substance and Sexual Activity   Alcohol use: No   Drug use: Not  Currently   Sexual activity: Not on file  Other Topics Concern   Not on file  Social History Narrative   Not on file   Social Determinants of Health   Financial Resource Strain: Not on file  Food Insecurity: Not on file  Transportation Needs: Not on file  Physical Activity: Not on file  Stress: Not on file  Social Connections: Not on file  Intimate Partner Violence: Not on file    Review of Systems:  All other review of systems negative except as mentioned in the HPI.  Physical Exam: Vital signs BP 132/76   Pulse 84   Temp 97.8 F (36.6 C)   Ht 5\' 2"  (1.575 m)   Wt 160 lb (72.6 kg)   SpO2 98%   BMI 29.26 kg/m   General:   Alert,  Well-developed, well-nourished, pleasant and cooperative in NAD Airway:  Mallampati 1 Lungs:  Clear throughout to auscultation.   Heart:  Regular rate and rhythm; no murmurs, clicks, rubs,  or gallops. Abdomen:  Soft, nontender and nondistended. Normal bowel sounds.   Neuro/Psych:  Normal mood and affect. A and O x 3   Trinika Cortese E. Candis Schatz, MD [facility]

## 2021-05-01 ENCOUNTER — Other Ambulatory Visit: Payer: Self-pay

## 2021-05-01 ENCOUNTER — Telehealth: Payer: Self-pay

## 2021-05-01 DIAGNOSIS — K209 Esophagitis, unspecified without bleeding: Secondary | ICD-10-CM

## 2021-05-01 NOTE — Telephone Encounter (Signed)
  Follow up Call-  Call back number 04/27/2021 02/21/2021 12/21/2020  Post procedure Call Back phone  # [phone]  Permission to leave phone message Yes Yes No  Some recent data might be hidden     Patient questions:  Do you have a fever, pain , or abdominal swelling? No. Pain Score  0 *  Have you tolerated food without any problems? Yes.    Have you been able to return to your normal activities? Yes.    Do you have any questions about your discharge instructions: Diet   No. Medications  No. Follow up visit  No.  Do you have questions or concerns about your Care? No.  Actions: * If pain score is 4 or above: No action needed, pain <4.

## 2021-05-01 NOTE — Telephone Encounter (Signed)
Pt scheduled for repeat EGD in the [facility] 06/21/20@10am . Pt aware of appt and instructions. Referral in epic. Referral faxed to [facility] for an appt with Dr. Redmond Pulling.

## 2021-05-01 NOTE — Telephone Encounter (Signed)
-----   Message from Daryel November, MD sent at 04/27/2021  1:20 PM EST ----- [name],  Can you get this patient scheduled for a repeat EGD in 8 weeks and put in a referral to Dr. Greer Pickerel of [facility] to discuss potential fundoplication/hiatal hernia due to refractory reflux esophagitis?

## 2021-05-02 NOTE — Progress Notes (Signed)
[facility],  The biopsies of your esophagus showed severe ulceration, most likely from acid reflux.  Evidence of infection or malignancy were not seen.  Please take the rabeprazole (Aciphex) 2 tablets twice a day (4 tablets per day) until your repeat EGD next month.  Remember to take them 30-45 minutes before breakfast and dinner.  Please avoid eating within 3 hours of bedtime, and limit consumption of coffee, alcohol, peppermint, spicy/saucy foods to help reduce reflux.

## 2021-05-04 ENCOUNTER — Encounter: Payer: Self-pay | Admitting: Family Medicine

## 2021-05-05 MED ORDER — PREDNISONE 50 MG PO TABS: 50.0000 mg | ORAL_TABLET | Freq: Every day | ORAL | 0 refills | Status: DC

## 2021-05-06 ENCOUNTER — Other Ambulatory Visit: Payer: Self-pay | Admitting: Gastroenterology

## 2021-05-06 DIAGNOSIS — K209 Esophagitis, unspecified without bleeding: Secondary | ICD-10-CM

## 2021-05-06 DIAGNOSIS — K2100 Gastro-esophageal reflux disease with esophagitis, without bleeding: Secondary | ICD-10-CM

## 2021-05-15 ENCOUNTER — Ambulatory Visit (INDEPENDENT_AMBULATORY_CARE_PROVIDER_SITE_OTHER): Payer: Self-pay | Admitting: Psychiatry

## 2021-05-15 ENCOUNTER — Encounter: Payer: Self-pay | Admitting: Psychiatry

## 2021-05-15 ENCOUNTER — Other Ambulatory Visit: Payer: Self-pay

## 2021-05-15 DIAGNOSIS — G894 Chronic pain syndrome: Secondary | ICD-10-CM

## 2021-05-15 DIAGNOSIS — F4310 Post-traumatic stress disorder, unspecified: Secondary | ICD-10-CM

## 2021-05-15 DIAGNOSIS — F39 Unspecified mood [affective] disorder: Secondary | ICD-10-CM

## 2021-05-15 DIAGNOSIS — F5105 Insomnia due to other mental disorder: Secondary | ICD-10-CM

## 2021-05-15 DIAGNOSIS — F411 Generalized anxiety disorder: Secondary | ICD-10-CM

## 2021-05-15 DIAGNOSIS — F902 Attention-deficit hyperactivity disorder, combined type: Secondary | ICD-10-CM

## 2021-05-15 MED ORDER — AMPHETAMINE-DEXTROAMPHETAMINE 15 MG PO TABS: 15.0000 mg | ORAL_TABLET | Freq: Four times a day (QID) | ORAL | 0 refills | Status: DC
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
## 2021-06-01 ENCOUNTER — Other Ambulatory Visit: Payer: Self-pay | Admitting: Psychiatry

## 2021-06-01 DIAGNOSIS — G894 Chronic pain syndrome: Secondary | ICD-10-CM

## 2021-06-02 NOTE — Telephone Encounter (Signed)
Last filled 10/12 appt 11/09/21

## 2021-06-11 ENCOUNTER — Encounter: Payer: Self-pay | Admitting: Family Medicine

## 2021-06-12 MED ORDER — PREDNISONE 50 MG PO TABS: 50.0000 mg | ORAL_TABLET | Freq: Every day | ORAL | 0 refills | Status: DC

## 2021-06-13 ENCOUNTER — Telehealth: Payer: Self-pay | Admitting: Psychiatry

## 2021-06-13 NOTE — Telephone Encounter (Signed)
Noted thanks I will update

## 2021-06-13 NOTE — Telephone Encounter (Signed)
Vraylar 3mg  is approved thru Pt Assistance with [employer]. Approved 06/02/21-06/01/2022. Pt to call for refills: [phone]. Dina Rich [phone]

## 2021-06-15 ENCOUNTER — Telehealth: Payer: Self-pay | Admitting: Psychiatry

## 2021-06-15 ENCOUNTER — Other Ambulatory Visit: Payer: Self-pay

## 2021-06-15 DIAGNOSIS — F902 Attention-deficit hyperactivity disorder, combined type: Secondary | ICD-10-CM

## 2021-06-15 NOTE — Telephone Encounter (Signed)
Pt requesting Rx Adderall 15 mg  4/d [facility]. Apt 6/15

## 2021-06-15 NOTE — Telephone Encounter (Signed)
PENDED

## 2021-06-16 ENCOUNTER — Telehealth: Payer: Self-pay | Admitting: Psychiatry

## 2021-06-16 ENCOUNTER — Telehealth: Payer: Self-pay | Admitting: Family Medicine

## 2021-06-16 MED ORDER — AMPHETAMINE-DEXTROAMPHETAMINE 15 MG PO TABS: 15.0000 mg | ORAL_TABLET | Freq: Four times a day (QID) | ORAL | 0 refills | Status: DC

## 2021-06-16 NOTE — Telephone Encounter (Signed)
Pt called and said that she is completely out of her medicine and needs it today

## 2021-06-16 NOTE — Telephone Encounter (Signed)
Pharmacy called because they wanted to know if Dr.Burchette was aware that patient was receiving a high dose of Lyrica from a different doctor on top of the oxycodone from [facility]. Pharmacy is hoping for clarification    Good callback number is [phone]     Please advise

## 2021-06-16 NOTE — Telephone Encounter (Signed)
Pt informed samples pulled

## 2021-06-16 NOTE — Telephone Encounter (Signed)
Next visit is 11/09/21. [name] called and said she has gotten approved for the Vraylar in the patient assistance program but the medication has not been sent to her yet. She is out of her Vraylar 3 mg and wonders if she can get some samples to tide her over until they arrive. Her phone number is [phone].She will pick up.

## 2021-06-16 NOTE — Telephone Encounter (Signed)
Please approve

## 2021-06-20 NOTE — Telephone Encounter (Signed)
Spoke with Myriam Jacobson from the pharmacy. They are aware the patient is to remain on this medication.

## 2021-06-21 ENCOUNTER — Encounter: Payer: Self-pay | Admitting: Gastroenterology

## 2021-06-27 ENCOUNTER — Encounter: Payer: Self-pay | Admitting: Gastroenterology

## 2021-06-29 ENCOUNTER — Encounter: Payer: Self-pay | Admitting: Family Medicine

## 2021-07-03 ENCOUNTER — Encounter: Payer: Self-pay | Admitting: Family Medicine

## 2021-07-05 ENCOUNTER — Encounter: Payer: Self-pay | Admitting: Family Medicine

## 2021-07-06 MED ORDER — PREDNISONE 50 MG PO TABS
50.0000 mg | ORAL_TABLET | Freq: Every day | ORAL | 0 refills | Status: DC
Start: 2021-07-06 — End: 2021-09-06

## 2021-07-07 ENCOUNTER — Other Ambulatory Visit: Payer: Self-pay

## 2021-07-07 ENCOUNTER — Telehealth (INDEPENDENT_AMBULATORY_CARE_PROVIDER_SITE_OTHER): Payer: Self-pay | Admitting: Family Medicine

## 2021-07-07 DIAGNOSIS — R0600 Dyspnea, unspecified: Secondary | ICD-10-CM

## 2021-07-07 DIAGNOSIS — M797 Fibromyalgia: Secondary | ICD-10-CM

## 2021-07-07 DIAGNOSIS — G894 Chronic pain syndrome: Secondary | ICD-10-CM

## 2021-07-07 MED ORDER — OXYCODONE HCL 15 MG PO TABS: 15.0000 mg | ORAL_TABLET | Freq: Four times a day (QID) | ORAL | 0 refills | Status: DC

## 2021-07-07 NOTE — Progress Notes (Signed)
Patient ID: Toni Collins, female   DOB: November 06, 1965, 56 y.o.   MRN: [id number]   This visit type was conducted due to national recommendations for restrictions regarding the COVID-19 pandemic in an effort to limit this patient's exposure and mitigate transmission in our community.   Virtual Visit via Video Note  I connected with [name] on 07/07/21 at  8:00 AM EST by a video enabled telemedicine application and verified that I am speaking with the correct person using two identifiers.  Location patient: home Location provider:work or home office Persons participating in the virtual visit: patient, provider  I discussed the limitations of evaluation and management by telemedicine and the availability of in person appointments. The patient expressed understanding and agreed to proceed.   HPI:  [name] and her husband relate having COVID back in December.  She still has some fatigue and dyspnea with exertion.  No chest pain.  No cough.  She may have had some low-grade fever couple weeks ago but none since then.  No edema.  No orthopnea.  She has had severe esophagitis which does not seem to be responding much to PPIs.  No malignancy or H. pylori.  Pending repeat endoscopy per GI in about a month.  Still having some heartburn type symptoms.  No significant dysphagia.  History of iron deficiency anemia.  Recent colonoscopy unremarkable.  Most recent hemoglobin 10.1.  Chronic pain syndrome.  She has had chronic pain for years with history of severe fibromyalgia pain and chronic neck and back pain.  She is on oxycodone and has been on this regimen for years.  She has tried multiple conservative measures without improvement.  She is on Lyrica but has scaled back to twice daily.  She takes Lyrica per psychiatry.  She is overdue for repeat drug screen.   ROS: See pertinent positives and negatives per HPI.  Past Medical History:  Diagnosis Date   ADHD    Anemia    Anxiety     Arthritis    Colon polyps    DEPRESSION 09/02/2008   Fibromyalgia    GERD (gastroesophageal reflux disease)    GRIEF REACTION 09/30/2009   INSOMNIA, CHRONIC 09/02/2008   PREDIABETES 03/10/2009   PTSD (post-traumatic stress disorder)     Past Surgical History:  Procedure Laterality Date   ABDOMINAL HYSTERECTOMY  05/28/2000   TAH   COLONOSCOPY     DIAGNOSTIC LAPAROSCOPY  1993, 1994, 1998, 2000   x4   TMJ ARTHROPLASTY     x2   UPPER GASTROINTESTINAL ENDOSCOPY      Family History  Problem Relation Age of Onset   Arthritis Mother        rhematiod   Colon polyps Father    Lung disease Father    Colon polyps Sister    Diabetes Maternal Aunt    Diabetes Maternal Grandmother    Depression Neg Hx        family   Colon cancer Neg Hx    Esophageal cancer Neg Hx    Pancreatic cancer Neg Hx    Stomach cancer Neg Hx    Rectal cancer Neg Hx     SOCIAL HX: Non-smoker.  No alcohol use.   Current Outpatient Medications:    amphetamine-dextroamphetamine (ADDERALL) 15 MG tablet, Take 1 tablet by mouth 4 (four) times daily., Disp: 120 tablet, Rfl: 0   [START ON 07/13/2021] amphetamine-dextroamphetamine (ADDERALL) 15 MG tablet, Take 1 tablet by mouth 4 (four) times daily., Disp: 120 tablet, Rfl: 0   [  START ON 08/10/2021] amphetamine-dextroamphetamine (ADDERALL) 15 MG tablet, Take 1 tablet by mouth 4 (four) times daily., Disp: 120 tablet, Rfl: 0   cariprazine (VRAYLAR) 3 MG capsule, Take 1 capsule (3 mg total) by mouth daily., Disp: 30 capsule, Rfl: 2   esomeprazole (NEXIUM) 40 MG capsule, Take 1 capsule (40 mg total) by mouth 2 (two) times daily before a meal., Disp: 60 capsule, Rfl: 3   LYRICA 150 MG capsule, Take 1 capsule by mouth every morning 1 capsule at noon and 1 capsule every night at bedtime as directed by physician., Disp: 270 capsule, Rfl: 0   ondansetron (ZOFRAN-ODT) 4 MG disintegrating tablet, Take 1 tablet (4 mg total) by mouth every 8 (eight) hours as needed for nausea or  vomiting. Please keep your December appointment for further refills. Thank you, Disp: 20 tablet, Rfl: 0   oxyCODONE (ROXICODONE) 15 MG immediate release tablet, Take 1 tablet (15 mg total) by mouth every 6 (six) hours., Disp: 120 tablet, Rfl: 0   oxyCODONE (ROXICODONE) 15 MG immediate release tablet, Take 1 tablet (15 mg total) by mouth every 6 (six) hours. May refill in two months, Disp: 120 tablet, Rfl: 0   oxyCODONE (ROXICODONE) 15 MG immediate release tablet, Take 1 tablet (15 mg total) by mouth every 6 (six) hours., Disp: 120 tablet, Rfl: 0   predniSONE (DELTASONE) 50 MG tablet, Take 1 tablet (50 mg total) by mouth daily., Disp: 5 tablet, Rfl: 0   RABEprazole (ACIPHEX) 20 MG tablet, TAKE 2 TABLETS BY MOUTH 2 TIMES DAILY., Disp: 360 tablet, Rfl: 1   sertraline (ZOLOFT) 100 MG tablet, Take 2 tablets (200 mg total) by mouth daily., Disp: 180 tablet, Rfl: 1   sucralfate (CARAFATE) 1 g tablet, Take 1 tablet (1 g total) by mouth 4 (four) times daily -  with meals and at bedtime. Take separate from other medications, Disp: 120 tablet, Rfl: 0   RABEprazole (ACIPHEX) 20 MG tablet, Take 1 tablet (20 mg total) by mouth in the morning and at bedtime., Disp: 90 tablet, Rfl: 3  Current Facility-Administered Medications:    0.9 %  sodium chloride infusion, 500 mL, Intravenous, Once, Cunningham, Scott E, MD   0.9 %  sodium chloride infusion, 500 mL, Intravenous, Once, Candis Schatz, Gladstone Pih, MD  EXAM:  VITALS per patient if applicable:  GENERAL: alert, oriented, appears well and in no acute distress  HEENT: atraumatic, conjunttiva clear, no obvious abnormalities on inspection of external nose and ears  NECK: normal movements of the head and neck  LUNGS: on inspection no signs of respiratory distress, breathing rate appears normal, no obvious gross SOB, gasping or wheezing  CV: no obvious cyanosis  MS: moves all visible extremities without noticeable abnormality  PSYCH/NEURO: pleasant and cooperative,  no obvious depression or anxiety, speech and thought processing grossly intact  ASSESSMENT AND PLAN:  Discussed the following assessment and plan:  #1 patient complains of dyspnea followed COVID infection back in December.  She had some ongoing fatigue issues.  We recommend that she really needs to be seen in person for further evaluation with persistent dyspnea symptoms.  She is not relating peripheral edema, chest pain, asymmetric leg edema, etc.  We discussed broad differential for dyspnea and strongly advised office follow-up to further assess  #2 history of severe esophagitis and iron deficiency anemia.  Followed by GI.  Currently on high-dose PPI with pending repeat endoscopy  #3 chronic pain syndrome.  Patient on currently applying for disability.  Only oxycodone 4 times  daily.  PDMP reviewed.  She is due for repeat drug screen and needs office evaluation in person next visit     I discussed the assessment and treatment plan with the patient. The patient was provided an opportunity to ask questions and all were answered. The patient agreed with the plan and demonstrated an understanding of the instructions.   The patient was advised to call back or seek an in-person evaluation if the symptoms worsen or if the condition fails to improve as anticipated.     Carolann Littler, MD

## 2021-07-10 ENCOUNTER — Ambulatory Visit: Payer: Self-pay | Admitting: Family Medicine

## 2021-07-24 ENCOUNTER — Other Ambulatory Visit: Payer: Self-pay

## 2021-07-24 ENCOUNTER — Ambulatory Visit (AMBULATORY_SURGERY_CENTER): Payer: Self-pay | Admitting: Gastroenterology

## 2021-07-24 ENCOUNTER — Encounter: Payer: Self-pay | Admitting: Gastroenterology

## 2021-07-24 VITALS — BP 96/56 | HR 74 | Temp 97.8°F | Resp 20 | Ht 62.0 in | Wt 160.0 lb

## 2021-07-24 DIAGNOSIS — K209 Esophagitis, unspecified without bleeding: Secondary | ICD-10-CM

## 2021-07-24 DIAGNOSIS — K2100 Gastro-esophageal reflux disease with esophagitis, without bleeding: Secondary | ICD-10-CM

## 2021-07-24 DIAGNOSIS — K449 Diaphragmatic hernia without obstruction or gangrene: Secondary | ICD-10-CM

## 2021-07-24 MED ORDER — SODIUM CHLORIDE 0.9 % IV SOLN: 500.0000 mL | Freq: Once | INTRAVENOUS | Status: DC

## 2021-07-24 NOTE — Progress Notes (Signed)
Pt in recovery with monitors in place, VSS. Report given to receiving RN. Bite guard was placed with pt awake to ensure comfort. No dental or soft tissue damage noted. 

## 2021-07-24 NOTE — Progress Notes (Signed)
[facility] History and Physical   Primary Care Physician:  Eulas Post, MD   Reason for Procedure:   Follow up reflux esophagitis  Plan:    EGD     HPI: Toni Collins is a 56 y.o. female undergoing repeat EGD to assess healing of reflux esophagitis.  After her last EGD she was started on Aciphex 40 mg PO BID and she reports significant improvement in her GERD symptoms since then.   Past Medical History:  Diagnosis Date   ADHD    Anemia    Anxiety    Arthritis    Colon polyps    DEPRESSION 09/02/2008   Fibromyalgia    GERD (gastroesophageal reflux disease)    GRIEF REACTION 09/30/2009   INSOMNIA, CHRONIC 09/02/2008   PREDIABETES 03/10/2009   PTSD (post-traumatic stress disorder)     Past Surgical History:  Procedure Laterality Date   ABDOMINAL HYSTERECTOMY  05/28/2000   TAH   COLONOSCOPY     DIAGNOSTIC LAPAROSCOPY  1993, 1994, 1998, 2000   x4   TMJ ARTHROPLASTY     x2   UPPER GASTROINTESTINAL ENDOSCOPY      Prior to Admission medications   Medication Sig Start Date End Date Taking? Authorizing Provider  amphetamine-dextroamphetamine (ADDERALL) 15 MG tablet Take 1 tablet by mouth 4 (four) times daily. 06/16/21  Yes Cottle, Billey Co., MD  cariprazine (VRAYLAR) 3 MG capsule Take 1 capsule (3 mg total) by mouth daily. 11/17/20  Yes Cottle, Billey Co., MD  esomeprazole (NEXIUM) 40 MG capsule Take 1 capsule (40 mg total) by mouth 2 (two) times daily before a meal. 12/01/20  Yes Kennedy-Smith, Patrecia Pour, NP  LYRICA 150 MG capsule Take 1 capsule by mouth every morning 1 capsule at noon and 1 capsule every night at bedtime as directed by physician. 06/05/21  Yes Cottle, Billey Co., MD  ondansetron (ZOFRAN-ODT) 4 MG disintegrating tablet Take 1 tablet (4 mg total) by mouth every 8 (eight) hours as needed for nausea or vomiting. Please keep your December appointment for further refills. Thank you 04/02/21  Yes Daryel November, MD  oxyCODONE  (ROXICODONE) 15 MG immediate release tablet Take 1 tablet (15 mg total) by mouth every 6 (six) hours. May refill in two months 07/07/21  Yes Burchette, Alinda Sierras, MD  sertraline (ZOLOFT) 100 MG tablet Take 2 tablets (200 mg total) by mouth daily. 11/17/20  Yes Cottle, Billey Co., MD  predniSONE (DELTASONE) 50 MG tablet Take 1 tablet (50 mg total) by mouth daily. Patient not taking: Reported on 07/24/2021 07/06/21   Gregor Hams, MD  RABEprazole (ACIPHEX) 20 MG tablet Take 1 tablet (20 mg total) by mouth in the morning and at bedtime. 02/21/21 04/27/21  Daryel November, MD  RABEprazole (ACIPHEX) 20 MG tablet TAKE 2 TABLETS BY MOUTH 2 TIMES DAILY. Patient not taking: Reported on 07/24/2021 05/08/21   Daryel November, MD  sucralfate (CARAFATE) 1 g tablet Take 1 tablet (1 g total) by mouth 4 (four) times daily -  with meals and at bedtime. Take separate from other medications Patient not taking: Reported on 07/24/2021 12/01/20   Noralyn Pick, NP    Current Outpatient Medications  Medication Sig Dispense Refill   amphetamine-dextroamphetamine (ADDERALL) 15 MG tablet Take 1 tablet by mouth 4 (four) times daily. 120 tablet 0   cariprazine (VRAYLAR) 3 MG capsule Take 1 capsule (3 mg total) by mouth daily. 30 capsule 2   esomeprazole (NEXIUM) 40  MG capsule Take 1 capsule (40 mg total) by mouth 2 (two) times daily before a meal. 60 capsule 3   LYRICA 150 MG capsule Take 1 capsule by mouth every morning 1 capsule at noon and 1 capsule every night at bedtime as directed by physician. 270 capsule 0   ondansetron (ZOFRAN-ODT) 4 MG disintegrating tablet Take 1 tablet (4 mg total) by mouth every 8 (eight) hours as needed for nausea or vomiting. Please keep your December appointment for further refills. Thank you 20 tablet 0   oxyCODONE (ROXICODONE) 15 MG immediate release tablet Take 1 tablet (15 mg total) by mouth every 6 (six) hours. May refill in two months 120 tablet 0   sertraline (ZOLOFT) 100 MG  tablet Take 2 tablets (200 mg total) by mouth daily. 180 tablet 1   predniSONE (DELTASONE) 50 MG tablet Take 1 tablet (50 mg total) by mouth daily. (Patient not taking: Reported on 07/24/2021) 5 tablet 0   RABEprazole (ACIPHEX) 20 MG tablet Take 1 tablet (20 mg total) by mouth in the morning and at bedtime. 90 tablet 3   RABEprazole (ACIPHEX) 20 MG tablet TAKE 2 TABLETS BY MOUTH 2 TIMES DAILY. (Patient not taking: Reported on 07/24/2021) 360 tablet 1   sucralfate (CARAFATE) 1 g tablet Take 1 tablet (1 g total) by mouth 4 (four) times daily -  with meals and at bedtime. Take separate from other medications (Patient not taking: Reported on 07/24/2021) 120 tablet 0   Current Facility-Administered Medications  Medication Dose Route Frequency Provider Last Rate Last Admin   0.9 %  sodium chloride infusion  500 mL Intravenous Once Dustin Flock E, MD       0.9 %  sodium chloride infusion  500 mL Intravenous Once Dustin Flock E, MD       0.9 %  sodium chloride infusion  500 mL Intravenous Once Daryel November, MD        Allergies as of 07/24/2021   (No Known Allergies)    Family History  Problem Relation Age of Onset   Arthritis Mother        rhematiod   Colon polyps Father    Lung disease Father    Colon polyps Sister    Diabetes Maternal Aunt    Diabetes Maternal Grandmother    Depression Neg Hx        family   Colon cancer Neg Hx    Esophageal cancer Neg Hx    Pancreatic cancer Neg Hx    Stomach cancer Neg Hx    Rectal cancer Neg Hx     Social History   Socioeconomic History   Marital status: Married    Spouse name: Not on file   Number of children: Not on file   Years of education: Not on file   Highest education level: Not on file  Occupational History   Not on file  Tobacco Use   Smoking status: Never   Smokeless tobacco: Never  Vaping Use   Vaping Use: Never used  Substance and Sexual Activity   Alcohol use: No   Drug use: Not Currently   Sexual  activity: Not on file  Other Topics Concern   Not on file  Social History Narrative   Not on file   Social Determinants of Health   Financial Resource Strain: Not on file  Food Insecurity: Not on file  Transportation Needs: Not on file  Physical Activity: Not on file  Stress: Not on file  Social  Connections: Not on file  Intimate Partner Violence: Not on file    Review of Systems:  All other review of systems negative except as mentioned in the HPI.  Physical Exam: Vital signs BP 115/80    Pulse 73    Temp 97.8 F (36.6 C)    Resp 14    Ht 5\' 2"  (1.575 m)    Wt 160 lb (72.6 kg)    SpO2 100%    BMI 29.26 kg/m   General:   Alert,  Well-developed, well-nourished, pleasant and cooperative in NAD Airway:  Mallampati 2 Lungs:  Clear throughout to auscultation.   Heart:  Regular rate and rhythm; no murmurs, clicks, rubs,  or gallops. Abdomen:  Soft, nontender and nondistended. Normal bowel sounds.   Neuro/Psych:  Normal mood and affect. A and O x 3   Hoang Reich E. Candis Schatz, MD [facility]

## 2021-07-24 NOTE — Progress Notes (Signed)
Called to room to assist during endoscopic procedure.  Patient ID and intended procedure confirmed with present staff. Received instructions for my participation in the procedure from the performing physician.  

## 2021-07-24 NOTE — Op Note (Signed)
[facility] Patient Name: Toni Collins Procedure Date: 07/24/2021 3:24 PM MRN: [id number] Endoscopist: Nicki Reaper E. Candis Schatz , MD Age: 56 Referring MD:  Date of Birth: 10-14-1965 Gender: Female Account #: [id number] Procedure:                Upper GI endoscopy Indications:              Follow-up of reflux esophagitis. Patient reports                            complete resolution of GERD symptoms with BID                            Aciphex Medicines:                Monitored Anesthesia Care Procedure:                Pre-Anesthesia Assessment:                           - Prior to the procedure, a History and Physical                            was performed, and patient medications and                            allergies were reviewed. The patient's tolerance of                            previous anesthesia was also reviewed. The risks                            and benefits of the procedure and the sedation                            options and risks were discussed with the patient.                            All questions were answered, and informed consent                            was obtained. Prior Anticoagulants: The patient has                            taken no previous anticoagulant or antiplatelet                            agents. ASA Grade Assessment: II - A patient with                            mild systemic disease. After reviewing the risks                            and benefits, the patient was deemed in  satisfactory condition to undergo the procedure.                           After obtaining informed consent, the endoscope was                            passed under direct vision. Throughout the                            procedure, the patient's blood pressure, pulse, and                            oxygen saturations were monitored continuously. The                            Endoscope was introduced through the mouth,  and                            advanced to the third part of duodenum. The upper                            GI endoscopy was accomplished without difficulty.                            The patient tolerated the procedure well. Scope In: Scope Out: Findings:                 The examined portions of the nasopharynx,                            oropharynx and larynx were normal.                           LA Grade C (one or more mucosal breaks continuous                            between tops of 2 or more mucosal folds, less than                            75% circumference) esophagitis with no bleeding was                            found. Biopsies were taken with a cold forceps for                            histology. Estimated blood loss was minimal. The                            ulceration was noted to be in a linear spiral                            pattern in the distal esophagus  A 5 cm hiatal hernia was present. During the exam,                            it was noted that the a fundic fold would prolapse                            up into the esophagus during respirations.                           The exam of the stomach was otherwise normal.                           The examined duodenum was normal. Complications:            No immediate complications. Estimated Blood Loss:     Estimated blood loss was minimal. Impression:               - The examined portions of the nasopharynx,                            oropharynx and larynx were normal.                           - LA Grade C esophagitis with no bleeding.                            Biopsied. Given the complete resolution of GERD                            symptoms, the atypical apprearance of the                            esophagitis, and the witnessed prolapse of fundic                            gastric folds into the esophagus, query whether the                            persistent esophagitis  seen today maybe secondary                            to mechanical factors from the hiatal hernia,                            rather than uncontrolled acid reflux.                           - 5 cm hiatal hernia.                           - Normal examined duodenum. Recommendation:           - Patient has a contact number available for  emergencies. The signs and symptoms of potential                            delayed complications were discussed with the                            patient. Return to normal activities tomorrow.                            Written discharge instructions were provided to the                            patient.                           - Resume previous diet.                           - Continue Aciphex 40 mg PO BID                           - Follow up with general surgery.                           - pH/impedance testing on PPI could help further                            determine the role of acid reflux in the patient's                            persistent esophagitis. Gian Ybarra E. Candis Schatz, MD 07/24/2021 4:01:12 PM This report has been signed electronically.

## 2021-07-24 NOTE — Patient Instructions (Signed)
YOU HAD AN ENDOSCOPIC PROCEDURE TODAY AT THE [facility]:   Refer to the procedure report that was given to you for any specific questions about what was found during the examination.  If the procedure report does not answer your questions, please call your gastroenterologist to clarify.  If you requested that your care partner not be given the details of your procedure findings, then the procedure report has been included in a sealed envelope for you to review at your convenience later.  YOU SHOULD EXPECT: Some feelings of bloating in the abdomen. Passage of more gas than usual.  Walking can help get rid of the air that was put into your GI tract during the procedure and reduce the bloating. If you had a lower endoscopy (such as a colonoscopy or flexible sigmoidoscopy) you may notice spotting of blood in your stool or on the toilet paper. If you underwent a bowel prep for your procedure, you may not have a normal bowel movement for a few days.  Please Note:  You might notice some irritation and congestion in your nose or some drainage.  This is from the oxygen used during your procedure.  There is no need for concern and it should clear up in a day or so.  SYMPTOMS TO REPORT IMMEDIATELY:    Following upper endoscopy (EGD)  Vomiting of blood or coffee ground material  New chest pain or pain under the shoulder blades  Painful or persistently difficult swallowing  New shortness of breath  Fever of 100F or higher  Black, tarry-looking stools  For urgent or emergent issues, a gastroenterologist can be reached at any hour by calling [phone]. Do not use MyChart messaging for urgent concerns.    DIET:  We do recommend a small meal at first, but then you may proceed to your regular diet.  Drink plenty of fluids but you should avoid alcoholic beverages for 24 hours.  ACTIVITY:  You should plan to take it easy for the rest of today and you should NOT DRIVE or use heavy machinery  until tomorrow (because of the sedation medicines used during the test).    FOLLOW UP: Our staff will call the number listed on your records 48-72 hours following your procedure to check on you and address any questions or concerns that you may have regarding the information given to you following your procedure. If we do not reach you, we will leave a message.  We will attempt to reach you two times.  During this call, we will ask if you have developed any symptoms of COVID 19. If you develop any symptoms (ie: fever, flu-like symptoms, shortness of breath, cough etc.) before then, please call [phone].  If you test positive for Covid 19 in the 2 weeks post procedure, please call and report this information to us.    If any biopsies were taken you will be contacted by phone or by letter within the next 1-3 weeks.  Please call us at [phone] if you have not heard about the biopsies in 3 weeks.    SIGNATURES/CONFIDENTIALITY: You and/or your care partner have signed paperwork which will be entered into your electronic medical record.  These signatures attest to the fact that that the information above on your After Visit Summary has been reviewed and is understood.  Full responsibility of the confidentiality of this discharge information lies with you and/or your care-partner. 

## 2021-07-25 ENCOUNTER — Other Ambulatory Visit: Payer: Self-pay

## 2021-07-25 ENCOUNTER — Telehealth: Payer: Self-pay

## 2021-07-25 DIAGNOSIS — K2100 Gastro-esophageal reflux disease with esophagitis, without bleeding: Secondary | ICD-10-CM

## 2021-07-25 NOTE — Telephone Encounter (Signed)
-----   Message from Daryel November, MD sent at 07/24/2021  4:46 PM EST ----- [name],  Can you place an order for pH/impedance and esophageal manometry ON PPI for Ms. Toni Collins? Thanks

## 2021-07-25 NOTE — Telephone Encounter (Signed)
Pt scheduled for EM with ph/impedance at [facility] 08/16/21 at 12:30pm. Instructions and appt information sent to pt via mychart. Amb referral in epic.

## 2021-07-26 ENCOUNTER — Telehealth: Payer: Self-pay

## 2021-07-26 ENCOUNTER — Telehealth: Payer: Self-pay | Admitting: *Deleted

## 2021-07-26 NOTE — Telephone Encounter (Signed)
First attempt follow up call to pt, lm for pt to call if having any problems or questions, otherwise we will call them back later this morning or early this afternoon.  °

## 2021-07-26 NOTE — Telephone Encounter (Signed)
?  Follow up Call- ? ?Call back number 07/24/2021 04/27/2021 02/21/2021 12/21/2020  ?Post procedure Call Back phone  # [phone]  ?Permission to leave phone message Yes Yes Yes No  ?Some recent data might be hidden  ?  ? ?Patient questions: ? ?Do you have a fever, pain , or abdominal swelling? No. ?Pain Score  0 * ? ?Have you tolerated food without any problems? Yes.   ? ?Have you been able to return to your normal activities? Yes.   ? ?Do you have any questions about your discharge instructions: ?Diet   No. ?Medications  No. ?Follow up visit  No. ? ?Do you have questions or concerns about your Care? No. ? ?Actions: ?* If pain score is 4 or above: ?No action needed, pain <4. ? ? ?

## 2021-07-28 ENCOUNTER — Telehealth: Payer: Self-pay | Admitting: Psychiatry

## 2021-07-28 ENCOUNTER — Ambulatory Visit: Payer: Self-pay

## 2021-07-28 ENCOUNTER — Ambulatory Visit (INDEPENDENT_AMBULATORY_CARE_PROVIDER_SITE_OTHER): Payer: Self-pay

## 2021-07-28 ENCOUNTER — Other Ambulatory Visit: Payer: Self-pay | Admitting: Psychiatry

## 2021-07-28 ENCOUNTER — Ambulatory Visit (INDEPENDENT_AMBULATORY_CARE_PROVIDER_SITE_OTHER): Payer: Self-pay | Admitting: Family Medicine

## 2021-07-28 ENCOUNTER — Other Ambulatory Visit: Payer: Self-pay

## 2021-07-28 DIAGNOSIS — M25512 Pain in left shoulder: Secondary | ICD-10-CM

## 2021-07-28 DIAGNOSIS — M25511 Pain in right shoulder: Secondary | ICD-10-CM

## 2021-07-28 DIAGNOSIS — G8929 Other chronic pain: Secondary | ICD-10-CM

## 2021-07-28 DIAGNOSIS — F902 Attention-deficit hyperactivity disorder, combined type: Secondary | ICD-10-CM

## 2021-07-28 IMAGING — DX DG SHOULDER 2+V*R*
3 series · 3 of 3 positions shown · non-contrast
Comparison: None.

CLINICAL DATA: Bilateral shoulder pain.

EXAM:
RIGHT SHOULDER - 2+ VIEW

[shoulder ap (1 of 2)]
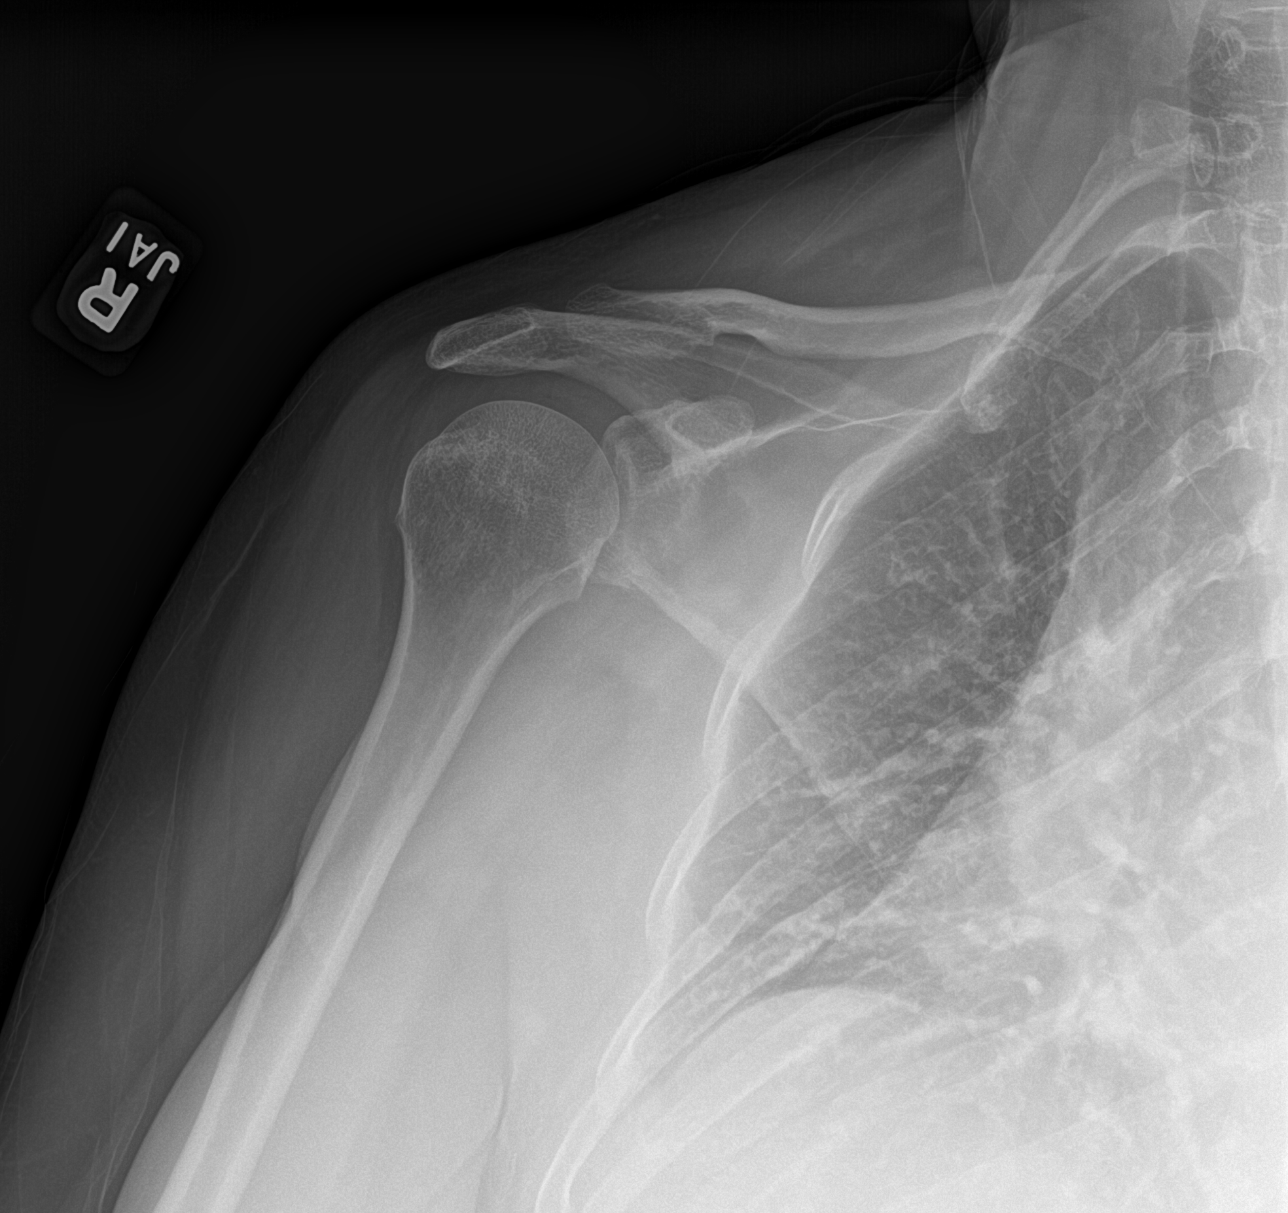

[shoulder ap (2 of 2)]
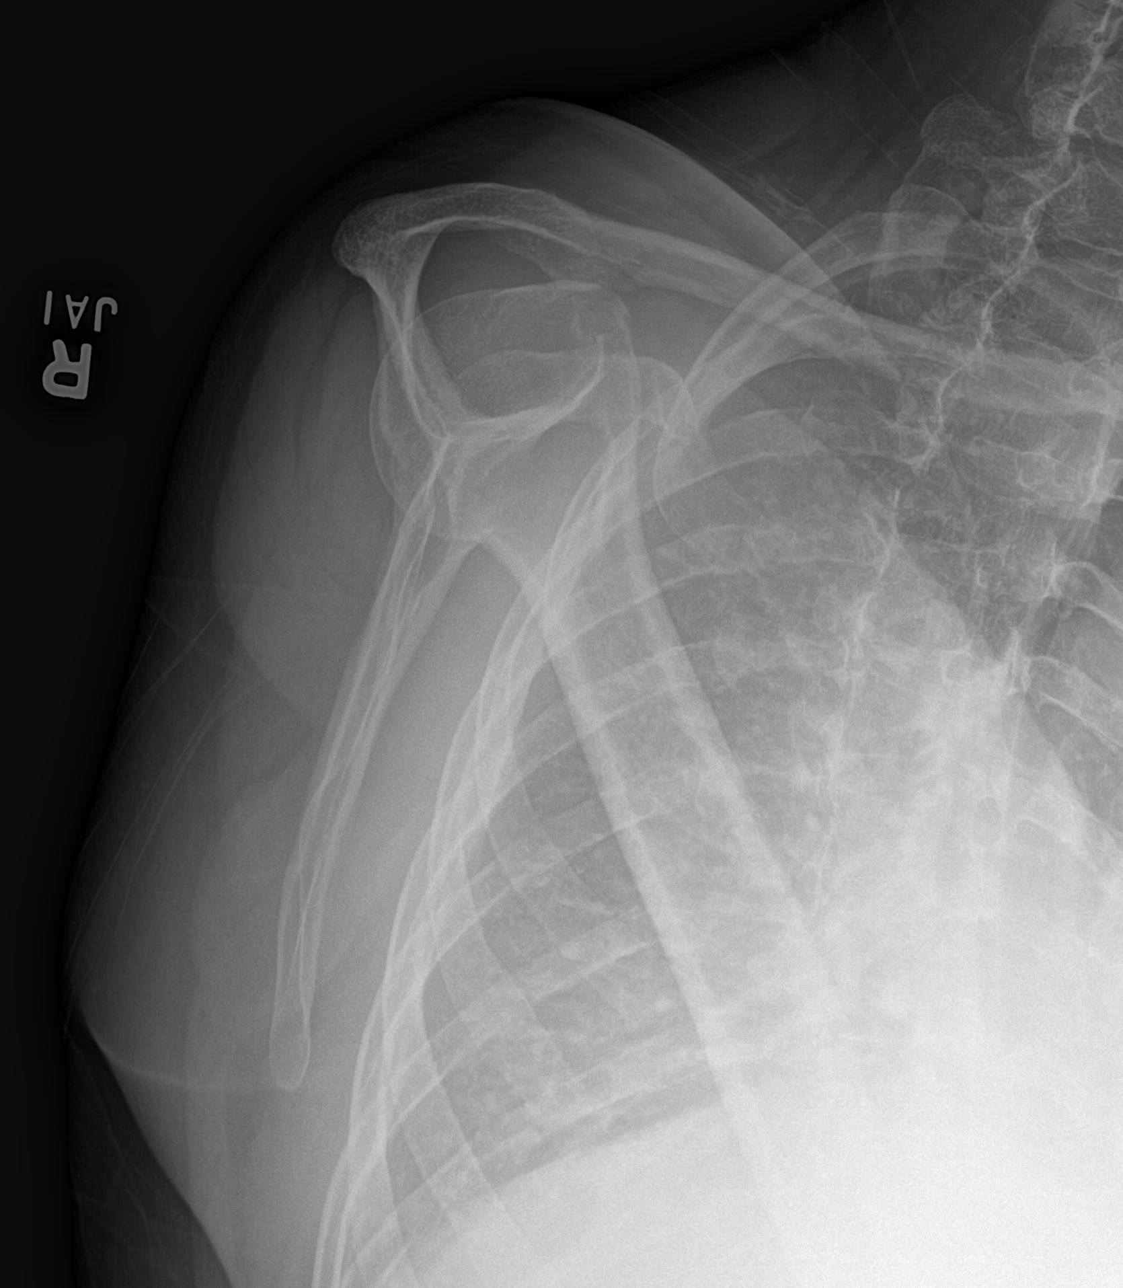

[shoulder axial]
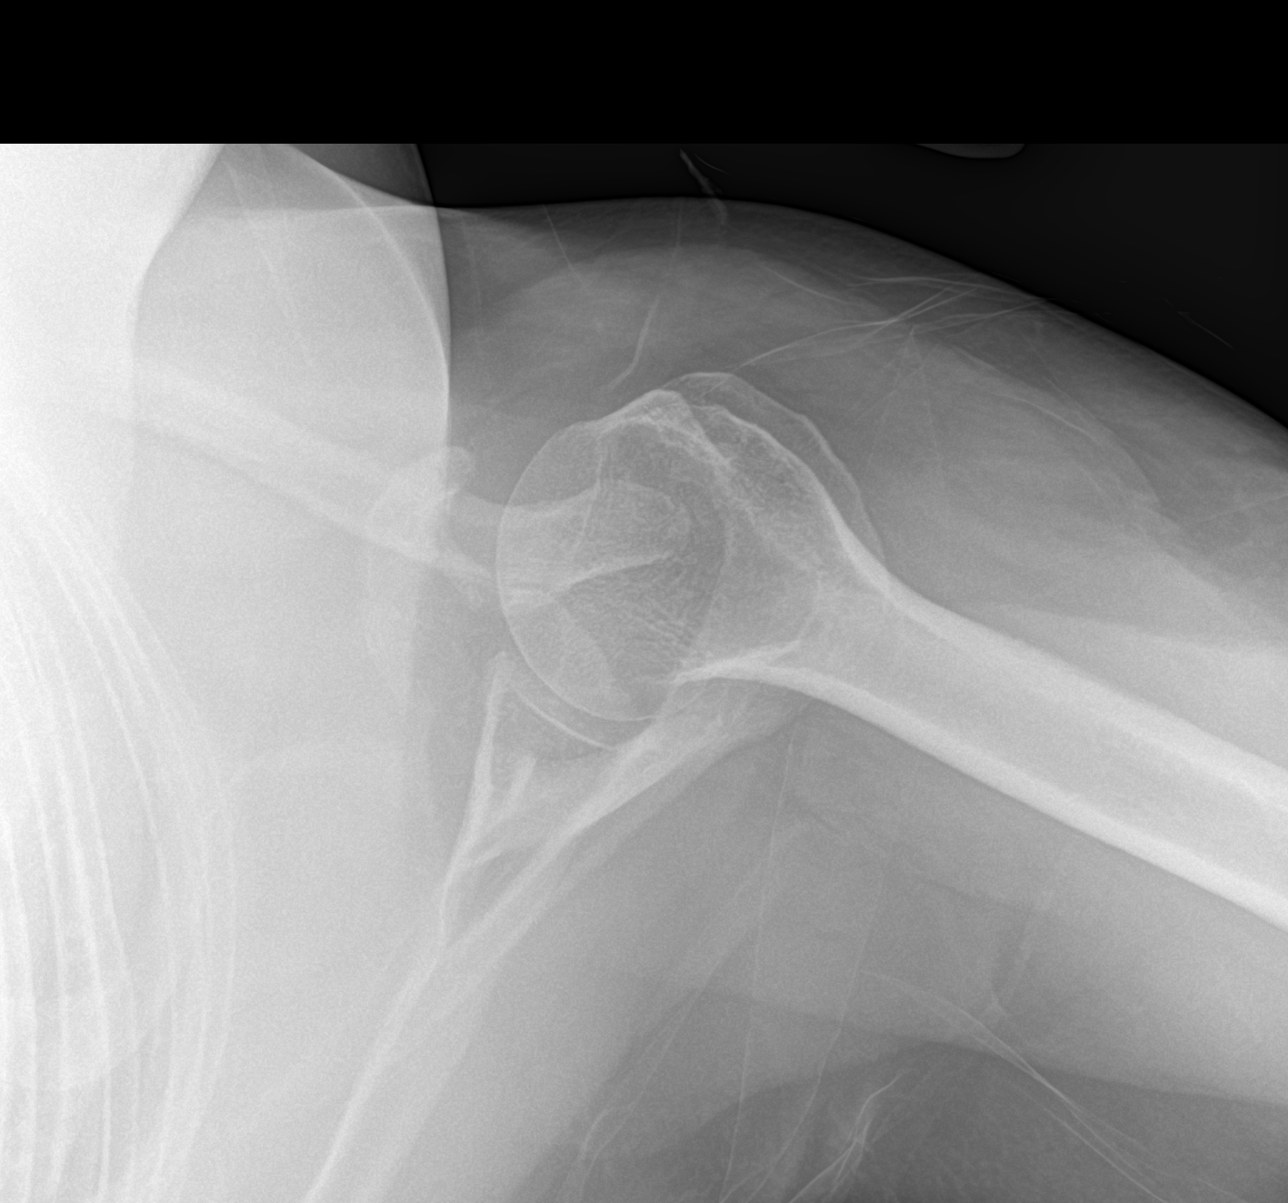

[3 of 3 positions shown; findings below may reference images not displayed]

FINDINGS: There is no evidence of fracture or dislocation. There is no
evidence of arthropathy or other focal bone abnormality. Soft
tissues are unremarkable.
IMPRESSION: Negative.

## 2021-07-28 IMAGING — DX DG SHOULDER 2+V*L*
3 series · 3 of 3 positions shown · non-contrast
Comparison: None.

CLINICAL DATA: Chronic, atraumatic bilateral shoulder pain.

EXAM:
LEFT SHOULDER - 2+ VIEW

[shoulder ap (1 of 2)]
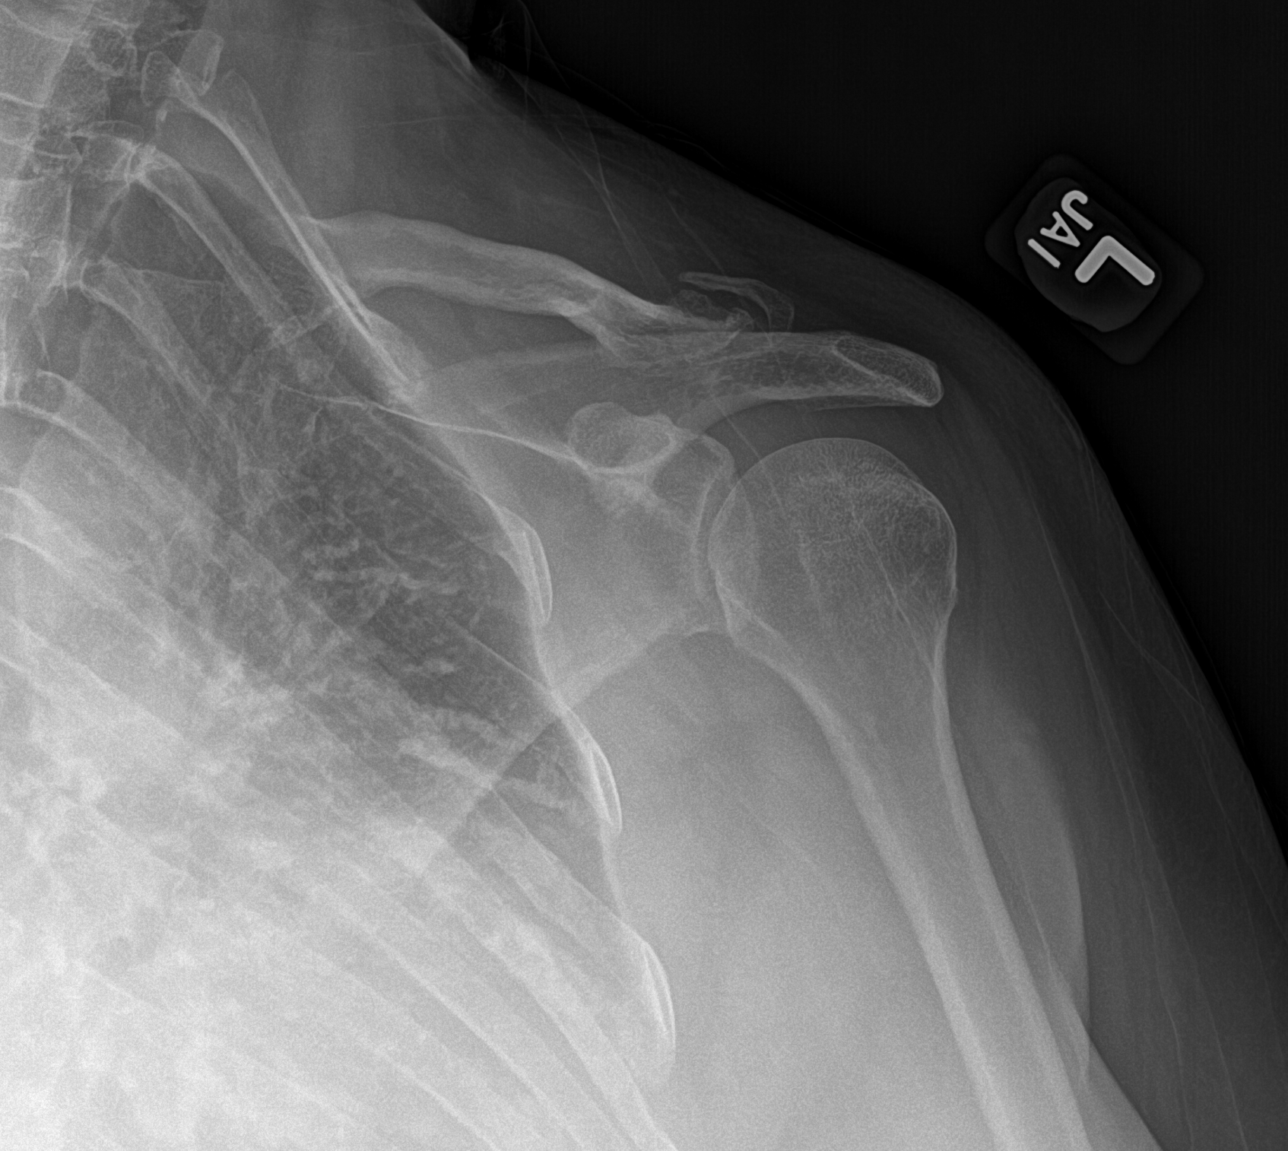

[shoulder ap (2 of 2)]
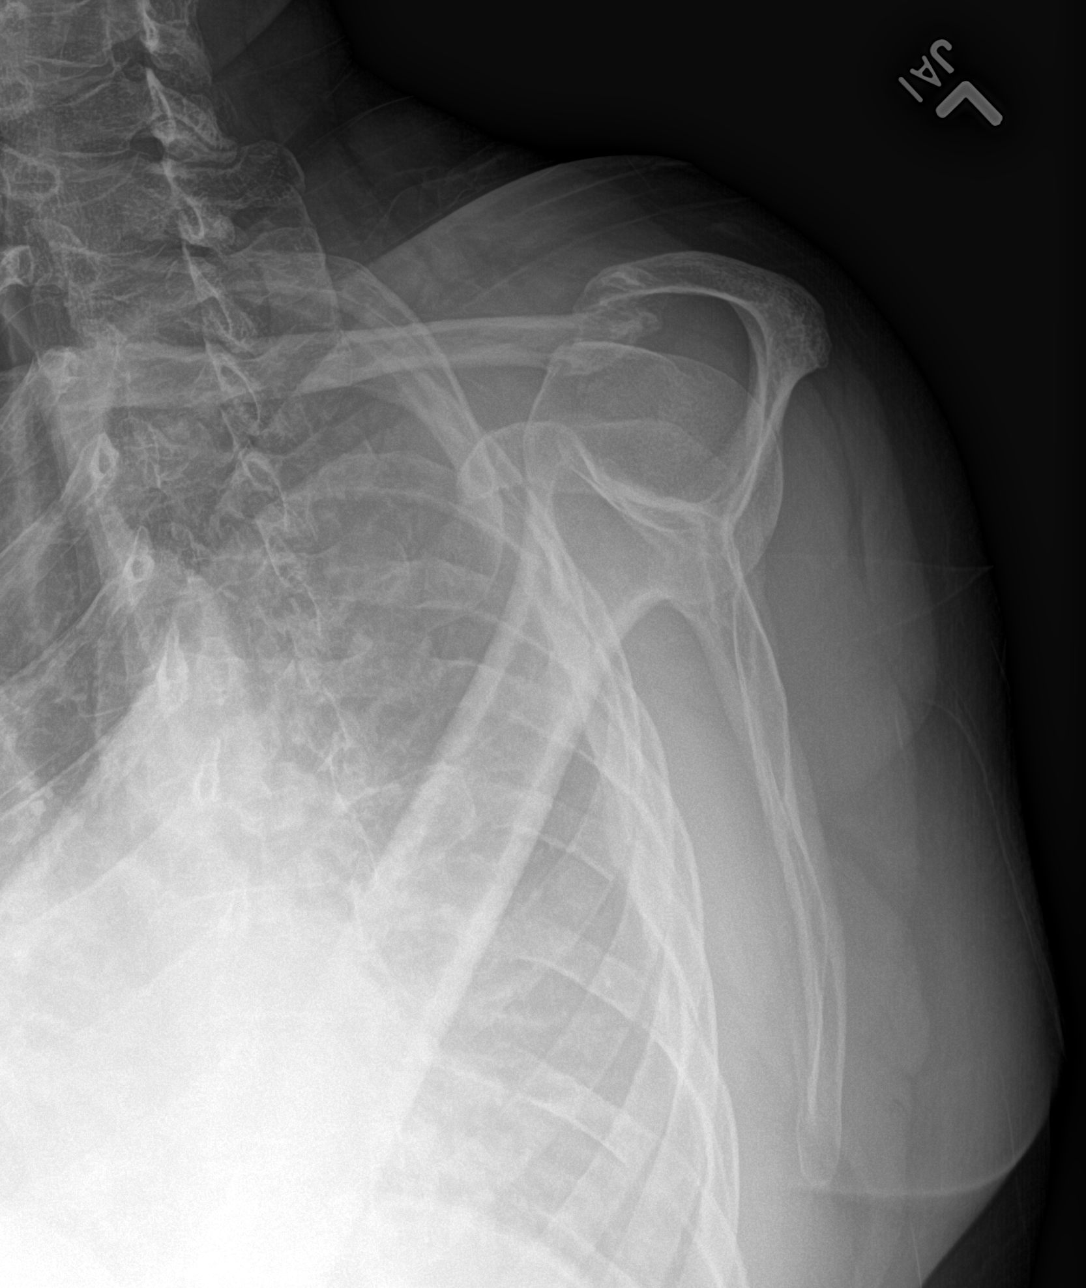

[shoulder axial]
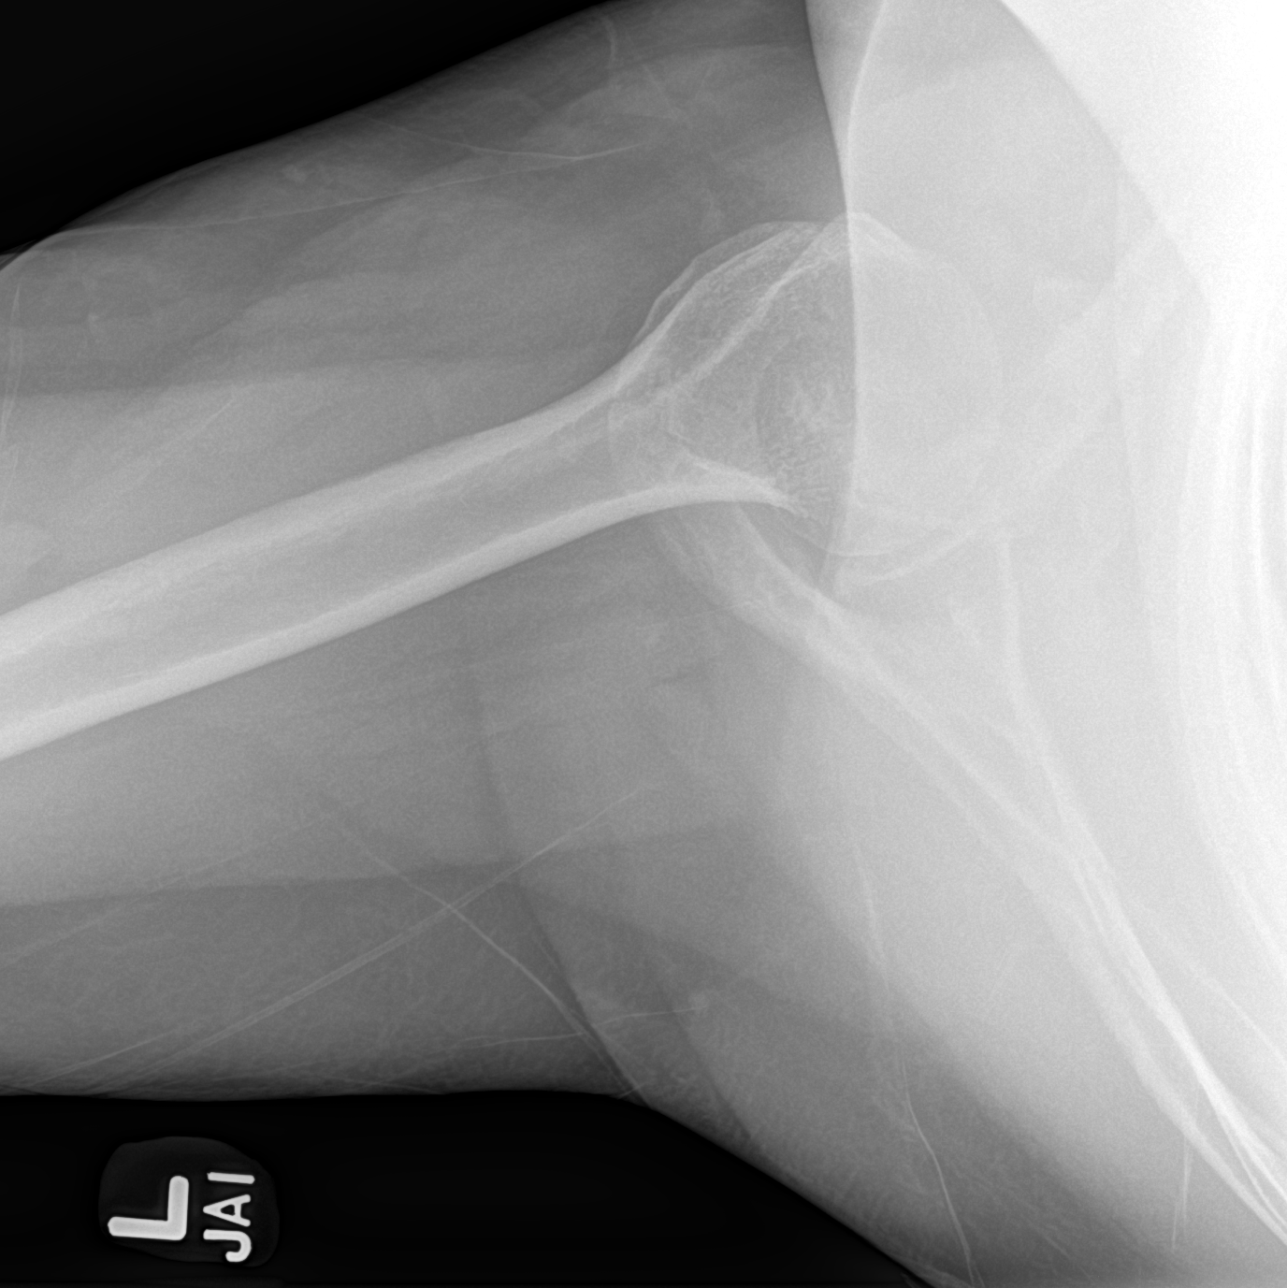

[3 of 3 positions shown; findings below may reference images not displayed]

FINDINGS: There is no evidence of an acute fracture or dislocation. A chronic
deformity of the distal left clavicle is noted. There is no evidence
of arthropathy. Soft tissues are unremarkable.
IMPRESSION: 1. Chronic deformity of the distal left clavicle.
2. No acute osseous abnormality.

## 2021-07-28 MED ORDER — AMPHETAMINE-DEXTROAMPHETAMINE 15 MG PO TABS: 15.0000 mg | ORAL_TABLET | Freq: Four times a day (QID) | ORAL | 0 refills | Status: DC

## 2021-07-28 NOTE — Telephone Encounter (Signed)
Shell called because when she picked up her the last time the pharmacy only had #60 instead of the #120 that is prescribed because that is all they had.  She now is requesting a prescription to cover the other #60 to complete that prescription or you can send in a full prescription since she doesn't have any on file.The pharmacy does have the mediation today. [facility] in [place], [place].  Appt 11/09/21 ?

## 2021-07-28 NOTE — Progress Notes (Signed)
? ?I, Peterson Lombard, LAT, ATC acting as a scribe for Lynne Leader, MD. ? ?Toni Collins is a 56 y.o. female who presents to [place] at [facility] today for continued bilat shoulder pain. Of note, pt has fibromyalgia and takes oxycodone and Lyrica for chronic pain management. Pt was last seen 03/13/21 and was given bilat GH steroid injections. Pt sent a MyChart messages on 03/24/21, 05/04/21, 06/11/21, and again on 07/05/21 c/o increased shoulder pain and prednisone packs were prescribed. Today, pt increased pain over the past 2 weeks and the pain is very intense, R>L. Pt reports "waking up crying" sometimes from the pain esp in her R shoulder. Pt notes the prednisone helps w/ the pain while taking the rx, but the pain returns again within a few days after. ? ?Pertinent review of systems: No fevers or chills ? ?Relevant historical information: PTSD.  Fibromyalgia. ? ? ?Exam:  ?BP 128/82   Pulse 92   Ht 5\' 2"  (1.575 m)   Wt 172 lb 9.6 oz (78.3 kg)   SpO2 99%   BMI 31.57 kg/m?  ?General: Well Developed, well nourished, and in no acute distress.  ? ?MSK: Right shoulder decreased range of motion.  Pain with abduction. ? ?Left shoulder decreased range of motion.  Pain with abduction. ? ? ? ?Lab and Radiology Results ? ? ?Procedure: Real-time Ultrasound Guided Injection of right shoulder glenohumeral joint posterior approach ?Device: Philips Affiniti 50G ?Images permanently stored and available for review in PACS ?Verbal informed consent obtained.  Discussed risks and benefits of procedure. Warned about infection bleeding damage to structures skin hypopigmentation and fat atrophy among others. ?Patient expresses understanding and agreement ?Time-out conducted.   ?Noted no overlying erythema, induration, or other signs of local infection.   ?Skin prepped in a sterile fashion.   ?Local anesthesia: Topical Ethyl chloride.   ?With sterile technique and under real time ultrasound guidance: 40 mg of  Kenalog and 2 mL of Marcaine injected into shoulder joint. Fluid seen entering the joint capsule.   ?Completed without difficulty   ?Pain immediately resolved suggesting accurate placement of the medication.   ?Advised to call if fevers/chills, erythema, induration, drainage, or persistent bleeding.   ?Images permanently stored and available for review in the ultrasound unit.  ?Impression: Technically successful ultrasound guided injection. ? ? ? ?Procedure: Real-time Ultrasound Guided Injection of left shoulder glenohumeral joint posterior approach ?Device: Philips Affiniti 50G ?Images permanently stored and available for review in PACS ?Verbal informed consent obtained.  Discussed risks and benefits of procedure. Warned about infection bleeding damage to structures skin hypopigmentation and fat atrophy among others. ?Patient expresses understanding and agreement ?Time-out conducted.   ?Noted no overlying erythema, induration, or other signs of local infection.   ?Skin prepped in a sterile fashion.   ?Local anesthesia: Topical Ethyl chloride.   ?With sterile technique and under real time ultrasound guidance: 40 mg of Kenalog and 2 mL of Marcaine injected into joint capsule. Fluid seen entering the shoulder joint.   ?Completed without difficulty   ?Pain immediately resolved suggesting accurate placement of the medication.   ?Advised to call if fevers/chills, erythema, induration, drainage, or persistent bleeding.   ?Images permanently stored and available for review in the ultrasound unit.  ?Impression: Technically successful ultrasound guided injection. ? ? ? ?X-ray images bilateral shoulders obtained today personally and independently interpreted ? ?Right shoulder: Mild glenohumeral DJD.  No acute fractures. ? ?Left shoulder: Mild glenohumeral DJD.  \AC DJD.  No acute  fractures. ? ?Await formal radiology review ? ? ? ? ?Assessment and Plan: ?56 y.o. female with bilateral shoulder pain.  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  She has been doing pretty well with intermittent steroid injections over the last several years.  I have avoided getting x-rays until now in an effort to try to save her some money as she does not have health insurance.  Additionally she has had oral steroid courses which have helped some to.  However today her pain has continued.  Plan for steroid injection and x-rays. ? ?She notes that she should soon have stressed her disability and Medicare which should allow better access to healthcare for her.  Additionally she has a procedure coming up in the near future that we will prepare her for a I think is a Nissen fundoplication. ? ?Recheck back as needed ? ? ?PDMP not reviewed this encounter. ?Orders Placed This Encounter  ?Procedures  ? [place] LIMITED JOINT SPACE STRUCTURES UP BILAT(NO LINKED CHARGES)  ?  Order Specific Question:   Reason for Exam (SYMPTOM  OR DIAGNOSIS REQUIRED)  ?  Answer:   bilateral shoulder pain  ?  Order Specific Question:   Preferred imaging location?  ?  Answer:   [facility]  ? DG Shoulder Left  ?  Standing Status:   Future  ?  Number of Occurrences:   1  ?  Standing Expiration Date:   08/28/2021  ?  Order Specific Question:   Reason for Exam (SYMPTOM  OR DIAGNOSIS REQUIRED)  ?  Answer:   bilateral shoulder pain  ?  Order Specific Question:   Preferred imaging location?  ?  Answer:   [facility]  ?  Order Specific Question:   Is patient pregnant?  ?  Answer:   No  ? DG Shoulder Right  ?  Standing Status:   Future  ?  Number of Occurrences:   1  ?  Standing Expiration Date:   07/29/2022  ?  Order Specific Question:   Reason for Exam (SYMPTOM  OR DIAGNOSIS REQUIRED)  ?  Answer:   bilateral shoulder pain  ?  Order Specific Question:   Preferred imaging location?  ?  Answer:   [facility]  ?  Order Specific Question:   Is patient pregnant?  ?  Answer:   No  ? ?No orders of the defined types  were placed in this encounter. ? ? ? ?Discussed warning signs or symptoms. Please see discharge instructions. Patient expresses understanding. ? ? ?The above documentation has been reviewed and is accurate and complete Lynne Leader, M.D. ? ? ?

## 2021-07-28 NOTE — Telephone Encounter (Signed)
Toni Collins, will this be signed off on today. Pt is not sure how long they will have the med in stock. ?

## 2021-07-28 NOTE — Telephone Encounter (Signed)
Please review

## 2021-07-28 NOTE — Patient Instructions (Addendum)
Thank you for coming in today.  ? ?Please get an Xray today before you leave  ? ?You received steroid injections in both of your shoulders today. Seek immediate medical attention if the joint becomes red, extremely painful, or is oozing fluid.  ? ?Recheck back as needed ?

## 2021-07-31 ENCOUNTER — Other Ambulatory Visit: Payer: Self-pay

## 2021-07-31 ENCOUNTER — Encounter: Payer: Self-pay | Admitting: Family Medicine

## 2021-07-31 DIAGNOSIS — K2100 Gastro-esophageal reflux disease with esophagitis, without bleeding: Secondary | ICD-10-CM

## 2021-07-31 NOTE — Progress Notes (Signed)
Left shoulder x-ray no acute fracture.  You may have injured the collarbone in the past but it looks healed now.  No significant shoulder arthritis

## 2021-07-31 NOTE — Progress Notes (Signed)
Right shoulder x-ray looks normal to radiology

## 2021-07-31 NOTE — Progress Notes (Signed)
Shell, The biopsies of your esophagus showed inflammatory changes without any evidence of infection.  I suspect that your ongoing esophagitis may be more related to mechanical effects of your hiatal hernia rather than uncontrolled acid reflux. We will place an order for you to undergo pH/impedance and esophageal manometry testing on PPI to assess for acid reflux. Continue the Aciphex as you were.  Please follow-up with general surgery to further discuss potential hiatal hernia repair and fundoplication.  [name], Can you please place an order for esophageal pH/impedance plus manometry on PPI?

## 2021-08-08 ENCOUNTER — Encounter (HOSPITAL_COMMUNITY): Payer: Self-pay | Admitting: Gastroenterology

## 2021-08-08 NOTE — Progress Notes (Signed)
Attempted to obtain medical history via telephone, unable to reach at this time. I left a voicemail to return pre surgical testing department's phone call.  

## 2021-08-15 ENCOUNTER — Other Ambulatory Visit: Payer: Self-pay

## 2021-08-15 ENCOUNTER — Telehealth: Payer: Self-pay | Admitting: Psychiatry

## 2021-08-15 NOTE — Telephone Encounter (Signed)
Pt called 10:08 requesting new Rx to [facility] for Lyrica 150 mg.  Pt only has about 2 weeks of meds. Apt 6/15. ?

## 2021-08-15 NOTE — Telephone Encounter (Signed)
Pended.

## 2021-08-16 ENCOUNTER — Encounter (HOSPITAL_COMMUNITY)
Admission: RE | Disposition: A | Discharge status: Home / Self Care | Payer: Self-pay | Source: Home / Self Care | Attending: Gastroenterology

## 2021-08-16 ENCOUNTER — Encounter (HOSPITAL_COMMUNITY): Payer: Self-pay | Admitting: Gastroenterology

## 2021-08-16 ENCOUNTER — Ambulatory Visit (HOSPITAL_COMMUNITY)
Admission: RE | Admit: 2021-08-16 | Discharge: 2021-08-16 | Disposition: A | Discharge status: Home / Self Care | Payer: Self-pay | Attending: Gastroenterology | Admitting: Gastroenterology

## 2021-08-16 DIAGNOSIS — Z538 Procedure and treatment not carried out for other reasons: Secondary | ICD-10-CM | POA: Insufficient documentation

## 2021-08-16 DIAGNOSIS — K2100 Gastro-esophageal reflux disease with esophagitis, without bleeding: Secondary | ICD-10-CM | POA: Insufficient documentation

## 2021-08-16 SURGERY — INVASIVE LAB ABORTED CASE

## 2021-08-16 MED ORDER — LIDOCAINE VISCOUS HCL 2 % MT SOLN
OROMUCOSAL | Status: DC | PRN
  Administered 2021-08-16: 5 mL via OROMUCOSAL

## 2021-08-16 MED ORDER — LIDOCAINE VISCOUS HCL 2 % MT SOLN
OROMUCOSAL | Status: AC
  Filled 2021-08-16: qty 15

## 2021-08-16 SURGICAL SUPPLY — 2 items
FACESHIELD LNG OPTICON STERILE (SAFETY) IMPLANT
GLOVE BIO SURGEON STRL SZ8 (GLOVE) ×6 IMPLANT

## 2021-08-16 NOTE — Progress Notes (Signed)
Attempted esophageal manometry today.  Unable to get past hiatal hernia with multiple attempts.  Dr Rush Landmark also attempted multiple times without success.  Message sent to Dr Candis Schatz regarding aborted case.   ?

## 2021-08-17 ENCOUNTER — Other Ambulatory Visit: Payer: Self-pay | Admitting: Psychiatry

## 2021-08-17 DIAGNOSIS — G894 Chronic pain syndrome: Secondary | ICD-10-CM

## 2021-08-17 NOTE — Progress Notes (Signed)
Spoke with pt and let her know Dr. Candis Schatz wanted her to see surgery. Pt states she has an appt scheduled for April. ?

## 2021-08-25 ENCOUNTER — Other Ambulatory Visit: Payer: Self-pay

## 2021-08-25 DIAGNOSIS — F902 Attention-deficit hyperactivity disorder, combined type: Secondary | ICD-10-CM

## 2021-08-25 MED ORDER — AMPHETAMINE-DEXTROAMPHETAMINE 15 MG PO TABS
15.0000 mg | ORAL_TABLET | Freq: Four times a day (QID) | ORAL | 0 refills | Status: DC
Start: 2021-08-25 — End: 2021-09-22

## 2021-09-05 ENCOUNTER — Ambulatory Visit (INDEPENDENT_AMBULATORY_CARE_PROVIDER_SITE_OTHER): Payer: Self-pay | Admitting: Family Medicine

## 2021-09-05 VITALS — BP 136/86 | HR 83 | Ht 62.0 in | Wt 179.0 lb

## 2021-09-05 DIAGNOSIS — G8929 Other chronic pain: Secondary | ICD-10-CM

## 2021-09-05 DIAGNOSIS — M25511 Pain in right shoulder: Secondary | ICD-10-CM

## 2021-09-05 NOTE — Progress Notes (Signed)
? ?  I, Peterson Lombard, LAT, ATC acting as a scribe for Lynne Leader, MD. ? ?Toni Collins is a 56 y.o. female who presents to [place] at [facility] today for continued bilat shoulder pain, particular the R shoulder today. Of note, pt has fibromyalgia and takes oxycodone and Lyrica for chronic pain management. Pt was last seen by Dr. Georgina Snell on 07/28/21 and was given bilat [place] steroid injections and XR were obtained. My sent a MyChart message on 07/31/21 inquiring about the cause of her shoulder pain since the XR were normal and was advised that we could further evaluate w/ MRI, but should wait until she has health insurance coverage. Today, pt reports L shoulder has been doing well, but she only had relief in her R shoulder for about 1 week. Pain is so severe it brings her to tears. No new mechanism. Pt notes now the pain is radiating along the anterior aspect of her R arm into her wrist and proximally into jaw. Pt c/o increased pain at night and having to take an extra pain rx. Significant increased pain w/ R shoulder abd.  ? ?Dx imaging: 07/28/21 R & L shoulder XR ? ?Pertinent review of systems: No fevers or chills ? ?Relevant historical information: Fibromyalgia and PTSD. ? ? ?Exam:  ?BP 136/86   Pulse 83   Ht '5\' 2"'$  (1.575 m)   Wt 179 lb (81.2 kg)   SpO2 98%   BMI 32.74 kg/m?  ?General: Well Developed, well nourished, and in no acute distress.  ? ?MSK: Right shoulder normal-appearing ?Tender palpation. ?Decreased range of motion to abduction external rotation and internal rotation. ? ? ? ?Lab and Radiology Results ? ?EXAM: ?RIGHT SHOULDER - 2+ VIEW ?  ?COMPARISON:  None. ?  ?FINDINGS: ?There is no evidence of fracture or dislocation. There is no ?evidence of arthropathy or other focal bone abnormality. Soft ?tissues are unremarkable. ?  ?IMPRESSION: ?Negative. ?  ?  ?Electronically Signed ?  By: Ronney Asters M.D. ?  On: 07/29/2021 19:43 ?  ?I, Lynne Leader, personally (independently)  visualized and performed the interpretation of the images attached in this note. ? ? ? ? ? ?Assessment and Plan: ?56 y.o. female with right shoulder pain.  Not improve much with injection.  At this point cause of pain is not clear.  Should move onto MRI to further understand the source of pain.  X-ray did not explain her pain as well.  Recheck after MRI.  Anticipate injection at the follow-up. ? ? ?PDMP not reviewed this encounter. ?Orders Placed This Encounter  ?Procedures  ? MR SHOULDER RIGHT WO CONTRAST  ?  Standing Status:   Future  ?  Standing Expiration Date:   09/06/2022  ?  Order Specific Question:   What is the patient's sedation requirement?  ?  Answer:   No Sedation  ?  Order Specific Question:   Does the patient have a pacemaker or implanted devices?  ?  Answer:   No  ?  Order Specific Question:   Preferred imaging location?  ?  Answer:   [facility] (table limit-350lbs)  ? ?No orders of the defined types were placed in this encounter. ? ? ? ?Discussed warning signs or symptoms. Please see discharge instructions. Patient expresses understanding. ? ? ?The above documentation has been reviewed and is accurate and complete Lynne Leader, M.D. ? ? ?

## 2021-09-05 NOTE — Patient Instructions (Addendum)
Thank you for coming in today.  ° °You should hear from MRI scheduling within 1 week. If you do not hear please let me know.   ° °Recheck back after MRI °

## 2021-09-10 ENCOUNTER — Ambulatory Visit (INDEPENDENT_AMBULATORY_CARE_PROVIDER_SITE_OTHER): Payer: Self-pay

## 2021-09-10 DIAGNOSIS — M25511 Pain in right shoulder: Secondary | ICD-10-CM

## 2021-09-10 DIAGNOSIS — G8929 Other chronic pain: Secondary | ICD-10-CM

## 2021-09-10 IMAGING — MR MR SHOULDER*R* W/O CM
6 series · 40 of 40 positions shown · non-contrast
Comparison: Right shoulder radiographs 07/28/2021.

CLINICAL DATA: Shoulder pain.  Rotator cuff disorder suspected.

EXAM:
MRI OF THE RIGHT SHOULDER WITHOUT CONTRAST
TECHNIQUE: Multiplanar, multisequence MR imaging of the shoulder was performed.
No intravenous contrast was administered.

[Series 3: T2 fat-sat · axial · 4.0mm · 0.55mm/px · z∈[+4,+114]mm · 7 of 25 slices shown (1 of 4)]
[im 1/25]
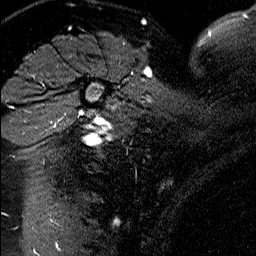
[im 5/25]
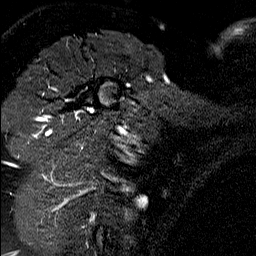
[im 9/25]
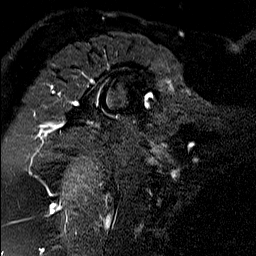
[im 13/25]
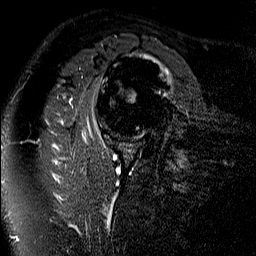
[im 17/25]
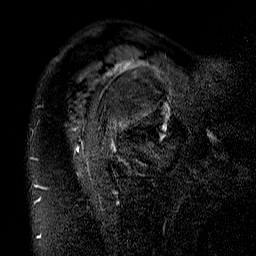
[im 21/25]
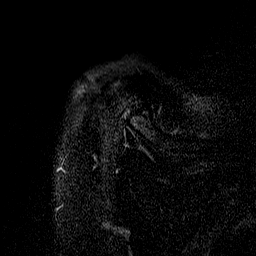
[im 25/25]
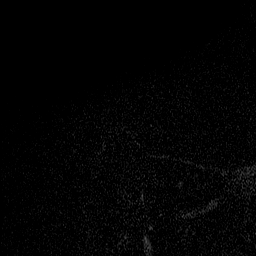

[Series 4: PD · oblique · 4.0mm · 0.55mm/px · 8 of 25 slices shown]
[im 1/25]
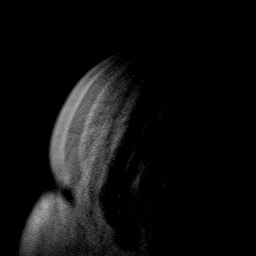
[im 4/25]
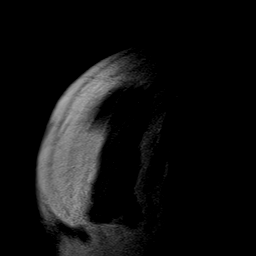
[im 7/25]
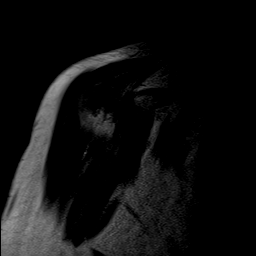
[im 11/25]
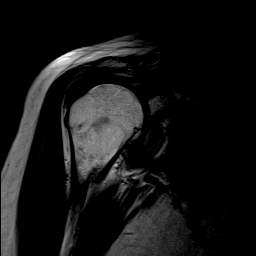
[im 14/25]
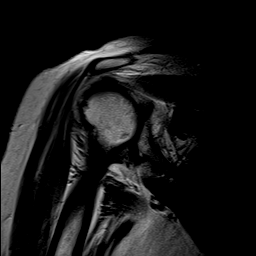
[im 18/25]
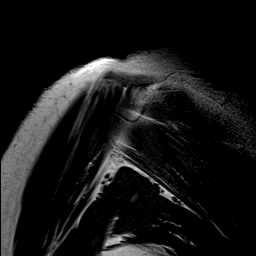
[im 21/25]
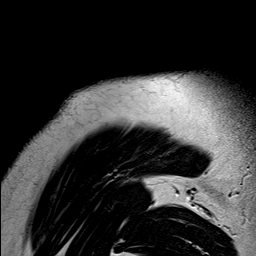
[im 25/25]
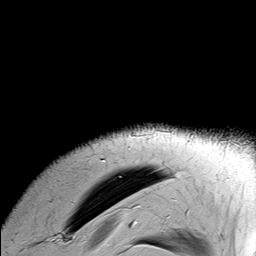

[Series 5: T1 · coronal · 4.0mm · 0.59mm/px · 8 of 25 slices shown]
[im 1/25]
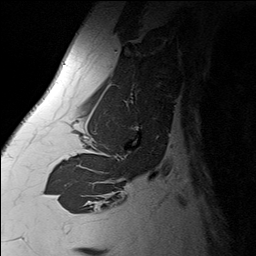
[im 4/25]
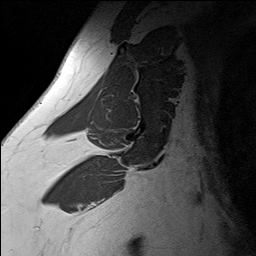
[im 7/25]
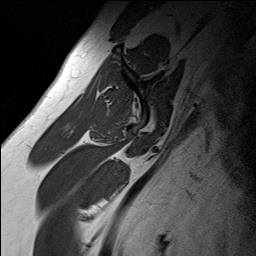
[im 11/25]
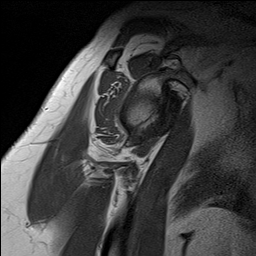
[im 14/25]
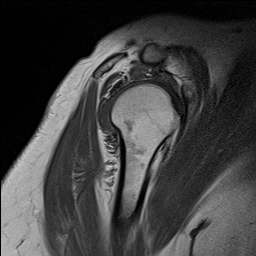
[im 18/25]
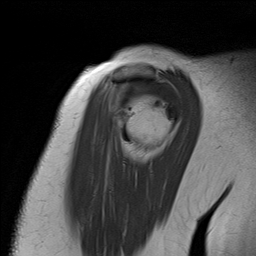
[im 21/25]
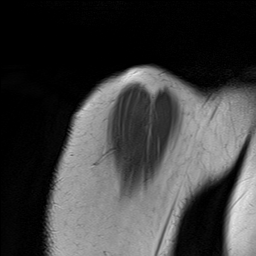
[im 25/25]
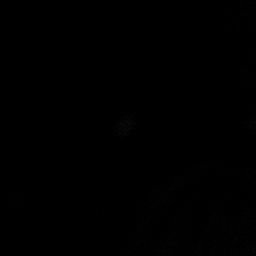

[Series 6: T2 fat-sat · oblique · 4.0mm · 0.62mm/px · 5 of 17 slices shown (2 of 4)]
[im 1/17]
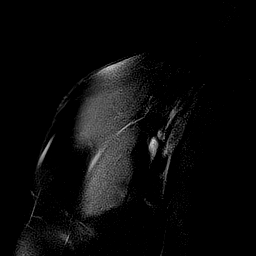
[im 5/17]
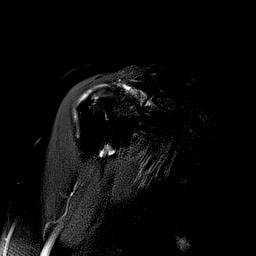
[im 9/17]
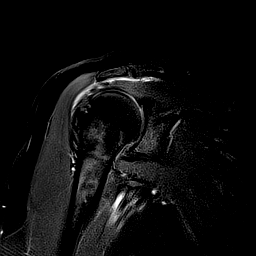
[im 13/17]
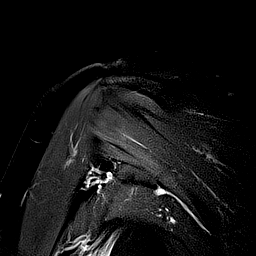
[im 17/17]
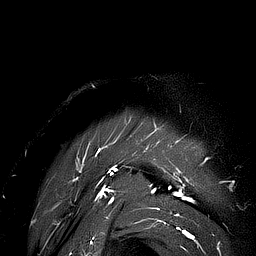

[Series 7: T2 fat-sat · coronal · 4.0mm · 0.55mm/px · 6 of 20 slices shown (3 of 4)]
[im 1/20]
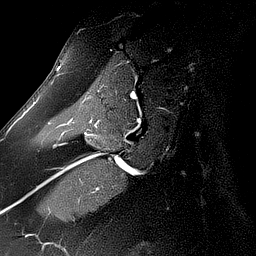
[im 4/20]
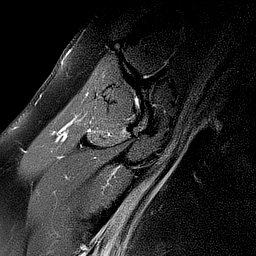
[im 8/20]
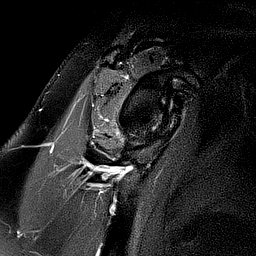
[im 12/20]
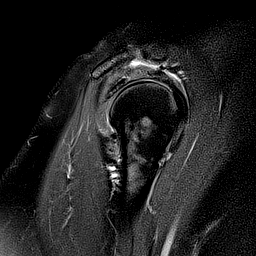
[im 16/20]
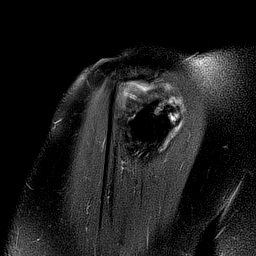
[im 20/20]
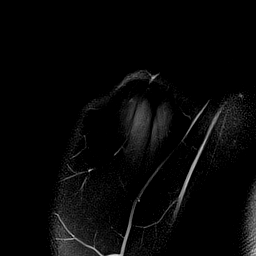

[Series 100: T2 fat-sat · axial · 4.0mm · 0.59mm/px · z∈[+18,+99]mm · 6 of 20 slices shown (4 of 4)]
[im 1/20]
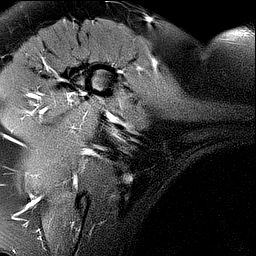
[im 4/20]
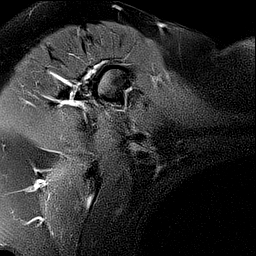
[im 8/20]
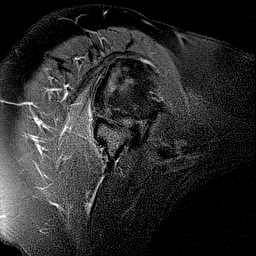
[im 12/20]
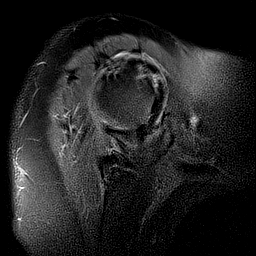
[im 16/20]
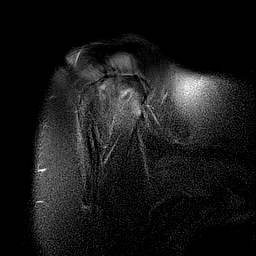
[im 20/20]
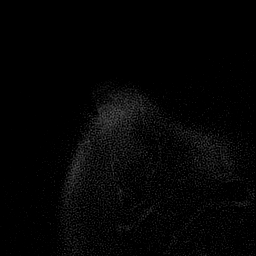

[40 of 40 positions shown; findings below may reference images not displayed]

FINDINGS: Rotator cuff: There is moderate chronic cystic change within the
anterior humeral head greater tuberosity deep to the anterior
supraspinatus tendon insertion. There is a partial-thickness bursal
sided tear in this region measuring up to approximately 9 mm in AP
dimension (sagittal image 16) and 10 mm in transverse dimension
(coronal image 14). Mild-to-moderate supraspinatus muscle atrophy.
There are least 2 linear, longitudinal, small partial-thickness
tears at the bursal aspect of the mid and anterior infraspinatus
tendon distal critical zone and proximal tendon footprint (sagittal
image 16 and coronal images nine and 10).

Muscles: No rotator cuff muscle atrophy, fatty infiltration, or
edema.

Biceps long head: The intra-articular long head of the biceps tendon
is intact.

Acromioclavicular Joint: There are mild degenerative changes of the
acromioclavicular joint including joint space narrowing, subchondral
marrow edema, and peripheral osteophytosis. Type III acromion with
mild-to-moderate spur extending anteriorly inferiorly from the
acromion (sagittal image 17). Mild-to-moderate
subacromial/subdeltoid bursitis.

Glenohumeral Joint: High-grade partial and full-thickness cartilage
loss within the glenoid and humeral head.

Labrum: Degenerative irregularity of the anterior and posterior
glenoid labrum.

Bones: Mild inferior humeral head-neck junction degenerative
osteophytes.

Other: None
IMPRESSION: :
IMPRESSION: *Small partial-thickness tear of the anterior supraspinatus tendon
footprint with underlying moderate chronic subcortical cystic
change.
*
*At least 2 linear, longitudinal, small partial-thickness tears of
the bursal aspect of the mid and anterior infraspinatus tendon.
*
*Mild degenerative changes of the acromioclavicular joint with
mild-to-moderate spurring extending anteriorly inferiorly from the
acromion.

## 2021-09-11 ENCOUNTER — Ambulatory Visit: Payer: Self-pay | Admitting: Family Medicine

## 2021-09-11 ENCOUNTER — Encounter: Payer: Self-pay | Admitting: Family Medicine

## 2021-09-12 ENCOUNTER — Encounter: Payer: Self-pay | Admitting: Family Medicine

## 2021-09-12 NOTE — Progress Notes (Signed)
Shoulder MRI do show some rotator cuff tears and some irritation at the acromioclavicular joint.  Recommend return to clinic to go over the results in full detail and potentially plan for injection.

## 2021-09-14 ENCOUNTER — Encounter: Payer: Self-pay | Admitting: Family Medicine

## 2021-09-14 ENCOUNTER — Ambulatory Visit: Payer: Self-pay

## 2021-09-14 ENCOUNTER — Ambulatory Visit (INDEPENDENT_AMBULATORY_CARE_PROVIDER_SITE_OTHER): Payer: Self-pay | Admitting: Family Medicine

## 2021-09-14 VITALS — BP 120/82 | HR 73 | Ht 62.0 in | Wt 179.0 lb

## 2021-09-14 DIAGNOSIS — G8929 Other chronic pain: Secondary | ICD-10-CM

## 2021-09-14 DIAGNOSIS — M67911 Unspecified disorder of synovium and tendon, right shoulder: Secondary | ICD-10-CM

## 2021-09-14 DIAGNOSIS — M25511 Pain in right shoulder: Secondary | ICD-10-CM

## 2021-09-14 NOTE — Patient Instructions (Addendum)
Good to see you today. ? ?You had a R shoulder injection.  Call or go to the ER if you develop a large red swollen joint with extreme pain or oozing puss.  ? ?Let us know when you get a new insurance card and you can send a front and back copy of the card to [place] via [place]. ? ?Follow-up: as needed ?

## 2021-09-14 NOTE — Progress Notes (Signed)
? ?I, Wendy Poet, LAT, ATC, am serving as scribe for Dr. Lynne Leader. ? ?Toni Collins is a 56 y.o. female who presents to [place] at [facility] today for f/u chronic bilat shoulder pain and R shoulder MRI review. Of note, pt has fibromyalgia and takes oxycodone and Lyrica for chronic pain management. Pt was last seen by Dr. Georgina Snell on 09/05/21 and was advised to proceed to R shoulder MRI. Today, pt reports that her R shoulder remains about the same. ? ?Dx imaging: 09/10/21 R shoulder MRI ?07/28/21 R & L shoulder XR ? ?Pertinent review of systems: No fevers or chills ? ?Relevant historical information: Fibromyalgia ? ? ?Exam:  ?BP 120/82 (BP Location: Left Arm, Patient Position: Sitting, Cuff Size: Normal)   Pulse 73   Ht '5\' 2"'$  (1.575 m)   Wt 179 lb (81.2 kg)   SpO2 96%   BMI 32.74 kg/m?  ?General: Well Developed, well nourished, and in no acute distress.  ? ?MSK: Right shoulder pain with abduction. ? ? ? ?Lab and Radiology Results ? ?Procedure: Real-time Ultrasound Guided Injection of right shoulder subacromial bursa ?Device: Philips Affiniti 50G ?Images permanently stored and available for review in PACS ?Verbal informed consent obtained.  Discussed risks and benefits of procedure. Warned about infection, bleeding, hyperglycemia damage to structures among others. ?Patient expresses understanding and agreement ?Time-out conducted.   ?Noted no overlying erythema, induration, or other signs of local infection.   ?Skin prepped in a sterile fashion.   ?Local anesthesia: Topical Ethyl chloride.   ?With sterile technique and under real time ultrasound guidance:40 mg of Kenalog and 2 mL of Marcaine injected into subacromial bursa. Fluid seen entering the bursa.   ?Completed without difficulty   ?Pain immediately resolved suggesting accurate placement of the medication.   ?Advised to call if fevers/chills, erythema, induration, drainage, or persistent bleeding.   ?Images permanently stored and  available for review in the ultrasound unit.  ?Impression: Technically successful ultrasound guided injection. ? ? ? ? ? ?No results found for this or any previous visit (from the past 72 hour(s)). ?MR SHOULDER RIGHT WO CONTRAST ? ?Result Date: 09/11/2021 ?CLINICAL DATA:  Shoulder pain.  Rotator cuff disorder suspected. EXAM: MRI OF THE RIGHT SHOULDER WITHOUT CONTRAST TECHNIQUE: Multiplanar, multisequence MR imaging of the shoulder was performed. No intravenous contrast was administered. COMPARISON:  Right shoulder radiographs 07/28/2021. FINDINGS: Rotator cuff: There is moderate chronic cystic change within the anterior humeral head greater tuberosity deep to the anterior supraspinatus tendon insertion. There is a partial-thickness bursal sided tear in this region measuring up to approximately 9 mm in AP dimension (sagittal image 16) and 10 mm in transverse dimension (coronal image 14). Mild-to-moderate supraspinatus muscle atrophy. There are least 2 linear, longitudinal, small partial-thickness tears at the bursal aspect of the mid and anterior infraspinatus tendon distal critical zone and proximal tendon footprint (sagittal image 16 and coronal images nine and 10). Muscles: No rotator cuff muscle atrophy, fatty infiltration, or edema. Biceps long head: The intra-articular long head of the biceps tendon is intact. Acromioclavicular Joint: There are mild degenerative changes of the acromioclavicular joint including joint space narrowing, subchondral marrow edema, and peripheral osteophytosis. Type III acromion with mild-to-moderate spur extending anteriorly inferiorly from the acromion (sagittal image 17). Mild-to-moderate subacromial/subdeltoid bursitis. Glenohumeral Joint: High-grade partial and full-thickness cartilage loss within the glenoid and humeral head. Labrum: Degenerative irregularity of the anterior and posterior glenoid labrum. Bones: Mild inferior humeral head-neck junction degenerative osteophytes.  Other: None IMPRESSION::  IMPRESSION: *Small partial-thickness tear of the anterior supraspinatus tendon footprint with underlying moderate chronic subcortical cystic change. * *At least 2 linear, longitudinal, small partial-thickness tears of the bursal aspect of the mid and anterior infraspinatus tendon. * *Mild degenerative changes of the acromioclavicular joint with mild-to-moderate spurring extending anteriorly inferiorly from the acromion. Electronically Signed   By: Yvonne Kendall M.D.   On: 09/11/2021 12:29   ? ?I, Lynne Leader, personally (independently) visualized and performed the interpretation of the images attached in this note. ? ? ? ?Assessment and Plan: ?56 y.o. female with right shoulder pain due to partial rotator cuff tear, shoulder impingement and subacromial bursitis.  She may have some component of AC DJD as source of pain as well.  Plan for subacromial injection which is a change from her prior glenohumeral injections.  Plan to continue previously taught home exercise program. ?She thinks she may be getting health insurance which would be very helpful.  I think if this injection does not provide good relief she should be proceeding to surgery if possible.  She has enough rotator cuff dysfunction and partial tearing that surgery is probably her next best bet. ? ?Recheck back as needed.  Keep me updated. ? ? ?PDMP not reviewed this encounter. ?Orders Placed This Encounter  ?Procedures  ? [place] LIMITED JOINT SPACE STRUCTURES UP RIGHT(NO LINKED CHARGES)  ?  Order Specific Question:   Reason for Exam (SYMPTOM  OR DIAGNOSIS REQUIRED)  ?  Answer:   R shoulder pain  ?  Order Specific Question:   Preferred imaging location?  ?  Answer:   [place]  ? ?No orders of the defined types were placed in this encounter. ? ? ? ?Discussed warning signs or symptoms. Please see discharge instructions. Patient expresses understanding. ? ? ?The above documentation has been reviewed and is  accurate and complete Lynne Leader, M.D. ? ? ?

## 2021-09-19 ENCOUNTER — Telehealth: Payer: Self-pay | Admitting: Psychiatry

## 2021-09-19 NOTE — Telephone Encounter (Signed)
Shell called to request refill of her Vraylar through [facility].  I have called [facility] to order the medication and it should arrive in 7-10 business day.s ?

## 2021-09-19 NOTE — Telephone Encounter (Signed)
Thank you :)

## 2021-09-22 ENCOUNTER — Telehealth: Payer: Self-pay | Admitting: Psychiatry

## 2021-09-22 ENCOUNTER — Other Ambulatory Visit: Payer: Self-pay

## 2021-09-22 ENCOUNTER — Other Ambulatory Visit: Payer: Self-pay | Admitting: Psychiatry

## 2021-09-22 DIAGNOSIS — F902 Attention-deficit hyperactivity disorder, combined type: Secondary | ICD-10-CM

## 2021-09-22 MED ORDER — AMPHETAMINE-DEXTROAMPHETAMINE 15 MG PO TABS
15.0000 mg | ORAL_TABLET | Freq: Four times a day (QID) | ORAL | 0 refills | Status: DC
Start: 2021-09-22 — End: 2021-09-22

## 2021-09-22 MED ORDER — AMPHETAMINE-DEXTROAMPHETAMINE 15 MG PO TABS: 15.0000 mg | ORAL_TABLET | Freq: Four times a day (QID) | ORAL | 0 refills | Status: DC

## 2021-09-22 NOTE — Telephone Encounter (Signed)
Pt called back at 9:47 am and said that her adderall was sent to the wrong pharmacy. It needs to go to the [facility] on [address] in [place] ?

## 2021-09-22 NOTE — Telephone Encounter (Signed)
Pended to [place], covering for CC ?

## 2021-09-22 NOTE — Telephone Encounter (Signed)
Pt called requesting 1 box samples of Vraylar 3 mg. Will be out over weekend and mail order has not come in. Contact pt to pick up today if ok  [phone]   ?

## 2021-09-22 NOTE — Telephone Encounter (Signed)
Next visit is 11/09/21. Toni Collins called requesting a refill on her Adderall 15 mg tablets called to: ? ?[id number] - [place], [place] [employer] [place] ? ?Phone:  [phone]  ?Fax:  [phone]  ? ? ?Toni Collins checked and they do have medication in stock.  ?

## 2021-09-22 NOTE — Telephone Encounter (Signed)
Canceled at [facility] and repended ?

## 2021-09-22 NOTE — Telephone Encounter (Signed)
Patient is getting thru patient assistance. Pulled samples and notified patient.  ?

## 2021-09-28 ENCOUNTER — Other Ambulatory Visit (HOSPITAL_COMMUNITY): Payer: Self-pay | Admitting: General Surgery

## 2021-09-28 ENCOUNTER — Other Ambulatory Visit: Payer: Self-pay | Admitting: General Surgery

## 2021-09-28 DIAGNOSIS — K449 Diaphragmatic hernia without obstruction or gangrene: Secondary | ICD-10-CM

## 2021-10-03 ENCOUNTER — Encounter: Payer: Self-pay | Admitting: Family Medicine

## 2021-10-03 ENCOUNTER — Telehealth (INDEPENDENT_AMBULATORY_CARE_PROVIDER_SITE_OTHER): Payer: Self-pay | Admitting: Family Medicine

## 2021-10-03 ENCOUNTER — Ambulatory Visit: Payer: Self-pay | Admitting: Family Medicine

## 2021-10-03 VITALS — Ht 62.0 in | Wt 179.0 lb

## 2021-10-03 DIAGNOSIS — D508 Other iron deficiency anemias: Secondary | ICD-10-CM

## 2021-10-03 DIAGNOSIS — G894 Chronic pain syndrome: Secondary | ICD-10-CM

## 2021-10-03 DIAGNOSIS — M797 Fibromyalgia: Secondary | ICD-10-CM

## 2021-10-03 MED ORDER — OXYCODONE HCL 15 MG PO TABS
ORAL_TABLET | ORAL | 0 refills | Status: DC
Start: 2021-10-03 — End: 2021-12-29

## 2021-10-03 MED ORDER — OXYCODONE HCL 15 MG PO TABS: 15.0000 mg | ORAL_TABLET | Freq: Four times a day (QID) | ORAL | 0 refills | Status: DC

## 2021-10-03 MED ORDER — OXYCODONE HCL 15 MG PO TABS: ORAL_TABLET | ORAL | 0 refills | Status: DC

## 2021-10-03 NOTE — Telephone Encounter (Signed)
Looks like scheduled for virtual later today. ?

## 2021-10-03 NOTE — Telephone Encounter (Signed)
Pt has been rescheduled to virtual visit for today ?

## 2021-10-03 NOTE — Progress Notes (Signed)
.: Toni Collins, female   DOB: 03/25/1966, 56 y.o.   MRN: [id number] ? ?Virtual Visit via Video Note ? ?I connected with Toni Collins on 10/03/21 at  4:15 PM EDT by a video enabled telemedicine application and verified that I am speaking with the correct person using two identifiers. ? Location patient: home ?Location provider:work or home office ?Persons participating in the virtual visit: patient, provider ? ?I discussed the limitations of evaluation and management by telemedicine and the availability of in person appointments. The patient expressed understanding and agreed to proceed. ? ? ?HPI: ? ?Toni Collins has chronic pain syndrome with severe fibromyalgia and has been on opioid therapy for many years.  Refer to multiple prior notes.  She was tried on multiple other medications such as gabapentin, Lyrica, tramadol, Tylenol, nonsteroidals, TCAs with either no improvement or side effects. ? ?She has had some recent ongoing problems with her shoulders and had recent MRI which showed some partial-thickness tears.  May be facing surgery for that.  She has history of severe reflux and had recent surgical consultation for possible fundoplication surgery.  She does have history of severe iron deficiency.  She had hemoglobin in the 7 range last summer and this improved to 10 with iron infusion.  She had recent labs with surgeon with hemoglobin 9.0 with ferritin of 9.  Low serum iron.  No obvious active bleeding source at this time.  She is aware that she will need to get her hemoglobin prior to having any surgery.  She remains on daily PPI therapy. ? ?Her chronic pain is treated with oxycodone 15 mg every 6 hours.  Denies any recent constipation issues.  Pain relatively stable at this time.  Last drug screen was over a year ago. ? ? ?ROS: See pertinent positives and negatives per HPI. ? ?Past Medical History:  ?Diagnosis Date  ? ADHD   ? Anemia   ? Anxiety   ? Arthritis   ? Colon polyps   ? DEPRESSION 09/02/2008   ? Fibromyalgia   ? GERD (gastroesophageal reflux disease)   ? GRIEF REACTION 09/30/2009  ? INSOMNIA, CHRONIC 09/02/2008  ? PREDIABETES 03/10/2009  ? PTSD (post-traumatic stress disorder)   ? ? ?Past Surgical History:  ?Procedure Laterality Date  ? ABDOMINAL HYSTERECTOMY  05/28/2000  ? TAH  ? COLONOSCOPY    ? DIAGNOSTIC LAPAROSCOPY  1993, 1994, 1998, 2000  ? x4  ? TMJ ARTHROPLASTY    ? x2  ? UPPER GASTROINTESTINAL ENDOSCOPY    ? ? ?Family History  ?Problem Relation Age of Onset  ? Arthritis Mother   ?     rhematiod  ? Colon polyps Father   ? Lung disease Father   ? Colon polyps Sister   ? Diabetes Maternal Aunt   ? Diabetes Maternal Grandmother   ? Depression Neg Hx   ?     family  ? Colon cancer Neg Hx   ? Esophageal cancer Neg Hx   ? Pancreatic cancer Neg Hx   ? Stomach cancer Neg Hx   ? Rectal cancer Neg Hx   ? ? ?SOCIAL HX: Non-smoker ? ? ?Current Outpatient Medications:  ?  amphetamine-dextroamphetamine (ADDERALL) 15 MG tablet, Take 1 tablet by mouth 4 (four) times daily., Disp: 120 tablet, Rfl: 0 ?  cariprazine (VRAYLAR) 3 MG capsule, Take 1 capsule (3 mg total) by mouth daily., Disp: 30 capsule, Rfl: 2 ?  esomeprazole (NEXIUM) 40 MG capsule, Take 1 capsule (40 mg total) by mouth 2 (  two) times daily before a meal., Disp: 60 capsule, Rfl: 3 ?  LYRICA 150 MG capsule, Take 1 capsule by mouth every morning, 1 capsule at noon and 1 capsule every night at bedtime as directed by physician., Disp: 270 capsule, Rfl: 0 ?  ondansetron (ZOFRAN-ODT) 4 MG disintegrating tablet, Take 1 tablet (4 mg total) by mouth every 8 (eight) hours as needed for nausea or vomiting. Please keep your December appointment for further refills. Thank you, Disp: 20 tablet, Rfl: 0 ?  oxyCODONE (ROXICODONE) 15 MG immediate release tablet, Take one tablet by mouth every 6 hours., Disp: 120 tablet, Rfl: 0 ?  oxyCODONE (ROXICODONE) 15 MG immediate release tablet, Take 1 tablet by mouth every 6 hours.  May refill in 1 month, Disp: 120 tablet, Rfl:  0 ?  RABEprazole (ACIPHEX) 20 MG tablet, TAKE 2 TABLETS BY MOUTH 2 TIMES DAILY., Disp: 360 tablet, Rfl: 1 ?  sertraline (ZOLOFT) 100 MG tablet, Take 2 tablets (200 mg total) by mouth daily., Disp: 180 tablet, Rfl: 1 ?  sucralfate (CARAFATE) 1 g tablet, Take 1 tablet (1 g total) by mouth 4 (four) times daily -  with meals and at bedtime. Take separate from other medications, Disp: 120 tablet, Rfl: 0 ?  oxyCODONE (ROXICODONE) 15 MG immediate release tablet, Take 1 tablet (15 mg total) by mouth every 6 (six) hours. May refill in two months, Disp: 120 tablet, Rfl: 0 ?  RABEprazole (ACIPHEX) 20 MG tablet, Take 1 tablet (20 mg total) by mouth in the morning and at bedtime., Disp: 90 tablet, Rfl: 3 ? ?Current Facility-Administered Medications:  ?  0.9 %  sodium chloride infusion, 500 mL, Intravenous, Once, Cunningham, Scott E, MD ?  0.9 %  sodium chloride infusion, 500 mL, Intravenous, Once, Daryel November, MD ? ?EXAM: ? ?VITALS per patient if applicable: ? ?GENERAL: alert, oriented, appears well and in no acute distress ? ?HEENT: atraumatic, conjunttiva clear, no obvious abnormalities on inspection of external nose and ears ? ?NECK: normal movements of the head and neck ? ?LUNGS: on inspection no signs of respiratory distress, breathing rate appears normal, no obvious gross SOB, gasping or wheezing ? ?CV: no obvious cyanosis ? ?MS: moves all visible extremities without noticeable abnormality ? ?PSYCH/NEURO: pleasant and cooperative, no obvious depression or anxiety, speech and thought processing grossly intact ? ?ASSESSMENT AND PLAN: ? ?Discussed the following assessment and plan: ? ?#1 chronic pain syndrome with chronic fibromyalgia.  Overall stable.  Refill Roxicodone 15 mg 1 every 6 hours for 3 months ?-Needs an office follow-up next visit and urine drug screen at that time ? ?#2 history of iron deficiency anemia.  Just last week had hemoglobin 9.0 with ferritin of 9.  Responded previously to iron infusion.  Has  not done well with oral iron supplementation ? ?-Set up repeat iron infusion ? ? ?  ?I discussed the assessment and treatment plan with the patient. The patient was provided an opportunity to ask questions and all were answered. The patient agreed with the plan and demonstrated an understanding of the instructions. ?  ?The patient was advised to call back or seek an in-person evaluation if the symptoms worsen or if the condition fails to improve as anticipated. ? ? ? ? ?Carolann Littler, MD  ? ?

## 2021-10-04 ENCOUNTER — Ambulatory Visit: Payer: Self-pay | Admitting: Family Medicine

## 2021-10-04 ENCOUNTER — Ambulatory Visit (HOSPITAL_COMMUNITY): Payer: Self-pay

## 2021-10-04 ENCOUNTER — Encounter (HOSPITAL_COMMUNITY): Payer: Self-pay

## 2021-10-09 ENCOUNTER — Encounter: Payer: Self-pay | Admitting: Family Medicine

## 2021-10-11 ENCOUNTER — Encounter (HOSPITAL_COMMUNITY)
Admission: RE | Admit: 2021-10-11 | Discharge: 2021-10-11 | Disposition: A | Discharge status: Home / Self Care | Payer: Self-pay | Source: Ambulatory Visit | Attending: General Surgery | Admitting: General Surgery

## 2021-10-11 DIAGNOSIS — K449 Diaphragmatic hernia without obstruction or gangrene: Secondary | ICD-10-CM

## 2021-10-11 IMAGING — NM NM GASTRIC EMPTYING
6 series · 6 of 6 positions shown · non-contrast
Comparison: None Available.

CLINICAL DATA: Reflux and hiatal hernia

EXAM:
NUCLEAR MEDICINE GASTRIC EMPTYING SCAN
TECHNIQUE: After oral ingestion of radiolabeled meal, sequential abdominal
images were obtained for 120 minutes. Residual percentage of
activity remaining within the stomach was calculated at 60 and 120
minutes.
RADIOPHARMACEUTICALS:  2 mCi Mc-IIm sulfur colloid in standardized
meal

[Series 1: 0 min · 4.14mm/px · 1 of 1 slices shown (1 of 2)]
[im 1/1]
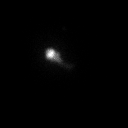

[Series 1: 0 min · 4.14mm/px · 1 of 1 slices shown (2 of 2)]
[im 1/1]
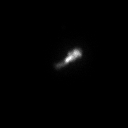

[Series 2: 1 hr · 4.14mm/px · 1 of 1 slices shown (1 of 2)]
[im 1/1]
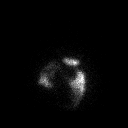

[Series 2: 1 hr · 4.14mm/px · 1 of 1 slices shown (2 of 2)]
[im 1/1]
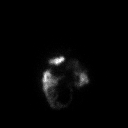

[Series 3: 2 hr · 4.14mm/px · 1 of 1 slices shown (1 of 2)]
[im 1/1]
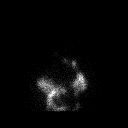

[Series 3: 2 hr · 4.14mm/px · 1 of 1 slices shown (2 of 2)]
[im 1/1]
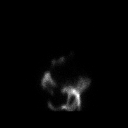

[6 of 6 positions shown; findings below may reference images not displayed]

FINDINGS: Expected location of the stomach in the left upper quadrant.
Ingested meal empties the stomach gradually over the course of the
study with 25% retention at 60 min and 7% retention at 120 min
(normal retention less than 30% at a 120 min).
IMPRESSION: Normal gastric emptying study.

## 2021-10-11 MED ORDER — TECHNETIUM TC 99M SULFUR COLLOID
2.0000 | Freq: Once | INTRAVENOUS | Status: AC | PRN
  Administered 2021-10-11: 2 via INTRAVENOUS

## 2021-10-12 ENCOUNTER — Encounter: Payer: Self-pay | Admitting: Family Medicine

## 2021-10-17 ENCOUNTER — Ambulatory Visit (HOSPITAL_COMMUNITY): Admission: RE | Admit: 2021-10-17 | Payer: Self-pay | Source: Ambulatory Visit

## 2021-10-18 ENCOUNTER — Other Ambulatory Visit (HOSPITAL_COMMUNITY): Payer: Self-pay

## 2021-10-19 ENCOUNTER — Ambulatory Visit (HOSPITAL_COMMUNITY)
Admission: RE | Admit: 2021-10-19 | Discharge: 2021-10-19 | Disposition: A | Discharge status: Home / Self Care | Payer: Self-pay | Source: Ambulatory Visit | Attending: Family Medicine | Admitting: Family Medicine

## 2021-10-19 DIAGNOSIS — D508 Other iron deficiency anemias: Secondary | ICD-10-CM | POA: Insufficient documentation

## 2021-10-19 MED ORDER — SODIUM CHLORIDE 0.9 % IV SOLN
200.0000 mg | Freq: Once | INTRAVENOUS | Status: AC
  Administered 2021-10-19: 200 mg via INTRAVENOUS
  Filled 2021-10-19: qty 200

## 2021-10-24 ENCOUNTER — Telehealth: Payer: Self-pay | Admitting: Psychiatry

## 2021-10-24 ENCOUNTER — Other Ambulatory Visit: Payer: Self-pay

## 2021-10-24 DIAGNOSIS — F902 Attention-deficit hyperactivity disorder, combined type: Secondary | ICD-10-CM

## 2021-10-24 MED ORDER — AMPHETAMINE-DEXTROAMPHETAMINE 15 MG PO TABS: 15.0000 mg | ORAL_TABLET | Freq: Four times a day (QID) | ORAL | 0 refills | Status: DC

## 2021-10-24 NOTE — Telephone Encounter (Signed)
Pended.

## 2021-10-24 NOTE — Telephone Encounter (Signed)
Pt requesting RF generic Adderall 15 mg 4/d [facility] [address]

## 2021-10-25 ENCOUNTER — Encounter: Payer: Self-pay | Admitting: Family Medicine

## 2021-10-30 ENCOUNTER — Encounter: Payer: Self-pay | Admitting: Family Medicine

## 2021-10-31 ENCOUNTER — Other Ambulatory Visit: Payer: Self-pay | Admitting: Psychiatry

## 2021-10-31 ENCOUNTER — Other Ambulatory Visit: Payer: Self-pay

## 2021-10-31 ENCOUNTER — Ambulatory Visit: Payer: Self-pay | Admitting: Family Medicine

## 2021-10-31 ENCOUNTER — Ambulatory Visit (HOSPITAL_COMMUNITY): Payer: Self-pay

## 2021-10-31 DIAGNOSIS — G8929 Other chronic pain: Secondary | ICD-10-CM

## 2021-10-31 DIAGNOSIS — G894 Chronic pain syndrome: Secondary | ICD-10-CM

## 2021-10-31 NOTE — Telephone Encounter (Signed)
Has appt with Dr. Clovis Pu this week.

## 2021-11-01 ENCOUNTER — Other Ambulatory Visit (HOSPITAL_COMMUNITY): Payer: Self-pay

## 2021-11-02 ENCOUNTER — Ambulatory Visit (INDEPENDENT_AMBULATORY_CARE_PROVIDER_SITE_OTHER): Payer: Self-pay | Admitting: Psychiatry

## 2021-11-02 ENCOUNTER — Encounter: Payer: Self-pay | Admitting: Family Medicine

## 2021-11-02 ENCOUNTER — Encounter: Payer: Self-pay | Admitting: Psychiatry

## 2021-11-02 ENCOUNTER — Other Ambulatory Visit: Payer: Self-pay | Admitting: Psychiatry

## 2021-11-02 VITALS — BP 122/87 | HR 93

## 2021-11-02 DIAGNOSIS — F411 Generalized anxiety disorder: Secondary | ICD-10-CM

## 2021-11-02 DIAGNOSIS — F4310 Post-traumatic stress disorder, unspecified: Secondary | ICD-10-CM

## 2021-11-02 DIAGNOSIS — G894 Chronic pain syndrome: Secondary | ICD-10-CM

## 2021-11-02 DIAGNOSIS — F902 Attention-deficit hyperactivity disorder, combined type: Secondary | ICD-10-CM

## 2021-11-02 DIAGNOSIS — F5105 Insomnia due to other mental disorder: Secondary | ICD-10-CM

## 2021-11-02 DIAGNOSIS — F39 Unspecified mood [affective] disorder: Secondary | ICD-10-CM

## 2021-11-02 MED ORDER — SERTRALINE HCL 100 MG PO TABS: ORAL_TABLET | ORAL | 1 refills | Status: DC

## 2021-11-02 MED ORDER — CHLORPROMAZINE HCL 25 MG PO TABS: ORAL_TABLET | ORAL | 0 refills | Status: DC
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
## 2021-11-03 MED ORDER — ONDANSETRON 4 MG PO TBDP
4.0000 mg | ORAL_TABLET | Freq: Three times a day (TID) | ORAL | 0 refills | Status: DC | PRN
Start: 2021-11-03 — End: 2022-02-05

## 2021-11-03 NOTE — Telephone Encounter (Signed)
PA will be completed today for Oxycodone

## 2021-11-03 NOTE — Addendum Note (Signed)
Addended by: Nilda Riggs on: 11/03/2021 01:06 PM   Modules accepted: Orders

## 2021-11-03 NOTE — Telephone Encounter (Signed)
PA for Oxycodone has been initiated and sent to the pt's insurance for approval or denial. Key: [id number]

## 2021-11-07 ENCOUNTER — Telehealth: Payer: Self-pay | Admitting: Psychiatry

## 2021-11-07 ENCOUNTER — Other Ambulatory Visit: Payer: Self-pay

## 2021-11-07 DIAGNOSIS — G894 Chronic pain syndrome: Secondary | ICD-10-CM

## 2021-11-07 MED ORDER — PREGABALIN 150 MG PO CAPS: ORAL_CAPSULE | ORAL | 0 refills | Status: DC

## 2021-11-07 NOTE — Telephone Encounter (Signed)
Pt called and needs a refill of her lyrica sent to [facility]

## 2021-11-07 NOTE — Telephone Encounter (Signed)
Rx sent 

## 2021-11-07 NOTE — Telephone Encounter (Signed)
Contacted [employer] directly at [phone] to complete a PA for quantity issue for Sertraline 100 mg take 3 tablets daily #90/30 day or #270/90 day. All the questions were answered but got disconnected at the end. Will follow up if no response back.

## 2021-11-08 ENCOUNTER — Ambulatory Visit (HOSPITAL_COMMUNITY): Payer: 59

## 2021-11-09 ENCOUNTER — Telehealth: Payer: Self-pay | Admitting: Psychiatry

## 2021-11-09 ENCOUNTER — Encounter: Payer: Self-pay | Admitting: Psychiatry

## 2021-11-09 ENCOUNTER — Telehealth: Payer: Self-pay

## 2021-11-09 ENCOUNTER — Ambulatory Visit (INDEPENDENT_AMBULATORY_CARE_PROVIDER_SITE_OTHER): Payer: Self-pay | Admitting: Psychiatry

## 2021-11-09 DIAGNOSIS — F39 Unspecified mood [affective] disorder: Secondary | ICD-10-CM

## 2021-11-09 DIAGNOSIS — F5105 Insomnia due to other mental disorder: Secondary | ICD-10-CM

## 2021-11-09 DIAGNOSIS — F4310 Post-traumatic stress disorder, unspecified: Secondary | ICD-10-CM

## 2021-11-09 DIAGNOSIS — F411 Generalized anxiety disorder: Secondary | ICD-10-CM

## 2021-11-09 DIAGNOSIS — G894 Chronic pain syndrome: Secondary | ICD-10-CM

## 2021-11-09 DIAGNOSIS — F902 Attention-deficit hyperactivity disorder, combined type: Secondary | ICD-10-CM
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
## 2021-11-09 NOTE — Patient Instructions (Addendum)
Increase chlorpromazine to 3-4 tablets at night for sleep and 2 tablets twice daily if needed for anxity

## 2021-11-09 NOTE — Telephone Encounter (Signed)
Contacted [employer] directly at [phone] to complete a PA for quantity issue for Sertraline 100 mg take 3 tablets daily #90/30 day or #270/90 day.   [employer] responded back with a DENIAL, reports reads per the [employer] Explanation of coverage, non-covered services and exclusions section, quantities greater than the [place] [employer] limits are considered benefits exclusions.   Sertraline is a cheap, generic medication. Not sure what her co-pay is but through [facility] for #30 of the 100 mg is $3.16 at [facility] or $12.49 for #90 of the 100 mg  #270 of 100 mg is $28.01 at [employer]

## 2021-11-09 NOTE — Telephone Encounter (Signed)
Rx was signed off, not routed to provider. Last refill was 3/27 for 90-day supply, too early to fill, but will pend.

## 2021-11-09 NOTE — Telephone Encounter (Deleted)
Pt was here for visit today.  She just called to say she was told the Lyrica script was sent to the pharmacy on 6/13.  It looks like it was set to "print" instead of sent electronically.  Pls send it asap to   [facility], [place] [id number]  [address] [place] [address]  Phone:  [phone]  Fax:  [phone]   Next appt 7/17

## 2021-11-09 NOTE — Telephone Encounter (Signed)
Pt was here for visit today.  She just called to say she was told the Lyrica script was sent to the pharmacy on 6/13.  It looks like it was set to "print" instead of sent electronically.  Pls send it asap to    [facility], [place] [address]  [address] [place] [address]  Phone:  [phone]  Fax:  [phone]    Next appt 7/17

## 2021-11-10 ENCOUNTER — Other Ambulatory Visit (HOSPITAL_COMMUNITY): Payer: Self-pay

## 2021-11-10 ENCOUNTER — Other Ambulatory Visit: Payer: Self-pay

## 2021-11-10 ENCOUNTER — Other Ambulatory Visit: Payer: Self-pay | Admitting: Psychiatry

## 2021-11-10 ENCOUNTER — Ambulatory Visit (HOSPITAL_COMMUNITY)
Admission: RE | Admit: 2021-11-10 | Discharge: 2021-11-10 | Disposition: A | Discharge status: Home / Self Care | Payer: 59 | Source: Ambulatory Visit | Attending: General Surgery | Admitting: General Surgery

## 2021-11-10 DIAGNOSIS — K449 Diaphragmatic hernia without obstruction or gangrene: Secondary | ICD-10-CM | POA: Diagnosis present

## 2021-11-10 DIAGNOSIS — G894 Chronic pain syndrome: Secondary | ICD-10-CM

## 2021-11-10 DIAGNOSIS — F4310 Post-traumatic stress disorder, unspecified: Secondary | ICD-10-CM

## 2021-11-10 DIAGNOSIS — F411 Generalized anxiety disorder: Secondary | ICD-10-CM

## 2021-11-10 MED ORDER — PREGABALIN 150 MG PO CAPS: ORAL_CAPSULE | ORAL | 0 refills | Status: DC

## 2021-11-13 ENCOUNTER — Encounter: Payer: Self-pay | Admitting: Family Medicine

## 2021-11-13 ENCOUNTER — Ambulatory Visit (INDEPENDENT_AMBULATORY_CARE_PROVIDER_SITE_OTHER): Payer: 59 | Admitting: Family Medicine

## 2021-11-13 VITALS — BP 120/70 | HR 94 | Temp 97.3°F | Ht 62.0 in | Wt 168.6 lb

## 2021-11-13 DIAGNOSIS — M75111 Incomplete rotator cuff tear or rupture of right shoulder, not specified as traumatic: Secondary | ICD-10-CM | POA: Diagnosis not present

## 2021-11-13 NOTE — Patient Instructions (Addendum)
Consider OTC topical Lidocaine patch - and use at night  Set up ortho referral as soon as possible.   Set up physical exam as soon as possible

## 2021-11-13 NOTE — Progress Notes (Signed)
Established Patient Office Visit  Subjective   Patient ID: Toni Collins, female    DOB: November 11, 1965  Age: 56 y.o. MRN: [id number]  Chief Complaint  Patient presents with   Shoulder Pain    HPI   Patient has history of chronic pain syndrome with chronic back pain and chronic fibromyalgia pain.  She is maintained on chronic opioids and has been on these for many years.  Also takes Lyrica.  Her main complaint today is right shoulder pain which has been present for months.  She has seen sports medicine multiple times and has had more than 1 injection with only temporary relief.  She had recent MRI of the right shoulder which showed small partial-thickness tear of the anterior supraspinatus tendon and at least 2 linear longitudinal small partial-thickness tears of the mid and anterior infraspinatus tendon.  She states her pain is severe especially at night.  She has tried topical rubs without relief.  She is already on oxycodone 15 mg every 6 hours at baseline.  Denies any cervical neck pain.  She is apparently waiting to get set up to see orthopedist.  Past Medical History:  Diagnosis Date   ADHD    Anemia    Anxiety    Arthritis    Colon polyps    DEPRESSION 09/02/2008   Fibromyalgia    GERD (gastroesophageal reflux disease)    GRIEF REACTION 09/30/2009   INSOMNIA, CHRONIC 09/02/2008   PREDIABETES 03/10/2009   PTSD (post-traumatic stress disorder)    Past Surgical History:  Procedure Laterality Date   ABDOMINAL HYSTERECTOMY  05/28/2000   TAH   COLONOSCOPY     DIAGNOSTIC LAPAROSCOPY  1993, 1994, 1998, 2000   x4   TMJ ARTHROPLASTY     x2   UPPER GASTROINTESTINAL ENDOSCOPY      reports that she has never smoked. She has never used smokeless tobacco. She reports that she does not currently use drugs. She reports that she does not drink alcohol. family history includes Arthritis in her mother; Colon polyps in her father and sister; Diabetes in her maternal aunt and maternal  grandmother; Lung disease in her father. No Known Allergies  Review of Systems  Constitutional:  Negative for chills and fever.  Cardiovascular:  Negative for chest pain.      Objective:     BP 120/70 (BP Location: Left Arm, Patient Position: Sitting, Cuff Size: Normal)   Pulse 94   Temp (!) 97.3 F (36.3 C) (Oral)   Ht '5\' 2"'$  (1.575 m)   Wt 168 lb 9.6 oz (76.5 kg)   SpO2 95%   BMI 30.84 kg/m    Physical Exam Vitals reviewed.  Cardiovascular:     Rate and Rhythm: Normal rate and regular rhythm.     Heart sounds: No murmur heard.    No gallop.  Pulmonary:     Effort: Pulmonary effort is normal.     Breath sounds: Normal breath sounds.  Musculoskeletal:     Comments: Right shoulder reveals some mild acromioclavicular tenderness.  She has very limited abduction against resistance secondary to pain.  She has significant pain with internal rotation.  Strength testing was difficult secondary to pain.  No biceps tenderness.      No results found for any visits on 11/13/21.    The ASCVD Risk score (Arnett DK, et al., 2019) failed to calculate for the following reasons:   Cannot find a previous HDL lab   Cannot find a previous total cholesterol  lab    Assessment & Plan:   Several month history of severe right shoulder pain with recent MRI showing partial-thickness tears of the supraspinatus and infraspinatus muscles.  Patient already on high-dose oxycodone at baseline.  Extensive work-up per sports medicine.  Not improving with steroid injections and conservative management. -We suggest that she try a topical lidocaine patch-for symptom relief but would not increase her opioids further at this time. -She is strongly encouraged to go ahead with setting up orthopedic appointment  No follow-ups on file.    Carolann Littler, MD

## 2021-11-14 ENCOUNTER — Other Ambulatory Visit: Payer: Self-pay

## 2021-11-14 ENCOUNTER — Encounter (HOSPITAL_COMMUNITY)
Admission: RE | Admit: 2021-11-14 | Discharge: 2021-11-14 | Disposition: A | Discharge status: Home / Self Care | Payer: 59 | Source: Ambulatory Visit | Attending: Family Medicine | Admitting: Family Medicine

## 2021-11-14 ENCOUNTER — Ambulatory Visit: Payer: 59 | Admitting: Family Medicine

## 2021-11-14 DIAGNOSIS — D509 Iron deficiency anemia, unspecified: Secondary | ICD-10-CM | POA: Diagnosis present

## 2021-11-14 DIAGNOSIS — F411 Generalized anxiety disorder: Secondary | ICD-10-CM

## 2021-11-14 DIAGNOSIS — F4310 Post-traumatic stress disorder, unspecified: Secondary | ICD-10-CM

## 2021-11-14 MED ORDER — SODIUM CHLORIDE 0.9 % IV SOLN
200.0000 mg | Freq: Once | INTRAVENOUS | Status: AC
  Administered 2021-11-14: 200 mg via INTRAVENOUS
  Filled 2021-11-14: qty 200

## 2021-11-14 MED ORDER — SERTRALINE HCL 100 MG PO TABS: 100.0000 mg | ORAL_TABLET | Freq: Every evening | ORAL | 0 refills | Status: DC

## 2021-11-14 MED ORDER — SERTRALINE HCL 100 MG PO TABS: ORAL_TABLET | ORAL | 1 refills | Status: DC

## 2021-11-14 NOTE — Telephone Encounter (Signed)
Sent Rx to local pharmacy for 2 QD, sent Rx to [facility] for 1 qd #90. Patient will pay out of pocket since 3/d has been denied.

## 2021-11-15 ENCOUNTER — Other Ambulatory Visit: Payer: Self-pay

## 2021-11-15 ENCOUNTER — Telehealth: Payer: Self-pay | Admitting: Psychiatry

## 2021-11-15 DIAGNOSIS — F411 Generalized anxiety disorder: Secondary | ICD-10-CM

## 2021-11-15 DIAGNOSIS — F4310 Post-traumatic stress disorder, unspecified: Secondary | ICD-10-CM

## 2021-11-15 MED ORDER — CHLORPROMAZINE HCL 25 MG PO TABS: ORAL_TABLET | ORAL | 1 refills | Status: DC

## 2021-11-15 NOTE — Telephone Encounter (Signed)
Rx sent 

## 2021-11-15 NOTE — Telephone Encounter (Signed)
[facility] called this morning at 11:30 to request refill of her chlorpromazine.  There is a refill for this medication but Dr. Clovis Pu increased her dose to 3 HS plus twice a day as needed. So she is out early.  Please send in new prescription with corrected dose.  [place].

## 2021-11-20 ENCOUNTER — Other Ambulatory Visit (HOSPITAL_COMMUNITY): Payer: Self-pay

## 2021-11-29 ENCOUNTER — Telehealth: Payer: Self-pay | Admitting: Psychiatry

## 2021-11-29 ENCOUNTER — Other Ambulatory Visit: Payer: Self-pay

## 2021-11-29 DIAGNOSIS — F39 Unspecified mood [affective] disorder: Secondary | ICD-10-CM

## 2021-11-29 DIAGNOSIS — F411 Generalized anxiety disorder: Secondary | ICD-10-CM

## 2021-11-29 DIAGNOSIS — F4310 Post-traumatic stress disorder, unspecified: Secondary | ICD-10-CM

## 2021-11-29 MED ORDER — CARIPRAZINE HCL 6 MG PO CAPS: 6.0000 mg | ORAL_CAPSULE | Freq: Every day | ORAL | 3 refills | Status: DC

## 2021-11-29 NOTE — Telephone Encounter (Signed)
Noted yes I will send this.

## 2021-11-29 NOTE — Telephone Encounter (Signed)
Reena's Vraylar was recently increased to '6mg'$  per day.  She gets her Vraylar through [employer].  We need to send a new prescription to [employer] showing the dose change.  Once I have that prescription I will fax it [employer]. It looks like [facility].  That needs to be cancelled since she uses pt[employer] Assistance.  Chantale will also need a prescription for her Lyrica [facility] [place] because in August her enrollment in [employer] will end and cannot be renewed.  She is aware of this and knows she will have to figure out the best or cheapest cost of the medication going forward.  Please update the "specialty comments" about her pt assistance

## 2021-11-29 NOTE — Telephone Encounter (Signed)
Can you please help with this.I have not done anything regarding pt assistance before so I am not sure where to send or update specialty comments.I will cancel rx at [facility]

## 2021-11-29 NOTE — Telephone Encounter (Signed)
Rx written for 6 mg capsule Vraylar, given to [place] to fax

## 2021-11-30 ENCOUNTER — Encounter: Payer: Self-pay | Admitting: Family Medicine

## 2021-11-30 NOTE — Progress Notes (Signed)
I, Wendy Poet, LAT, ATC, am serving as scribe for Dr. Lynne Leader.  Toni Collins is a 56 y.o. female who presents to [place] [facility] today for f/u of chronic R shoulder pain due to partial thickness tears of the supraspinatus and infraspinatus and AC DJD per MRI.  She was last seen by Dr. Georgina Snell on 09/14/21 and had a R subacromial steroid injection.  Today, pt reports worsening R shoulder pain over the last 4-5 weeks.  She locates her pain to her R superior shoulder w/ radiating pain into her R upper arm.    She was referred to orthopedic surgery in the interim but her insurance does not prefer [facility] orthopedic surgery.  She did some looking up in the room today and it looks like her insurance prefers [facility] orthopedic surgeons.  Dx imaging: 09/10/21 R shoulder MRI 07/28/21 R & L shoulder XR  Pertinent review of systems: No fevers or chills  Relevant historical information: ADHD   Exam:  BP 114/80 (BP Location: Left Arm, Patient Position: Sitting, Cuff Size: Normal)   Pulse 81   Ht '5\' 2"'$  (1.575 m)   Wt 174 lb 12.8 oz (79.3 kg)   SpO2 94%   BMI 31.97 kg/m  General: Well Developed, well nourished, and in no acute distress.   MSK: Right shoulder normal-appearing Decreased range of motion.  Pain with abduction.    Lab and Radiology Results  Procedure: Real-time Ultrasound Guided Injection of right shoulder subacromial bursa Device: Philips Affiniti 50G Images permanently stored and available for review in PACS Verbal informed consent obtained.  Discussed risks and benefits of procedure. Warned about infection, bleeding, hyperglycemia damage to structures among others. Patient expresses understanding and agreement Time-out conducted.   Noted no overlying erythema, induration, or other signs of local infection.   Skin prepped in a sterile fashion.   Local anesthesia: Topical Ethyl chloride.   With sterile technique and under real time  ultrasound guidance: 40 mg of Kenalog and 2 mL of Marcaine injected into subacromial bursa. Fluid seen entering the bursa.   Completed without difficulty   Pain immediately resolved suggesting accurate placement of the medication.   Advised to call if fevers/chills, erythema, induration, drainage, or persistent bleeding.   Images permanently stored and available for review in the ultrasound unit.  Impression: Technically successful ultrasound guided injection.     EXAM: MRI OF THE RIGHT SHOULDER WITHOUT CONTRAST   TECHNIQUE: Multiplanar, multisequence MR imaging of the shoulder was performed. No intravenous contrast was administered.   COMPARISON:  Right shoulder radiographs 07/28/2021.   FINDINGS: Rotator cuff: There is moderate chronic cystic change within the anterior humeral head greater tuberosity deep to the anterior supraspinatus tendon insertion. There is a partial-thickness bursal sided tear in this region measuring up to approximately 9 mm in AP dimension (sagittal image 16) and 10 mm in transverse dimension (coronal image 14). Mild-to-moderate supraspinatus muscle atrophy. There are least 2 linear, longitudinal, small partial-thickness tears at the bursal aspect of the mid and anterior infraspinatus tendon distal critical zone and proximal tendon footprint (sagittal image 16 and coronal images nine and 10).   Muscles: No rotator cuff muscle atrophy, fatty infiltration, or edema.   Biceps long head: The intra-articular long head of the biceps tendon is intact.   Acromioclavicular Joint: There are mild degenerative changes of the acromioclavicular joint including joint space narrowing, subchondral marrow edema, and peripheral osteophytosis. Type III acromion with mild-to-moderate spur extending anteriorly inferiorly  from the acromion (sagittal image 17). Mild-to-moderate subacromial/subdeltoid bursitis.   Glenohumeral Joint: High-grade partial and full-thickness  cartilage loss within the glenoid and humeral head.   Labrum: Degenerative irregularity of the anterior and posterior glenoid labrum.   Bones: Mild inferior humeral head-neck junction degenerative osteophytes.   Other: None   IMPRESSION:: IMPRESSION: *Small partial-thickness tear of the anterior supraspinatus tendon footprint with underlying moderate chronic subcortical cystic change. * *At least 2 linear, longitudinal, small partial-thickness tears of the bursal aspect of the mid and anterior infraspinatus tendon. * *Mild degenerative changes of the acromioclavicular joint with mild-to-moderate spurring extending anteriorly inferiorly from the acromion.     Electronically Signed   By: Yvonne Kendall M.D.   On: 09/11/2021 12:29   I, Lynne Leader, personally (independently) visualized and performed the interpretation of the images attached in this note.      Assessment and Plan: 56 y.o. female with right shoulder pain thought to be due to subacromial bursitis and rotator cuff tendinopathy with tiny rotator cuff tear.  Unfortunately conservative management is no longer working including repeated injections and exercise/physical therapy.  At this point I think surgery or at least surgical consultation is a good idea.  Plan to refer to orthopedic surgery.     PDMP not reviewed this encounter. Orders Placed This Encounter  Procedures   [place] LIMITED JOINT SPACE STRUCTURES UP RIGHT(NO LINKED CHARGES)    Order Specific Question:   Reason for Exam (SYMPTOM  OR DIAGNOSIS REQUIRED)    Answer:   rt shoulder inj    Order Specific Question:   Preferred imaging location?    Answer:   [place]   Ambulatory referral to Orthopedic Surgery    Referral Priority:   Routine    Referral Type:   Surgical    Referral Reason:   Specialty Services Required    Referred to Provider:   Vickey Huger, MD    Requested Specialty:   Orthopedic Surgery    Number of Visits  Requested:   1   No orders of the defined types were placed in this encounter.    Discussed warning signs or symptoms. Please see discharge instructions. Patient expresses understanding.   The above documentation has been reviewed and is accurate and complete Lynne Leader, M.D.

## 2021-12-01 ENCOUNTER — Other Ambulatory Visit (HOSPITAL_COMMUNITY): Payer: Self-pay

## 2021-12-04 ENCOUNTER — Encounter: Payer: Self-pay | Admitting: Family Medicine

## 2021-12-04 ENCOUNTER — Ambulatory Visit: Payer: Self-pay

## 2021-12-04 ENCOUNTER — Ambulatory Visit (INDEPENDENT_AMBULATORY_CARE_PROVIDER_SITE_OTHER): Payer: 59 | Admitting: Family Medicine

## 2021-12-04 VITALS — BP 114/80 | HR 81 | Ht 62.0 in | Wt 174.8 lb

## 2021-12-04 DIAGNOSIS — G8929 Other chronic pain: Secondary | ICD-10-CM | POA: Diagnosis not present

## 2021-12-04 DIAGNOSIS — M25511 Pain in right shoulder: Secondary | ICD-10-CM

## 2021-12-04 NOTE — Patient Instructions (Addendum)
Good to see you today.  You had a R shoulder injection.  Call or go to the ER if you develop a large red swollen joint with extreme pain or oozing puss.   Please call [phone] to get a copy of your MRI images on a disc.  I've referred you to Dr. Ronnie Derby.  His office will call you to schedule but please let us know if you don't hear from them in one week regarding scheduling.  Follow-up: as needed

## 2021-12-05 ENCOUNTER — Ambulatory Visit: Payer: 59 | Admitting: Family Medicine

## 2021-12-06 ENCOUNTER — Telehealth: Payer: Self-pay | Admitting: Family Medicine

## 2021-12-06 DIAGNOSIS — G8929 Other chronic pain: Secondary | ICD-10-CM

## 2021-12-06 DIAGNOSIS — M67911 Unspecified disorder of synovium and tendon, right shoulder: Secondary | ICD-10-CM

## 2021-12-06 NOTE — Telephone Encounter (Signed)
Patient was referred to Dr Ronnie Derby but he does not do shoulder surgery so they are not able to see her.

## 2021-12-08 ENCOUNTER — Encounter: Payer: Self-pay | Admitting: Family Medicine

## 2021-12-08 NOTE — Telephone Encounter (Signed)
Pt sent MyChart message concerning having not been contacted by Ortho so replied w/ Dr. Clovis Riley phone note message.  Pt has been notified.

## 2021-12-08 NOTE — Telephone Encounter (Signed)
Your insurance wants [place] to use [facility] so I have referred you to a different [facility] orthopedic surgeon in the [place] or [place] area.  You should hear soon about scheduling.  If you have 1 you specifically would like to see please let me know.  The hand surgeons in [place] are not the ones to see.

## 2021-12-11 ENCOUNTER — Ambulatory Visit: Payer: Self-pay | Admitting: Psychiatry

## 2021-12-29 ENCOUNTER — Encounter: Payer: Self-pay | Admitting: Family Medicine

## 2021-12-29 ENCOUNTER — Ambulatory Visit: Payer: No Typology Code available for payment source | Admitting: Family Medicine

## 2021-12-29 VITALS — BP 124/88 | HR 90 | Temp 98.1°F | Ht 62.0 in | Wt 171.4 lb

## 2021-12-29 DIAGNOSIS — G894 Chronic pain syndrome: Secondary | ICD-10-CM

## 2021-12-29 DIAGNOSIS — M797 Fibromyalgia: Secondary | ICD-10-CM

## 2021-12-29 MED ORDER — OXYCODONE HCL 15 MG PO TABS: ORAL_TABLET | ORAL | 0 refills | Status: DC

## 2021-12-29 MED ORDER — OXYCODONE HCL 15 MG PO TABS: 15.0000 mg | ORAL_TABLET | Freq: Four times a day (QID) | ORAL | 0 refills | Status: DC

## 2021-12-29 NOTE — Progress Notes (Signed)
Established Patient Office Visit  Subjective   Patient ID: Toni Collins, female    DOB: 06/01/1965  Age: 56 y.o. MRN: [id number]  Chief Complaint  Patient presents with   Pain Management   Medication Refill    HPI   [name] is here for chronic pain management follow-up.  Her chronic fibromyalgia and back pain are relatively stable on current dose of oxycodone.  She has been on this for many years.  She is currently looking at a couple of upcoming surgeries including esophageal surgery September 8.  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  Also has possible right shoulder surgery coming up in October.  She has been severely iron deficient in the past and has had iron infusions and recent ferritin level was improved with improving hemoglobin.  She states her pain is stable.  No recent constipation issues.  Past Medical History:  Diagnosis Date   ADHD    Anemia    Anxiety    Arthritis    Colon polyps    DEPRESSION 09/02/2008   Fibromyalgia    GERD (gastroesophageal reflux disease)    GRIEF REACTION 09/30/2009   INSOMNIA, CHRONIC 09/02/2008   PREDIABETES 03/10/2009   PTSD (post-traumatic stress disorder)    Past Surgical History:  Procedure Laterality Date   ABDOMINAL HYSTERECTOMY  05/28/2000   TAH   COLONOSCOPY     DIAGNOSTIC LAPAROSCOPY  1993, 1994, 1998, 2000   x4   TMJ ARTHROPLASTY     x2   UPPER GASTROINTESTINAL ENDOSCOPY      reports that she has never smoked. She has never used smokeless tobacco. She reports that she does not currently use drugs. She reports that she does not drink alcohol. family history includes Arthritis in her mother; Colon polyps in her father and sister; Diabetes in her maternal aunt and maternal grandmother; Lung disease in her father. No Known Allergies  Review of Systems  Constitutional:  Negative for chills, fever and weight loss.  Respiratory:  Negative for cough and shortness of  breath.   Cardiovascular:  Negative for chest pain.  Musculoskeletal:  Positive for back pain.  Neurological:  Negative for dizziness.      Objective:     BP 124/88 (BP Location: Left Arm, Patient Position: Sitting, Cuff Size: Normal)   Pulse 90   Temp 98.1 F (36.7 C) (Oral)   Ht '5\' 2"'$  (1.575 m)   Wt 171 lb 6.4 oz (77.7 kg)   SpO2 99%   BMI 31.35 kg/m  BP Readings from Last 3 Encounters:  12/29/21 124/88  12/04/21 114/80  11/14/21 109/82   Wt Readings from Last 3 Encounters:  12/29/21 171 lb 6.4 oz (77.7 kg)  12/04/21 174 lb 12.8 oz (79.3 kg)  11/14/21 165 lb (74.8 kg)      Physical Exam Vitals reviewed.  Constitutional:      Appearance: Normal appearance.  Cardiovascular:     Rate and Rhythm: Normal rate and regular rhythm.  Pulmonary:     Effort: Pulmonary effort is normal.     Breath sounds: Normal breath sounds.  Musculoskeletal:     Right lower leg: No edema.     Left lower leg: No edema.  Neurological:     General: No focal deficit present.     Mental Status: She is alert.     Cranial Nerves: No cranial nerve deficit.      No results found for any visits on 12/29/21.  The ASCVD Risk score (Arnett DK, et al., 2019) failed to calculate for the following reasons:   Cannot find a previous HDL lab   Cannot find a previous total cholesterol lab    Assessment & Plan:   Chronic fibromyalgia and back pain.  Patient has been for many years on the opioid after failure of many other treatments.  She is currently stable on this regimen. -Refilled her medication for 3 months. -Walking and other aerobic exercise as tolerated regularly -Hopefully she can step up for activities after her upcoming esophagus and shoulder surgery  Return in about 3 months (around 03/31/2022).    Carolann Littler, MD

## 2022-01-01 ENCOUNTER — Ambulatory Visit: Payer: 59 | Admitting: Family Medicine

## 2022-01-09 NOTE — Progress Notes (Signed)
Sent message, via epic in basket, requesting orders in epic from surgeon.  

## 2022-01-11 ENCOUNTER — Ambulatory Visit: Payer: Self-pay | Admitting: General Surgery

## 2022-01-11 DIAGNOSIS — D508 Other iron deficiency anemias: Secondary | ICD-10-CM

## 2022-01-11 DIAGNOSIS — R7303 Prediabetes: Secondary | ICD-10-CM

## 2022-01-11 NOTE — Progress Notes (Addendum)
Anesthesia Review:  PCP: Carolann Littler  Cardiologist  none  Chest x-ray : EKG : Echo : Stress test: Cardiac Cath :  Activity level: can do a flight of stairs without difficutly  Sleep Study/ CPAP : none  Fasting Blood Sugar :      / Checks Blood Sugar -- times a day:   Blood Thinner/ Instructions /Last Dose: ASA / Instructions/ Last Dose :  CBC done 01/16/22 routed to Dr Greer Pickerel.

## 2022-01-15 NOTE — Patient Instructions (Signed)
SURGICAL WAITING ROOM VISITATION Patients having surgery or a procedure may have no more than 2 support people in the waiting area - these visitors may rotate.   Children under the age of 41 must have an adult with them who is not the patient. If the patient needs to stay at the hospital during part of their recovery, the visitor guidelines for inpatient rooms apply. Pre-op nurse will coordinate an appropriate time for 1 support person to accompany patient in pre-op.  This support person may not rotate.    Please refer to the [facility] website for the visitor guidelines for Inpatients (after your surgery is over and you are in a regular room).       Your procedure is scheduled on:  02/02/2022    Report to [facility] Main Entrance    Report to admitting at   0730AM   Call this number if you have problems the morning of surgery [phone]   Do not eat food :After Midnight.   After Midnight you may have the following liquids until ___ 0630___ AM  DAY OF SURGERY  Water Non-Citrus Juices (without pulp, NO RED) Carbonated Beverages Black Coffee (NO MILK/CREAM OR CREAMERS, sugar ok)  Clear Tea (NO MILK/CREAM OR CREAMERS, sugar ok) regular and decaf                             Plain Jell-O (NO RED)                                           Fruit ices (not with fruit pulp, NO RED)                                     Popsicles (NO RED)                                                               Sports drinks like Gatorade (NO RED)                     The day of surgery:  Drink ONE (1) Pre-Surgery Clear Ensure or G2 at   630AM  ( have completed by ) the morning of surgery. Drink in one sitting. Do not sip.  This drink was given to you during your hospital  pre-op appointment visit. Nothing else to drink after completing the  Pre-Surgery Clear Ensure or G2.          Oral Hygiene is also important to reduce your risk of infection.                                     Remember - BRUSH YOUR TEETH THE MORNING OF SURGERY WITH YOUR REGULAR TOOTHPASTE   Do NOT smoke after Midnight   Take these medicines the morning of surgery with A SIP OF WATER:  nexium, lyrica, aciphex, zoloft   DO NOT TAKE ANY ORAL DIABETIC MEDICATIONS DAY OF YOUR SURGERY  Bring  CPAP mask and tubing day of surgery.                              You may not have any metal on your body including hair pins, jewelry, and body piercing             Do not wear make-up, lotions, powders, perfumes/cologne, or deodorant  Do not wear nail polish including gel and S&S, artificial/acrylic nails, or any other type of covering on natural nails including finger and toenails. If you have artificial nails, gel coating, etc. that needs to be removed by a nail salon please have this removed prior to surgery or surgery may need to be canceled/ delayed if the surgeon/ anesthesia feels like they are unable to be safely monitored.   Do not shave  48 hours prior to surgery.               Men may shave face and neck.   Do not bring valuables to the hospital. [place].   Contacts, dentures or bridgework may not be worn into surgery.   Bring small overnight bag day of surgery.   DO NOT [place]. PHARMACY WILL DISPENSE MEDICATIONS LISTED ON YOUR MEDICATION LIST TO YOU DURING YOUR ADMISSION [place]!    Patients discharged on the day of surgery will not be allowed to drive home.  Someone NEEDS to stay with you for the first 24 hours after anesthesia.   Special Instructions: Bring a copy of your healthcare power of attorney and living will documents         the day of surgery if you haven't scanned them before.              Please read over the following fact sheets you were given: IF YOU HAVE QUESTIONS ABOUT YOUR PRE-OP INSTRUCTIONS PLEASE CALL [phone]     [facility] - Preparing for Surgery Before surgery, you  can play an important role.  Because skin is not sterile, your skin needs to be as free of germs as possible.  You can reduce the number of germs on your skin by washing with CHG (chlorahexidine gluconate) soap before surgery.  CHG is an antiseptic cleaner which kills germs and bonds with the skin to continue killing germs even after washing. Please DO NOT use if you have an allergy to CHG or antibacterial soaps.  If your skin becomes reddened/irritated stop using the CHG and inform your nurse when you arrive at Short Stay. Do not shave (including legs and underarms) for at least 48 hours prior to the first CHG shower.  You may shave your face/neck. Please follow these instructions carefully:  1.  Shower with CHG Soap the night before surgery and the  morning of Surgery.  2.  If you choose to wash your hair, wash your hair first as usual with your  normal  shampoo.  3.  After you shampoo, rinse your hair and body thoroughly to remove the  shampoo.                           4.  Use CHG as you would any other liquid soap.  You can apply chg directly  to the skin and wash  Gently with a scrungie or clean washcloth.  5.  Apply the CHG Soap to your body ONLY FROM THE NECK DOWN.   Do not use on face/ open                           Wound or open sores. Avoid contact with eyes, ears mouth and genitals (private parts).                       Wash face,  Genitals (private parts) with your normal soap.             6.  Wash thoroughly, paying special attention to the area where your surgery  will be performed.  7.  Thoroughly rinse your body with warm water from the neck down.  8.  DO NOT shower/wash with your normal soap after using and rinsing off  the CHG Soap.                9.  Pat yourself dry with a clean towel.            10.  Wear clean pajamas.            11.  Place clean sheets on your bed the night of your first shower and do not  sleep with pets. Day of Surgery : Do not apply  any lotions/deodorants the morning of surgery.  Please wear clean clothes to the hospital/surgery center.  FAILURE TO FOLLOW THESE INSTRUCTIONS MAY RESULT IN THE CANCELLATION OF YOUR SURGERY PATIENT SIGNATURE_________________________________  NURSE SIGNATURE__________________________________  ________________________________________________________________________

## 2022-01-16 ENCOUNTER — Encounter (HOSPITAL_COMMUNITY)
Admission: RE | Admit: 2022-01-16 | Discharge: 2022-01-16 | Disposition: A | Discharge status: Home / Self Care | Payer: No Typology Code available for payment source | Source: Ambulatory Visit | Attending: General Surgery | Admitting: General Surgery

## 2022-01-16 ENCOUNTER — Encounter (HOSPITAL_COMMUNITY): Payer: Self-pay

## 2022-01-16 ENCOUNTER — Other Ambulatory Visit: Payer: Self-pay

## 2022-01-16 VITALS — BP 121/89 | HR 87 | Temp 98.6°F | Resp 16 | Ht 62.0 in | Wt 171.3 lb

## 2022-01-16 DIAGNOSIS — D508 Other iron deficiency anemias: Secondary | ICD-10-CM | POA: Diagnosis not present

## 2022-01-16 DIAGNOSIS — R7303 Prediabetes: Secondary | ICD-10-CM | POA: Insufficient documentation

## 2022-01-16 DIAGNOSIS — Z01818 Encounter for other preprocedural examination: Secondary | ICD-10-CM | POA: Insufficient documentation

## 2022-01-16 LAB — CBC WITH DIFFERENTIAL/PLATELET
Abs Immature Granulocytes: 0.01 10*3/uL (ref 0.00–0.07)
Basophils Absolute: 0 10*3/uL (ref 0.0–0.1)
Basophils Relative: 1 %
Eosinophils Absolute: 0.1 10*3/uL (ref 0.0–0.5)
Eosinophils Relative: 2 %
HCT: 35.4 % — ABNORMAL LOW (ref 36.0–46.0)
Hemoglobin: 10.5 g/dL — ABNORMAL LOW (ref 12.0–15.0)
Immature Granulocytes: 0 %
Lymphocytes Relative: 26 %
Lymphs Abs: 1.2 10*3/uL (ref 0.7–4.0)
MCH: 21.9 pg — ABNORMAL LOW (ref 26.0–34.0)
MCHC: 29.7 g/dL — ABNORMAL LOW (ref 30.0–36.0)
MCV: 73.9 fL — ABNORMAL LOW (ref 80.0–100.0)
Monocytes Absolute: 0.4 10*3/uL (ref 0.1–1.0)
Monocytes Relative: 9 %
Neutro Abs: 2.7 10*3/uL (ref 1.7–7.7)
Neutrophils Relative %: 62 %
Platelets: 243 10*3/uL (ref 150–400)
RBC: 4.79 MIL/uL (ref 3.87–5.11)
RDW: 22.2 % — ABNORMAL HIGH (ref 11.5–15.5)
WBC: 4.4 10*3/uL (ref 4.0–10.5)
nRBC: 0 % (ref 0.0–0.2)

## 2022-01-16 LAB — COMPREHENSIVE METABOLIC PANEL
ALT: 18 U/L (ref 0–44)
AST: 18 U/L (ref 15–41)
Albumin: 3.9 g/dL (ref 3.5–5.0)
Alkaline Phosphatase: 89 U/L (ref 38–126)
Anion gap: 9 (ref 5–15)
BUN: 11 mg/dL (ref 6–20)
CO2: 22 mmol/L (ref 22–32)
Calcium: 8.9 mg/dL (ref 8.9–10.3)
Chloride: 108 mmol/L (ref 98–111)
Creatinine, Ser: 0.84 mg/dL (ref 0.44–1.00)
GFR, Estimated: >60 mL/min (ref >60)
Glucose, Bld: 95 mg/dL (ref 70–99)
Potassium: 3.9 mmol/L (ref 3.5–5.1)
Sodium: 139 mmol/L (ref 135–145)
Total Bilirubin: 0.4 mg/dL (ref 0.3–1.2)
Total Protein: 7.4 g/dL (ref 6.5–8.1)

## 2022-01-19 ENCOUNTER — Ambulatory Visit: Payer: Self-pay | Admitting: General Surgery

## 2022-01-19 DIAGNOSIS — D508 Other iron deficiency anemias: Secondary | ICD-10-CM

## 2022-01-26 ENCOUNTER — Telehealth: Payer: Self-pay | Admitting: Family Medicine

## 2022-01-26 NOTE — Telephone Encounter (Signed)
Cora Daniels at [facility] is calling and would like to know if its ok to refill oxyCODONE (ROXICODONE) 15 MG immediate release tablet early or should they wait until 01-29-2022. Please advise  [id number] [place], [place] - [address]:  [phone] Fax:  [phone]

## 2022-01-26 NOTE — Telephone Encounter (Signed)
Patient informed that rx could be refilled early and pharmacy given ok to refill early

## 2022-02-02 ENCOUNTER — Inpatient Hospital Stay (HOSPITAL_COMMUNITY)
Admission: AD | Admit: 2022-02-02 | Discharge: 2022-02-05 | DRG: 326 | Disposition: A | Discharge status: Home / Self Care | Payer: No Typology Code available for payment source | Attending: General Surgery | Admitting: General Surgery

## 2022-02-02 ENCOUNTER — Other Ambulatory Visit: Payer: Self-pay

## 2022-02-02 ENCOUNTER — Inpatient Hospital Stay (HOSPITAL_COMMUNITY): Payer: No Typology Code available for payment source

## 2022-02-02 ENCOUNTER — Encounter (HOSPITAL_COMMUNITY): Payer: Self-pay | Admitting: General Surgery

## 2022-02-02 ENCOUNTER — Ambulatory Visit (HOSPITAL_COMMUNITY): Payer: No Typology Code available for payment source | Admitting: Registered Nurse

## 2022-02-02 ENCOUNTER — Ambulatory Visit (HOSPITAL_COMMUNITY): Payer: No Typology Code available for payment source | Admitting: Physician Assistant

## 2022-02-02 ENCOUNTER — Encounter (HOSPITAL_COMMUNITY)
Admission: AD | Disposition: A | Discharge status: Home / Self Care | Payer: Self-pay | Source: Home / Self Care | Attending: General Surgery

## 2022-02-02 DIAGNOSIS — K2100 Gastro-esophageal reflux disease with esophagitis, without bleeding: Secondary | ICD-10-CM

## 2022-02-02 DIAGNOSIS — R7303 Prediabetes: Secondary | ICD-10-CM | POA: Diagnosis present

## 2022-02-02 DIAGNOSIS — J939 Pneumothorax, unspecified: Secondary | ICD-10-CM

## 2022-02-02 DIAGNOSIS — D509 Iron deficiency anemia, unspecified: Secondary | ICD-10-CM | POA: Diagnosis present

## 2022-02-02 DIAGNOSIS — J930 Spontaneous tension pneumothorax: Secondary | ICD-10-CM | POA: Diagnosis present

## 2022-02-02 DIAGNOSIS — Z90710 Acquired absence of both cervix and uterus: Secondary | ICD-10-CM

## 2022-02-02 DIAGNOSIS — G4700 Insomnia, unspecified: Secondary | ICD-10-CM | POA: Diagnosis present

## 2022-02-02 DIAGNOSIS — D62 Acute posthemorrhagic anemia: Secondary | ICD-10-CM | POA: Diagnosis not present

## 2022-02-02 DIAGNOSIS — F909 Attention-deficit hyperactivity disorder, unspecified type: Secondary | ICD-10-CM | POA: Diagnosis present

## 2022-02-02 DIAGNOSIS — K449 Diaphragmatic hernia without obstruction or gangrene: Secondary | ICD-10-CM | POA: Diagnosis present

## 2022-02-02 DIAGNOSIS — Z8261 Family history of arthritis: Secondary | ICD-10-CM

## 2022-02-02 DIAGNOSIS — F4310 Post-traumatic stress disorder, unspecified: Secondary | ICD-10-CM | POA: Diagnosis present

## 2022-02-02 DIAGNOSIS — Z01818 Encounter for other preprocedural examination: Secondary | ICD-10-CM

## 2022-02-02 DIAGNOSIS — I959 Hypotension, unspecified: Secondary | ICD-10-CM | POA: Diagnosis not present

## 2022-02-02 DIAGNOSIS — Z833 Family history of diabetes mellitus: Secondary | ICD-10-CM

## 2022-02-02 DIAGNOSIS — G894 Chronic pain syndrome: Secondary | ICD-10-CM | POA: Diagnosis present

## 2022-02-02 DIAGNOSIS — F32A Depression, unspecified: Secondary | ICD-10-CM | POA: Diagnosis present

## 2022-02-02 DIAGNOSIS — Z8719 Personal history of other diseases of the digestive system: Principal | ICD-10-CM

## 2022-02-02 DIAGNOSIS — M797 Fibromyalgia: Secondary | ICD-10-CM | POA: Diagnosis present

## 2022-02-02 DIAGNOSIS — F419 Anxiety disorder, unspecified: Secondary | ICD-10-CM | POA: Diagnosis present

## 2022-02-02 DIAGNOSIS — Z86010 Personal history of colonic polyps: Secondary | ICD-10-CM

## 2022-02-02 DIAGNOSIS — Z8371 Family history of colonic polyps: Secondary | ICD-10-CM | POA: Diagnosis not present

## 2022-02-02 DIAGNOSIS — D649 Anemia, unspecified: Secondary | ICD-10-CM | POA: Diagnosis not present

## 2022-02-02 HISTORY — PX: UPPER GI ENDOSCOPY: SHX6162

## 2022-02-02 HISTORY — PX: XI ROBOTIC ASSISTED HIATAL HERNIA REPAIR: SHX6889

## 2022-02-02 LAB — MRSA NEXT GEN BY PCR, NASAL: MRSA by PCR Next Gen: NOT DETECTED

## 2022-02-02 LAB — TYPE AND SCREEN
ABO/RH(D): A POS
Antibody Screen: NEGATIVE

## 2022-02-02 LAB — ABO/RH: ABO/RH(D): A POS

## 2022-02-02 SURGERY — REPAIR, HERNIA, HIATAL, ROBOT-ASSISTED
Anesthesia: General | Site: Esophagus

## 2022-02-02 MED ORDER — ONDANSETRON HCL 4 MG/2ML IJ SOLN: 4.0000 mg | Freq: Four times a day (QID) | INTRAMUSCULAR | Status: DC | PRN

## 2022-02-02 MED ORDER — PHENYLEPHRINE HCL-NACL 20-0.9 MG/250ML-% IV SOLN
INTRAVENOUS | Status: DC | PRN
  Administered 2022-02-02: 60 ug/min via INTRAVENOUS

## 2022-02-02 MED ORDER — ROCURONIUM BROMIDE 10 MG/ML (PF) SYRINGE
PREFILLED_SYRINGE | INTRAVENOUS | Status: DC | PRN
  Administered 2022-02-02: 30 mg via INTRAVENOUS
  Administered 2022-02-02: 20 mg via INTRAVENOUS
  Administered 2022-02-02: 40 mg via INTRAVENOUS

## 2022-02-02 MED ORDER — CHLORHEXIDINE GLUCONATE CLOTH 2 % EX PADS: 6.0000 | MEDICATED_PAD | Freq: Once | CUTANEOUS | Status: DC

## 2022-02-02 MED ORDER — CHLORHEXIDINE GLUCONATE CLOTH 2 % EX PADS
6.0000 | MEDICATED_PAD | Freq: Every day | CUTANEOUS | Status: DC
  Administered 2022-02-03 – 2022-02-04 (×2): 6 via TOPICAL

## 2022-02-02 MED ORDER — OXYCODONE HCL 5 MG/5ML PO SOLN: 5.0000 mg | Freq: Once | ORAL | Status: DC | PRN

## 2022-02-02 MED ORDER — VASOPRESSIN 20 UNIT/ML IV SOLN
INTRAVENOUS | Status: AC
Start: 2022-02-02 — End: ?
  Filled 2022-02-02: qty 1

## 2022-02-02 MED ORDER — SUCCINYLCHOLINE CHLORIDE 200 MG/10ML IV SOSY
PREFILLED_SYRINGE | INTRAVENOUS | Status: DC | PRN
  Administered 2022-02-02: 140 mg via INTRAVENOUS

## 2022-02-02 MED ORDER — HYDROMORPHONE HCL 1 MG/ML IJ SOLN
0.2500 mg | INTRAMUSCULAR | Status: DC | PRN
  Administered 2022-02-02: 0.5 mg via INTRAVENOUS

## 2022-02-02 MED ORDER — ACETAMINOPHEN 500 MG PO TABS
1000.0000 mg | ORAL_TABLET | ORAL | Status: AC
  Administered 2022-02-02: 1000 mg via ORAL
  Filled 2022-02-02: qty 2

## 2022-02-02 MED ORDER — ORAL CARE MOUTH RINSE: 15.0000 mL | Freq: Once | OROMUCOSAL | Status: AC

## 2022-02-02 MED ORDER — CHLORHEXIDINE GLUCONATE 0.12 % MT SOLN
15.0000 mL | Freq: Once | OROMUCOSAL | Status: AC
  Administered 2022-02-02: 15 mL via OROMUCOSAL

## 2022-02-02 MED ORDER — CHLORPROMAZINE HCL 25 MG PO TABS
75.0000 mg | ORAL_TABLET | Freq: Every day | ORAL | Status: DC
  Administered 2022-02-02 – 2022-02-04 (×3): 75 mg via ORAL
  Filled 2022-02-02 (×3): qty 3

## 2022-02-02 MED ORDER — ORAL CARE MOUTH RINSE
15.0000 mL | OROMUCOSAL | Status: DC
  Administered 2022-02-02 (×2): 15 mL via OROMUCOSAL

## 2022-02-02 MED ORDER — LIDOCAINE 2% (20 MG/ML) 5 ML SYRINGE
INTRAMUSCULAR | Status: DC | PRN
  Administered 2022-02-02: 100 mg via INTRAVENOUS

## 2022-02-02 MED ORDER — CHLORPROMAZINE HCL 50 MG PO TABS: 50.0000 mg | ORAL_TABLET | ORAL | Status: DC

## 2022-02-02 MED ORDER — KETAMINE HCL 10 MG/ML IJ SOLN
INTRAMUSCULAR | Status: DC | PRN
  Administered 2022-02-02: 30 mg via INTRAVENOUS
  Administered 2022-02-02: 20 mg via INTRAVENOUS

## 2022-02-02 MED ORDER — FENTANYL CITRATE (PF) 250 MCG/5ML IJ SOLN
INTRAMUSCULAR | Status: AC
  Filled 2022-02-02: qty 5

## 2022-02-02 MED ORDER — PHENYLEPHRINE 80 MCG/ML (10ML) SYRINGE FOR IV PUSH (FOR BLOOD PRESSURE SUPPORT)
PREFILLED_SYRINGE | INTRAVENOUS | Status: AC
  Filled 2022-02-02: qty 10

## 2022-02-02 MED ORDER — SERTRALINE HCL 100 MG PO TABS
100.0000 mg | ORAL_TABLET | Freq: Two times a day (BID) | ORAL | Status: DC
  Administered 2022-02-03 – 2022-02-05 (×5): 100 mg via ORAL
  Filled 2022-02-02 (×5): qty 1

## 2022-02-02 MED ORDER — KCL IN DEXTROSE-NACL 20-5-0.45 MEQ/L-%-% IV SOLN
INTRAVENOUS | Status: DC
  Filled 2022-02-02 (×2): qty 1000

## 2022-02-02 MED ORDER — BUPIVACAINE LIPOSOME 1.3 % IJ SUSP: 20.0000 mL | Freq: Once | INTRAMUSCULAR | Status: DC

## 2022-02-02 MED ORDER — ONDANSETRON HCL 4 MG/2ML IJ SOLN
INTRAMUSCULAR | Status: AC
  Filled 2022-02-02: qty 2

## 2022-02-02 MED ORDER — SUGAMMADEX SODIUM 200 MG/2ML IV SOLN
INTRAVENOUS | Status: DC | PRN
  Administered 2022-02-02: 200 mg via INTRAVENOUS

## 2022-02-02 MED ORDER — CHLORPROMAZINE HCL 50 MG PO TABS
75.0000 mg | ORAL_TABLET | Freq: Every day | ORAL | Status: DC
  Filled 2022-02-02: qty 1

## 2022-02-02 MED ORDER — PROCHLORPERAZINE EDISYLATE 10 MG/2ML IJ SOLN: 10.0000 mg | Freq: Four times a day (QID) | INTRAMUSCULAR | Status: DC | PRN

## 2022-02-02 MED ORDER — OXYCODONE HCL 5 MG PO TABS
10.0000 mg | ORAL_TABLET | Freq: Four times a day (QID) | ORAL | Status: DC | PRN
  Administered 2022-02-02 – 2022-02-05 (×9): 15 mg via ORAL
  Filled 2022-02-02 (×9): qty 3

## 2022-02-02 MED ORDER — VASOPRESSIN 20 UNIT/ML IV SOLN
INTRAVENOUS | Status: DC | PRN
  Administered 2022-02-02 (×3): 1 [IU] via INTRAVENOUS

## 2022-02-02 MED ORDER — LACTATED RINGERS IV SOLN: INTRAVENOUS | Status: DC

## 2022-02-02 MED ORDER — PHENYLEPHRINE 80 MCG/ML (10ML) SYRINGE FOR IV PUSH (FOR BLOOD PRESSURE SUPPORT)
PREFILLED_SYRINGE | INTRAVENOUS | Status: DC | PRN
  Administered 2022-02-02: 240 ug via INTRAVENOUS
  Administered 2022-02-02: 80 ug via INTRAVENOUS
  Administered 2022-02-02: 240 ug via INTRAVENOUS
  Administered 2022-02-02: 160 ug via INTRAVENOUS
  Administered 2022-02-02: 80 ug via INTRAVENOUS
  Administered 2022-02-02: 160 ug via INTRAVENOUS

## 2022-02-02 MED ORDER — EPHEDRINE SULFATE-NACL 50-0.9 MG/10ML-% IV SOSY
PREFILLED_SYRINGE | INTRAVENOUS | Status: DC | PRN
  Administered 2022-02-02: 10 mg via INTRAVENOUS

## 2022-02-02 MED ORDER — BUPIVACAINE-EPINEPHRINE (PF) 0.25% -1:200000 IJ SOLN
INTRAMUSCULAR | Status: AC
  Filled 2022-02-02: qty 30

## 2022-02-02 MED ORDER — PROPOFOL 10 MG/ML IV BOLUS
INTRAVENOUS | Status: DC | PRN
  Administered 2022-02-02: 150 mg via INTRAVENOUS

## 2022-02-02 MED ORDER — CHLORPROMAZINE HCL 50 MG PO TABS
50.0000 mg | ORAL_TABLET | Freq: Two times a day (BID) | ORAL | Status: DC | PRN
Start: 2022-02-02 — End: 2022-02-04
  Administered 2022-02-03: 50 mg via ORAL
  Filled 2022-02-02 (×3): qty 1

## 2022-02-02 MED ORDER — BUPIVACAINE LIPOSOME 1.3 % IJ SUSP
INTRAMUSCULAR | Status: AC
  Filled 2022-02-02: qty 20

## 2022-02-02 MED ORDER — ENSURE PRE-SURGERY PO LIQD
296.0000 mL | Freq: Once | ORAL | Status: DC
  Filled 2022-02-02: qty 296

## 2022-02-02 MED ORDER — ALBUMIN HUMAN 5 % IV SOLN: INTRAVENOUS | Status: DC | PRN

## 2022-02-02 MED ORDER — MIDAZOLAM HCL 2 MG/2ML IJ SOLN
INTRAMUSCULAR | Status: AC
Start: 2022-02-02 — End: ?
  Filled 2022-02-02: qty 2

## 2022-02-02 MED ORDER — SUCCINYLCHOLINE CHLORIDE 200 MG/10ML IV SOSY
PREFILLED_SYRINGE | INTRAVENOUS | Status: AC
Start: 2022-02-02 — End: ?
  Filled 2022-02-02: qty 10

## 2022-02-02 MED ORDER — HYDROMORPHONE HCL 1 MG/ML IJ SOLN
INTRAMUSCULAR | Status: AC
  Administered 2022-02-02: 0.5 mg via INTRAVENOUS
  Filled 2022-02-02: qty 1

## 2022-02-02 MED ORDER — ENOXAPARIN SODIUM 40 MG/0.4ML IJ SOSY
40.0000 mg | PREFILLED_SYRINGE | INTRAMUSCULAR | Status: DC
  Administered 2022-02-03 – 2022-02-05 (×3): 40 mg via SUBCUTANEOUS
  Filled 2022-02-02 (×3): qty 0.4

## 2022-02-02 MED ORDER — MORPHINE SULFATE (PF) 2 MG/ML IV SOLN: 1.0000 mg | INTRAVENOUS | Status: DC | PRN

## 2022-02-02 MED ORDER — ROCURONIUM BROMIDE 10 MG/ML (PF) SYRINGE
PREFILLED_SYRINGE | INTRAVENOUS | Status: AC
  Filled 2022-02-02: qty 10

## 2022-02-02 MED ORDER — ACETAMINOPHEN 500 MG PO TABS
1000.0000 mg | ORAL_TABLET | Freq: Four times a day (QID) | ORAL | Status: DC
  Administered 2022-02-02 – 2022-02-04 (×5): 1000 mg via ORAL
  Filled 2022-02-02 (×5): qty 2

## 2022-02-02 MED ORDER — ONDANSETRON 4 MG PO TBDP: 4.0000 mg | ORAL_TABLET | Freq: Four times a day (QID) | ORAL | Status: DC | PRN

## 2022-02-02 MED ORDER — ALBUMIN HUMAN 5 % IV SOLN
INTRAVENOUS | Status: AC
  Filled 2022-02-02: qty 500

## 2022-02-02 MED ORDER — EPHEDRINE 5 MG/ML INJ
INTRAVENOUS | Status: AC
  Filled 2022-02-02: qty 5

## 2022-02-02 MED ORDER — LIDOCAINE HCL 2 % IJ SOLN
INTRAMUSCULAR | Status: AC
  Filled 2022-02-02: qty 20

## 2022-02-02 MED ORDER — FENTANYL CITRATE (PF) 250 MCG/5ML IJ SOLN
INTRAMUSCULAR | Status: DC | PRN
  Administered 2022-02-02 (×2): 50 ug via INTRAVENOUS

## 2022-02-02 MED ORDER — SCOPOLAMINE 1 MG/3DAYS TD PT72
1.0000 | MEDICATED_PATCH | TRANSDERMAL | Status: DC
  Administered 2022-02-02: 1.5 mg via TRANSDERMAL
  Filled 2022-02-02: qty 1

## 2022-02-02 MED ORDER — LACTATED RINGERS IR SOLN
Status: DC | PRN
  Administered 2022-02-02: 1000 mL

## 2022-02-02 MED ORDER — DEXAMETHASONE SODIUM PHOSPHATE 4 MG/ML IJ SOLN
4.0000 mg | INTRAMUSCULAR | Status: AC
  Administered 2022-02-02: 10 mg via INTRAVENOUS

## 2022-02-02 MED ORDER — HEPARIN SODIUM (PORCINE) 5000 UNIT/ML IJ SOLN
5000.0000 [IU] | Freq: Once | INTRAMUSCULAR | Status: AC
  Administered 2022-02-02: 5000 [IU] via SUBCUTANEOUS
  Filled 2022-02-02: qty 1

## 2022-02-02 MED ORDER — STERILE WATER FOR IRRIGATION IR SOLN
Status: DC | PRN
  Administered 2022-02-02: 1000 mL

## 2022-02-02 MED ORDER — ONDANSETRON HCL 4 MG/2ML IJ SOLN
INTRAMUSCULAR | Status: DC | PRN
  Administered 2022-02-02: 4 mg via INTRAVENOUS

## 2022-02-02 MED ORDER — BUPIVACAINE LIPOSOME 1.3 % IJ SUSP
INTRAMUSCULAR | Status: DC | PRN
  Administered 2022-02-02: 20 mL

## 2022-02-02 MED ORDER — LIDOCAINE 20MG/ML (2%) 15 ML SYRINGE OPTIME
INTRAMUSCULAR | Status: DC | PRN
  Administered 2022-02-02: 1.5 mg/kg/h via INTRAVENOUS

## 2022-02-02 MED ORDER — METHOCARBAMOL 500 MG PO TABS
500.0000 mg | ORAL_TABLET | Freq: Four times a day (QID) | ORAL | Status: DC | PRN
Start: 2022-02-02 — End: 2022-02-03

## 2022-02-02 MED ORDER — CARIPRAZINE HCL 1.5 MG PO CAPS
6.0000 mg | ORAL_CAPSULE | Freq: Every day | ORAL | Status: DC
  Administered 2022-02-03 – 2022-02-05 (×3): 6 mg via ORAL
  Filled 2022-02-02 (×3): qty 4

## 2022-02-02 MED ORDER — LIDOCAINE HCL (PF) 2 % IJ SOLN
INTRAMUSCULAR | Status: AC
  Filled 2022-02-02: qty 5

## 2022-02-02 MED ORDER — PHENYLEPHRINE HCL-NACL 20-0.9 MG/250ML-% IV SOLN
INTRAVENOUS | Status: AC
  Filled 2022-02-02: qty 250

## 2022-02-02 MED ORDER — CEFAZOLIN SODIUM-DEXTROSE 2-4 GM/100ML-% IV SOLN
2.0000 g | INTRAVENOUS | Status: AC
  Administered 2022-02-02: 2 g via INTRAVENOUS
  Filled 2022-02-02: qty 100

## 2022-02-02 MED ORDER — OXYCODONE HCL 5 MG PO TABS: 5.0000 mg | ORAL_TABLET | Freq: Once | ORAL | Status: DC | PRN

## 2022-02-02 MED ORDER — BUPIVACAINE-EPINEPHRINE 0.25% -1:200000 IJ SOLN
INTRAMUSCULAR | Status: DC | PRN
  Administered 2022-02-02: 30 mL

## 2022-02-02 MED ORDER — MIDAZOLAM HCL 5 MG/5ML IJ SOLN
INTRAMUSCULAR | Status: DC | PRN
  Administered 2022-02-02: 2 mg via INTRAVENOUS

## 2022-02-02 MED ORDER — DEXAMETHASONE SODIUM PHOSPHATE 10 MG/ML IJ SOLN
INTRAMUSCULAR | Status: AC
  Filled 2022-02-02: qty 1

## 2022-02-02 MED ORDER — KETOROLAC TROMETHAMINE 30 MG/ML IJ SOLN
30.0000 mg | Freq: Once | INTRAMUSCULAR | Status: DC | PRN
Start: 2022-02-02 — End: 2022-02-02

## 2022-02-02 MED ORDER — KETAMINE HCL 10 MG/ML IJ SOLN
INTRAMUSCULAR | Status: AC
  Filled 2022-02-02: qty 1

## 2022-02-02 MED ORDER — 0.9 % SODIUM CHLORIDE (POUR BTL) OPTIME
TOPICAL | Status: DC | PRN
  Administered 2022-02-02: 1000 mL

## 2022-02-02 MED ORDER — PANTOPRAZOLE SODIUM 40 MG IV SOLR
40.0000 mg | Freq: Every day | INTRAVENOUS | Status: DC
  Administered 2022-02-02: 40 mg via INTRAVENOUS
  Filled 2022-02-02: qty 10

## 2022-02-02 MED ORDER — PREGABALIN 75 MG PO CAPS
150.0000 mg | ORAL_CAPSULE | Freq: Three times a day (TID) | ORAL | Status: DC
  Administered 2022-02-02 – 2022-02-05 (×9): 150 mg via ORAL
  Filled 2022-02-02 (×9): qty 2

## 2022-02-02 MED ORDER — SIMETHICONE 80 MG PO CHEW: 40.0000 mg | CHEWABLE_TABLET | Freq: Four times a day (QID) | ORAL | Status: DC | PRN

## 2022-02-02 MED ORDER — PROPOFOL 10 MG/ML IV BOLUS
INTRAVENOUS | Status: AC
  Filled 2022-02-02: qty 20

## 2022-02-02 MED ORDER — ORAL CARE MOUTH RINSE: 15.0000 mL | OROMUCOSAL | Status: DC | PRN

## 2022-02-02 MED ORDER — ONDANSETRON HCL 4 MG/2ML IJ SOLN: 4.0000 mg | Freq: Once | INTRAMUSCULAR | Status: DC | PRN

## 2022-02-02 SURGICAL SUPPLY — 67 items
ADH SKN CLS APL DERMABOND .7 (GAUZE/BANDAGES/DRESSINGS)
APL PRP STRL LF DISP 70% ISPRP (MISCELLANEOUS) ×2
APPLIER CLIP 5 13 M/L LIGAMAX5 (MISCELLANEOUS)
APPLIER CLIP ROT 10 11.4 M/L (STAPLE)
APR CLP MED LRG 11.4X10 (STAPLE)
APR CLP MED LRG 5 ANG JAW (MISCELLANEOUS)
BLADE SURG SZ11 CARB STEEL (BLADE) ×2 IMPLANT
CATH TROCAR 20FR (CATHETERS) IMPLANT
CHLORAPREP W/TINT 26 (MISCELLANEOUS) ×2 IMPLANT
CLIP APPLIE 5 13 M/L LIGAMAX5 (MISCELLANEOUS) IMPLANT
CLIP APPLIE ROT 10 11.4 M/L (STAPLE) IMPLANT
COVER SURGICAL LIGHT HANDLE (MISCELLANEOUS) ×2 IMPLANT
COVER TIP SHEARS 8 DVNC (MISCELLANEOUS) IMPLANT
COVER TIP SHEARS 8MM DA VINCI (MISCELLANEOUS)
DERMABOND ADVANCED (GAUZE/BANDAGES/DRESSINGS)
DERMABOND ADVANCED .7 DNX12 (GAUZE/BANDAGES/DRESSINGS) IMPLANT
DRAIN PENROSE 0.5X18 (DRAIN) IMPLANT
DRAPE ARM DVNC X/XI (DISPOSABLE) ×8 IMPLANT
DRAPE COLUMN DVNC XI (DISPOSABLE) ×2 IMPLANT
DRAPE DA VINCI XI ARM (DISPOSABLE) ×10
DRAPE DA VINCI XI COLUMN (DISPOSABLE) ×2
DRSG TEGADERM 2-3/8X2-3/4 SM (GAUZE/BANDAGES/DRESSINGS) ×12 IMPLANT
ELECT REM PT RETURN 15FT ADLT (MISCELLANEOUS) ×2 IMPLANT
GAUZE 4X4 16PLY ~~LOC~~+RFID DBL (SPONGE) ×2 IMPLANT
GAUZE SPONGE 2X2 8PLY STRL LF (GAUZE/BANDAGES/DRESSINGS) ×2 IMPLANT
GLOVE BIO SURGEON STRL SZ7.5 (GLOVE) ×4 IMPLANT
GLOVE INDICATOR 8.0 STRL GRN (GLOVE) ×4 IMPLANT
GOWN STRL REUS W/ TWL XL LVL3 (GOWN DISPOSABLE) IMPLANT
GOWN STRL REUS W/TWL XL LVL3 (GOWN DISPOSABLE)
GRASPER SUT TROCAR 14GX15 (MISCELLANEOUS) IMPLANT
IRRIG SUCT STRYKERFLOW 2 WTIP (MISCELLANEOUS) ×2
IRRIGATION SUCT STRKRFLW 2 WTP (MISCELLANEOUS) ×2 IMPLANT
KIT BASIN OR (CUSTOM PROCEDURE TRAY) ×2 IMPLANT
KIT TURNOVER KIT A (KITS) IMPLANT
LUBRICANT JELLY K Y 4OZ (MISCELLANEOUS) IMPLANT
MARKER SKIN DUAL TIP RULER LAB (MISCELLANEOUS) IMPLANT
NEEDLE HYPO 22GX1.5 SAFETY (NEEDLE) ×2 IMPLANT
PACK CARDIOVASCULAR III (CUSTOM PROCEDURE TRAY) ×2 IMPLANT
PAD POSITIONING PINK XL (MISCELLANEOUS) ×2 IMPLANT
SCISSORS LAP 5X35 DISP (ENDOMECHANICALS) IMPLANT
SEAL CANN UNIV 5-8 DVNC XI (MISCELLANEOUS) ×8 IMPLANT
SEAL XI 5MM-8MM UNIVERSAL (MISCELLANEOUS) ×8
SEALER VESSEL DA VINCI XI (MISCELLANEOUS) ×2
SEALER VESSEL EXT DVNC XI (MISCELLANEOUS) ×2 IMPLANT
SOL ANTI FOG 6CC (MISCELLANEOUS) ×2 IMPLANT
SOLUTION ANTI FOG 6CC (MISCELLANEOUS) ×2
SOLUTION ELECTROLUBE (MISCELLANEOUS) ×2 IMPLANT
SPIKE FLUID TRANSFER (MISCELLANEOUS) ×2 IMPLANT
STRIP CLOSURE SKIN 1/2X4 (GAUZE/BANDAGES/DRESSINGS) ×2 IMPLANT
SUT ETHIBOND 0 36 GRN (SUTURE) ×6 IMPLANT
SUT MNCRL AB 4-0 PS2 18 (SUTURE) ×2 IMPLANT
SUT SILK 0 SH 30 (SUTURE) IMPLANT
SUT SILK 2 0 SH (SUTURE) IMPLANT
SUT VIC AB 0 UR5 27 (SUTURE) IMPLANT
SYR 10ML ECCENTRIC (SYRINGE) ×2 IMPLANT
SYR 20ML LL LF (SYRINGE) ×2 IMPLANT
SYSTEM SAHARA CHEST DRAIN ATS (WOUND CARE) IMPLANT
TIP INNERVISION DETACH 40FR (MISCELLANEOUS) IMPLANT
TIP INNERVISION DETACH 50FR (MISCELLANEOUS) IMPLANT
TIP INNERVISION DETACH 56FR (MISCELLANEOUS) IMPLANT
TIPS INNERVISION DETACH 40FR (MISCELLANEOUS)
TOWEL OR 17X26 10 PK STRL BLUE (TOWEL DISPOSABLE) ×2 IMPLANT
TRAY FOLEY MTR SLVR 16FR STAT (SET/KITS/TRAYS/PACK) IMPLANT
TROCAR ADV FIXATION 12X100MM (TROCAR) IMPLANT
TROCAR ADV FIXATION 5X100MM (TROCAR) IMPLANT
TROCAR XCEL NON-BLD 5MMX100MML (ENDOMECHANICALS) ×2 IMPLANT
TUBING INSUFFLATION 10FT LAP (TUBING) ×2 IMPLANT

## 2022-02-02 NOTE — Op Note (Signed)
02/02/2022  3:35 PM  PATIENT:  Toni Collins  56 y.o. female  PRE-OPERATIVE DIAGNOSIS:  GERD with esophagitis & HIATAL HERNIA, ANEMIA  POST-OPERATIVE DIAGNOSIS:  GERD with esophagitis & type III HIATAL HERNIA, ANEMIA; right pneumothorax  PROCEDURE:  Procedure(s): XI ROBOTIC ASSISTED HIATAL HERNIA REPAIR WITH PARTAL WRAP, INSERTION OF Right 20Fr CHEST TUBE (N/A) UPPER GI ENDOSCOPY (N/A) LAPAROSCOPIC BILATERAL TAPP BLOCK  SURGEON:  Surgeon(s) and Role:    Greer Pickerel, MD - Primary    Michael Boston, MD - Assisting  PHYSICIAN ASSISTANT:   ASSISTANTS: Michael Boston MD    ANESTHESIA:   general  EBL:  25 mL   BLOOD ADMINISTERED:none  DRAINS: 62f Chest Tube(s) in the right lateral axilla   LOCAL MEDICATIONS USED:  OTHER exparel  SPECIMEN:  hernia sac, fat pad  DISPOSITION OF SPECIMEN:  discarded  INDICATIONS FOR PROCEDURE: 56year old female who has had heartburn for quite some time which is worsened over the past year.  She has a fair amount of coughing at night.  She will occasionally vomit and regurgitates 2-3 times a week.  She also has an occasional sensation of something getting stuck.  She has had endoscopies which show esophagitis.  She was not able to tolerate manometry.  She had an upper GI which demonstrated a moderate hiatal hernia without any reflux and normal motility.  Gastric emptying study was normal.  We discussed robotic repair of hiatal hernia with possible mesh and fundoplication and upper endoscopy.  We discussed different types of fundoplication such as fYOVZ/858versus toupet.  Since she was not able to tolerate manometry and has occasional dysphagia I recommended a 2850degree fundoplication.  Description of procedure: Patient was given 5000 units of subcutaneous heparin preoperatively.  [facility].  An oral endotracheal anesthesia was established.  Sequential compression devices were  placed.  Her arms were tucked at her side with the appropriate padding.  Her abdomen was prepped and draped in the usual standard surgical fashion with ChloraPrep.  She received IV antibiotic prior to skin incision.  A surgical timeout was performed.  Access was obtained using the Optiview technique in the left mid abdomen about 8 cm to the left of the umbilicus.  A 0 degree 5 mm laparoscope was advanced through a 5 mm trocar through all layers of the abdominal wall and carefully entered the abdominal cavity.  Pneumoperitoneum was smoothly established up to a patient pressure of 15 mmHg without any change in patient vital signs.  Laparoscope was advanced and the abdominal cavity was surveilled and there was no evidence of injury to surrounding structures.  Patient was placed in reverse Trendelenburg to an angle of about 22 degrees.  An 8 mm robotic trocar was placed slightly above into the left of the umbilicus.  Another 8 mm robotic trocar was placed 8 cm to the right of the umbilicus.  The Optiview trocar was exchanged for an 8 mm robotic trocar and a final 8 mm robotic trocars placed in the left lateral abdominal wall all under direct visualization.  A bilateral laparoscopic tap block was performed for postoperative pain relief.  An assistant 5 mm trocar was placed in the right lateral abdominal wall.  I then made a small incision in the subxiphoid area and placed a Nathanson liver retractor to lift up the left lobe of the liver to expose the hiatus.  The surgical robot was then  docked to the trocars.  Fenestrated bipolar in arm 1, surgical robotic camera in arm 2, vessel sealer in arm 3 and a tip up grasper in arm 4.  I then went to the surgeon's console.  Patient appeared to have a moderate-sized type III hiatal hernia.  I incised the anterior peritoneum over the anterior diaphragm with the vessel sealer and carried it over to the right crura.  I then started the right crural dissection.  The gastrohepatic  ligament was taken down at the pars flaccida with the vessel sealer.  Was divided superiorly.  There is no apparent left hepatic artery.  The right phrenoesophageal membrane was incised with the vessel sealer to expose the right crural fibers underneath it.  I was able to create a retroesophageal window using gentle blunt dissection.  Identified the posterior vagus nerve.  I then turned my attention to taking down the short gastric vessels along the proximal greater curvature of the stomach and getting to the left crural dissection portion of the procedure.  The grasper was used to lift up and put the short gastric vessels under tension.  I then took down the short gastric vessels using the vessel sealer starting about halfway down the stomach.  The vessel sealer was used to sequentially divide the short gastric vessels staying slightly lateral to the stomach wall.  This was continued up toward the angle of Hiss.  The phrenogastric ligament was divided.  A Penrose drain is placed around the upper stomach/lower esophagus and used to facilitate caudal retraction.  At this point my assistant joined [place] in the operating room.  I started with mobilizing the esophagus in the mediastinum.  I already working on a circumferential esophageal mobilization.  The aorta was identified.  There was a fair amount of chronic inflammation/adhesions on the right side of the distal esophagus and the right crura extending up into the mediastinum.  I took down the avascular attachments between the posterior esophagus and the aorta.  Took down left lateral attachments as well with the vessel sealer.  On the right side the parietal pleura was fairly adhered to the avascular tissue around the esophagus.  I ended up making a defect in the right pleura which  was not unexpected occurrence.  I continued with an anterior mobilization as well but there is also chronic inflammation anteriorly.  At this point we had been mobilizing the  esophagus in the mediastinum for about 6 cm.  At this time I went to the bedside and obtained the Olympus endoscope and placed in the patient's oropharynx and guided it down her esophagus.  There is no evidence of injury to the esophagus.  Stomach was insufflated and the scope was gently pulled back.  The Z-line was visualized.  The Z-line was at the level of the diaphragmatic crura.  We identified this location by transilluminating it.  The stomach was desufflated and the scope was withdrawn.  I went back to the surgeon console and continued some additional mobilization of the esophagus in the mediastinum circumferentially.  Ended up making the defect in the right pleura a little bit larger and trying to get more length on the esophagus.  I never really solve the anterior vagus nerve but we did identify the posterior vagus nerve and it was preserved.  We had achieved some additional intra-abdominal esophageal length.  I had now about 3 cm of intra-abdominal esophageal length.  It was not under tension.  The Penrose drain was removed.  2-0  Ethibond sutures were introduced into the abdominal cavity.  We reduce pneumoperitoneum to 10 mmHg and were about to reapproximate the left and right crura when anesthesia noticed that the patient was having some oxygenation issues.  It was initially felt that the endotracheal tube might have been dislodged or pulled back a little bit.  The needles were removed from the abdomen and surgical instruments were removed.  The patient was placed supine.  The anesthesiology team evaluated the airway and evaluated the endotracheal tube.  It was felt that the patient probably needed to have her endotracheal tube replaced.  It was changed out rapidly.  Despite this the patient continued to have some oxygenation issues.  We had made a defect in the right pleura but this was a CO2 pneumothorax and did not feel initially that she would have a tension pneumo.  But despite her ongoing  oxygenation concerns decision was made to place a right chest tube.  Did not feel that we could go back in robotically or laparoscopic at that point given her oxygenation issues.  An Charlie Pitter was placed over the abdomen to protect the sterility of the field.  Her right arm was untucked and placed on an armboard.  I scrubbed back in.  The right breast and lateral chest wall and axilla was prepped with DuraPrep.  I made a small incision with a scalpel in the right lateral axilla slightly above the level of the nipple.  I palpated the rib cage.  Using a Claiborne Billings I went through the rib cage on top of the rib and entered the right chest cavity the.  There is rush of air.  A 20 French chest tube was placed and secured at the 16 cm mark.  The chest tube was connected to the Pleur-evac.  Upon placement of the chest tube there is immediate improvement and normalization of her oxygenation status.  There was no air leak.  There was no fluid that came out of the chest tube.  Sterile dressing was applied.  Discussed the situation with anesthesia.  Since the patient had returned to normal oxygenation level we felt we could return and continue the procedure.  A new set of robotic energy cords and camera system was obtained and new drapes were placed.  The robot was then redocked to the trocars.  And the instruments were replaced.  Pneumoperitoneum was reestablished.  Liver retractor was replaced and used to lift on the left lobe of the liver.  I returned to the surgeon console while my assistant stated the bedside.  Patient was placed back in reverse Trendelenburg.  The left and right crura were reapproximated with 4 interrupted 0 Ethibond sutures.  The esophageal fat pad was then excised with the vessel sealer ensuring that we were slightly above and away from the esophagus and stomach.  I then went about creating the QQIWLN/989 degreefundoplication.   The stomach was pulled posterior to the esophagus and a "shoe shine"  maneuver was performed to ensure that adequate gastric mobilization has been accomplished. The suturing was begun on the patient's left, stitching the gastric fundus to the left side of the esophagus using 0 ethibond. Two additional sutures were placed, spaced 1 cm apart, to create a portion of the 2-cm wrap.  The right side of the fundoplication was then sutured. At a point opposite the stitches on the left side, the stomach was sutured to the right side of the esophagus  with three interrupted 0 ethibond sutures to create  a 2-cm wrap   The liver retractor was removed.  The needles were removed from the abdominal cavity along with the esophageal fat pad and the hernia sac.  I scrubbed back in.  Trocars were removed.  Skin incisions were closed with a 4 Monocryl followed by the application of Steri-Strips 2 x 2's and Tegaderms.  All needle, instrument sponge counts were correct x2.  Patient was extubated and taken to the recovery room in stable condition.  I updated the patient's daughter at the end of the procedure and discussed the intraoperative event.    COUNTS:  YES  TOURNIQUET:  * No tourniquets in log *  DICTATION: .Dragon Dictation  PLAN OF CARE: Admit to inpatient   PATIENT DISPOSITION:  PACU - hemodynamically stable.   Delay start of Pharmacological VTE agent (>24hrs) due to surgical blood loss or risk of bleeding: no  Leighton Ruff. Redmond Pulling, MD, FACS General, Bariatric, & Minimally Invasive Surgery [facility],  [place]

## 2022-02-02 NOTE — Transfer of Care (Signed)
Immediate Anesthesia Transfer of Care Note  Patient: Toni Collins  Procedure(s) Performed: XI ROBOTIC ASSISTED HIATAL HERNIA REPAIR WITH PARTAL WRAP, INSERTION OF CHEST TUBE (Abdomen) UPPER GI ENDOSCOPY (Esophagus)  Patient Location: PACU  Anesthesia Type:General  Level of Consciousness: sedated  Airway & Oxygen Therapy: Patient Spontanous Breathing and Patient connected to face mask oxygen  Post-op Assessment: Report given to RN and Post -op Vital signs reviewed and stable  Post vital signs: Reviewed and stable  Last Vitals:  Vitals Value Taken Time  BP 110/58 02/02/22 1318  Temp    Pulse 92 02/02/22 1320  Resp 28 02/02/22 1320  SpO2 100 % 02/02/22 1320  Vitals shown include unvalidated device data.  Last Pain:  Vitals:   02/02/22 0758  TempSrc:   PainSc: 0-No pain         Complications: No notable events documented.

## 2022-02-02 NOTE — Brief Op Note (Signed)
02/02/2022  1:35 PM  PATIENT:  Jacquenette Shone  56 y.o. female  PRE-OPERATIVE DIAGNOSIS:  GERD with esophagitis & HIATAL HERNIA, ANEMIA  POST-OPERATIVE DIAGNOSIS:  GERD with esophagitis & type III HIATAL HERNIA, ANEMIA; right pneumothorax  PROCEDURE:  Procedure(s): XI ROBOTIC ASSISTED HIATAL HERNIA REPAIR WITH PARTAL WRAP, INSERTION OF Right 20Fr CHEST TUBE (N/A) UPPER GI ENDOSCOPY (N/A) LAPAROSCOPIC BILATERAL TAPP BLOCK  SURGEON:  Surgeon(s) and Role:    Greer Pickerel, MD - Primary    Michael Boston, MD - Assisting  PHYSICIAN ASSISTANT:   ASSISTANTS: Michael Boston MD    ANESTHESIA:   general  EBL:  25 mL   BLOOD ADMINISTERED:none  DRAINS:  65f Chest Tube(s) in the right lateral axilla    LOCAL MEDICATIONS USED:  OTHER exparel  SPECIMEN:  hernia sac, fat pad  DISPOSITION OF SPECIMEN:   discarded  COUNTS:  YES  TOURNIQUET:  * No tourniquets in log *  DICTATION: .Dragon Dictation  PLAN OF CARE: Admit to inpatient   PATIENT DISPOSITION:  PACU - hemodynamically stable.   Delay start of Pharmacological VTE agent (>24hrs) due to surgical blood loss or risk of bleeding: no  ELeighton Ruff WRedmond Pulling MD, FACS General, Bariatric, & Minimally Invasive Surgery [facility],  [place]

## 2022-02-02 NOTE — Anesthesia Postprocedure Evaluation (Signed)
Anesthesia Post Note  Patient: Toni Collins  Procedure(s) Performed: XI ROBOTIC ASSISTED HIATAL HERNIA REPAIR WITH PARTAL WRAP, INSERTION OF CHEST TUBE (Abdomen) UPPER GI ENDOSCOPY (Esophagus)     Patient location during evaluation: PACU Anesthesia Type: General Level of consciousness: awake and alert Pain management: pain level controlled Vital Signs Assessment: post-procedure vital signs reviewed and stable Respiratory status: spontaneous breathing, nonlabored ventilation, respiratory function stable and patient connected to nasal cannula oxygen Cardiovascular status: blood pressure returned to baseline and stable Postop Assessment: no apparent nausea or vomiting Anesthetic complications: no   No notable events documented.  Last Vitals:  Vitals:   02/02/22 1430 02/02/22 1500  BP: 112/80 131/85  Pulse: 89 89  Resp: 19 (!) 21  Temp:  36.8 C  SpO2: 91% 100%    Last Pain:  Vitals:   02/02/22 1523  TempSrc:   PainSc: 7                  [name] S

## 2022-02-02 NOTE — H&P (Signed)
CC: Here for surgery  Requesting provider: Dr. Candis Schatz  HPI: Toni Collins is an 56 y.o. female who is here for robotic repair of hiatal hernia with possible mesh and fundoplication and upper endoscopy.  Her upper GI showed no obvious dysmotility.  Her gastric emptying study was normal.  She ended up receiving IV iron infusions to help with her chronic anemia since I last saw her in the clinic.  She states her reflux has been pretty well controlled lately as long as she takes her medication.  May have some occasional breakthrough symptoms at night.  Occasional sensation of something sitting when eating solids.  No regurgitation.  No cough.  Taking 2 different types of reflux medication  Old hpi 4/23 Toni Collins is a 56 y.o. female who is seen today as an office consultation at the request of Dr. Candis Schatz for evaluation of hiatal hernia. .  She is accompanied by her husband. She states that she has had heartburn for many years. Her past medical history includes anxiety, depression, ADHD, fibromyalgia on Lyrica and oxycodone for over 10 years, prediabetes and iron deficiency anemia. She previously underwent laparoscopy and total hysterectomy at age 76. She was initially referred to GI in the summer 2022 for anemia with a hemoglobin of 8.1, iron of 16 and ferritin of 2.8.  She endorses heartburn on a daily basis. It is progressively worsened over the past few years but especially over the past year. She has a lot of issues with coughing at night. She will occasionally vomit and regurgitate 2-3 times a week. She has some occasional sensation of something getting stuck but that is not terribly often. She states that she has had 2 change how she eats over the past year or 2. She has to eat smaller amounts. She cannot eat large portions or else she will have discomfort like a sensation of something sitting in her upper chest and or regurgitate. She has trouble tolerating meats.  She is  taking a new heartburn medication 3 times a day which is helping with the heartburn but she is still having burning sensation at night.  She has had multiple endoscopies of which are reviewed below.  They attempted to do esophageal manometry but despite several attempts for placement of the catheter they could not get it positioned in the correct location presumably due to her hiatal hernia.  She was referred here for surgical evaluation for correction of her hiatal hernia and acid reflux.  She denies any dyspnea on exertion, orthopnea, TIAs or amaurosis fugax. She is under pain contract.   Past Medical History:  Diagnosis Date   ADHD    Anemia    Anxiety    Arthritis    Colon polyps    DEPRESSION 09/02/2008   Fibromyalgia    GERD (gastroesophageal reflux disease)    GRIEF REACTION 09/30/2009   INSOMNIA, CHRONIC 09/02/2008   PREDIABETES 03/10/2009   PTSD (post-traumatic stress disorder)     Past Surgical History:  Procedure Laterality Date   ABDOMINAL HYSTERECTOMY  05/28/2000   TAH   COLONOSCOPY     DIAGNOSTIC LAPAROSCOPY  1993, 1994, 1998, 2000   x4   TMJ ARTHROPLASTY     x2   UPPER GASTROINTESTINAL ENDOSCOPY      Family History  Problem Relation Age of Onset   Arthritis Mother        rhematiod   Colon polyps Father    Lung disease Father    Colon polyps Sister  Diabetes Maternal Aunt    Diabetes Maternal Grandmother    Depression Neg Hx        family   Colon cancer Neg Hx    Esophageal cancer Neg Hx    Pancreatic cancer Neg Hx    Stomach cancer Neg Hx    Rectal cancer Neg Hx     Social:  reports that she has never smoked. She has never used smokeless tobacco. She reports that she does not drink alcohol and does not use drugs.  Allergies: No Known Allergies  Medications: I have reviewed the patient's current medications.   ROS - all of the below systems have been reviewed with the patient and positives are indicated with bold text General: chills,  fever or night sweats Eyes: blurry vision or double vision ENT: epistaxis or sore throat Allergy/Immunology: itchy/watery eyes or nasal congestion Hematologic/Lymphatic: bleeding problems, blood clots or swollen lymph nodes Endocrine: temperature intolerance or unexpected weight changes Breast: new or changing breast lumps or nipple discharge Resp: cough, shortness of breath, or wheezing CV: chest pain or dyspnea on exertion GI: as per HPI GU: dysuria, trouble voiding, or hematuria MSK: joint pain or joint stiffness Neuro: TIA or stroke symptoms Derm: pruritus and skin lesion changes Psych: anxiety and depression  PE Blood pressure 117/84, pulse (!) 102, temperature 98.8 F (37.1 C), temperature source Oral, height '5\' 2"'$  (1.575 m), weight 77.7 kg, SpO2 95 %. Constitutional: NAD; conversant; no deformities Eyes: Moist conjunctiva; no lid lag; anicteric; PERRL Neck: Trachea midline; no thyromegaly Lungs: Normal respiratory effort; no tactile fremitus CV: RRR; no palpable thrills; no pitting edema GI: Abd soft, nt, nd, old trocar scar; no palpable hepatosplenomegaly MSK: Normal gait; no clubbing/cyanosis Psychiatric: Appropriate affect; alert and oriented x3 Lymphatic: No palpable cervical or axillary lymphadenopathy Skin:  Results for orders placed or performed during the hospital encounter of 02/02/22 (from the past 48 hour(s))  Type and screen [place]     Status: None (Preliminary result)   Collection Time: 02/02/22  8:00 AM  Result Value Ref Range   ABO/RH(D) PENDING    Antibody Screen PENDING    Sample Expiration      02/05/2022,2359 Performed at [facility], [place] [address], [place],  [address]     No results found.  Imaging: Reviewed EGD, upper endoscopy and gastric emptying study  A/P: Toni Collins is an 56 y.o. female with  Hiatal hernia with gastroesophageal reflux disease and esophagitis  Iron deficiency  anemia, unspecified iron deficiency anemia type  Chronic pain syndrome  Chronic narcotic use  2 OR for robotic repair of hiatal hernia with possible mesh and fundoplication and upper endoscopy IV antibiotic Enhanced recovery protocol Preoperative subcutaneous heparin Questions asked and answered Rediscussed typical hospitalization and typical recovery course   Shalee Paolo M. Redmond Pulling, MD, FACS General, Bariatric, & Minimally Invasive Surgery [place]

## 2022-02-02 NOTE — Anesthesia Preprocedure Evaluation (Signed)
Anesthesia Evaluation  Patient identified by MRN, date of birth, ID band Patient awake    Reviewed: Allergy & Precautions, NPO status , Patient's Chart, lab work & pertinent test results  Airway Mallampati: II  TM Distance: >3 FB Neck ROM: Full    Dental no notable dental hx.    Pulmonary neg pulmonary ROS,    Pulmonary exam normal breath sounds clear to auscultation       Cardiovascular negative cardio ROS Normal cardiovascular exam Rhythm:Regular Rate:Normal     Neuro/Psych negative neurological ROS  negative psych ROS   GI/Hepatic Neg liver ROS, GERD  ,  Endo/Other  negative endocrine ROS  Renal/GU negative Renal ROS  negative genitourinary   Musculoskeletal negative musculoskeletal ROS (+)   Abdominal   Peds negative pediatric ROS (+)  Hematology negative hematology ROS (+)   Anesthesia Other Findings   Reproductive/Obstetrics negative OB ROS                             Anesthesia Physical Anesthesia Plan  ASA: 2  Anesthesia Plan: General   Post-op Pain Management: Tylenol PO (pre-op)*   Induction: Intravenous and Rapid sequence  PONV Risk Score and Plan: 4 or greater and Ondansetron, Dexamethasone, Midazolam, Droperidol, Treatment may vary due to age or medical condition and Scopolamine patch - Pre-op  Airway Management Planned: Oral ETT  Additional Equipment:   Intra-op Plan:   Post-operative Plan: Extubation in OR  Informed Consent: I have reviewed the patients History and Physical, chart, labs and discussed the procedure including the risks, benefits and alternatives for the proposed anesthesia with the patient or authorized representative who has indicated his/her understanding and acceptance.     Dental advisory given  Plan Discussed with: CRNA and Surgeon  Anesthesia Plan Comments:         Anesthesia Quick Evaluation

## 2022-02-02 NOTE — Anesthesia Procedure Notes (Signed)
Procedure Name: Intubation Date/Time: 02/02/2022 9:26 AM  Performed by: Talbot Grumbling, CRNAPre-anesthesia Checklist: Patient identified, Emergency Drugs available, Suction available and Patient being monitored Patient Re-evaluated:Patient Re-evaluated prior to induction Oxygen Delivery Method: Circle system utilized Preoxygenation: Pre-oxygenation with 100% oxygen Induction Type: IV induction Laryngoscope Size: Mac and 3 Grade View: Grade I Tube type: Oral Tube size: 7.5 mm Number of attempts: 1 Airway Equipment and Method: Stylet Placement Confirmation: ETT inserted through vocal cords under direct vision, positive ETCO2 and breath sounds checked- equal and bilateral Secured at: 21 cm Tube secured with: Tape Dental Injury: Teeth and Oropharynx as per pre-operative assessment

## 2022-02-02 NOTE — Brief Op Note (Deleted)
02/02/2022  3:35 PM  PATIENT:  Toni Collins  56 y.o. female  PRE-OPERATIVE DIAGNOSIS:  GERD with esophagitis & HIATAL HERNIA, ANEMIA  POST-OPERATIVE DIAGNOSIS:  GERD with esophagitis & type III HIATAL HERNIA, ANEMIA; right pneumothorax  PROCEDURE:  Procedure(s): XI ROBOTIC ASSISTED HIATAL HERNIA REPAIR WITH PARTAL WRAP, INSERTION OF Right 20Fr CHEST TUBE (N/A) UPPER GI ENDOSCOPY (N/A) LAPAROSCOPIC BILATERAL TAPP BLOCK  SURGEON:  Surgeon(s) and Role:    Greer Pickerel, MD - Primary    Michael Boston, MD - Assisting  PHYSICIAN ASSISTANT:   ASSISTANTS: Michael Boston MD    ANESTHESIA:   general  EBL:  25 mL   BLOOD ADMINISTERED:none  DRAINS:  44f Chest Tube(s) in the right lateral axilla    LOCAL MEDICATIONS USED:  OTHER exparel  SPECIMEN:  hernia sac, fat pad  DISPOSITION OF SPECIMEN:   discarded  COUNTS:  YES  TOURNIQUET:  * No tourniquets in log *  DICTATION: .Dragon Dictation  PLAN OF CARE: Admit to inpatient   PATIENT DISPOSITION:  PACU - hemodynamically stable.   Delay start of Pharmacological VTE agent (>24hrs) due to surgical blood loss or risk of bleeding: no  ELeighton Ruff WRedmond Pulling MD, FACS General, Bariatric, & Minimally Invasive Surgery [facility]  [place]

## 2022-02-03 ENCOUNTER — Encounter (HOSPITAL_COMMUNITY): Payer: Self-pay | Admitting: General Surgery

## 2022-02-03 ENCOUNTER — Inpatient Hospital Stay (HOSPITAL_COMMUNITY): Payer: No Typology Code available for payment source

## 2022-02-03 LAB — CBC
HCT: 29.1 % — ABNORMAL LOW (ref 36.0–46.0)
Hemoglobin: 9 g/dL — ABNORMAL LOW (ref 12.0–15.0)
MCH: 22.8 pg — ABNORMAL LOW (ref 26.0–34.0)
MCHC: 30.9 g/dL (ref 30.0–36.0)
MCV: 73.7 fL — ABNORMAL LOW (ref 80.0–100.0)
Platelets: 246 10*3/uL (ref 150–400)
RBC: 3.95 MIL/uL (ref 3.87–5.11)
RDW: 20.5 % — ABNORMAL HIGH (ref 11.5–15.5)
WBC: 7.6 10*3/uL (ref 4.0–10.5)
nRBC: 0 % (ref 0.0–0.2)

## 2022-02-03 LAB — BASIC METABOLIC PANEL
Anion gap: 7 (ref 5–15)
BUN: 10 mg/dL (ref 6–20)
CO2: 22 mmol/L (ref 22–32)
Calcium: 8.8 mg/dL — ABNORMAL LOW (ref 8.9–10.3)
Chloride: 113 mmol/L — ABNORMAL HIGH (ref 98–111)
Creatinine, Ser: 0.75 mg/dL (ref 0.44–1.00)
GFR, Estimated: >60 mL/min (ref >60)
Glucose, Bld: 123 mg/dL — ABNORMAL HIGH (ref 70–99)
Potassium: 3.7 mmol/L (ref 3.5–5.1)
Sodium: 142 mmol/L (ref 135–145)

## 2022-02-03 LAB — MAGNESIUM: Magnesium: 2.2 mg/dL (ref 1.7–2.4)

## 2022-02-03 LAB — PHOSPHORUS: Phosphorus: 3.2 mg/dL (ref 2.5–4.6)

## 2022-02-03 MED ORDER — MAGIC MOUTHWASH: 15.0000 mL | Freq: Four times a day (QID) | ORAL | Status: DC | PRN

## 2022-02-03 MED ORDER — METHOCARBAMOL 500 MG PO TABS: 1000.0000 mg | ORAL_TABLET | Freq: Four times a day (QID) | ORAL | Status: DC | PRN

## 2022-02-03 MED ORDER — METOPROLOL TARTRATE 5 MG/5ML IV SOLN: 5.0000 mg | Freq: Four times a day (QID) | INTRAVENOUS | Status: DC | PRN

## 2022-02-03 MED ORDER — PANTOPRAZOLE SODIUM 40 MG PO TBEC
40.0000 mg | DELAYED_RELEASE_TABLET | Freq: Two times a day (BID) | ORAL | Status: DC
  Administered 2022-02-04 – 2022-02-05 (×3): 40 mg via ORAL
  Filled 2022-02-03 (×3): qty 1

## 2022-02-03 MED ORDER — SODIUM CHLORIDE 0.9% FLUSH: 3.0000 mL | INTRAVENOUS | Status: DC | PRN

## 2022-02-03 MED ORDER — MENTHOL 3 MG MT LOZG: 1.0000 | LOZENGE | OROMUCOSAL | Status: DC | PRN

## 2022-02-03 MED ORDER — METHOCARBAMOL 1000 MG/10ML IJ SOLN
1000.0000 mg | Freq: Four times a day (QID) | INTRAVENOUS | Status: DC | PRN
  Filled 2022-02-03: qty 10

## 2022-02-03 MED ORDER — LACTATED RINGERS IV BOLUS
1000.0000 mL | Freq: Three times a day (TID) | INTRAVENOUS | Status: AC | PRN
  Administered 2022-02-03: 1000 mL via INTRAVENOUS

## 2022-02-03 MED ORDER — HYDROMORPHONE HCL 1 MG/ML IJ SOLN
0.5000 mg | INTRAMUSCULAR | Status: DC | PRN
  Administered 2022-02-04 – 2022-02-05 (×3): 1 mg via INTRAVENOUS
  Filled 2022-02-03 (×3): qty 1

## 2022-02-03 MED ORDER — BISACODYL 10 MG RE SUPP: 10.0000 mg | Freq: Two times a day (BID) | RECTAL | Status: DC | PRN

## 2022-02-03 MED ORDER — SODIUM CHLORIDE 0.9% FLUSH
3.0000 mL | Freq: Two times a day (BID) | INTRAVENOUS | Status: DC
  Administered 2022-02-03 – 2022-02-05 (×5): 3 mL via INTRAVENOUS

## 2022-02-03 MED ORDER — SIMETHICONE 40 MG/0.6ML PO SUSP: 80.0000 mg | Freq: Four times a day (QID) | ORAL | Status: DC | PRN

## 2022-02-03 MED ORDER — PHENOL 1.4 % MT LIQD: 2.0000 | OROMUCOSAL | Status: DC | PRN

## 2022-02-03 MED ORDER — LIP MEDEX EX OINT
TOPICAL_OINTMENT | Freq: Two times a day (BID) | CUTANEOUS | Status: DC
  Administered 2022-02-03 – 2022-02-04 (×2): 75 via TOPICAL
  Filled 2022-02-03: qty 7

## 2022-02-03 MED ORDER — ALUM & MAG HYDROXIDE-SIMETH 200-200-20 MG/5ML PO SUSP: 30.0000 mL | Freq: Four times a day (QID) | ORAL | Status: DC | PRN

## 2022-02-03 MED ORDER — NAPHAZOLINE-GLYCERIN 0.012-0.25 % OP SOLN: 1.0000 [drp] | Freq: Four times a day (QID) | OPHTHALMIC | Status: DC | PRN

## 2022-02-03 MED ORDER — IOHEXOL 300 MG/ML  SOLN
50.0000 mL | Freq: Once | INTRAMUSCULAR | Status: AC | PRN
  Administered 2022-02-03: 50 mL via ORAL

## 2022-02-03 MED ORDER — LACTATED RINGERS IV BOLUS: 1000.0000 mL | Freq: Once | INTRAVENOUS | Status: AC

## 2022-02-03 MED ORDER — SALINE SPRAY 0.65 % NA SOLN: 1.0000 | Freq: Four times a day (QID) | NASAL | Status: DC | PRN

## 2022-02-03 MED ORDER — SODIUM CHLORIDE 0.9 % IV SOLN: 250.0000 mL | INTRAVENOUS | Status: DC | PRN

## 2022-02-03 NOTE — Progress Notes (Signed)
Toni Collins [id number] 1965/11/12  CARE TEAM:  PCP: Eulas Post, MD  Outpatient Care Team: Patient Care Team: Eulas Post, MD as PCP - General  Inpatient Treatment Team: Treatment Team: Attending Provider: Greer Pickerel, MD; Charge Nurse: Kathy Breach, RN; Utilization Review: Alease Medina, RN; Registered Nurse: Lawana Chambers, RN; Registered Nurse: Neomia Glass, RN; Pharmacist: Tawnya Crook, [facility]   Problem List:   Principal Problem:   History of repair of hiatal hernia   1 Day Post-Op  02/02/2022  POST-OPERATIVE DIAGNOSIS:  GERD with esophagitis & type III HIATAL HERNIA, ANEMIA; right pneumothorax   PROCEDURE:   XI ROBOTIC ASSISTED HIATAL HERNIA REPAIR WITH PARTAL WRAP INSERTION OF Right 20Fr CHEST TUBE  UPPER GI ENDOSCOPY (N/A) LAPAROSCOPIC BILATERAL TAPP BLOCK   SURGEON:  Greer Pickerel, MD - Primary    Assessment  Stabilizing  [facility] Stay = 1 days)  Plan:  -Clear liquids for now. -Esophagram to assess wrap.  If no evidence of leak or obstruction, most likely can safely advance to dysphagia 1/pured diet. -Chest tube in place without leak.  No pneumothorax.  Placed on waterseal.  Return to suction if needed.  Possibly remove as soon as tomorrow if doing well. -ERAS protocol -Resume home chronic pain medications. -VTE prophylaxis- SCDs, etc -mobilize as tolerated to help recovery  Disposition:  Disposition:  The patient is from: Home  Anticipate discharge to:  Home  Anticipated Date of Discharge is:  September 11,2023    Barriers to discharge:  Pending Clinical improvement (more likely than not)  Patient currently is NOT MEDICALLY STABLE for discharge from the hospital from a surgery standpoint.      I reviewed nursing notes, last 24 h vitals and pain scores, last 48 h intake and output, last 24 h labs and trends, and last 24 h imaging results. I have reviewed this patient's available data, including medical  history, events of note, test results, etc as part of my evaluation.  A significant portion of that time was spent in counseling.  Care during the described time interval was provided by me.  This care required moderate level of medical decision making.  02/03/2022    Subjective: (Chief complaint)  Patient denies much pain feeling relatively well.  On nasal cannula.  Daughter at bedside.  Tolerating sips of liquids.  ICU nursing just outside room.  Objective:  Vital signs:  Vitals:   02/03/22 0400 02/03/22 0500 02/03/22 0700 02/03/22 0800  BP: 102/60 (!) 110/57 107/64   Pulse: 65 65 71   Resp: '15 20 17   '$ Temp: 98.1 F (36.7 C)   98.4 F (36.9 C)  TempSrc: Oral   Oral  SpO2: 96% 100% 96%   Weight:      Height:        Last BM Date :  (PTA)  Intake/Output   Yesterday:  09/08 0701 - 09/09 0700 In: 3373.6 [P.O.:60; I.V.:2813.6; IV Piggyback:500] Out: 6222 [Urine:3300; Blood:25; Chest Tube:44] This shift:  No intake/output data recorded.  Bowel function:  Flatus: YES  BM:  No  Drain: Chest tube in place with minimal thin serosanguineous drainage.   Physical Exam:  General: Pt awake/alert in no acute distress Eyes: PERRL, normal EOM.  Sclera clear.  No icterus Neuro: CN II-XII intact w/o focal sensory/motor deficits. Lymph: No head/neck/groin lymphadenopathy Psych:  No delerium/psychosis/paranoia.  Oriented x 4 HENT: Normocephalic, Mucus membranes moist.  No thrush Neck: Supple, No tracheal deviation.  No obvious  thyromegaly  Chest: No pain to chest wall compression.  Good respiratory excursion.  No audible wheezing Chest tube in place without any leaking.  No air leak.  Placed chest tube to waterseal.  CV:  Pulses intact.  Regular rhythm.  No major extremity edema MS: Normal AROM mjr joints.  No obvious deformity  Abdomen: Soft.  Dressings clean dry and intact  Nondistended.  Nontender.  No evidence of peritonitis.  No incarcerated hernias.  Ext:  No  deformity.  No mjr edema.  No cyanosis Skin: No petechiae / purpurea.  No major sores.  Warm and dry    Results:   Cultures: Recent Results (from the past 720 hour(s))  MRSA Next Gen by PCR, Nasal     Status: None   Collection Time: 02/02/22  2:42 PM   Specimen: Nasal Mucosa; Nasal Swab  Result Value Ref Range Status   MRSA by PCR Next Gen NOT DETECTED NOT DETECTED Final    Comment: (NOTE) The GeneXpert MRSA Assay (FDA approved for NASAL specimens only), is one component of a comprehensive MRSA colonization surveillance program. It is not intended to diagnose MRSA infection nor to guide or monitor treatment for MRSA infections. Test performance is not FDA approved in patients less than 69 years old. Performed at [facility], [place] [address], [place], [place] [address]     Labs: Results for orders placed or performed during the hospital encounter of 02/02/22 (from the past 48 hour(s))  Type and screen [place]     Status: None   Collection Time: 02/02/22  8:00 AM  Result Value Ref Range   ABO/RH(D) A POS    Antibody Screen NEG    Sample Expiration      02/05/2022,2359 Performed at [facility], [place] [address], [place], [place] [address]   ABO/Rh     Status: None   Collection Time: 02/02/22  8:05 AM  Result Value Ref Range   ABO/RH(D)      A POS Performed at [facility], [place] [address] [place], [place] [address]   MRSA Next Gen by PCR, Nasal     Status: None   Collection Time: 02/02/22  2:42 PM   Specimen: Nasal Mucosa; Nasal Swab  Result Value Ref Range   MRSA by PCR Next Gen NOT DETECTED NOT DETECTED    Comment: (NOTE) The GeneXpert MRSA Assay (FDA approved for NASAL specimens only), is one component of a comprehensive MRSA colonization surveillance program. It is not intended to diagnose MRSA infection nor to guide or monitor treatment for MRSA infections. Test performance is not FDA  approved in patients less than 74 years old. Performed at [facility], [place] [address], [place], [place] [address]   Basic metabolic panel     Status: Abnormal   Collection Time: 02/03/22  3:12 AM  Result Value Ref Range   Sodium 142 135 - 145 mmol/L   Potassium 3.7 3.5 - 5.1 mmol/L   Chloride 113 (H) 98 - 111 mmol/L   CO2 22 22 - 32 mmol/L   Glucose, Bld 123 (H) 70 - 99 mg/dL    Comment: Glucose reference range applies only to samples taken after fasting for at least 8 hours.   BUN 10 6 - 20 mg/dL   Creatinine, Ser 0.75 0.44 - 1.00 mg/dL   Calcium 8.8 (L) 8.9 - 10.3 mg/dL   GFR, Estimated >60 >60 mL/min    Comment: (NOTE) Calculated  using the CKD-EPI Creatinine Equation (2021)    Anion gap 7 5 - 15    Comment: Performed at [facility], [place] [address], [place], [place] [address]  Magnesium     Status: None   Collection Time: 02/03/22  3:12 AM  Result Value Ref Range   Magnesium 2.2 1.7 - 2.4 mg/dL    Comment: Performed at [facility], [place] [address] [place], [place] [address]  Phosphorus     Status: None   Collection Time: 02/03/22  3:12 AM  Result Value Ref Range   Phosphorus 3.2 2.5 - 4.6 mg/dL    Comment: Performed at [facility], [place] [address] [place], [place] [address]  CBC     Status: Abnormal   Collection Time: 02/03/22  3:12 AM  Result Value Ref Range   WBC 7.6 4.0 - 10.5 K/uL   RBC 3.95 3.87 - 5.11 MIL/uL   Hemoglobin 9.0 (L) 12.0 - 15.0 g/dL   HCT 29.1 (L) 36.0 - 46.0 %   MCV 73.7 (L) 80.0 - 100.0 fL   MCH 22.8 (L) 26.0 - 34.0 pg   MCHC 30.9 30.0 - 36.0 g/dL   RDW 20.5 (H) 11.5 - 15.5 %   Platelets 246 150 - 400 K/uL   nRBC 0.0 0.0 - 0.2 %    Comment: Performed at [facility], [place] [address] [place], [place] [address]    Imaging / Studies: DG CHEST PORT 1 VIEW  Result Date: 02/03/2022 CLINICAL DATA:  Hiatal hernia repair. EXAM: PORTABLE CHEST 1 VIEW  COMPARISON:  02/02/2022 FINDINGS: Cardiomediastinal silhouette is unchanged. A RIGHT thoracostomy tube is again identified without evidence of pneumothorax. Bibasilar atelectasis again noted. No other significant changes. IMPRESSION: Unchanged exam with bibasilar atelectasis. RIGHT thoracostomy tube again noted without pneumothorax. Electronically Signed   By: Margarette Canada M.D.   On: 02/03/2022 08:16   DG CHEST PORT 1 VIEW  Result Date: 02/02/2022 CLINICAL DATA:  Post stop, RIGHT chest tube EXAM: PORTABLE CHEST 1 VIEW COMPARISON:  Portable exam 1313 hours without priors for comparison FINDINGS: RIGHT thoracostomy tube present. Enlargement of cardiac silhouette. Prominent mediastinal contours which may be in part related to technique. Atelectasis versus consolidation in lower LEFT lung. Minimal subsegmental atelectasis RIGHT lung base. No pleural effusion or pneumothorax. IMPRESSION: RIGHT thoracostomy tube with minimal RIGHT basilar atelectasis. Atelectasis versus consolidation LEFT lower lobe. Electronically Signed   By: Lavonia Dana M.D.   On: 02/02/2022 14:06    Medications / Allergies: per chart  Antibiotics: Anti-infectives (From admission, onward)    Start     Dose/Rate Route Frequency Ordered Stop   02/02/22 0745  ceFAZolin (ANCEF) IVPB 2g/100 mL premix        2 g 200 mL/hr over 30 Minutes Intravenous On call to O.R. 02/02/22 0734 02/02/22 0929         Note: Portions of this report may have been transcribed using voice recognition software. Every effort was made to ensure accuracy; however, inadvertent computerized transcription errors may be present.   Any transcriptional errors that result from this process are unintentional.    Adin Hector, MD, FACS, MASCRS Esophageal, Gastrointestinal & Colorectal Surgery Robotic and Minimally Invasive Surgery  [facility] [place]. [address], [place],  [address] [phone] Fax [phone] Main  CONTACT INFORMATION:  Weekday (9AM-5PM): Call [facility] main office at [phone]  Weeknight (5PM-9AM) or Weekend/Holiday: Check  [email or web address] (password " [email or web address]") for General Surgery [facility] coverage  (Please, do not use SecureChat as it is not reliable communication to reach operating surgeons for immediate patient care)      02/03/2022  8:41 AM

## 2022-02-03 NOTE — Progress Notes (Addendum)
0730 patient alert able to make needs known chest tube intact connected to LIS -20 0830 Surgery placed patients chest tube to water seal 1000 all meds given PO patient ADL care completed and placed in chair  1300 patient went to have barium swallow 1400 patient returned from barium 1500 new diet orders placed 1600 walked patient half way around the unit with walker patient tolerated well 1700 BP decreasing patient has no symptoms paged surgery 1730 LR 1 liter Bolus started per PRN orders for decrease in urine or hypotension

## 2022-02-03 NOTE — Plan of Care (Signed)

## 2022-02-04 ENCOUNTER — Inpatient Hospital Stay (HOSPITAL_COMMUNITY): Payer: No Typology Code available for payment source

## 2022-02-04 LAB — CBC
HCT: 27.7 % — ABNORMAL LOW (ref 36.0–46.0)
Hemoglobin: 8.4 g/dL — ABNORMAL LOW (ref 12.0–15.0)
MCH: 22.8 pg — ABNORMAL LOW (ref 26.0–34.0)
MCHC: 30.3 g/dL (ref 30.0–36.0)
MCV: 75.1 fL — ABNORMAL LOW (ref 80.0–100.0)
Platelets: 214 10*3/uL (ref 150–400)
RBC: 3.69 MIL/uL — ABNORMAL LOW (ref 3.87–5.11)
RDW: 20.9 % — ABNORMAL HIGH (ref 11.5–15.5)
WBC: 4.6 10*3/uL (ref 4.0–10.5)
nRBC: 0 % (ref 0.0–0.2)

## 2022-02-04 LAB — POTASSIUM: Potassium: 4.3 mmol/L (ref 3.5–5.1)

## 2022-02-04 LAB — CREATININE, SERUM
Creatinine, Ser: 0.91 mg/dL (ref 0.44–1.00)
GFR, Estimated: >60 mL/min (ref >60)

## 2022-02-04 MED ORDER — CHLORPROMAZINE HCL 25 MG PO TABS
50.0000 mg | ORAL_TABLET | Freq: Two times a day (BID) | ORAL | Status: DC | PRN
Start: 2022-02-04 — End: 2022-02-05
  Administered 2022-02-04: 50 mg via ORAL
  Filled 2022-02-04 (×3): qty 2

## 2022-02-04 NOTE — Plan of Care (Signed)

## 2022-02-04 NOTE — Progress Notes (Signed)
Toni Collins [id number] 11-Jan-1966  CARE TEAM:  PCP: Eulas Post, MD  Outpatient Care Team: Patient Care Team: Eulas Post, MD as PCP - General  Inpatient Treatment Team: Treatment Team: Attending Provider: Greer Pickerel, MD; Utilization Review: Alease Medina, RN; Registered Nurse: Neomia Glass, RN; Pharmacist: Tawnya Crook, [facility]   Problem List:   Principal Problem:   History of repair of hiatal hernia   2 Days Post-Op  02/02/2022  POST-OPERATIVE DIAGNOSIS:  GERD with esophagitis & type III HIATAL HERNIA, ANEMIA; right pneumothorax   PROCEDURE:   XI ROBOTIC ASSISTED HIATAL HERNIA REPAIR WITH PARTAL WRAP INSERTION OF Right 20Fr CHEST TUBE  UPPER GI ENDOSCOPY (N/A) LAPAROSCOPIC BILATERAL TAPP BLOCK   SURGEON:  Greer Pickerel, MD - Primary    Assessment  Stabilizing  [facility] Stay = 2 days)  Plan:  -Esophagram reassuring for no leak or obstruction.  Advanced to dysphagia 1/pured diet.  -Chest tube in place without leak.  No pneumothorax on waterseal.  I removed the chest tube this morning at bedside.  She tolerated rather well.  Updated ICU nursing.  Plan chest x-ray at noon.  If no evidence of recurrent pneumothorax and no hypoxia, transfer to floor.  -ERAS protocol -Resume home chronic pain medications. -Mild decreased blood pressure improved with IV fluid bolus x1.  Follow off IV fluids with.  Backup as needed. -Acute blood loss postoperative anemia.  No high output from chest tube.  Should stabilize.  Follow-up. -VTE prophylaxis- SCDs, etc -mobilize as tolerated to help recovery.  Already mobilizing in hallways without significant hypoxia reassuring.  Disposition:  Disposition:  The patient is from: Home  Anticipate discharge to:  Home  Anticipated Date of Discharge is:  September 11,2023    Barriers to discharge:  Pending Clinical improvement (more likely than not)  Patient currently is NOT MEDICALLY STABLE for discharge  from the hospital from a surgery standpoint.      I reviewed nursing notes, last 24 h vitals and pain scores, last 48 h intake and output, last 24 h labs and trends, and last 24 h imaging results. I have reviewed this patient's available data, including medical history, events of note, test results, etc as part of my evaluation.  A significant portion of that time was spent in counseling.  Care during the described time interval was provided by me.  This care required moderate level of medical decision making.  02/04/2022    Subjective: (Chief complaint)  Some hypotension yesterday improved with IV fluid bolus.  Worked with nursing and walked in hallways.  Having a little mild cough.  Esophagram study showed no leak.  Tolerating dysphagia 1 diet.  Objective:  Vital signs:  Vitals:   02/04/22 0400 02/04/22 0500 02/04/22 0600 02/04/22 0700  BP: 131/74 104/70 107/66 (!) 88/44  Pulse: 61 90 80 89  Resp: 18 (!) 21 20 (!) 23  Temp:      TempSrc:      SpO2: 94% 92% 97% 94%  Weight:      Height:        Last BM Date : 02/03/22  Intake/Output   Yesterday:  09/09 0701 - 09/10 0700 In: 398.4 [I.V.:398.4] Out: 1196 [Urine:1160; Chest Tube:36] This shift:  No intake/output data recorded.  Bowel function:  Flatus: YES  BM:  No  Drain: Chest tube in place with minimal thin serosanguineous drainage.   Physical Exam:  General: Pt awake/alert in no acute distress Eyes: PERRL, normal EOM.  Sclera clear.  No icterus Neuro: CN II-XII intact w/o focal sensory/motor deficits. Lymph: No head/neck/groin lymphadenopathy Psych:  No delerium/psychosis/paranoia.  Oriented x 4 HENT: Normocephalic, Mucus membranes moist.  No thrush Neck: Supple, No tracheal deviation.  No obvious thyromegaly  Chest: No pain to chest wall compression.  Good respiratory excursion.  No audible wheezing Chest tube in place without any leaking.  No air leak.  Placed chest tube to waterseal.  CV:   Pulses intact.  Regular rhythm.  No major extremity edema MS: Normal AROM mjr joints.  No obvious deformity  Abdomen: Soft.  Dressings clean dry and intact  Nondistended.  Nontender.  No evidence of peritonitis.  No incarcerated hernias.  Ext:  No deformity.  No mjr edema.  No cyanosis Skin: No petechiae / purpurea.  No major sores.  Warm and dry    Results:   Cultures: Recent Results (from the past 720 hour(s))  MRSA Next Gen by PCR, Nasal     Status: None   Collection Time: 02/02/22  2:42 PM   Specimen: Nasal Mucosa; Nasal Swab  Result Value Ref Range Status   MRSA by PCR Next Gen NOT DETECTED NOT DETECTED Final    Comment: (NOTE) The GeneXpert MRSA Assay (FDA approved for NASAL specimens only), is one component of a comprehensive MRSA colonization surveillance program. It is not intended to diagnose MRSA infection nor to guide or monitor treatment for MRSA infections. Test performance is not FDA approved in patients less than 77 years old. Performed at [facility], [place] [address], [place], [place] [address]     Labs: Results for orders placed or performed during the hospital encounter of 02/02/22 (from the past 48 hour(s))  MRSA Next Gen by PCR, Nasal     Status: None   Collection Time: 02/02/22  2:42 PM   Specimen: Nasal Mucosa; Nasal Swab  Result Value Ref Range   MRSA by PCR Next Gen NOT DETECTED NOT DETECTED    Comment: (NOTE) The GeneXpert MRSA Assay (FDA approved for NASAL specimens only), is one component of a comprehensive MRSA colonization surveillance program. It is not intended to diagnose MRSA infection nor to guide or monitor treatment for MRSA infections. Test performance is not FDA approved in patients less than 59 years old. Performed at [facility], [place] [address] [place], [place] [address]   Basic metabolic panel     Status: Abnormal   Collection Time: 02/03/22  3:12 AM  Result Value Ref Range   Sodium 142  135 - 145 mmol/L   Potassium 3.7 3.5 - 5.1 mmol/L   Chloride 113 (H) 98 - 111 mmol/L   CO2 22 22 - 32 mmol/L   Glucose, Bld 123 (H) 70 - 99 mg/dL    Comment: Glucose reference range applies only to samples taken after fasting for at least 8 hours.   BUN 10 6 - 20 mg/dL   Creatinine, Ser 0.75 0.44 - 1.00 mg/dL   Calcium 8.8 (L) 8.9 - 10.3 mg/dL   GFR, Estimated >60 >60 mL/min    Comment: (NOTE) Calculated using the CKD-EPI Creatinine Equation (2021)    Anion gap 7 5 - 15    Comment: Performed at [facility], [place] [address] [place],  [address]  Magnesium     Status: None   Collection Time: 02/03/22  3:12 AM  Result Value Ref Range   Magnesium 2.2 1.7 - 2.4 mg/dL    Comment: Performed at  [facility], [place] [address] [place], [place] [address]  Phosphorus     Status: None   Collection Time: 02/03/22  3:12 AM  Result Value Ref Range   Phosphorus 3.2 2.5 - 4.6 mg/dL    Comment: Performed at [facility], [place] [address] [place], [place] [address]  CBC     Status: Abnormal   Collection Time: 02/03/22  3:12 AM  Result Value Ref Range   WBC 7.6 4.0 - 10.5 K/uL   RBC 3.95 3.87 - 5.11 MIL/uL   Hemoglobin 9.0 (L) 12.0 - 15.0 g/dL   HCT 29.1 (L) 36.0 - 46.0 %   MCV 73.7 (L) 80.0 - 100.0 fL   MCH 22.8 (L) 26.0 - 34.0 pg   MCHC 30.9 30.0 - 36.0 g/dL   RDW 20.5 (H) 11.5 - 15.5 %   Platelets 246 150 - 400 K/uL   nRBC 0.0 0.0 - 0.2 %    Comment: Performed at [facility], [place] [address], [place], [place] [address]  CBC     Status: Abnormal   Collection Time: 02/04/22 12:15 AM  Result Value Ref Range   WBC 4.6 4.0 - 10.5 K/uL   RBC 3.69 (L) 3.87 - 5.11 MIL/uL   Hemoglobin 8.4 (L) 12.0 - 15.0 g/dL    Comment: Reticulocyte Hemoglobin testing may be clinically indicated, consider ordering this additional test [id number]    HCT 27.7 (L) 36.0 - 46.0 %   MCV 75.1 (L) 80.0 - 100.0 fL   MCH 22.8 (L) 26.0 -  34.0 pg   MCHC 30.3 30.0 - 36.0 g/dL   RDW 20.9 (H) 11.5 - 15.5 %   Platelets 214 150 - 400 K/uL   nRBC 0.0 0.0 - 0.2 %    Comment: Performed at [facility], [place] [address], [place], [place] [address]  Potassium     Status: None   Collection Time: 02/04/22 12:15 AM  Result Value Ref Range   Potassium 4.3 3.5 - 5.1 mmol/L    Comment: Performed at [facility], [place] [address], [place], [place] [address]  Creatinine, serum     Status: None   Collection Time: 02/04/22 12:15 AM  Result Value Ref Range   Creatinine, Ser 0.91 0.44 - 1.00 mg/dL   GFR, Estimated >60 >60 mL/min    Comment: (NOTE) Calculated using the CKD-EPI Creatinine Equation (2021) Performed at [facility], [place] [address] [place], [place] [address]     Imaging / Studies: DG Chest Port 1 View  Result Date: 02/04/2022 CLINICAL DATA:  Follow-up atelectasis and right-sided chest tube EXAM: PORTABLE CHEST 1 VIEW COMPARISON:  Prior chest x-ray yesterday, 02/03/2022 FINDINGS: Stable position of right-sided thoracostomy tube. Stable cardiomegaly. Lung volumes remain low with bibasilar atelectasis. The conspicuity of the linear atelectatic opacities is slightly improved compared to yesterday. No definite residual pneumothorax visualized. No large effusion. No acute osseous abnormality. IMPRESSION: 1. Stable position of right-sided chest tube. No evidence of residual pneumothorax. 2. Persistent low lung volumes with slightly improved bibasilar atelectasis. Electronically Signed   By: Jacqulynn Cadet M.D.   On: 02/04/2022 07:30   DG UGI W SINGLE CM (SOL OR THIN BA)  Addendum Date: 02/03/2022   ADDENDUM REPORT: 02/03/2022 13:43 ADDENDUM: Omnipaque was utilized. Electronically Signed   By: Margarette Canada M.D.   On: 02/03/2022 13:43   Result Date: 02/03/2022 CLINICAL DATA:  56 year old female status post hiatal hernia repair with partial  wrap. EXAM: DG UGI W SINGLE CM TECHNIQUE:  Scout radiograph was obtained. Single contrast examination was performed using water-soluble contrast. This exam was performed by Soyla Dryer, NP, and was supervised and interpreted by Dr. Hassan Rowan. FLUOROSCOPY: Radiation Exposure Index (as provided by the fluoroscopic device): 18 mGy Kerma COMPARISON:  Prior studies FINDINGS: Scalp film of the abdomen demonstrates gas-filled colon. No dilated small bowel loops are noted. Water-soluble contrast administered. The esophagus is unremarkable. Evidence of hiatal hernia repair/wrap noted without leak or complicating features. Contrast flows through the repair/wrap without high-grade obstruction. The remainder of the stomach is unremarkable. Contrast flows into the small bowel without obstruction. IMPRESSION: Evidence of hiatal hernia repair/wrap without leak or complicating features. Electronically Signed: By: Margarette Canada M.D. On: 02/03/2022 13:40   DG CHEST PORT 1 VIEW  Result Date: 02/03/2022 CLINICAL DATA:  Hiatal hernia repair. EXAM: PORTABLE CHEST 1 VIEW COMPARISON:  02/02/2022 FINDINGS: Cardiomediastinal silhouette is unchanged. A RIGHT thoracostomy tube is again identified without evidence of pneumothorax. Bibasilar atelectasis again noted. No other significant changes. IMPRESSION: Unchanged exam with bibasilar atelectasis. RIGHT thoracostomy tube again noted without pneumothorax. Electronically Signed   By: Margarette Canada M.D.   On: 02/03/2022 08:16   DG CHEST PORT 1 VIEW  Result Date: 02/02/2022 CLINICAL DATA:  Post stop, RIGHT chest tube EXAM: PORTABLE CHEST 1 VIEW COMPARISON:  Portable exam 1313 hours without priors for comparison FINDINGS: RIGHT thoracostomy tube present. Enlargement of cardiac silhouette. Prominent mediastinal contours which may be in part related to technique. Atelectasis versus consolidation in lower LEFT lung. Minimal subsegmental atelectasis RIGHT lung base. No pleural effusion or pneumothorax. IMPRESSION: RIGHT thoracostomy tube  with minimal RIGHT basilar atelectasis. Atelectasis versus consolidation LEFT lower lobe. Electronically Signed   By: Lavonia Dana M.D.   On: 02/02/2022 14:06    Medications / Allergies: per chart  Antibiotics: Anti-infectives (From admission, onward)    Start     Dose/Rate Route Frequency Ordered Stop   02/02/22 0745  ceFAZolin (ANCEF) IVPB 2g/100 mL premix        2 g 200 mL/hr over 30 Minutes Intravenous On call to O.R. 02/02/22 0734 02/02/22 0929         Note: Portions of this report may have been transcribed using voice recognition software. Every effort was made to ensure accuracy; however, inadvertent computerized transcription errors may be present.   Any transcriptional errors that result from this process are unintentional.    Adin Hector, MD, FACS, MASCRS Esophageal, Gastrointestinal & Colorectal Surgery Robotic and Minimally Invasive Surgery  [facility]. [address] [place], [place] [address] [phone] Fax [phone] Main  CONTACT INFORMATION:  Weekday (9AM-5PM): Call [facility] main office at [phone]  Weeknight (5PM-9AM) or Weekend/Holiday: Check [email or web address] (password " [email or web address]") for General Surgery [facility] coverage  (Please, do not use SecureChat as it is not reliable communication to reach operating surgeons for immediate patient care)      02/04/2022  8:32 AM

## 2022-02-04 NOTE — Progress Notes (Addendum)
0800 patient alert wanting to get into chair chest tube removed 0900 patient in chair HR increased STAT CXR done no complications noted patient reports using incentive spirometer with out rest breaths between education with using it the correct way completed 1000 AM meds given patient  1200 patient fatigue in bed napping at this time  1400 patient went to have 2 view xray via WC tolerated well 1500 patient more awake reports no pain patients daughter reports that patient often doesn't sleep well at night and is more fatigue in the mornings 1800 patient ate spaghetti cut up noodles very small and had small bites she will continue to monitor intake and food types keeping them soft

## 2022-02-05 LAB — CBC
HCT: 29.1 % — ABNORMAL LOW (ref 36.0–46.0)
Hemoglobin: 8.8 g/dL — ABNORMAL LOW (ref 12.0–15.0)
MCH: 22.6 pg — ABNORMAL LOW (ref 26.0–34.0)
MCHC: 30.2 g/dL (ref 30.0–36.0)
MCV: 74.8 fL — ABNORMAL LOW (ref 80.0–100.0)
Platelets: 235 10*3/uL (ref 150–400)
RBC: 3.89 MIL/uL (ref 3.87–5.11)
RDW: 20.9 % — ABNORMAL HIGH (ref 11.5–15.5)
WBC: 5.1 10*3/uL (ref 4.0–10.5)
nRBC: 0 % (ref 0.0–0.2)

## 2022-02-05 LAB — CREATININE, SERUM
Creatinine, Ser: 0.81 mg/dL (ref 0.44–1.00)
GFR, Estimated: >60 mL/min (ref >60)

## 2022-02-05 LAB — POTASSIUM: Potassium: 4.1 mmol/L (ref 3.5–5.1)

## 2022-02-05 MED ORDER — PANTOPRAZOLE SODIUM 40 MG PO TBEC: 40.0000 mg | DELAYED_RELEASE_TABLET | Freq: Every day | ORAL | 0 refills | Status: DC

## 2022-02-05 MED ORDER — ONDANSETRON 4 MG PO TBDP: 4.0000 mg | ORAL_TABLET | Freq: Three times a day (TID) | ORAL | 1 refills | Status: DC | PRN

## 2022-02-05 MED ORDER — ACETAMINOPHEN 500 MG PO TABS: 1000.0000 mg | ORAL_TABLET | Freq: Three times a day (TID) | ORAL | 0 refills | Status: AC

## 2022-02-05 NOTE — Discharge Instructions (Signed)
EATING AFTER YOUR ESOPHAGEAL SURGERY (Stomach Fundoplication, Hiatal Hernia repair, Achalasia surgery, etc)  ######################################################################  EAT Start with a full liquid diet (see below) Gradually transition to a high fiber diet with a fiber supplement over the next month after discharge.    WALK Walk an hour a day.  Control your pain to do that.    CONTROL PAIN Control pain so that you can walk, sleep, tolerate sneezing/coughing, go up/down stairs.  HAVE A BOWEL MOVEMENT DAILY Keep your bowels regular to avoid problems.  OK to try a laxative to override constipation.  OK to use an antidairrheal to slow down diarrhea.  Call if not better after 2 tries  CALL IF YOU HAVE PROBLEMS/CONCERNS Call if you are still struggling despite following these instructions. Call if you have concerns not answered by these instructions  WOUND CARE Remove clear bandages today. White strips will fall off over time. May Shower.  Old Right chest tube site- change bandage daily. May have some scant drainage from that open incision. Cover with dry gauze and some tape and change daily  ######################################################################   After your esophageal surgery, expect some sticking with swallowing over the next 1-2 months.    If food sticks when you eat, it is called "dysphagia".  This is due to swelling around your esophagus at the wrap & hiatal diaphragm repair.  It will gradually ease off over the next few months.  To help you through this temporary phase, we start you out on a full liquid diet.  Your first meal in the hospital was thin liquids.  You should have been given a full liquid diet by the time you left the hospital. Stay on clears and full liquids for the first week. Some patients may need to stay on a liquid diet for up to 2 weeks if having trouble swallowing.  Once tolerating that well, you can advance to pureed diet.   We ask  patients to stay on a pureed diet for the 2nd-3rd week to avoid anything getting "stuck" near your recent surgery.  Don't be alarmed if your ability to swallow doesn't progress according to this plan.  Everyone is different and some diets can advance more or less quickly.    It is often helpful to crush your medications or split them as they can sometimes stick, especially the first week or so.   Some BASIC RULES to follow are: Maintain an upright position whenever eating or drinking. Take small bites - just a teaspoon size bite at a time. Eat slowly.  It may also help to eat only one food at a time. Consider nibbling through smaller, more frequent meals & avoid the urge to eat BIG meals Do not push through feelings of fullness, nausea, or bloatedness Do not mix solid foods and liquids in the same mouthful Try not to "wash foods down" with large gulps of liquids. Avoid carbonated (bubbly/fizzy) drinks.   Avoid foods that make you feel gassy or bloated.  Start with bland foods first.  Wait on trying greasy, fried, or spicy meals until you are tolerating more bland solids well. Understand that it will be hard to burp and belch at first.  This gradually improves with time.  Expect to be more gassy/flatulent/bloated initially.  Walking will help your body manage it better. Consider using medications for bloating that contain simethicone such as  Maalox or Gas-X  Consider crushing her medications, especially smaller pills.  The ability to swallow pills should get easier after  a few weeks Eat in a relaxed atmosphere & minimize distractions. Avoid talking while eating.   Do not use straws. Following each meal, sit in an upright position (90 degree angle) for 60 to 90 minutes.  Going for a short walk can help as well If food does stick, don't panic.  Try to relax and let the food pass on its own.  Sipping WARM LIQUID such as strong hot black tea can also help slide it down.   Be gradual in changes &  use common sense:  -If you easily tolerating a certain "level" of foods, advance to the next level gradually -If you are having trouble swallowing a particular food, then avoid it.   -If food is sticking when you advance your diet, go back to thinner previous diet (the lower LEVEL) for 1-2 days.  LEVEL 2 = PUREED DIET  Start 1- 2 WEEKS AFTER SURGERY IF YOU ARE TOLERATING A FULL LIQUID DIET EASILY  -Foods in this group are pureed or blenderized to a smooth, mashed potato-like consistency.  -If necessary, the pureed foods can keep their shape with the addition of a thickening agent.   -Meat should be pureed to a smooth, pasty consistency.  Hot broth or gravy may be added to the pureed meat, approximately 1 oz. of liquid per 3 oz. serving of meat. -CAUTION:  If any foods do not puree into a smooth consistency, swallowing will be more difficult.  (For example, nuts or seeds sometimes do not blend well.)  Hot Foods Cold Foods  Pureed scrambled eggs and cheese Pureed cottage cheese  Baby cereals Thickened juices and nectars  Thinned cooked cereals (no lumps) Thickened milk or eggnog  Pureed [place] toast or pancakes Ensure  Mashed potatoes Ice cream  Pureed parsley, au gratin, scalloped potatoes, candied sweet potatoes Fruit or [place] ice, sherbet  Pureed buttered or alfredo noodles Plain yogurt  Pureed vegetables (no corn or peas) Instant breakfast  Pureed soups and creamed soups Smooth pudding, mousse, custard  Pureed scalloped apples Whipped gelatin  Gravies Sugar, syrup, honey, jelly  Sauces, cheese, tomato, barbecue, white, creamed Cream  Any baby food Creamer  Alcohol in moderation (not beer or champagne) Margarine  Coffee or tea Mayonnaise   Ketchup, mustard   Apple sauce   SAMPLE MENU:  PUREED DIET Breakfast Lunch Dinner  Orange juice, 1/2 cup Cream of wheat, 1/2 cup Pineapple juice, 1/2 cup Pureed [place], barley soup, 3/4 cup Pureed Hawaiian chicken, 3 oz  Scrambled eggs,  mashed or blended with cheese, 1/2 cup Tea or coffee, 1 cup  Whole milk, 1 cup  Non-dairy creamer, 2 Tbsp. Mashed potatoes, 1/2 cup Pureed cooled broccoli, 1/2 cup Apple sauce, 1/2 cup Coffee or tea Mashed potatoes, 1/2 cup Pureed spinach, 1/2 cup Frozen yogurt, 1/2 cup Tea or coffee      LEVEL 3 = SOFT DIET  After your first 4 weeks, you can advance to a soft diet.   Keep on this diet until everything goes down easily.  Hot Foods Cold Foods  White fish Cottage cheese  Stuffed fish Junior baby fruit  Baby food meals Semi thickened juices  Minced soft cooked, scrambled, poached eggs nectars  Souffle & omelets Ripe mashed bananas  Cooked cereals Canned fruit, pineapple sauce, milk  potatoes Milkshake  Buttered or Alfredo noodles Custard  Cooked cooled vegetable Puddings, including tapioca  Sherbet Yogurt  Vegetable soup or alphabet soup Fruit ice, [place] ice  Gravies Whipped gelatin  Sugar, syrup, honey,  jelly Junior baby desserts  Sauces:  Cheese, creamed, barbecue, tomato, white Cream  Coffee or tea Margarine   SAMPLE MENU:  LEVEL 3 Breakfast Lunch Dinner  Orange juice, 1/2 cup Oatmeal, 1/2 cup Scrambled eggs with cheese, 1/2 cup Decaffeinated tea, 1 cup Whole milk, 1 cup Non-dairy creamer, 2 Tbsp Pineapple juice, 1/2 cup Minced beef, 3 oz Gravy, 2 Tbsp Mashed potatoes, 1/2 cup Minced fresh broccoli, 1/2 cup Applesauce, 1/2 cup Coffee, 1 cup [place], barley soup, 3/4 cup Minced Hawaiian chicken, 3 oz Mashed potatoes, 1/2 cup Cooked spinach, 1/2 cup Frozen yogurt, 1/2 cup Non-dairy creamer, 2 Tbsp      LEVEL 4 = CHOPPED DIET  -After all the foods in level 3 (soft diet) are passing through well you should advance up to more chopped foods.  -It is still important to cut these foods into small pieces and eat slowly.  Hot Foods Cold Foods  Poultry Cottage cheese  Chopped Swedish meatballs Yogurt  Meat salads (ground or flaked meat) Milk  Flaked fish  (tuna) Milkshakes  Poached or scrambled eggs Soft, cold, dry cereal  Souffles and omelets Fruit juices or nectars  Cooked cereals Chopped canned fruit  Chopped [place] toast or pancakes Canned fruit cocktail  Noodles or pasta (no rice) Pudding, mousse, custard  Cooked vegetables (no frozen peas, corn, or mixed vegetables) Green salad  Canned small sweet peas Ice cream  Creamed soup or vegetable soup Fruit ice, [place] ice  Pureed vegetable soup or alphabet soup Non-dairy creamer  Ground scalloped apples Margarine  Gravies Mayonnaise  Sauces:  Cheese, creamed, barbecue, tomato, white Ketchup  Coffee or tea Mustard   SAMPLE MENU:  LEVEL 4 Breakfast Lunch Dinner  Orange juice, 1/2 cup Oatmeal, 1/2 cup Scrambled eggs with cheese, 1/2 cup Decaffeinated tea, 1 cup Whole milk, 1 cup Non-dairy creamer, 2 Tbsp Ketchup, 1 Tbsp Margarine, 1 tsp Salt, 1/4 tsp Sugar, 2 tsp Pineapple juice, 1/2 cup Ground beef, 3 oz Gravy, 2 Tbsp Mashed potatoes, 1/2 cup Cooked spinach, 1/2 cup Applesauce, 1/2 cup Decaffeinated coffee Whole milk Non-dairy creamer, 2 Tbsp Margarine, 1 tsp Salt, 1/4 tsp Pureed [place], barley soup, 3/4 cup Barbecue chicken, 3 oz Mashed potatoes, 1/2 cup Ground fresh broccoli, 1/2 cup Frozen yogurt, 1/2 cup Decaffeinated tea, 1 cup Non-dairy creamer, 2 Tbsp Margarine, 1 tsp Salt, 1/4 tsp Sugar, 1 tsp    LEVEL 5:  REGULAR FOODS  -Foods in this group are soft, moist, regularly textured foods.   -This level includes meat and breads, which tend to be the hardest things to swallow.   -Eat very slowly, chew well and continue to avoid carbonated drinks. -most people are at this level in 6 weeks  Hot Foods Cold Foods  Baked fish or skinned Soft cheeses - cottage cheese  Souffles and omelets Cream cheese  Eggs Yogurt  Stuffed shells Milk  Spaghetti with meat sauce Milkshakes  Cooked cereal Cold dry cereals (no nuts, dried fruit, coconut)  [place] toast or pancakes  Crackers  Buttered toast Fruit juices or nectars  Noodles or pasta (no rice) Canned fruit  Potatoes (all types) Ripe bananas  Soft, cooked vegetables (no corn, lima, or baked beans) Peeled, ripe, fresh fruit  Creamed soups or vegetable soup Cakes (no nuts, dried fruit, coconut)  Canned chicken noodle soup Plain doughnuts  Gravies Ice cream  Bacon dressing Pudding, mousse, custard  Sauces:  Cheese, creamed, barbecue, tomato, white Fruit ice, [place] ice, sherbet  Decaffeinated tea or coffee Whipped gelatin  Pork chops Regular gelatin   Canned fruited gelatin molds   Sugar, syrup, honey, jam, jelly   Cream   Non-dairy   Margarine   Oil   Mayonnaise   Ketchup   Mustard   TROUBLESHOOTING IRREGULAR BOWELS  1) Avoid extremes of bowel movements (no bad constipation/diarrhea)  2) Miralax 17gm mixed in 8oz. water or juice-daily. May use BID as needed.  3) Gas-x,Phazyme, etc. as needed for gas & bloating.  4) Soft,bland diet. No spicy,greasy,fried foods.  5) Prilosec over-the-counter as needed  6) May hold gluten/wheat products from diet to see if symptoms improve.  7) May try probiotics (Align, Activa, etc) to help calm the bowels down  7) If symptoms become worse call back immediately.    If you have any questions please call our office at [place]: [phone].

## 2022-02-05 NOTE — Progress Notes (Signed)
  Transition of Care [facility]) Screening Note   Patient Details  Name: Toni Collins Date of Birth: 1966/05/20   Transition of Care [facility]) CM/SW Contact:    Dessa Phi, RN Phone Number: 02/05/2022, 10:12 AM    Transition of Care Department [facility]) has reviewed patient and no TOC needs have been identified at this time. We will continue to monitor patient advancement through interdisciplinary progression rounds. If new patient transition needs arise, please place a TOC consult.

## 2022-02-05 NOTE — Progress Notes (Signed)
Discharge instructions provided to and discussed with patient. All questions answered. IVs removed. Patient taken to main entrance via wheelchair by NT at 1245.

## 2022-02-05 NOTE — Discharge Summary (Signed)
Physician Discharge Summary  Toni Collins PIR:[id number] DOB: 09-Mar-1966 DOA: 02/02/2022  PCP: Eulas Post, MD  Admit date: 02/02/2022 Discharge date: 02/05/2022  Recommendations for Outpatient Follow-up:     Follow-up Information     Greer Pickerel, MD. Schedule an appointment as soon as possible for a visit in 3 day(s).   Specialty: General Surgery Why: For wound re-check Contact information: [place] [address] [phone]                Discharge Diagnoses:  Moderate type III hiatal hernia with gerd & esophagitis s/p repair 2. Right tension pneumothorax - resolved 3. Chronic pain syndrome 4. Chronic iron deficiency anemia  Surgical Procedure: Robotic assisted repair of hiatal hernia with toupet fundoplication,, upper endoscopy, placement of right 20 French chest tube September 8  Discharge Condition: Good Disposition: Home  Diet recommendation: Full liquid diet  Filed Weights   02/02/22 0739 02/02/22 1500  Weight: 77.7 kg 80.7 kg    History of present illness:  Patient was brought in for planned repair of her hiatal hernia with GERD with esophagitis.  Please see outside records for additional detail  Hospital Course:  Patient underwent robotic repair for moderate type III hiatal hernia with a toupet fundoplication.  Her hiatal hernia was chronically inflamed especially on the right side of her mediastinum.  The right parietal pleura was was entered while trying to mobilize her hiatal hernia and her esophagus in the mediastinum.  About 20 to 30 minutes later the patient had some oxygenation issues.  It was initially felt that the endotracheal tube might have been pulled back some during the upper endoscopy.  The endotracheal tube was replaced without any improvement in her oxygenation status.  It was felt that she had developed a tension pneumothorax on the right side.  A 20 French chest tube was placed during surgery with  immediate improvement in her oxygenation.  The patient was admitted to stepdown unit for observation after surgery.  A swallow study on postoperative day 1 showed no leak and normal expected anatomy after hiatal hernia repair with partial fundoplication.  The patient had been started on clear liquids and advance to full liquids.  Chest x-ray immediately after surgery showed resolution of the pneumothorax.  She was placed to waterseal on Saturday.  Follow-up chest x-ray on Sunday revealed no evidence of pneumothorax so her chest tube was removed.  On Monday, September 11 patient was tolerating a full liquid diet.  Her pain was adequately controlled.  Her vital signs were stable.  We discussed discharge diet instructions as well as wound care and other things to be aware of after surgery.  She was satting well on room air.  BP 133/78 (BP Location: Left Arm)   Pulse 89   Temp 97.9 F (36.6 C) (Oral)   Resp 17   Ht '5\' 2"'$  (1.575 m)   Wt 80.7 kg   SpO2 96%   BMI 32.54 kg/m   Gen: alert, NAD, non-toxic appearing Pupils: equal, no scleral icterus Pulm: Lungs clear to auscultation, symmetric chest rise CV: regular rate and rhythm Abd: soft, mild expected tenderness, mild distension. No cellulitis. No incisional hernia Ext: no edema, no calf tenderness Skin: no rash, no jaundice    Discharge Instructions  Discharge Instructions     Call MD for:   Complete by: As directed    Temperature >101   Call MD for:  hives   Complete by: As directed  Call MD for:  persistant dizziness or light-headedness   Complete by: As directed    Call MD for:  persistant nausea and vomiting   Complete by: As directed    Call MD for:  redness, tenderness, or signs of infection (pain, swelling, redness, odor or green/yellow discharge around incision site)   Complete by: As directed    Call MD for:  severe uncontrolled pain   Complete by: As directed    Diet full liquid   Complete by: As directed    Discharge  instructions   Complete by: As directed    See [facility] discharge instructions   Increase activity slowly   Complete by: As directed       Allergies as of 02/05/2022   No Known Allergies      Medication List     STOP taking these medications    amphetamine-dextroamphetamine 15 MG tablet Commonly known as: Adderall   esomeprazole 20 MG capsule Commonly known as: NEXIUM   esomeprazole 40 MG capsule Commonly known as: NexIUM Replaced by: pantoprazole 40 MG tablet   RABEprazole 20 MG tablet Commonly known as: ACIPHEX       TAKE these medications    acetaminophen 500 MG tablet Commonly known as: TYLENOL Take 2 tablets (1,000 mg total) by mouth every 8 (eight) hours for 5 days.   BIOFREEZE EX Apply 1 Application topically daily as needed (pain).   Cariprazine HCl 6 MG Caps Commonly known as: Vraylar Take 1 capsule (6 mg total) by mouth daily.   chlorproMAZINE 25 MG tablet Commonly known as: THORAZINE TAKE 3-4 TABLETS AT NIGHT FOR ANXIETY RELATED INSOMNIA + 2 TABLETS TWICE DAILY AS NEEDED FOR ANXIETY What changed:  how much to take how to take this when to take this additional instructions   ondansetron 4 MG disintegrating tablet Commonly known as: ZOFRAN-ODT Take 1 tablet (4 mg total) by mouth every 8 (eight) hours as needed for nausea or vomiting. Please keep your December appointment for further refills. Thank you   oxyCODONE 15 MG immediate release tablet Commonly known as: Roxicodone Take 1 tablet (15 mg total) by mouth every 6 (six) hours. May refill in two months What changed: Another medication with the same name was removed. Continue taking this medication, and follow the directions you see here.   pantoprazole 40 MG tablet Commonly known as: PROTONIX Take 1 tablet (40 mg total) by mouth daily. Replaces: esomeprazole 40 MG capsule   pregabalin 150 MG capsule Commonly known as: Lyrica Take 1 capsule by mouth every morning, 1 capsule at noon and 1  capsule every night at bedtime as directed by physician.   sertraline 100 MG tablet Commonly known as: ZOLOFT 2 in the AM What changed:  how much to take how to take this when to take this additional instructions        Follow-up Information     Greer Pickerel, MD. Schedule an appointment as soon as possible for a visit in 3 day(s).   Specialty: General Surgery Why: For wound re-check Contact information: [place] [address] [phone]                  The results of significant diagnostics from this hospitalization (including imaging, microbiology, ancillary and laboratory) are listed below for reference.    Significant Diagnostic Studies: DG Chest 2 View  Result Date: 02/04/2022 CLINICAL DATA:  Right pneumothorax EXAM: CHEST - 2 VIEW COMPARISON:  Previous studies including the  examination done earlier today FINDINGS: Transverse diameter of heart is increased. There is poor inspiration. There are linear densities in right upper and both lower lung fields. There is no focal consolidation. There is no significant pleural effusion or pneumothorax. IMPRESSION: There is no pneumothorax. There are linear densities in right upper and both lower lung fields suggesting subsegmental atelectasis. Electronically Signed   By: Elmer Picker M.D.   On: 02/04/2022 14:22   DG CHEST PORT 1 VIEW  Result Date: 02/04/2022 CLINICAL DATA:  Right chest tube removed.  Shortness of breath. EXAM: PORTABLE CHEST 1 VIEW COMPARISON:  Earlier same day. FINDINGS: Interval removal right-sided chest tube. Lungs are hypoinflated with minimal stable linear scarring/atelectasis right midlung. No evidence of lobar consolidation or effusion. No residual right-sided pneumothorax. Cardiomediastinal silhouette and remainder of the exam is unchanged. IMPRESSION: 1. Interval removal right chest tube. No residual right-sided pneumothorax. 2. Minimal stable linear scarring/atelectasis  right midlung. Electronically Signed   By: Marin Olp M.D.   On: 02/04/2022 09:41   DG Chest Port 1 View  Result Date: 02/04/2022 CLINICAL DATA:  Follow-up atelectasis and right-sided chest tube EXAM: PORTABLE CHEST 1 VIEW COMPARISON:  Prior chest x-ray yesterday, 02/03/2022 FINDINGS: Stable position of right-sided thoracostomy tube. Stable cardiomegaly. Lung volumes remain low with bibasilar atelectasis. The conspicuity of the linear atelectatic opacities is slightly improved compared to yesterday. No definite residual pneumothorax visualized. No large effusion. No acute osseous abnormality. IMPRESSION: 1. Stable position of right-sided chest tube. No evidence of residual pneumothorax. 2. Persistent low lung volumes with slightly improved bibasilar atelectasis. Electronically Signed   By: Jacqulynn Cadet M.D.   On: 02/04/2022 07:30   DG UGI W SINGLE CM (SOL OR THIN BA)  Addendum Date: 02/03/2022   ADDENDUM REPORT: 02/03/2022 13:43 ADDENDUM: Omnipaque was utilized. Electronically Signed   By: Margarette Canada M.D.   On: 02/03/2022 13:43   Result Date: 02/03/2022 CLINICAL DATA:  56 year old female status post hiatal hernia repair with partial wrap. EXAM: DG UGI W SINGLE CM TECHNIQUE: Scout radiograph was obtained. Single contrast examination was performed using water-soluble contrast. This exam was performed by Soyla Dryer, NP, and was supervised and interpreted by Dr. Hassan Rowan. FLUOROSCOPY: Radiation Exposure Index (as provided by the fluoroscopic device): 18 mGy Kerma COMPARISON:  Prior studies FINDINGS: Scalp film of the abdomen demonstrates gas-filled colon. No dilated small bowel loops are noted. Water-soluble contrast administered. The esophagus is unremarkable. Evidence of hiatal hernia repair/wrap noted without leak or complicating features. Contrast flows through the repair/wrap without high-grade obstruction. The remainder of the stomach is unremarkable. Contrast flows into the small bowel  without obstruction. IMPRESSION: Evidence of hiatal hernia repair/wrap without leak or complicating features. Electronically Signed: By: Margarette Canada M.D. On: 02/03/2022 13:40   DG CHEST PORT 1 VIEW  Result Date: 02/03/2022 CLINICAL DATA:  Hiatal hernia repair. EXAM: PORTABLE CHEST 1 VIEW COMPARISON:  02/02/2022 FINDINGS: Cardiomediastinal silhouette is unchanged. A RIGHT thoracostomy tube is again identified without evidence of pneumothorax. Bibasilar atelectasis again noted. No other significant changes. IMPRESSION: Unchanged exam with bibasilar atelectasis. RIGHT thoracostomy tube again noted without pneumothorax. Electronically Signed   By: Margarette Canada M.D.   On: 02/03/2022 08:16   DG CHEST PORT 1 VIEW  Result Date: 02/02/2022 CLINICAL DATA:  Post stop, RIGHT chest tube EXAM: PORTABLE CHEST 1 VIEW COMPARISON:  Portable exam 1313 hours without priors for comparison FINDINGS: RIGHT thoracostomy tube present. Enlargement of cardiac silhouette. Prominent mediastinal contours which may be in  part related to technique. Atelectasis versus consolidation in lower LEFT lung. Minimal subsegmental atelectasis RIGHT lung base. No pleural effusion or pneumothorax. IMPRESSION: RIGHT thoracostomy tube with minimal RIGHT basilar atelectasis. Atelectasis versus consolidation LEFT lower lobe. Electronically Signed   By: Lavonia Dana M.D.   On: 02/02/2022 14:06    Microbiology: Recent Results (from the past 240 hour(s))  MRSA Next Gen by PCR, Nasal     Status: None   Collection Time: 02/02/22  2:42 PM   Specimen: Nasal Mucosa; Nasal Swab  Result Value Ref Range Status   MRSA by PCR Next Gen NOT DETECTED NOT DETECTED Final    Comment: (NOTE) The GeneXpert MRSA Assay (FDA approved for NASAL specimens only), is one component of a comprehensive MRSA colonization surveillance program. It is not intended to diagnose MRSA infection nor to guide or monitor treatment for MRSA infections. Test performance is not FDA  approved in patients less than 63 years old. Performed at [facility], [place] [address] [place], [place] [address]      Labs: Basic Metabolic Panel: Recent Labs  Lab 02/03/22 0312 02/04/22 0015 02/05/22 0024  NA 142  --   --   K 3.7 4.3 4.1  CL 113*  --   --   CO2 22  --   --   GLUCOSE 123*  --   --   BUN 10  --   --   CREATININE 0.75 0.91 0.81  CALCIUM 8.8*  --   --   MG 2.2  --   --   PHOS 3.2  --   --    Liver Function Tests: No results for input(s): "AST", "ALT", "ALKPHOS", "BILITOT", "PROT", "ALBUMIN" in the last 168 hours. No results for input(s): "LIPASE", "AMYLASE" in the last 168 hours. No results for input(s): "AMMONIA" in the last 168 hours. CBC: Recent Labs  Lab 02/03/22 0312 02/04/22 0015 02/05/22 0024  WBC 7.6 4.6 5.1  HGB 9.0* 8.4* 8.8*  HCT 29.1* 27.7* 29.1*  MCV 73.7* 75.1* 74.8*  PLT 246 214 235   Cardiac Enzymes: No results for input(s): "CKTOTAL", "CKMB", "CKMBINDEX", "TROPONINI" in the last 168 hours. BNP: BNP (last 3 results) No results for input(s): "BNP" in the last 8760 hours.  ProBNP (last 3 results) No results for input(s): "PROBNP" in the last 8760 hours.  CBG: No results for input(s): "GLUCAP" in the last 168 hours.  Principal Problem:   History of repair of hiatal hernia   Time coordinating discharge: 25 min  Signed:  Gayland Curry, MD [facility], [place] [phone] 02/05/2022, 2:56 PM

## 2022-02-08 ENCOUNTER — Ambulatory Visit: Payer: Self-pay | Admitting: Psychiatry

## 2022-02-14 ENCOUNTER — Telehealth: Payer: Self-pay | Admitting: Psychiatry

## 2022-02-14 ENCOUNTER — Other Ambulatory Visit: Payer: Self-pay

## 2022-02-14 DIAGNOSIS — G894 Chronic pain syndrome: Secondary | ICD-10-CM

## 2022-02-14 MED ORDER — PREGABALIN 150 MG PO CAPS: ORAL_CAPSULE | ORAL | 0 refills | Status: DC

## 2022-02-14 NOTE — Telephone Encounter (Signed)
Toni Collins called this morning at 9:51 to request refill of her Lyrica.  Her pt. Assistance is no longer available so she will now get it at her local pharmacy [facility].  Appt 02/27/22

## 2022-02-14 NOTE — Telephone Encounter (Signed)
Rx sent 

## 2022-02-15 ENCOUNTER — Other Ambulatory Visit: Payer: Self-pay

## 2022-02-15 ENCOUNTER — Telehealth: Payer: Self-pay | Admitting: Psychiatry

## 2022-02-15 DIAGNOSIS — G894 Chronic pain syndrome: Secondary | ICD-10-CM

## 2022-02-15 MED ORDER — PREGABALIN 150 MG PO CAPS: ORAL_CAPSULE | ORAL | 0 refills | Status: DC

## 2022-02-15 NOTE — Telephone Encounter (Signed)
Pended.

## 2022-02-15 NOTE — Telephone Encounter (Signed)
Rx is controlled and it was not sent to a provider to approve, which is why it was printed.

## 2022-02-15 NOTE — Telephone Encounter (Signed)
Pt called @ 11:54a asking for refill of Lyrica.  Looks like the script was printed.  She said she needs it sent electronically.  Next appt 10/3

## 2022-02-16 ENCOUNTER — Other Ambulatory Visit: Payer: Self-pay | Admitting: Psychiatry

## 2022-02-16 ENCOUNTER — Telehealth: Payer: Self-pay

## 2022-02-16 DIAGNOSIS — F411 Generalized anxiety disorder: Secondary | ICD-10-CM

## 2022-02-16 DIAGNOSIS — F4310 Post-traumatic stress disorder, unspecified: Secondary | ICD-10-CM

## 2022-02-16 NOTE — Telephone Encounter (Signed)
Prior Approval received effective 02/02/2022-02/16/2023 for #270/90 day with ambetter

## 2022-02-16 NOTE — Telephone Encounter (Signed)
Prior Authorization submitted for PREGABALIN 150 MG CAP #270/90 day with [employer], pending response

## 2022-02-19 ENCOUNTER — Other Ambulatory Visit: Payer: Self-pay

## 2022-02-19 DIAGNOSIS — G894 Chronic pain syndrome: Secondary | ICD-10-CM

## 2022-02-19 MED ORDER — PREGABALIN 150 MG PO CAPS: ORAL_CAPSULE | ORAL | 0 refills | Status: DC

## 2022-02-19 NOTE — Telephone Encounter (Signed)
Due to pt cost with her insurance of PREGABALIN 150 MG capsule, she only picked up #[facility] capsules [facility] on [facility]22 and asking for a new Rx to be submitted to [facility].   [facility] cost with [facility] for #270 is $40.46.  Will pend Rx for #258

## 2022-02-20 ENCOUNTER — Other Ambulatory Visit: Payer: Self-pay

## 2022-02-20 DIAGNOSIS — G894 Chronic pain syndrome: Secondary | ICD-10-CM

## 2022-02-21 ENCOUNTER — Ambulatory Visit: Payer: No Typology Code available for payment source | Admitting: Family Medicine

## 2022-02-21 ENCOUNTER — Encounter: Payer: Self-pay | Admitting: Family Medicine

## 2022-02-21 VITALS — BP 126/86 | HR 95 | Temp 97.7°F | Ht 62.0 in | Wt 169.5 lb

## 2022-02-21 DIAGNOSIS — G894 Chronic pain syndrome: Secondary | ICD-10-CM

## 2022-02-21 DIAGNOSIS — D508 Other iron deficiency anemias: Secondary | ICD-10-CM | POA: Diagnosis not present

## 2022-02-21 DIAGNOSIS — K2100 Gastro-esophageal reflux disease with esophagitis, without bleeding: Secondary | ICD-10-CM | POA: Diagnosis not present

## 2022-02-21 NOTE — Patient Instructions (Signed)
Consider physical at some point this year- if that is possible with your insurance  Remember to get flu vaccine at pharmacy.

## 2022-02-21 NOTE — Progress Notes (Unsigned)
Established Patient Office Visit  Subjective   Patient ID: SAUMYA HUKILL, female    DOB: 12-19-1965  Age: 56 y.o. MRN: [id number]  No chief complaint on file.   HPI  {History (Optional):23778} Show seen for follow-up regarding recent hospital discharge and for chronic pain management.  She has history of hiatal hernia with severe GERD and esophagitis issues.  She underwent robotic assisted repair of hiatal hernia with fundoplication.  Her surgery was complicated by a right tension pneumothorax which resolved with chest tube.  She feels like she is recovered fairly well.  Her diet is advancing.  She has history of iron deficiency and is seen GI for evaluation previously.  She had several iron transfusions and most recent ferritin 42.  Hemoglobin at discharge 8.8.  Denies any dizziness.  She has chronic pain syndrome.  Reported fibromyalgia.  Chronic back pain.  Has been on oxycodone for years.  She has expressed interest in trying to taper off soon to see how she does.  She is very frustrated with mechanics of going through refills.  She did have some recent increased postoperative pain and took a couple extra tablets and requesting refill this Friday which would be just a couple days earlier than her scheduled refill.  She has right shoulder pain and is waiting for surgery next month for that.  Past Medical History:  Diagnosis Date   ADHD    Anemia    Anxiety    Arthritis    Colon polyps    DEPRESSION 09/02/2008   Fibromyalgia    GERD (gastroesophageal reflux disease)    GRIEF REACTION 09/30/2009   INSOMNIA, CHRONIC 09/02/2008   PREDIABETES 03/10/2009   PTSD (post-traumatic stress disorder)    Past Surgical History:  Procedure Laterality Date   ABDOMINAL HYSTERECTOMY  05/28/2000   TAH   COLONOSCOPY     DIAGNOSTIC LAPAROSCOPY  1993, 1994, 1998, 2000   x4   TMJ ARTHROPLASTY     x2   UPPER GASTROINTESTINAL ENDOSCOPY     UPPER GI ENDOSCOPY N/A 02/02/2022   Procedure:  UPPER GI ENDOSCOPY;  Surgeon: Greer Pickerel, MD;  Location: [facility];  Service: General;  Laterality: N/A;   XI ROBOTIC ASSISTED HIATAL HERNIA REPAIR N/A 02/02/2022   Procedure: XI ROBOTIC ASSISTED HIATAL HERNIA REPAIR WITH PARTAL WRAP, INSERTION OF CHEST TUBE;  Surgeon: Greer Pickerel, MD;  Location: [facility];  Service: General;  Laterality: N/A;    reports that she has never smoked. She has never used smokeless tobacco. She reports that she does not drink alcohol and does not use drugs. family history includes Arthritis in her mother; Colon polyps in her father and sister; Diabetes in her maternal aunt and maternal grandmother; Lung disease in her father. No Known Allergies  Review of Systems  Constitutional:  Negative for chills, fever and malaise/fatigue.  Eyes:  Negative for blurred vision.  Respiratory:  Negative for shortness of breath.   Cardiovascular:  Negative for chest pain.  Neurological:  Negative for dizziness, weakness and headaches.      Objective:     BP 126/86   Pulse 95   Temp 97.7 F (36.5 C) (Oral)   Ht '5\' 2"'$  (1.575 m)   Wt 169 lb 8 oz (76.9 kg)   SpO2 98%   BMI 31.00 kg/m  {Vitals History (Optional):23777}  Physical Exam Constitutional:      Appearance: She is well-developed.  Eyes:     Pupils: Pupils are equal, round, and reactive to  light.  Neck:     Thyroid: No thyromegaly.     Vascular: No JVD.  Cardiovascular:     Rate and Rhythm: Normal rate and regular rhythm.     Heart sounds:     No gallop.  Pulmonary:     Effort: Pulmonary effort is normal. No respiratory distress.     Breath sounds: Normal breath sounds. No wheezing or rales.  Musculoskeletal:     Cervical back: Neck supple.  Neurological:     Mental Status: She is alert.      No results found for any visits on 02/21/22.  {Labs (Optional):23779}  The ASCVD Risk score (Arnett DK, et al., 2019) failed to calculate for the following reasons:   Cannot find a previous HDL lab   Cannot  find a previous total cholesterol lab    Assessment & Plan:   #1 history of GERD with severe reflux esophagitis status post recent robotic assisted fundoplication.  She has done well in recovery.  GERD symptoms are controlled currently and she remains on pantoprazole.  Continue close follow-up with GI and surgery  #2 chronic pain syndrome.  Chronic opioid use.  Recently took a couple extra tablets because of her postoperative pain.  We agreed to refill this Friday  We have encouraged her to set up complete physical soon.  She is overdue for several health maintenance items.  She states our group currently does not accept her insurance and she is looking at trying to get insurance change first before scheduling physical.  No follow-ups on file.    Carolann Littler, MD

## 2022-02-22 ENCOUNTER — Encounter: Payer: Self-pay | Admitting: Family Medicine

## 2022-02-22 MED ORDER — OXYCODONE HCL 15 MG PO TABS: 15.0000 mg | ORAL_TABLET | Freq: Four times a day (QID) | ORAL | 0 refills | Status: DC

## 2022-02-22 MED ORDER — OXYCODONE HCL 15 MG PO TABS
15.0000 mg | ORAL_TABLET | Freq: Four times a day (QID) | ORAL | 0 refills | Status: DC
Start: 2022-02-22 — End: 2022-06-20

## 2022-02-23 ENCOUNTER — Ambulatory Visit: Payer: No Typology Code available for payment source | Admitting: Family Medicine

## 2022-02-27 ENCOUNTER — Encounter: Payer: Self-pay | Admitting: Psychiatry

## 2022-02-27 ENCOUNTER — Ambulatory Visit (INDEPENDENT_AMBULATORY_CARE_PROVIDER_SITE_OTHER): Payer: Self-pay | Admitting: Psychiatry

## 2022-02-27 DIAGNOSIS — F411 Generalized anxiety disorder: Secondary | ICD-10-CM

## 2022-02-27 DIAGNOSIS — G894 Chronic pain syndrome: Secondary | ICD-10-CM

## 2022-02-27 DIAGNOSIS — F39 Unspecified mood [affective] disorder: Secondary | ICD-10-CM

## 2022-02-27 DIAGNOSIS — F4310 Post-traumatic stress disorder, unspecified: Secondary | ICD-10-CM

## 2022-02-27 DIAGNOSIS — F902 Attention-deficit hyperactivity disorder, combined type: Secondary | ICD-10-CM

## 2022-02-27 DIAGNOSIS — F5105 Insomnia due to other mental disorder: Secondary | ICD-10-CM

## 2022-02-27 MED ORDER — OLANZAPINE 10 MG PO TBDP: 10.0000 mg | ORAL_TABLET | Freq: Every day | ORAL | 1 refills | Status: DC
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
## 2022-03-19 ENCOUNTER — Encounter: Payer: Self-pay | Admitting: Psychiatry

## 2022-03-19 ENCOUNTER — Ambulatory Visit (INDEPENDENT_AMBULATORY_CARE_PROVIDER_SITE_OTHER): Payer: Self-pay | Admitting: Psychiatry

## 2022-03-19 DIAGNOSIS — F902 Attention-deficit hyperactivity disorder, combined type: Secondary | ICD-10-CM

## 2022-03-19 DIAGNOSIS — F39 Unspecified mood [affective] disorder: Secondary | ICD-10-CM

## 2022-03-19 DIAGNOSIS — F411 Generalized anxiety disorder: Secondary | ICD-10-CM

## 2022-03-19 DIAGNOSIS — F5105 Insomnia due to other mental disorder: Secondary | ICD-10-CM

## 2022-03-19 DIAGNOSIS — F4310 Post-traumatic stress disorder, unspecified: Secondary | ICD-10-CM

## 2022-03-19 DIAGNOSIS — G894 Chronic pain syndrome: Secondary | ICD-10-CM

## 2022-03-19 MED ORDER — OLANZAPINE 20 MG PO TABS: 30.0000 mg | ORAL_TABLET | Freq: Every day | ORAL | 2 refills | Status: DC
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
## 2022-04-03 ENCOUNTER — Encounter: Payer: Self-pay | Admitting: Family Medicine

## 2022-04-04 ENCOUNTER — Ambulatory Visit: Payer: No Typology Code available for payment source | Admitting: Family Medicine

## 2022-04-10 ENCOUNTER — Other Ambulatory Visit: Payer: Self-pay | Admitting: Psychiatry

## 2022-04-10 DIAGNOSIS — F4310 Post-traumatic stress disorder, unspecified: Secondary | ICD-10-CM

## 2022-04-10 DIAGNOSIS — F39 Unspecified mood [affective] disorder: Secondary | ICD-10-CM

## 2022-05-14 ENCOUNTER — Other Ambulatory Visit: Payer: Self-pay | Admitting: Psychiatry

## 2022-05-14 ENCOUNTER — Telehealth: Payer: Self-pay | Admitting: Psychiatry

## 2022-05-14 DIAGNOSIS — G894 Chronic pain syndrome: Secondary | ICD-10-CM

## 2022-05-14 MED ORDER — PREGABALIN 150 MG PO CAPS: ORAL_CAPSULE | ORAL | 0 refills | Status: DC

## 2022-05-14 NOTE — Telephone Encounter (Signed)
Toni Collins called this morning at 10:05 to report that the last time she picked up her Lyrica they did not the full amount available so she is out early.  Please send in a new prescription.  [place], Appt 05/31/22

## 2022-05-14 NOTE — Telephone Encounter (Signed)
Filled 11/25 for 72 tablets

## 2022-05-15 NOTE — Telephone Encounter (Signed)
Toni Collins is on 3/day so a full 30 day supply would be #90.  #72 doesn't get her through a full month.  If shse only 72 she is now due for a refill.

## 2022-05-15 NOTE — Telephone Encounter (Signed)
Dr cottle sent the rx yesterday I was just letting him know the fill date and quantity

## 2022-05-31 ENCOUNTER — Encounter: Payer: Self-pay | Admitting: Psychiatry

## 2022-05-31 ENCOUNTER — Ambulatory Visit (INDEPENDENT_AMBULATORY_CARE_PROVIDER_SITE_OTHER): Payer: Medicare Other | Admitting: Psychiatry

## 2022-05-31 DIAGNOSIS — F422 Mixed obsessional thoughts and acts: Secondary | ICD-10-CM

## 2022-05-31 DIAGNOSIS — F39 Unspecified mood [affective] disorder: Secondary | ICD-10-CM

## 2022-05-31 DIAGNOSIS — F411 Generalized anxiety disorder: Secondary | ICD-10-CM

## 2022-05-31 DIAGNOSIS — F4310 Post-traumatic stress disorder, unspecified: Secondary | ICD-10-CM

## 2022-05-31 DIAGNOSIS — F902 Attention-deficit hyperactivity disorder, combined type: Secondary | ICD-10-CM

## 2022-05-31 DIAGNOSIS — F5105 Insomnia due to other mental disorder: Secondary | ICD-10-CM

## 2022-05-31 MED ORDER — FLUVOXAMINE MALEATE 100 MG PO TABS: ORAL_TABLET | ORAL | 1 refills | Status: DC
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
## 2022-05-31 NOTE — Patient Instructions (Addendum)
Start fluvoxamine 1 at night and reduce sertraline to 2 daily for 1 week, Then increase fluvoxamine to 1 tablet twice daily and reduce sertraline to 1 daily for 1 week, Then increase fluvoxamine to 1 in the morning and 2 at night and stop sertraline  Can [employer] Cure OCD, [name]

## 2022-06-08 ENCOUNTER — Encounter: Payer: Self-pay | Admitting: Family Medicine

## 2022-06-08 ENCOUNTER — Ambulatory Visit: Payer: No Typology Code available for payment source | Admitting: Family Medicine

## 2022-06-08 VITALS — BP 124/76 | HR 85 | Temp 98.1°F | Ht 62.0 in | Wt 189.4 lb

## 2022-06-08 DIAGNOSIS — M25511 Pain in right shoulder: Secondary | ICD-10-CM | POA: Diagnosis not present

## 2022-06-08 DIAGNOSIS — D509 Iron deficiency anemia, unspecified: Secondary | ICD-10-CM | POA: Diagnosis not present

## 2022-06-08 DIAGNOSIS — G8918 Other acute postprocedural pain: Secondary | ICD-10-CM | POA: Diagnosis not present

## 2022-06-08 MED ORDER — NALOXONE HCL 4 MG/0.1ML NA LIQD: NASAL | 1 refills | Status: AC

## 2022-06-08 MED ORDER — OXYCODONE HCL 5 MG PO CAPS: 5.0000 mg | ORAL_CAPSULE | Freq: Four times a day (QID) | ORAL | 0 refills | Status: DC | PRN

## 2022-06-08 NOTE — Telephone Encounter (Signed)
I spoke with the patient and informed her of the message below. Patient reported she was not aware she could pay out of pocket and she will purchase this medication.

## 2022-06-08 NOTE — Progress Notes (Signed)
Established Patient Office Visit  Subjective   Patient ID: Toni Collins, female    DOB: 16-Apr-1966  Age: 57 y.o. MRN: [id number]  Chief Complaint  Patient presents with   Post-op Follow-up    HPI   Toni Collins is seen following recent right shoulder surgery.  She states she is in tremendous pain-involving the right shoulder.  She had declined additional pain medications from her surgeon because of her contract with pain medication here.  She had surgery on Tuesday for right rotator cuff repair.  She actually had to go back to the ER and was her observed overnight following some shortness of breath afterwards.  She had CT scan which showed no pulmonary embolus.  It was felt the combination of anesthesia in addition to her chronic opioid may have induced some dyspnea.  Her hemoglobin in follow-up was 8.4.  She had recent ferritin of 7.  She apparently was given 1 iron transfusion during her recent hospitalization Tuesday night.  Denies any cough.  No pleuritic pain.  No chest pain.  She states her right shoulder pain since surgery is frequently 8-9 out of 10.  She at baseline is on oxycodone 15 mg 4 times daily and is having tremendous pain because of her recent surgery   she is in right shoulder immobilizer.  She is in process of trying to get follow-up with surgery for her procedure on Tuesday  Past Medical History:  Diagnosis Date   ADHD    Anemia    Anxiety    Arthritis    Colon polyps    DEPRESSION 09/02/2008   Fibromyalgia    GERD (gastroesophageal reflux disease)    GRIEF REACTION 09/30/2009   INSOMNIA, CHRONIC 09/02/2008   PREDIABETES 03/10/2009   PTSD (post-traumatic stress disorder)    Past Surgical History:  Procedure Laterality Date   ABDOMINAL HYSTERECTOMY  05/28/2000   TAH   COLONOSCOPY     DIAGNOSTIC LAPAROSCOPY  1993, 1994, 1998, 2000   x4   TMJ ARTHROPLASTY     x2   UPPER GASTROINTESTINAL ENDOSCOPY     UPPER GI ENDOSCOPY N/A 02/02/2022   Procedure: UPPER  GI ENDOSCOPY;  Surgeon: Greer Pickerel, MD;  Location: [facility];  Service: General;  Laterality: N/A;   XI ROBOTIC ASSISTED HIATAL HERNIA REPAIR N/A 02/02/2022   Procedure: XI ROBOTIC ASSISTED HIATAL HERNIA REPAIR WITH PARTAL WRAP, INSERTION OF CHEST TUBE;  Surgeon: Greer Pickerel, MD;  Location: [facility];  Service: General;  Laterality: N/A;    reports that she has never smoked. She has never used smokeless tobacco. She reports that she does not drink alcohol and does not use drugs. family history includes Arthritis in her mother; Colon polyps in her father and sister; Diabetes in her maternal aunt and maternal grandmother; Lung disease in her father. No Known Allergies  Review of Systems  Constitutional:  Negative for chills and fever.  Respiratory:  Negative for cough, sputum production and shortness of breath.   Cardiovascular:  Negative for chest pain.      Objective:     BP 124/76 (BP Location: Left Arm, Patient Position: Sitting, Cuff Size: Normal)   Pulse 85   Temp 98.1 F (36.7 C) (Oral)   Ht '5\' 2"'$  (1.575 m)   Wt 189 lb 6.4 oz (85.9 kg)   SpO2 98%   BMI 34.64 kg/m  BP Readings from Last 3 Encounters:  06/08/22 124/76  02/21/22 126/86  02/05/22 133/78   Wt Readings from Last 3  Encounters:  06/08/22 189 lb 6.4 oz (85.9 kg)  02/21/22 169 lb 8 oz (76.9 kg)  02/02/22 177 lb 14.6 oz (80.7 kg)      Physical Exam Vitals reviewed.  Constitutional:      Appearance: Normal appearance.  Cardiovascular:     Rate and Rhythm: Normal rate and regular rhythm.  Pulmonary:     Effort: Pulmonary effort is normal.     Breath sounds: Normal breath sounds. No wheezing or rales.  Musculoskeletal:     Comments: Right shoulder is bandaged.  She is in right shoulder immobilizer  Neurological:     Mental Status: She is alert.      No results found for any visits on 06/08/22.  Last CBC Lab Results  Component Value Date   WBC 5.1 02/05/2022   HGB 8.8 (L) 02/05/2022   HCT 29.1 (L)  02/05/2022   MCV 74.8 (L) 02/05/2022   MCH 22.6 (L) 02/05/2022   RDW 20.9 (H) 02/05/2022   PLT 235 35/00/9381   Last metabolic panel Lab Results  Component Value Date   GLUCOSE 123 (H) 02/03/2022   NA 142 02/03/2022   K 4.1 02/05/2022   CL 113 (H) 02/03/2022   CO2 22 02/03/2022   BUN 10 02/03/2022   CREATININE 0.81 02/05/2022   GFRNONAA >60 02/05/2022   CALCIUM 8.8 (L) 02/03/2022   PHOS 3.2 02/03/2022   PROT 7.4 01/16/2022   ALBUMIN 3.9 01/16/2022   BILITOT 0.4 01/16/2022   ALKPHOS 89 01/16/2022   AST 18 01/16/2022   ALT 18 01/16/2022   ANIONGAP 7 02/03/2022      The ASCVD Risk score (Arnett DK, et al., 2019) failed to calculate for the following reasons:   Cannot find a previous HDL lab   Cannot find a previous total cholesterol lab    Assessment & Plan:   #1 acute on chronic pain related to recent right shoulder surgery.  She had rotator cuff repair.  We did discuss her drug contract.  We explained in the setting of emergency such as acute injury or following surgery would not be a problem if she got short-term pain medication from surgeon-but we appreciate her diligence in checking with [place] first.. We have added low-dose oxycodone 5 mg 1 every 6 hours as needed for severe pain.  Continue close follow-up with her orthopedic surgeon.  We also wrote for Narcan nasal spray 1 actuation per nostril for any respiratory depression.  Avoid benzodiazepines.  Avoid sedative hypnotics.  She does not use any alcohol and knows to avoid  #2 chronic iron deficiency anemia.  She had severe esophagitis in the past it was felt initially her anemia was related to that.  She is probably had some worsening of anemia following surgery.  Recent iron infusion as above.  We wrote for future labs for CBC and iron studies to repeat in about a month.  If ferritin still low at that time consider further iron infusions.   Carolann Littler, MD

## 2022-06-12 ENCOUNTER — Encounter: Payer: Self-pay | Admitting: Family Medicine

## 2022-06-12 ENCOUNTER — Ambulatory Visit: Payer: No Typology Code available for payment source | Admitting: Family Medicine

## 2022-06-12 VITALS — BP 130/80 | HR 76 | Temp 98.0°F | Ht 62.0 in | Wt 182.9 lb

## 2022-06-12 DIAGNOSIS — G894 Chronic pain syndrome: Secondary | ICD-10-CM

## 2022-06-12 DIAGNOSIS — M25511 Pain in right shoulder: Secondary | ICD-10-CM | POA: Diagnosis not present

## 2022-06-12 MED ORDER — OXYCODONE HCL ER 10 MG PO T12A: 10.0000 mg | EXTENDED_RELEASE_TABLET | Freq: Two times a day (BID) | ORAL | 0 refills | Status: DC

## 2022-06-12 NOTE — Progress Notes (Signed)
Established Patient Office Visit  Subjective   Patient ID: Toni Collins, female    DOB: 1965-07-15  Age: 57 y.o. MRN: [id number]  Chief Complaint  Patient presents with   Follow-up    HPI   Encarnacion is seen for increased postoperative pain following rotator cuff repair.  Her surgery was last Tuesday.  She had declined additional pain medication per surgeon because of her chronic pain management contract.  Her pain remains frequently 8-9 out of 10.  At baseline she is on oxycodone 15 mg 4 times daily. We had for written for Oxy codon immediate release 5 mg 1 every 6 hours as needed for severe breakthrough pain but she was told at pharmacy that she cannot get to immediate release preparations.  Denies further chest pains.  No pleuritic pain.  No fever.  Past Medical History:  Diagnosis Date   ADHD    Anemia    Anxiety    Arthritis    Colon polyps    DEPRESSION 09/02/2008   Fibromyalgia    GERD (gastroesophageal reflux disease)    GRIEF REACTION 09/30/2009   INSOMNIA, CHRONIC 09/02/2008   PREDIABETES 03/10/2009   PTSD (post-traumatic stress disorder)    Past Surgical History:  Procedure Laterality Date   ABDOMINAL HYSTERECTOMY  05/28/2000   TAH   COLONOSCOPY     DIAGNOSTIC LAPAROSCOPY  1993, 1994, 1998, 2000   x4   TMJ ARTHROPLASTY     x2   UPPER GASTROINTESTINAL ENDOSCOPY     UPPER GI ENDOSCOPY N/A 02/02/2022   Procedure: UPPER GI ENDOSCOPY;  Surgeon: Greer Pickerel, MD;  Location: [facility];  Service: General;  Laterality: N/A;   XI ROBOTIC ASSISTED HIATAL HERNIA REPAIR N/A 02/02/2022   Procedure: XI ROBOTIC ASSISTED HIATAL HERNIA REPAIR WITH PARTAL WRAP, INSERTION OF CHEST TUBE;  Surgeon: Greer Pickerel, MD;  Location: [facility];  Service: General;  Laterality: N/A;    reports that she has never smoked. She has never used smokeless tobacco. She reports that she does not drink alcohol and does not use drugs. family history includes Arthritis in her mother; Colon polyps in  her father and sister; Diabetes in her maternal aunt and maternal grandmother; Lung disease in her father. No Known Allergies   Review of Systems  Constitutional:  Negative for chills and fever.  Respiratory:  Negative for cough, hemoptysis and shortness of breath.   Cardiovascular:  Negative for chest pain.      Objective:     BP 130/80 (BP Location: Left Arm, Patient Position: Sitting, Cuff Size: Normal)   Pulse 76   Temp 98 F (36.7 C) (Oral)   Ht '5\' 2"'$  (1.575 m)   Wt 182 lb 14.4 oz (83 kg)   SpO2 98%   BMI 33.45 kg/m    Physical Exam Vitals reviewed.  Cardiovascular:     Rate and Rhythm: Normal rate and regular rhythm.  Pulmonary:     Effort: Pulmonary effort is normal.     Breath sounds: Normal breath sounds. No wheezing or rales.  Musculoskeletal:     Comments: Right shoulder is in immobilizer.  Neurological:     Mental Status: She is alert.      No results found for any visits on 06/12/22.    The ASCVD Risk score (Arnett DK, et al., 2019) failed to calculate for the following reasons:   Cannot find a previous HDL lab   Cannot find a previous total cholesterol lab    Assessment & Plan:  Acute on chronic pain.  She has chronic pain syndrome with chronic back pain and now recent rotator cuff repair. -We recommended follow-up with surgeon especially if pain not improving over the next week to 10 days. -Wrote for short-term use of oxycodone 10 mg every 12 hours to supplement for breakthrough pain --She does not to mix with alcohol, benzodiazepines, or sedative hypnotics  Carolann Littler, MD

## 2022-06-13 ENCOUNTER — Other Ambulatory Visit: Payer: Self-pay | Admitting: Psychiatry

## 2022-06-13 ENCOUNTER — Encounter: Payer: Self-pay | Admitting: Family Medicine

## 2022-06-13 DIAGNOSIS — F411 Generalized anxiety disorder: Secondary | ICD-10-CM

## 2022-06-13 DIAGNOSIS — F4310 Post-traumatic stress disorder, unspecified: Secondary | ICD-10-CM

## 2022-06-15 ENCOUNTER — Other Ambulatory Visit (HOSPITAL_COMMUNITY): Payer: Self-pay

## 2022-06-15 ENCOUNTER — Encounter: Payer: Self-pay | Admitting: Family Medicine

## 2022-06-15 ENCOUNTER — Telehealth: Payer: Self-pay

## 2022-06-15 NOTE — Telephone Encounter (Signed)
Pharmacy Patient Advocate Encounter   Received notification from [facility] that prior authorization for oxyCODONE HCl ER '10MG'$  er tablets is required/requested.   PA submitted on 06/15/22 to (ins) [employer] via CoverMyMeds Key [id number] Status is pending

## 2022-06-15 NOTE — Telephone Encounter (Signed)
Noted  

## 2022-06-18 ENCOUNTER — Other Ambulatory Visit: Payer: Self-pay

## 2022-06-18 ENCOUNTER — Emergency Department (HOSPITAL_COMMUNITY): Payer: No Typology Code available for payment source

## 2022-06-18 ENCOUNTER — Emergency Department (HOSPITAL_COMMUNITY)
Admission: EM | Admit: 2022-06-18 | Discharge: 2022-06-18 | Disposition: A | Discharge status: Home / Self Care | Payer: No Typology Code available for payment source | Attending: Emergency Medicine | Admitting: Emergency Medicine

## 2022-06-18 ENCOUNTER — Other Ambulatory Visit (HOSPITAL_COMMUNITY): Payer: Self-pay

## 2022-06-18 ENCOUNTER — Encounter (HOSPITAL_COMMUNITY): Payer: Self-pay | Admitting: Radiology

## 2022-06-18 DIAGNOSIS — R1084 Generalized abdominal pain: Secondary | ICD-10-CM | POA: Diagnosis not present

## 2022-06-18 DIAGNOSIS — K219 Gastro-esophageal reflux disease without esophagitis: Secondary | ICD-10-CM | POA: Insufficient documentation

## 2022-06-18 LAB — URINALYSIS, ROUTINE W REFLEX MICROSCOPIC
Bilirubin Urine: NEGATIVE
Glucose, UA: NEGATIVE mg/dL
Ketones, ur: NEGATIVE mg/dL
Leukocytes,Ua: NEGATIVE
Nitrite: NEGATIVE
Protein, ur: 30 mg/dL — AB
Specific Gravity, Urine: 1.02 (ref 1.005–1.030)
pH: 7 (ref 5.0–8.0)

## 2022-06-18 LAB — COMPREHENSIVE METABOLIC PANEL
ALT: 20 U/L (ref 0–44)
AST: 22 U/L (ref 15–41)
Albumin: 4.8 g/dL (ref 3.5–5.0)
Alkaline Phosphatase: 102 U/L (ref 38–126)
Anion gap: 14 (ref 5–15)
BUN: 15 mg/dL (ref 6–20)
CO2: 17 mmol/L — ABNORMAL LOW (ref 22–32)
Calcium: 9.9 mg/dL (ref 8.9–10.3)
Chloride: 108 mmol/L (ref 98–111)
Creatinine, Ser: 0.79 mg/dL (ref 0.44–1.00)
GFR, Estimated: >60 mL/min (ref >60)
Glucose, Bld: 111 mg/dL — ABNORMAL HIGH (ref 70–99)
Potassium: 3.6 mmol/L (ref 3.5–5.1)
Sodium: 139 mmol/L (ref 135–145)
Total Bilirubin: 0.5 mg/dL (ref 0.3–1.2)
Total Protein: 9 g/dL — ABNORMAL HIGH (ref 6.5–8.1)

## 2022-06-18 LAB — CBC
HCT: 40.5 % (ref 36.0–46.0)
Hemoglobin: 13 g/dL (ref 12.0–15.0)
MCH: 23.2 pg — ABNORMAL LOW (ref 26.0–34.0)
MCHC: 32.1 g/dL (ref 30.0–36.0)
MCV: 72.3 fL — ABNORMAL LOW (ref 80.0–100.0)
Platelets: 297 10*3/uL (ref 150–400)
RBC: 5.6 MIL/uL — ABNORMAL HIGH (ref 3.87–5.11)
RDW: 20.9 % — ABNORMAL HIGH (ref 11.5–15.5)
WBC: 7.9 10*3/uL (ref 4.0–10.5)
nRBC: 0 % (ref 0.0–0.2)

## 2022-06-18 MED ORDER — IOHEXOL 300 MG/ML  SOLN
100.0000 mL | Freq: Once | INTRAMUSCULAR | Status: AC | PRN
  Administered 2022-06-18: 100 mL via INTRAVENOUS

## 2022-06-18 MED ORDER — HYDROMORPHONE HCL 1 MG/ML IJ SOLN
0.5000 mg | Freq: Once | INTRAMUSCULAR | Status: AC
  Administered 2022-06-18: 0.5 mg via INTRAVENOUS
  Filled 2022-06-18: qty 1

## 2022-06-18 MED ORDER — LORAZEPAM 1 MG PO TABS
1.0000 mg | ORAL_TABLET | Freq: Once | ORAL | Status: AC
  Administered 2022-06-18: 1 mg via ORAL
  Filled 2022-06-18: qty 1

## 2022-06-18 MED ORDER — ALUM & MAG HYDROXIDE-SIMETH 200-200-20 MG/5ML PO SUSP
30.0000 mL | Freq: Once | ORAL | Status: AC
  Administered 2022-06-18: 30 mL via ORAL
  Filled 2022-06-18: qty 30

## 2022-06-18 MED ORDER — SODIUM CHLORIDE (PF) 0.9 % IJ SOLN
INTRAMUSCULAR | Status: AC
  Filled 2022-06-18: qty 50

## 2022-06-18 NOTE — ED Triage Notes (Signed)
Pt reports lower abd pain, nausea with anxiety x4 hours.  Pt denies diarrhea, vomiting, and rectal bleeding.  Denies urinary s/sy. Denies cp/sob Hx of anxiety and anemia with recent transfusion.

## 2022-06-18 NOTE — Telephone Encounter (Signed)
Patient Advocate Encounter  Prior Authorization for oxyCODONE HCl ER '10MG'$  er tablets has been approved.    PA# [id number] Effective dates: 06/15/22 through 06/15/23

## 2022-06-18 NOTE — ED Provider Notes (Signed)
[facility] Provider Note   CSN: [id number] Arrival date & time: 06/18/22  0932     History  Chief Complaint  Patient presents with   Abdominal Pain    Toni Collins is a 57 y.o. female with medical history of anxiety, depression, anemia, fibromyalgia, GERD, chronic pain syndrome.  Patient presents to ED for evaluation of abdominal pain. Patient complains of generalized abdominal pain since last night. Patient attributes abdominal pain to anxiety. Patient reports history of the same. Patient denies vomiting, fevers, diarrhea. Patient denies dysuria. Patient denies chest pain or shortness of breath. Patient denies medications prior to arrival.  Patient reports that she recently was placed on Luvox and has been taking this as prescribed.  Patient continues that she is a chronic pain patient, takes four 15 mg oxycodones per day.  Abdominal Pain Associated symptoms: no chest pain, no diarrhea, no dysuria, no fever, no nausea, no shortness of breath and no vomiting    Abdominal Pain Associated symptoms: no chest pain, no diarrhea, no dysuria, no fever, no nausea, no shortness of breath and no vomiting    Abdominal Pain Associated symptoms: no diarrhea, no dysuria, no fever, no nausea, no shortness of breath and no vomiting    Abdominal Pain Associated symptoms: no diarrhea, no dysuria, no fever, no nausea and no vomiting    Abdominal Pain Associated symptoms: no diarrhea, no fever, no nausea and no vomiting    Abdominal Pain Associated symptoms: no fever, no nausea and no vomiting    Abdominal Pain Associated symptoms: no fever        Home Medications Prior to Admission medications   Medication Sig Start Date End Date Taking? Authorizing Provider  fluvoxaMINE (LUVOX) 100 MG tablet 1 in the morning and 2 tablets at night 05/31/22   Cottle, Billey Co., MD  Menthol, Topical Analgesic, (BIOFREEZE EX) Apply 1 Application topically  daily as needed (pain).    [provider]  naloxone [facility]) nasal spray 4 mg/0.1 mL Use one actuation per one nostril once as needed for respiratory depression from pain medication 06/08/22   Eulas Post, MD  OLANZapine (ZYPREXA) 20 MG tablet TAKE 1 AND 1/2 TABLET (30 MG TOTAL) BY MOUTH AT BEDTIME. 04/10/22   Cottle, Billey Co., MD  ondansetron (ZOFRAN-ODT) 4 MG disintegrating tablet Take 1 tablet (4 mg total) by mouth every 8 (eight) hours as needed for nausea or vomiting. Please keep your December appointment for further refills. Thank you 02/05/22   Greer Pickerel, MD  oxyCODONE (OXYCONTIN) 10 mg 12 hr tablet Take 1 tablet (10 mg total) by mouth every 12 (twelve) hours. 06/12/22   Burchette, Alinda Sierras, MD  oxyCODONE (ROXICODONE) 15 MG immediate release tablet Take 1 tablet (15 mg total) by mouth every 6 (six) hours. May refill in two months 02/22/22   Burchette, Alinda Sierras, MD  oxyCODONE (ROXICODONE) 15 MG immediate release tablet Take 1 tablet (15 mg total) by mouth every 6 (six) hours. 02/22/22   Burchette, Alinda Sierras, MD  oxyCODONE (ROXICODONE) 15 MG immediate release tablet Take 1 tablet (15 mg total) by mouth every 6 (six) hours. 02/22/22   Burchette, Alinda Sierras, MD  pantoprazole (PROTONIX) 40 MG tablet Take 1 tablet (40 mg total) by mouth daily. 02/05/22 03/07/22  Greer Pickerel, MD  pregabalin (LYRICA) 150 MG capsule Take 1 capsule by mouth every morning, 1 capsule at noon and 1 capsule every night at bedtime as directed by  physician. 05/14/22   Cottle, Billey Co., MD  sertraline (ZOLOFT) 100 MG tablet 2 in the AM Patient taking differently: Take 300 mg by mouth daily. 11/14/21   Cottle, Billey Co., MD      Allergies    Patient has no known allergies.    Review of Systems   Review of Systems  Constitutional:  Negative for fever.  Respiratory:  Negative for shortness of breath.   Cardiovascular:  Negative for chest pain.  Gastrointestinal:  Positive for abdominal pain. Negative for  diarrhea, nausea and vomiting.  Genitourinary:  Negative for dysuria.  All other systems reviewed and are negative.   Physical Exam Updated Vital Signs BP (!) 150/96 (BP Location: Left Arm)   Pulse 61   Temp 98.5 F (36.9 C) (Oral)   Resp 16   SpO2 100%  Physical Exam Vitals and nursing note reviewed.  Constitutional:      General: She is not in acute distress.    Appearance: Normal appearance. She is not ill-appearing, toxic-appearing or diaphoretic.  HENT:     Head: Normocephalic and atraumatic.     Nose: Nose normal. No congestion.     Mouth/Throat:     Mouth: Mucous membranes are moist.     Pharynx: Oropharynx is clear.  Eyes:     Extraocular Movements: Extraocular movements intact.     Conjunctiva/sclera: Conjunctivae normal.     Pupils: Pupils are equal, round, and reactive to light.  Cardiovascular:     Rate and Rhythm: Normal rate and regular rhythm.  Pulmonary:     Effort: Pulmonary effort is normal.     Breath sounds: Normal breath sounds. No wheezing.  Abdominal:     General: Abdomen is flat. Bowel sounds are normal.     Palpations: Abdomen is soft.     Tenderness: There is no abdominal tenderness.     Comments: Patient abdomen is soft and compressible.  No tenderness.  Musculoskeletal:     Cervical back: Normal range of motion and neck supple. No tenderness.     Right lower leg: No edema.     Left lower leg: No edema.  Skin:    General: Skin is warm and dry.     Capillary Refill: Capillary refill takes less than 2 seconds.  Neurological:     Mental Status: She is alert and oriented to person, place, and time.     ED Results / Procedures / Treatments   Labs (all labs ordered are listed, but only abnormal results are displayed) Labs Reviewed  CBC - Abnormal; Notable for the following components:      Result Value   RBC 5.60 (*)    MCV 72.3 (*)    MCH 23.2 (*)    RDW 20.9 (*)    All other components within normal limits  COMPREHENSIVE METABOLIC  PANEL - Abnormal; Notable for the following components:   CO2 17 (*)    Glucose, Bld 111 (*)    Total Protein 9.0 (*)    All other components within normal limits  URINALYSIS, ROUTINE W REFLEX MICROSCOPIC - Abnormal; Notable for the following components:   APPearance HAZY (*)    Hgb urine dipstick SMALL (*)    Protein, ur 30 (*)    Bacteria, UA RARE (*)    All other components within normal limits    EKG None  Radiology CT ABDOMEN PELVIS W CONTRAST  Result Date: 06/18/2022 CLINICAL DATA:  Lower abdominal pain, nausea EXAM: CT ABDOMEN  AND PELVIS WITH CONTRAST TECHNIQUE: Multidetector CT imaging of the abdomen and pelvis was performed using the standard protocol following bolus administration of intravenous contrast. RADIATION DOSE REDUCTION: This exam was performed according to the departmental dose-optimization program which includes automated exposure control, adjustment of the mA and/or kV according to patient size and/or use of iterative reconstruction technique. CONTRAST:  177m OMNIPAQUE IOHEXOL 300 MG/ML  SOLN COMPARISON:  None Available. FINDINGS: Lower chest: No focal pulmonary opacity or pleural effusion. No pericardial effusion. Hepatobiliary: No focal hepatic lesion. The hepatic and portal veins are patent. No intra or extrahepatic biliary ductal dilatation. Gallbladder is unremarkable. Pancreas: Unremarkable. No pancreatic ductal dilatation or surrounding inflammatory changes. Spleen: Normal in size without focal abnormality. Adrenals/Urinary Tract: The adrenal glands are unremarkable. The kidneys enhance symmetrically with no hydronephrosis. The bladder is unremarkable. Stomach/Bowel: Stomach is within normal limits. Appendix is unremarkable. No evidence of bowel wall thickening or evidence of obstruction. Vascular/Lymphatic: The aorta is normal in caliber, and the origins of its branch vessels are patent. No enlarged abdominal or pelvic lymph nodes. Reproductive: Status post  hysterectomy. No adnexal masses. Other: No free air or free fluid in the abdomen or pelvis. Musculoskeletal: No acute osseous abnormality. IMPRESSION: No acute process in the abdomen or pelvis. Electronically Signed   By: AMerilyn BabaM.D.   On: 06/18/2022 12:45    Procedures Procedures    Medications Ordered in ED Medications  sodium chloride (PF) 0.9 % injection (  Not Given 06/18/22 1242)  LORazepam (ATIVAN) tablet 1 mg (1 mg Oral Given 06/18/22 0933)  LORazepam (ATIVAN) tablet 1 mg (1 mg Oral Given 06/18/22 1147)  alum & mag hydroxide-simeth (MAALOX/MYLANTA) 200-200-20 MG/5ML suspension 30 mL (30 mLs Oral Given 06/18/22 1147)  iohexol (OMNIPAQUE) 300 MG/ML solution 100 mL (100 mLs Intravenous Contrast Given 06/18/22 1223)  HYDROmorphone (DILAUDID) injection 0.5 mg (0.5 mg Intravenous Given 06/18/22 1337)    ED Course/ Medical Decision Making/ A&P 1}                          Medical Decision Making Amount and/or Complexity of Data Reviewed Labs: ordered. Radiology: ordered.  Risk OTC drugs. Prescription drug management.   57year old female presents to ED for evaluation.  Please see HPI for further details.  On examination the patient is afebrile and nontachycardic.  Patient lung sounds are clear bilaterally, she is not hypoxic.  Patient abdomen is soft and compressible throughout.  No tenderness elicited.  Patient neurological examination shows no focal neurodeficits.  Patient workup initiated by me in triage includes CBC, CMP, urinalysis.  Patient was provided initially with 1 mg of Ativan as the patient reports that she often gets abdominal pain secondary to anxiety.  After medication was given, the patient reports that her abdominal pain had not yet resolved.  An additional milligram of Ativan was given at this time along with GI cocktail as the patient has diagnosis of GERD.  Patient also had CT abdomen pelvis ordered at this time.  CT abdomen pelvis shows no intra-abdominal  pathology.  The patient reports that the GI cocktail did not alleviate her abdominal discomfort.  With additional milligram of Ativan, the patient reports that her anxiety persists.  The patient states that she takes 15 mg of oxycodone 4 times daily and she is requesting pain medication at this time.  Patient lab work is unremarkable with no leukocytosis or anemia.  The patient BMP is unremarkable with no electrolyte  derangement.  The patient urinalysis is negative for any appearance of infection.   Patient provided 0.5 mg Dilaudid.  Patient reports that pain has subsided at this time, she feels ready for discharge.  Patient given return precautions and she voiced understanding.  Patient had all of her questions answered to her satisfaction.  The patient is stable at this time for discharge home.   Final Clinical Impression(s) / ED Diagnoses Final diagnoses:  Generalized abdominal pain    Rx / DC Orders ED Discharge Orders     None         Lawana Chambers 06/18/22 1447    Valarie Merino, MD 06/18/22 1451

## 2022-06-18 NOTE — Discharge Instructions (Signed)
Please return to the ED with any new or worsening signs or symptoms Please continue taking all prescribed medications as prescribed and directed Please read the attached guide concerning abdominal pain in adults

## 2022-06-18 NOTE — ED Provider Triage Note (Signed)
Emergency Medicine Provider Triage Evaluation Note  Toni Collins , a 57 y.o. female  was evaluated in triage.  Pt complains of generalized abdominal pain since last night. Patient attributes abdominal pain to anxiety. Patient reports history of the same. Patient denies vomiting, fevers, diarrhea. Patient denies dysuria.   Review of Systems  Positive:  Negative:   Physical Exam  BP (!) 130/97 (BP Location: Left Arm)   Pulse 98   Temp 98.9 F (37.2 C)   Resp 18   SpO2 100%  Gen:   Awake, no distress   Resp:  Normal effort  MSK:   Moves extremities without difficulty  Other:  Benign abdomen  Medical Decision Making  Medically screening exam initiated at 9:19 AM.  Appropriate orders placed.  Toni Collins was informed that the remainder of the evaluation will be completed by another provider, this initial triage assessment does not replace that evaluation, and the importance of remaining in the ED until their evaluation is complete.     Azucena Cecil, PA-C 06/18/22 [phone]

## 2022-06-20 ENCOUNTER — Ambulatory Visit: Payer: No Typology Code available for payment source | Admitting: Family Medicine

## 2022-06-20 ENCOUNTER — Encounter: Payer: Self-pay | Admitting: Family Medicine

## 2022-06-20 VITALS — BP 124/80 | HR 84 | Temp 98.0°F | Ht 62.0 in | Wt 178.0 lb

## 2022-06-20 DIAGNOSIS — G8929 Other chronic pain: Secondary | ICD-10-CM

## 2022-06-20 MED ORDER — OXYCODONE HCL 15 MG PO TABS: 15.0000 mg | ORAL_TABLET | Freq: Four times a day (QID) | ORAL | 0 refills | Status: DC

## 2022-06-20 NOTE — Progress Notes (Signed)
Established Patient Office Visit  Subjective   Patient ID: Toni Collins, female    DOB: Dec 28, 1965  Age: 57 y.o. MRN: [id number]  Chief Complaint  Patient presents with   Follow-up    HPI   Toni Collins is seen for her routine scheduled 58-monthchronic pain management follow-up.  As per recent note had recent right rotator cuff surgery repair and has had some increased expected postoperative pain.  She does feel like this is settling down and improving some at this time.  She had visit to the ER 2 days ago of abdominal pain.  CBC normal white count.  Hemoglobin is improved to 13.0.  CT abdomen pelvis no acute findings.  Denies any abdominal pain at this time. Denies any constipation issues.  No adverse side effects from medication.  Indication for chronic opioid: Chronic back pain Medication and dose: Oxycodone 15 mg every 6 hours # pills per month: 120 Last UDS date: 2022 Opioid Treatment Agreement signed (Y/N): yes Opioid Treatment Agreement last reviewed with patient: today  NPopereviewed this encounter (include red flags): No red flags  Past Medical History:  Diagnosis Date   ADHD    Anemia    Anxiety    Arthritis    Colon polyps    DEPRESSION 09/02/2008   Fibromyalgia    GERD (gastroesophageal reflux disease)    GRIEF REACTION 09/30/2009   INSOMNIA, CHRONIC 09/02/2008   PREDIABETES 03/10/2009   PTSD (post-traumatic stress disorder)    Past Surgical History:  Procedure Laterality Date   ABDOMINAL HYSTERECTOMY  05/28/2000   TAH   COLONOSCOPY     DIAGNOSTIC LAPAROSCOPY  1993, 1994, 1998, 2000   x4   TMJ ARTHROPLASTY     x2   UPPER GASTROINTESTINAL ENDOSCOPY     UPPER GI ENDOSCOPY N/A 02/02/2022   Procedure: UPPER GI ENDOSCOPY;  Surgeon: WGreer Pickerel MD;  Location: [facility];  Service: General;  Laterality: N/A;   XI ROBOTIC ASSISTED HIATAL HERNIA REPAIR N/A 02/02/2022   Procedure: XI ROBOTIC ASSISTED HIATAL HERNIA REPAIR WITH PARTAL WRAP, INSERTION OF CHEST  TUBE;  Surgeon: WGreer Pickerel MD;  Location: [facility];  Service: General;  Laterality: N/A;    reports that she has never smoked. She has never used smokeless tobacco. She reports that she does not drink alcohol and does not use drugs. family history includes Arthritis in her mother; Colon polyps in her father and sister; Diabetes in her maternal aunt and maternal grandmother; Lung disease in her father. No Known Allergies    Review of Systems  Constitutional:  Negative for chills and fever.  Respiratory:  Negative for cough and shortness of breath.   Cardiovascular:  Negative for chest pain.      Objective:     BP 124/80 (BP Location: Left Arm, Patient Position: Sitting, Cuff Size: Normal)   Pulse 84   Temp 98 F (36.7 C) (Oral)   Ht '5\' 2"'$  (1.575 m)   Wt 178 lb (80.7 kg)   SpO2 95%   BMI 32.56 kg/m    Physical Exam Vitals reviewed.  Constitutional:      Appearance: Normal appearance.  Cardiovascular:     Rate and Rhythm: Normal rate and regular rhythm.  Pulmonary:     Effort: Pulmonary effort is normal.     Breath sounds: Normal breath sounds. No wheezing or rales.  Neurological:     Mental Status: She is alert.      No results found for any visits on  06/20/22.    The ASCVD Risk score (Arnett DK, et al., 2019) failed to calculate for the following reasons:   Cannot find a previous HDL lab   Cannot find a previous total cholesterol lab    Assessment & Plan:    Chronic pain syndrome with chronic back pain on chronic opioid therapy.  She had acute issue recently of right shoulder surgery but that is gradually improving.  -Urine drug screen obtained -Refill oxycodone for 3 months -We did recently prescribe Narcan to have on hand  Return in about 3 months (around 09/19/2022).    Carolann Littler, MD

## 2022-06-22 ENCOUNTER — Other Ambulatory Visit: Payer: Self-pay | Admitting: Psychiatry

## 2022-06-22 DIAGNOSIS — F411 Generalized anxiety disorder: Secondary | ICD-10-CM

## 2022-06-22 DIAGNOSIS — F422 Mixed obsessional thoughts and acts: Secondary | ICD-10-CM

## 2022-06-22 DIAGNOSIS — F4310 Post-traumatic stress disorder, unspecified: Secondary | ICD-10-CM

## 2022-06-22 DIAGNOSIS — F39 Unspecified mood [affective] disorder: Secondary | ICD-10-CM

## 2022-07-02 ENCOUNTER — Ambulatory Visit: Payer: No Typology Code available for payment source | Admitting: Family Medicine

## 2022-07-02 ENCOUNTER — Telehealth: Payer: Self-pay | Admitting: Psychiatry

## 2022-07-02 NOTE — Telephone Encounter (Signed)
Toni Collins called at 4:05 to report that the new medication - Fluoxamine is causing her anxiety to increase.  Her husband reported that it is off the charts!  He hasn't seen it this bad since 1999.  Has appt 2/29 but needs to know what to do until then.  Please call to discuss.

## 2022-07-02 NOTE — Telephone Encounter (Signed)
Pt stated her anxiety is extremely high and she has not felt like this in a long time.She has been crying and is scared to be alone.Anxiety was so high that she went to the ER and they gave her 3 pills that did help.She thinks it was ativan that they gave her and it helped anxiety through out the day.She is taking fluvoxamine 100 mg 1 in am and 2 at night

## 2022-07-03 NOTE — Telephone Encounter (Signed)
We switched from sertraline to fluvoxamine a month ago to help intrusive anxious thoughts.  She has been on the full dose of fluvoxamine for only 2 weeks.  She needs to give that another couple of weeks.  I doubt that is making her worse but it is possible that the sertraline has worn off and the fluvoxamine has not kicked in yet. I have never seen fluvoxamine cause anxiety.  I want Her to retry clonidine for the anxiety because it can work very quickly.  it can work in about 30 minutes.  She can start 1 tablet twice daily for 3 days and if it does not work increase to 2 twice daily.  She has never taken that dosage before.  And is very fast.  I am not going to give a benzodiazepine

## 2022-07-04 NOTE — Telephone Encounter (Signed)
Pt informed

## 2022-07-10 ENCOUNTER — Other Ambulatory Visit: Payer: Self-pay | Admitting: Psychiatry

## 2022-07-10 ENCOUNTER — Telehealth: Payer: Self-pay | Admitting: Psychiatry

## 2022-07-10 DIAGNOSIS — F4310 Post-traumatic stress disorder, unspecified: Secondary | ICD-10-CM

## 2022-07-10 DIAGNOSIS — F39 Unspecified mood [affective] disorder: Secondary | ICD-10-CM

## 2022-07-10 NOTE — Telephone Encounter (Signed)
She would like it sent to   [id number] [place] [place] [address]Phone: [phone] Fax: [phone]

## 2022-07-10 NOTE — Telephone Encounter (Signed)
Is patient still taking Lybalvi?

## 2022-07-10 NOTE — Telephone Encounter (Signed)
See 2/5 message with regards to fluvoxamine. Patient needs to give it more time. Will call patient again and reiterate this information.

## 2022-07-10 NOTE — Telephone Encounter (Signed)
Pt called at 4:18p.  She said the med she is on is not working.  She wants to know if she can get back on Zyprexa until she sees Dr. Clovis Pu since it helps with her crying.  Next appt 2/29

## 2022-07-11 MED ORDER — CLONIDINE HCL 0.1 MG PO TABS: 0.1000 mg | ORAL_TABLET | ORAL | 0 refills | Status: DC

## 2022-07-11 NOTE — Telephone Encounter (Signed)
Pt called back at 11:25a today.  She said the fluoxemine is just like she's not taking anything.  That's why she wants the Zyprexa instead.  Pls call her back to let her know what she needs to do.

## 2022-07-11 NOTE — Telephone Encounter (Signed)
Called patient and reinforced the info from previous message. She does not have any clonidine and Rx sent to the requested pharmacy.

## 2022-07-13 NOTE — Telephone Encounter (Signed)
Ok to continue olanzapine 30 mg daily.  Continue fluvoxamine another month.  It is slow.  If it doesn't work in a month we will stop it.

## 2022-07-13 NOTE — Telephone Encounter (Signed)
Patient notified of recommendations. 

## 2022-07-24 ENCOUNTER — Other Ambulatory Visit: Payer: Self-pay | Admitting: General Surgery

## 2022-07-24 DIAGNOSIS — K449 Diaphragmatic hernia without obstruction or gangrene: Secondary | ICD-10-CM

## 2022-07-26 ENCOUNTER — Telehealth: Payer: Self-pay | Admitting: Psychiatry

## 2022-07-26 ENCOUNTER — Encounter: Payer: Self-pay | Admitting: Psychiatry

## 2022-07-26 ENCOUNTER — Other Ambulatory Visit: Payer: Self-pay | Admitting: Psychiatry

## 2022-07-26 ENCOUNTER — Ambulatory Visit (INDEPENDENT_AMBULATORY_CARE_PROVIDER_SITE_OTHER): Payer: Medicare Other | Admitting: Psychiatry

## 2022-07-26 DIAGNOSIS — F4310 Post-traumatic stress disorder, unspecified: Secondary | ICD-10-CM | POA: Diagnosis not present

## 2022-07-26 DIAGNOSIS — F422 Mixed obsessional thoughts and acts: Secondary | ICD-10-CM

## 2022-07-26 DIAGNOSIS — G894 Chronic pain syndrome: Secondary | ICD-10-CM

## 2022-07-26 DIAGNOSIS — F902 Attention-deficit hyperactivity disorder, combined type: Secondary | ICD-10-CM

## 2022-07-26 DIAGNOSIS — F39 Unspecified mood [affective] disorder: Secondary | ICD-10-CM | POA: Diagnosis not present

## 2022-07-26 DIAGNOSIS — F411 Generalized anxiety disorder: Secondary | ICD-10-CM | POA: Diagnosis not present

## 2022-07-26 DIAGNOSIS — F5105 Insomnia due to other mental disorder: Secondary | ICD-10-CM

## 2022-07-26 MED ORDER — PREGABALIN 150 MG PO CAPS: 150.0000 mg | ORAL_CAPSULE | Freq: Four times a day (QID) | ORAL | 0 refills | Status: DC

## 2022-07-26 MED ORDER — SERTRALINE HCL 100 MG PO TABS: ORAL_TABLET | ORAL | 1 refills | Status: DC
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
## 2022-07-26 NOTE — Patient Instructions (Signed)
Stop clonidine Increase Lyrica 1 four times daily Continue sertraline 3 daily and  olanzapine 1 and 1/2 tablet nightly.

## 2022-07-26 NOTE — Telephone Encounter (Signed)
PA needed for 3 sertraline qd

## 2022-08-02 NOTE — Telephone Encounter (Signed)
Has  A PA been done on the zoloft. Pt is wanting to go back down to two a day because she is out. Please call pt and let her know if it has been approved

## 2022-08-03 NOTE — Telephone Encounter (Signed)
PA is pending.

## 2022-08-03 NOTE — Telephone Encounter (Signed)
Prior Authorization submitted for Sertraline 100 mg 3 tablets daily #270 with [facility]. Dr. Clovis Pu please read denial and discuss with me. Pt is out of medication.  Denied. Unable to approve the medication (SERTRALINE HCL Tablet '100MG'$ ) due to unmet criteria (1-3) as outlined in the plan guideline below. Plan guideline (CP.PMN.59 Quantity Limit Override and Dose Optimization) requires the following to be met prior to consideration of approval: Quantity Limit (QL) Exceptions (must meet all): 1. One of the following (a or b): a. Requested dose is supported by practice guidelines or peer-reviewed literature (e.g., phase 3 study or equivalent published in a reputable peer reviewed medical journal or text) for the relevant off-label use and/or regimen (prescriber must submit supporting evidence); b. Diagnosis of a rare condition/disease* for which Food and Drug Administration (FDA) dosing guidelines indicate a higher quantity (dose or frequency) than the currently set quantity limit (QL); *Example: Proton pump inhibitors, which are commonly used for gastroesophageal reflux disease, have a QL of one dose per day; however, when there is a rare diagnosis such as Zollinger-Ellison syndrome, an override for two doses per day is allowed 2. Member has been titrated up from the lower dose with partial improvement without adverse reactions; 3. Dose optimization is required, unless one of the following applies (a or b): a. Dose titration: Multiple lower strength units are requested for the purpose of dose titration; b. Other clinical reasons: Medical justification supports inability to use the higher strength units to achieve the desired dose/regimen. Note: If you believe the member has met criteria (1-3) as described above, please resubmit your request with additional clinically supportive documentation for consideration.

## 2022-08-06 ENCOUNTER — Ambulatory Visit: Payer: No Typology Code available for payment source | Admitting: Family Medicine

## 2022-08-07 NOTE — Telephone Encounter (Signed)
I emailed a study for you to send to appeal the denial.  If that is a problem she'll have to pay cash for the extra 100 mg dose

## 2022-08-07 NOTE — Telephone Encounter (Signed)
I just wanted to make sure you saw her denial?

## 2022-08-08 NOTE — Telephone Encounter (Signed)
I have article and will submit for the Sertraline PA.

## 2022-08-09 MED ORDER — SERTRALINE HCL 100 MG PO TABS: 100.0000 mg | ORAL_TABLET | Freq: Every day | ORAL | 0 refills | Status: DC

## 2022-08-09 NOTE — Telephone Encounter (Signed)
Until we can get an approval for her Sertraline, she will use [facility] for 100 mg #30, then a Rx for 100 mg 2/day sertraline sent through her insurance. Pt aware and agrees.   Waiting for prior authorization to come back on her Pregabalin 150 mg capsules.

## 2022-08-10 NOTE — Telephone Encounter (Signed)
Prior Authorization initiated for pregabalin 150 mg capsules #360/90 day with [facility], denial received. See below for response.   Will let Dr. Clovis Pu review   Denied. Unable to approve the medication (PREGABALIN Capsule 150MG ) due to unmet criteria (3) as outlined in the plan guideline below. Plan guideline (CP.PMN.59 Quantity Limit Override and Dose Optimization) requires the following to be met prior to consideration of approval: Quantity Limit (QL) Exceptions (must meet all): 1. One of the following (a or b): a. Requested dose is supported by practice guidelines or peer-reviewed literature (e.g., phase 3 study or equivalent published in a reputable peer reviewed medical journal or text) for the relevant off-label use and/or regimen (prescriber must submit supporting evidence); b. Diagnosis of a rare condition/disease* for which Food and Drug Administration (FDA) dosing guidelines indicate a higher quantity (dose or frequency) than the currently set quantity limit (QL); *Example: Proton pump inhibitors, which are commonly used for gastroesophageal reflux disease, have a QL of one dose per day; however, when there is a rare diagnosis such as Zollinger-Ellison syndrome, an override for two doses per day is allowed 2. Member has been titrated up from the lower dose with partial improvement without adverse reactions; 3. Dose optimization is required, unless one of the following applies (a or b): a. Dose titration: Multiple lower strength units are requested for the purpose of dose titration; b. Other clinical reasons: Medical justification supports inability to use the higher strength units to achieve the desired dose/regimen. Note: If you believe the member has met criteria (3) as described above, please resubmit your request with additional clinically supportive documentation for consideration.

## 2022-08-10 NOTE — Telephone Encounter (Signed)
I have an article to supply for the appeal of the denial which is based on dosage.  Sending to your email bc I don't know how to add it to Epic

## 2022-08-10 NOTE — Telephone Encounter (Signed)
Noted thanks, I will submit along with her Sertraline article too.

## 2022-08-16 ENCOUNTER — Other Ambulatory Visit: Payer: Self-pay | Admitting: Psychiatry

## 2022-08-16 DIAGNOSIS — G894 Chronic pain syndrome: Secondary | ICD-10-CM

## 2022-08-17 ENCOUNTER — Inpatient Hospital Stay: Admission: RE | Admit: 2022-08-17 | Payer: No Typology Code available for payment source | Source: Ambulatory Visit

## 2022-08-17 ENCOUNTER — Other Ambulatory Visit: Payer: Self-pay | Admitting: Psychiatry

## 2022-08-17 NOTE — Telephone Encounter (Signed)
Reordered last month

## 2022-08-31 ENCOUNTER — Inpatient Hospital Stay: Admission: RE | Admit: 2022-08-31 | Payer: No Typology Code available for payment source | Source: Ambulatory Visit

## 2022-08-31 ENCOUNTER — Other Ambulatory Visit: Payer: No Typology Code available for payment source

## 2022-09-02 DIAGNOSIS — R110 Nausea: Secondary | ICD-10-CM | POA: Diagnosis not present

## 2022-09-02 DIAGNOSIS — F411 Generalized anxiety disorder: Secondary | ICD-10-CM | POA: Diagnosis not present

## 2022-09-02 DIAGNOSIS — R9431 Abnormal electrocardiogram [ECG] [EKG]: Secondary | ICD-10-CM | POA: Diagnosis not present

## 2022-09-02 DIAGNOSIS — F419 Anxiety disorder, unspecified: Secondary | ICD-10-CM | POA: Diagnosis not present

## 2022-09-03 ENCOUNTER — Encounter: Payer: Self-pay | Admitting: Psychiatry

## 2022-09-03 ENCOUNTER — Ambulatory Visit (INDEPENDENT_AMBULATORY_CARE_PROVIDER_SITE_OTHER): Payer: Medicare HMO | Admitting: Psychiatry

## 2022-09-03 DIAGNOSIS — F902 Attention-deficit hyperactivity disorder, combined type: Secondary | ICD-10-CM

## 2022-09-03 DIAGNOSIS — F39 Unspecified mood [affective] disorder: Secondary | ICD-10-CM | POA: Diagnosis not present

## 2022-09-03 DIAGNOSIS — F422 Mixed obsessional thoughts and acts: Secondary | ICD-10-CM | POA: Diagnosis not present

## 2022-09-03 DIAGNOSIS — F4310 Post-traumatic stress disorder, unspecified: Secondary | ICD-10-CM

## 2022-09-03 DIAGNOSIS — F5105 Insomnia due to other mental disorder: Secondary | ICD-10-CM

## 2022-09-03 DIAGNOSIS — G894 Chronic pain syndrome: Secondary | ICD-10-CM | POA: Diagnosis not present

## 2022-09-03 DIAGNOSIS — F411 Generalized anxiety disorder: Secondary | ICD-10-CM | POA: Diagnosis not present

## 2022-09-03 MED ORDER — SERTRALINE HCL 100 MG PO TABS: 200.0000 mg | ORAL_TABLET | Freq: Two times a day (BID) | ORAL | 1 refills | Status: DC

## 2022-09-03 MED ORDER — RISPERIDONE 2 MG PO TABS: 2.0000 mg | ORAL_TABLET | Freq: Two times a day (BID) | ORAL | 1 refills | Status: DC
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
## 2022-09-04 ENCOUNTER — Telehealth: Payer: Self-pay | Admitting: Psychiatry

## 2022-09-04 NOTE — Telephone Encounter (Signed)
Pt called at 1:19p.  She said the Risperdone Dr Jennelle Human put her on yesterday is making her sick.  She said it feels like she has the flu.  She can't get out of the bed.  She wants him to send in the Paxil, which worked for her.  Send to   [id number] - [place], [place] - [address], [place] [address] Phone: [phone]  Fax: [phone]    Next appt 4/29

## 2022-09-05 NOTE — Telephone Encounter (Signed)
Sound like I gave her too high of a dosage of risperidone.  She cannot take Paxil with sertraline and I think she has more chance of success by increasing sertraline than switching to paroxetine which would not help quickly.   The point of risperidone is to help quickly.  Suggest she try it again at 1/4 to 1/2 tablet just at night.  I bet she would tolerate that and it could help her anxiety right away while she waits for the sertraline increase to help her.

## 2022-09-05 NOTE — Telephone Encounter (Signed)
Recommendations given to the patient. 

## 2022-09-05 NOTE — Telephone Encounter (Signed)
Patient reporting risperidone making her nauseated, feeling like she has the flu and a hangover. She started it yesterday and has taken it today with still same sx. She is requesting Paxil.  In a earlier note it is mentioned that Paxil caused nightmares. Patient said she didn't remember that and that she would like to try Paxil again. She was last on 60 mg in 2020.

## 2022-09-07 ENCOUNTER — Other Ambulatory Visit: Payer: Self-pay | Admitting: Psychiatry

## 2022-09-07 MED ORDER — ASENAPINE MALEATE 5 MG SL SUBL: 5.0000 mg | SUBLINGUAL_TABLET | Freq: Two times a day (BID) | SUBLINGUAL | 0 refills | Status: DC

## 2022-09-07 NOTE — Telephone Encounter (Signed)
The increase in sertraline is going to be the most helpful thing for her anxiety but it takes a few weeks to work.  We tried risperidone and olanzapine to see if it could bring down her anxiety quicker and that did not work.  There is one more medicine I can think of because Saphris.  I sent in a 1 week prescription of this to see if it would help her.  Remind her she puts one under her tongue and does not eat or drink for 10 minutes after putting it on her her tongue.  It is absorbed under the tongue and do not swallow it.  In looking over her chart I was also reminded she had a recent hospitalization due to low oxygen which they determined she is anemic with very low iron levels.  This can cause anxiety to.  Make sure she is taking iron in some form and if she is not she needs to get in touch with her primary care doctor to have some form of iron.

## 2022-09-07 NOTE — Telephone Encounter (Signed)
Patient notified of Rx and recommendations.  

## 2022-09-13 ENCOUNTER — Encounter: Payer: Self-pay | Admitting: Family Medicine

## 2022-09-13 ENCOUNTER — Ambulatory Visit (INDEPENDENT_AMBULATORY_CARE_PROVIDER_SITE_OTHER): Payer: No Typology Code available for payment source | Admitting: Family Medicine

## 2022-09-13 VITALS — BP 110/82 | HR 73 | Temp 98.1°F | Wt 179.0 lb

## 2022-09-13 DIAGNOSIS — M797 Fibromyalgia: Secondary | ICD-10-CM

## 2022-09-13 DIAGNOSIS — F1190 Opioid use, unspecified, uncomplicated: Secondary | ICD-10-CM | POA: Diagnosis not present

## 2022-09-13 MED ORDER — OXYCODONE HCL 15 MG PO TABS: 15.0000 mg | ORAL_TABLET | Freq: Four times a day (QID) | ORAL | 0 refills | Status: AC

## 2022-09-13 MED ORDER — OXYCODONE HCL 15 MG PO TABS: 15.0000 mg | ORAL_TABLET | Freq: Four times a day (QID) | ORAL | 0 refills | Status: DC

## 2022-09-13 NOTE — Progress Notes (Signed)
   Subjective:    Patient ID: Toni Collins, female    DOB: Jul 08, 1965, 57 y.o.   MRN: [id number]  HPI Here for pain management. She sees Dr. [name] normally, but we are caring for her while he is out of the office. Her low back pain and fibromyalgia are stable. She recently had shoulder surgery, so she will run out of her pain medication a few days sooner than usual.    Review of Systems  Constitutional: Negative.   Musculoskeletal:  Positive for back pain and myalgias.       Objective:   Physical Exam Constitutional:      Appearance: Normal appearance.  Neurological:     Mental Status: She is alert.           Assessment & Plan:  Pain management.  Indication for chronic opioid: low back pain and fibromyalgia  Medication and dose: Oxycodone 15 mg  # pills per month: 120 Last UDS date: 09-13-22 Opioid Treatment Agreement signed (Y/N): 09-14-17 Opioid Treatment Agreement last reviewed with patient:  09-13-22 [facility] reviewed this encounter (include red flags): Yes We will refill these for 2 months to cover her until Dr. [name] returns.  Gershon Crane, MD

## 2022-09-15 LAB — DRUG MONITOR, PANEL 1, W/CONF, URINE
Amphetamines: NEGATIVE ng/mL
Barbiturates: NEGATIVE ng/mL
Benzodiazepines: NEGATIVE ng/mL
Cocaine Metabolite: NEGATIVE ng/mL
Codeine: NEGATIVE ng/mL
Creatinine: 78.5 mg/dL
Hydrocodone: NEGATIVE ng/mL
Hydromorphone: NEGATIVE ng/mL
Marijuana Metabolite: NEGATIVE ng/mL
Methadone Metabolite: NEGATIVE ng/mL
Morphine: NEGATIVE ng/mL
Norhydrocodone: NEGATIVE ng/mL
Noroxycodone: >10000 ng/mL — ABNORMAL HIGH
Opiates: NEGATIVE ng/mL
Oxidant: NEGATIVE ug/mL
Oxycodone: 3389 ng/mL — ABNORMAL HIGH
Oxycodone: POSITIVE ng/mL — AB
Oxymorphone: 484 ng/mL — ABNORMAL HIGH
Phencyclidine: NEGATIVE ng/mL
pH: 5.5 (ref 4.5–9.0)

## 2022-09-15 LAB — DM TEMPLATE

## 2022-09-21 ENCOUNTER — Ambulatory Visit: Payer: No Typology Code available for payment source | Admitting: Family Medicine

## 2022-09-24 ENCOUNTER — Encounter: Payer: Self-pay | Admitting: Psychiatry

## 2022-09-24 ENCOUNTER — Ambulatory Visit (INDEPENDENT_AMBULATORY_CARE_PROVIDER_SITE_OTHER): Payer: Medicare Other | Admitting: Psychiatry

## 2022-09-24 DIAGNOSIS — F5105 Insomnia due to other mental disorder: Secondary | ICD-10-CM

## 2022-09-24 DIAGNOSIS — F411 Generalized anxiety disorder: Secondary | ICD-10-CM | POA: Diagnosis not present

## 2022-09-24 DIAGNOSIS — F902 Attention-deficit hyperactivity disorder, combined type: Secondary | ICD-10-CM | POA: Diagnosis not present

## 2022-09-24 DIAGNOSIS — F39 Unspecified mood [affective] disorder: Secondary | ICD-10-CM

## 2022-09-24 DIAGNOSIS — F4310 Post-traumatic stress disorder, unspecified: Secondary | ICD-10-CM

## 2022-09-24 DIAGNOSIS — G894 Chronic pain syndrome: Secondary | ICD-10-CM | POA: Diagnosis not present

## 2022-09-24 DIAGNOSIS — F422 Mixed obsessional thoughts and acts: Secondary | ICD-10-CM | POA: Diagnosis not present

## 2022-09-24 MED ORDER — PAROXETINE HCL 40 MG PO TABS: 60.0000 mg | ORAL_TABLET | ORAL | 1 refills | Status: DC

## 2022-09-24 MED ORDER — PREGABALIN 150 MG PO CAPS: 150.0000 mg | ORAL_CAPSULE | Freq: Four times a day (QID) | ORAL | 0 refills | Status: DC

## 2022-09-24 NOTE — Progress Notes (Signed)
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 increase paroxetine to 1 and 1/2 tablets and reduce sertraline to 1/2 tablet daily for 4 days, Then stop sertraline.  Continue paroxetine 1 and 1/2 tablets daily.   Disc SE in detail and SSRI withdrawal sx.  Can [employer] [facility] OCD, Janeann Forehand.  She just bought it.  Rec counseling.  No BZ on oxycodone if at all possible. Off Adderall since mid 2023.  Hold Adderalll until anxiety managed Disc sig risk tolerance with BZ given history of PTSD  Consider beta blocker  Disc Good RX  Disc SE each med.  FU next available.  Meredith Staggers, MD, DFAPA   Please see After Visit Summary for patient specific instructions.  No future appointments.    No orders of the defined types were placed in this encounter.      -------------------------------

## 2022-09-24 NOTE — Patient Instructions (Signed)
Start paroxetine 1/2 tablet daily and reduce sertraline to 2 tablets daily for 4 days,  Then increase paroxetine to 1 daily and reduce sertraline to 1 daily for 4 days, Then increase paroxetine to 1 and 1/2 tablets and reduce sertraline to 1/2 tablet daily for 4 days, Then stop sertraline.  Continue paroxetine 1 and 1/2 tablets daily.

## 2022-09-28 ENCOUNTER — Other Ambulatory Visit: Payer: Self-pay | Admitting: Psychiatry

## 2022-09-28 DIAGNOSIS — F39 Unspecified mood [affective] disorder: Secondary | ICD-10-CM

## 2022-09-28 DIAGNOSIS — F411 Generalized anxiety disorder: Secondary | ICD-10-CM

## 2022-09-28 DIAGNOSIS — F422 Mixed obsessional thoughts and acts: Secondary | ICD-10-CM

## 2022-09-28 DIAGNOSIS — F4310 Post-traumatic stress disorder, unspecified: Secondary | ICD-10-CM

## 2022-10-11 ENCOUNTER — Other Ambulatory Visit: Payer: Self-pay | Admitting: Psychiatry

## 2022-10-11 DIAGNOSIS — F39 Unspecified mood [affective] disorder: Secondary | ICD-10-CM

## 2022-10-11 DIAGNOSIS — F4310 Post-traumatic stress disorder, unspecified: Secondary | ICD-10-CM

## 2022-10-16 ENCOUNTER — Other Ambulatory Visit: Payer: Self-pay | Admitting: Psychiatry

## 2022-10-16 DIAGNOSIS — F422 Mixed obsessional thoughts and acts: Secondary | ICD-10-CM

## 2022-10-16 DIAGNOSIS — F4310 Post-traumatic stress disorder, unspecified: Secondary | ICD-10-CM

## 2022-10-16 DIAGNOSIS — F411 Generalized anxiety disorder: Secondary | ICD-10-CM

## 2022-10-16 DIAGNOSIS — F39 Unspecified mood [affective] disorder: Secondary | ICD-10-CM

## 2022-10-29 ENCOUNTER — Telehealth: Payer: Self-pay | Admitting: Psychiatry

## 2022-10-29 ENCOUNTER — Other Ambulatory Visit: Payer: Self-pay | Admitting: Psychiatry

## 2022-10-29 NOTE — Telephone Encounter (Signed)
From 4/29:  Start paroxetine 1/2 tablet daily and reduce sertraline to 2 tablets daily for 4 days,  Then increase paroxetine to 1 daily and reduce sertraline to 1 daily for 4 days, Then increase paroxetine to 1 and 1/2 tablets and reduce sertraline to 1/2 tablet daily for 4 days, Then stop sertraline.  Continue paroxetine 1 and 1/2 tablets daily.  Patient reporting severe anxiety, hasn't been out of bed in 3 weeks, nauseated, crying all the time, not sleeping. Rates anxiety as 9/10. No new stressors.

## 2022-10-29 NOTE — Telephone Encounter (Signed)
Patient notified

## 2022-10-29 NOTE — Telephone Encounter (Signed)
She has onl beeen on this dose of paroxetine 60 mg for a little over 2 weeks.  Need to give it 2 weeks more before we consider any changes.

## 2022-10-29 NOTE — Telephone Encounter (Signed)
I called pt at 11:55 to r/s appt from 6/5 due to Dr Jennelle Human being out of the office.  Pt said she is not doing good. She said medication is not helping her.  She wants a call back.  She also asked to be put on the wait list.  Next appt 7/23

## 2022-10-31 ENCOUNTER — Ambulatory Visit: Payer: No Typology Code available for payment source | Admitting: Psychiatry

## 2022-11-06 ENCOUNTER — Encounter: Payer: Self-pay | Admitting: Psychiatry

## 2022-11-06 ENCOUNTER — Ambulatory Visit (INDEPENDENT_AMBULATORY_CARE_PROVIDER_SITE_OTHER): Payer: Medicare HMO | Admitting: Psychiatry

## 2022-11-06 DIAGNOSIS — F411 Generalized anxiety disorder: Secondary | ICD-10-CM

## 2022-11-06 DIAGNOSIS — F422 Mixed obsessional thoughts and acts: Secondary | ICD-10-CM | POA: Diagnosis not present

## 2022-11-06 DIAGNOSIS — F5105 Insomnia due to other mental disorder: Secondary | ICD-10-CM

## 2022-11-06 DIAGNOSIS — G894 Chronic pain syndrome: Secondary | ICD-10-CM | POA: Diagnosis not present

## 2022-11-06 DIAGNOSIS — F4310 Post-traumatic stress disorder, unspecified: Secondary | ICD-10-CM

## 2022-11-06 DIAGNOSIS — F902 Attention-deficit hyperactivity disorder, combined type: Secondary | ICD-10-CM | POA: Diagnosis not present

## 2022-11-06 DIAGNOSIS — F39 Unspecified mood [affective] disorder: Secondary | ICD-10-CM | POA: Diagnosis not present

## 2022-11-06 NOTE — Progress Notes (Signed)
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 the agoraphobia  Disc Good RX  Disc SE each med.  FU 2 mos.  Meredith Staggers, MD, DFAPA   Please see After Visit Summary for patient specific instructions.  Future Appointments  Date Time Provider Department Center  11/13/2022 10:15 AM Toni Collins, Elberta Fortis, MD [facility]  12/18/2022  9:00 AM Cottle, Steva Ready., MD [facility] None      No orders of the defined types were placed in this encounter.      -------------------------------

## 2022-11-13 ENCOUNTER — Encounter: Payer: Self-pay | Admitting: Family Medicine

## 2022-11-13 ENCOUNTER — Telehealth (INDEPENDENT_AMBULATORY_CARE_PROVIDER_SITE_OTHER): Payer: Medicare HMO | Admitting: Family Medicine

## 2022-11-13 VITALS — Ht 62.0 in | Wt 179.0 lb

## 2022-11-13 DIAGNOSIS — G894 Chronic pain syndrome: Secondary | ICD-10-CM

## 2022-11-13 MED ORDER — OXYCODONE HCL 15 MG PO TABS: 15.0000 mg | ORAL_TABLET | Freq: Four times a day (QID) | ORAL | 0 refills | Status: DC

## 2022-11-13 MED ORDER — OXYCODONE HCL 15 MG PO TABS: 15.0000 mg | ORAL_TABLET | Freq: Four times a day (QID) | ORAL | 0 refills | Status: AC

## 2022-11-13 NOTE — Progress Notes (Signed)
Patient ID: Toni Collins, female   DOB: Mar 25, 1966, 57 y.o.   MRN: [id number]  Virtual Visit via Video Note  I connected with Toni Collins on 11/13/22 at 10:15 AM EDT by a video enabled telemedicine application and verified that I am speaking with the correct person using two identifiers.  Location patient: home Location provider:work or home office Persons participating in the virtual visit: patient, provider  I discussed the limitations of evaluation and management by telemedicine and the availability of in person appointments. The patient expressed understanding and agreed to proceed.   HPI: Toni Collins is here today for chronic pain medicine follow-up.  Has history of chronic fibromyalgia and chronic back pain.  She has been on oxycodone for several years.  Currently takes 15 mg 4 times daily.  Denies any constipation or other side effects.  She has longstanding history of generalized anxiety and recurrent depression as well as PTSD.  Followed by psychiatry.  Recently changed from Zoloft to Paxil.  Her anxiety symptoms are currently stable.  She continues to have chronic daily pain but unchanged.  Has not responded to several other previous medications including over-the-counter Tylenol, nonsteroidals, tricyclic's, Cymbalta.  She does take Lyrica per psychiatry  Indication for chronic opioid: Chronic back pain and chronic fibromyalgia pain Medication and dose: Oxycodone 15 mg every 6 hours # pills per month: 120 Last UDS date: 4/24 Opioid Treatment Agreement signed (Y/N): yes Opioid Treatment Agreement last reviewed with patient: 11/13/22  [facility] reviewed this encounter (include red flags): None     ROS: See pertinent positives and negatives per HPI.  Past Medical History:  Diagnosis Date   ADHD    Anemia    Anxiety    Arthritis    Colon polyps    DEPRESSION 09/02/2008   Fibromyalgia    GERD (gastroesophageal reflux disease)    GRIEF REACTION 09/30/2009   INSOMNIA,  CHRONIC 09/02/2008   PREDIABETES 03/10/2009   PTSD (post-traumatic stress disorder)     Past Surgical History:  Procedure Laterality Date   ABDOMINAL HYSTERECTOMY  05/28/2000   TAH   COLONOSCOPY     DIAGNOSTIC LAPAROSCOPY  1993, 1994, 1998, 2000   x4   TMJ ARTHROPLASTY     x2   UPPER GASTROINTESTINAL ENDOSCOPY     UPPER GI ENDOSCOPY N/A 02/02/2022   Procedure: UPPER GI ENDOSCOPY;  Surgeon: Toni Adu, MD;  Location: [facility];  Service: General;  Laterality: N/A;   XI ROBOTIC ASSISTED HIATAL HERNIA REPAIR N/A 02/02/2022   Procedure: XI ROBOTIC ASSISTED HIATAL HERNIA REPAIR WITH PARTAL WRAP, INSERTION OF CHEST TUBE;  Surgeon: Toni Adu, MD;  Location: [facility];  Service: General;  Laterality: N/A;    Family History  Problem Relation Age of Onset   Arthritis Mother        rhematiod   Colon polyps Father    Lung disease Father    Colon polyps Sister    Diabetes Maternal Aunt    Diabetes Maternal Grandmother    Depression Neg Hx        family   Colon cancer Neg Hx    Esophageal cancer Neg Hx    Pancreatic cancer Neg Hx    Stomach cancer Neg Hx    Rectal cancer Neg Hx     SOCIAL HX: non-smoker   no ETOH.   Her daughter just got engaged.     Current Outpatient Medications:    Menthol, Topical Analgesic, (BIOFREEZE EX), Apply 1 Application topically daily as needed (pain).,  Disp: , Rfl:    naloxone (NARCAN) nasal spray 4 mg/0.1 mL, Use one actuation per one nostril once as needed for respiratory depression from pain medication, Disp: 2 each, Rfl: 1   ondansetron (ZOFRAN-ODT) 4 MG disintegrating tablet, Take 1 tablet (4 mg total) by mouth every 8 (eight) hours as needed for nausea or vomiting. Please keep your December appointment for further refills. Thank you, Disp: 20 tablet, Rfl: 1   oxyCODONE (ROXICODONE) 15 MG immediate release tablet, Take 1 tablet (15 mg total) by mouth every 6 (six) hours., Disp: 120 tablet, Rfl: 0   oxyCODONE (ROXICODONE) 15 MG immediate release tablet,  Take 1 tablet (15 mg total) by mouth every 6 (six) hours., Disp: 120 tablet, Rfl: 0   PARoxetine (PAXIL) 40 MG tablet, TAKE 1 AND 1/2 TABLETS DAILY IN THE MORNING BY MOUTH, Disp: 135 tablet, Rfl: 1   pregabalin (LYRICA) 150 MG capsule, Take 1 capsule (150 mg total) by mouth in the morning, at noon, in the evening, and at bedtime., Disp: 360 capsule, Rfl: 0   pantoprazole (PROTONIX) 40 MG tablet, Take 1 tablet (40 mg total) by mouth daily., Disp: 30 tablet, Rfl: 0  EXAM:  VITALS per patient if applicable:  GENERAL: alert, oriented, appears well and in no acute distress  HEENT: atraumatic, conjunttiva clear, no obvious abnormalities on inspection of external nose and ears  NECK: normal movements of the head and neck  LUNGS: on inspection no signs of respiratory distress, breathing rate appears normal, no obvious gross SOB, gasping or wheezing  CV: no obvious cyanosis  MS: moves all visible extremities without noticeable abnormality  PSYCH/NEURO: pleasant and cooperative, no obvious depression or anxiety, speech and thought processing grossly intact  ASSESSMENT AND PLAN:  Discussed the following assessment and plan:  Chronic pain management.  Stable    recent drug screen done in April Refilled medication for 3 months.    We have strongly recommended she set up CPE soon    needs follow up labs and to address several health screenings.       I discussed the assessment and treatment plan with the patient. The patient was provided an opportunity to ask questions and all were answered. The patient agreed with the plan and demonstrated an understanding of the instructions.   The patient was advised to call back or seek an in-person evaluation if the symptoms worsen or if the condition fails to improve as anticipated.     Toni Peat, MD

## 2022-11-14 ENCOUNTER — Ambulatory Visit: Payer: Medicare HMO | Admitting: Family Medicine

## 2022-11-21 ENCOUNTER — Telehealth: Payer: Self-pay | Admitting: Psychiatry

## 2022-11-21 NOTE — Telephone Encounter (Signed)
Husband reported patient is in bed all day, hasn't showered in several days, crying and shaking, doesn't want him to even go outside because she doesn't want to be alone. Not sleeping much, he has to wait for her to go to sleep before he can go to sleep or she will be up all night. No new stressors. Only taking 60 mg Paxil and Lyrica 150 mg QID. Last seen 6/11, FU 7/23.  Husband requests that you reconsider Adderall, even a small dose. He said he would monitor it and that she seemed to do better with it. She has been very anxious previously about being able to get it due to the shortage.   No BZ on oxycodone if at all possible. Off Adderall since mid 2023.  Hold Adderalll until anxiety managed Disc sig risk tolerance with BZ given history of PTSD   Consider beta blocker

## 2022-11-21 NOTE — Telephone Encounter (Signed)
I'm sorry her anxiety is not getting better.  For her severe TR anxiety increase paroxetine to 2 of the 40 mg tablets daily.  This may take 4 weeks to be helpful.  This is as high as we were likely to be going with the dose.  She is on a high dose of Lyrica.  It does not appear that the increase from 3-4 daily has been helpful.  Therefore suggest she cut the dose back to 3 a day instead of 4 a day.

## 2022-11-21 NOTE — Telephone Encounter (Signed)
[name] ,Toni Collins's husband called and said that he has been giving Toni Collins her paxil as directed. However her anxiety is worse. She is crying uncontrollable and shaking. She won;t leave the house. He said this is the worse he has ever seen her He said she is miserable. Please help. Call steven at [phone].

## 2022-11-22 NOTE — Telephone Encounter (Signed)
Sent MyChart message. Talked with husband to let him know. He was in ER due to BP issues. Told him to call if he was unable to access in MyChart and I would review with him.

## 2022-12-03 ENCOUNTER — Telehealth: Payer: Self-pay | Admitting: Psychiatry

## 2022-12-03 NOTE — Telephone Encounter (Signed)
Husband reports patient's anxiety is no better and her shaking continues. He reports she is unable to function due to the shaking, is unable to hold anything and he has to feed her. On 6/27 patient was to increase Paxil to 80 mg and was told it would likely take 4 weeks to see benefit.  Husband reports she needs something now, that 4 weeks with her sx is too long. Patient was to also decrease Lyrica from 4 tablets to 3. Husband said she tried to reduce it, but feels that it is the only medication helping her at this point and she is taking 4 again.   "I'm sorry her anxiety is not getting better.  For her severe TR anxiety increase paroxetine to 2 of the 40 mg tablets daily.  This may take 4 weeks to be helpful.  This is as high as we were likely to be going with the dose.   She is on a high dose of Lyrica.  It does not appear that the increase from 3-4 daily has been helpful.  Therefore suggest she cut the dose back to 3 a day instead of 4 a day."

## 2022-12-03 NOTE — Telephone Encounter (Signed)
Pt's husband, [name] called at 11:50a.  He is on DPR.  He said pt started on Paxil two months ago.  But her her anxiety is worse than it's ever been.  She is shaking uncontrollably and she kicks and hits him all night in the bed.  He has to feed her because she is unsteady with her hands.    Next appt 7/10

## 2022-12-05 ENCOUNTER — Ambulatory Visit: Payer: Medicare HMO | Admitting: Psychiatry

## 2022-12-05 ENCOUNTER — Encounter: Payer: Self-pay | Admitting: Psychiatry

## 2022-12-05 DIAGNOSIS — G894 Chronic pain syndrome: Secondary | ICD-10-CM | POA: Diagnosis not present

## 2022-12-05 DIAGNOSIS — F39 Unspecified mood [affective] disorder: Secondary | ICD-10-CM

## 2022-12-05 DIAGNOSIS — F4310 Post-traumatic stress disorder, unspecified: Secondary | ICD-10-CM

## 2022-12-05 DIAGNOSIS — F5105 Insomnia due to other mental disorder: Secondary | ICD-10-CM

## 2022-12-05 DIAGNOSIS — F411 Generalized anxiety disorder: Secondary | ICD-10-CM

## 2022-12-05 DIAGNOSIS — F422 Mixed obsessional thoughts and acts: Secondary | ICD-10-CM

## 2022-12-05 MED ORDER — QUETIAPINE FUMARATE ER 150 MG PO TB24: ORAL_TABLET | ORAL | 0 refills | Status: DC
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
## 2022-12-05 NOTE — Patient Instructions (Addendum)
Taper paroxetine by reducing to 1 and 1/2 tablets daily for 1 week then reduce to 1 daily for 1 week, then reduce to 1/2 tablet at night for 1 week then stop it.  Start Seroquel XR 150 mg 1 at night for 1 week then 2 at night

## 2022-12-07 DIAGNOSIS — H5213 Myopia, bilateral: Secondary | ICD-10-CM | POA: Diagnosis not present

## 2022-12-08 DIAGNOSIS — H5213 Myopia, bilateral: Secondary | ICD-10-CM | POA: Diagnosis not present

## 2022-12-18 ENCOUNTER — Ambulatory Visit: Payer: Medicare HMO | Admitting: Psychiatry

## 2022-12-20 ENCOUNTER — Telehealth: Payer: Self-pay | Admitting: Psychiatry

## 2022-12-20 ENCOUNTER — Other Ambulatory Visit: Payer: Self-pay | Admitting: Psychiatry

## 2022-12-20 DIAGNOSIS — F422 Mixed obsessional thoughts and acts: Secondary | ICD-10-CM

## 2022-12-20 DIAGNOSIS — F4310 Post-traumatic stress disorder, unspecified: Secondary | ICD-10-CM

## 2022-12-20 DIAGNOSIS — F5105 Insomnia due to other mental disorder: Secondary | ICD-10-CM

## 2022-12-20 DIAGNOSIS — F39 Unspecified mood [affective] disorder: Secondary | ICD-10-CM

## 2022-12-20 DIAGNOSIS — F411 Generalized anxiety disorder: Secondary | ICD-10-CM

## 2022-12-20 MED ORDER — QUETIAPINE FUMARATE ER 150 MG PO TB24: 450.0000 mg | ORAL_TABLET | Freq: Every day | ORAL | 0 refills | Status: DC

## 2022-12-20 NOTE — Telephone Encounter (Signed)
Patient informed of new Rx and dosing.

## 2022-12-20 NOTE — Telephone Encounter (Signed)
See messages.  Called patient. She said she was told she could go up to 8 a day. She is doing much better and said she is out and afraid of getting in the same shape she was in.

## 2022-12-20 NOTE — Telephone Encounter (Signed)
From 7/10 visit:   Start Seroquel XR 150 mg 1 at night for 1 week then 2 at night

## 2022-12-20 NOTE — Telephone Encounter (Signed)
I sent rx for quetiapine XR 150 mg 3 at night.  The max dose is not 8 daily but 800 mg daily, for her info.  Usual dose for depression is what  she is taking , about 400 mg daily.  We can increase to 600 mg if needed but I would need to change pill size

## 2022-12-20 NOTE — Telephone Encounter (Signed)
Patient called in for refill on Quetiapine 150mg . States she is up to three a day now and she has run out. Ph: [phone] appt 8/28 Pharmacy [facility] [address] [place], [place]

## 2022-12-31 ENCOUNTER — Other Ambulatory Visit: Payer: Self-pay | Admitting: Psychiatry

## 2022-12-31 DIAGNOSIS — F422 Mixed obsessional thoughts and acts: Secondary | ICD-10-CM

## 2022-12-31 DIAGNOSIS — F39 Unspecified mood [affective] disorder: Secondary | ICD-10-CM

## 2022-12-31 DIAGNOSIS — F5105 Insomnia due to other mental disorder: Secondary | ICD-10-CM

## 2022-12-31 DIAGNOSIS — F411 Generalized anxiety disorder: Secondary | ICD-10-CM

## 2022-12-31 DIAGNOSIS — F4310 Post-traumatic stress disorder, unspecified: Secondary | ICD-10-CM

## 2023-01-11 ENCOUNTER — Encounter: Payer: Self-pay | Admitting: Family Medicine

## 2023-01-11 ENCOUNTER — Other Ambulatory Visit: Payer: Self-pay

## 2023-01-11 DIAGNOSIS — F5105 Insomnia due to other mental disorder: Secondary | ICD-10-CM

## 2023-01-11 DIAGNOSIS — F39 Unspecified mood [affective] disorder: Secondary | ICD-10-CM

## 2023-01-11 DIAGNOSIS — F422 Mixed obsessional thoughts and acts: Secondary | ICD-10-CM

## 2023-01-11 DIAGNOSIS — F411 Generalized anxiety disorder: Secondary | ICD-10-CM

## 2023-01-11 DIAGNOSIS — F4310 Post-traumatic stress disorder, unspecified: Secondary | ICD-10-CM

## 2023-01-11 MED ORDER — OXYCODONE HCL 15 MG PO TABS: 15.0000 mg | ORAL_TABLET | Freq: Four times a day (QID) | ORAL | 0 refills | Status: DC

## 2023-01-11 MED ORDER — QUETIAPINE FUMARATE ER 300 MG PO TB24: 600.0000 mg | ORAL_TABLET | Freq: Every day | ORAL | 0 refills | Status: DC

## 2023-01-11 NOTE — Telephone Encounter (Signed)
I sent in the Oxycodone to the [facility] [address].   Kristian Covey MD [facility]

## 2023-01-23 ENCOUNTER — Ambulatory Visit (INDEPENDENT_AMBULATORY_CARE_PROVIDER_SITE_OTHER): Payer: Medicare HMO | Admitting: Psychiatry

## 2023-01-23 ENCOUNTER — Other Ambulatory Visit: Payer: Self-pay | Admitting: Psychiatry

## 2023-01-23 ENCOUNTER — Encounter: Payer: Self-pay | Admitting: Psychiatry

## 2023-01-23 DIAGNOSIS — F39 Unspecified mood [affective] disorder: Secondary | ICD-10-CM | POA: Diagnosis not present

## 2023-01-23 DIAGNOSIS — F4310 Post-traumatic stress disorder, unspecified: Secondary | ICD-10-CM

## 2023-01-23 DIAGNOSIS — F422 Mixed obsessional thoughts and acts: Secondary | ICD-10-CM | POA: Diagnosis not present

## 2023-01-23 DIAGNOSIS — F902 Attention-deficit hyperactivity disorder, combined type: Secondary | ICD-10-CM | POA: Diagnosis not present

## 2023-01-23 DIAGNOSIS — F5105 Insomnia due to other mental disorder: Secondary | ICD-10-CM | POA: Diagnosis not present

## 2023-01-23 DIAGNOSIS — F411 Generalized anxiety disorder: Secondary | ICD-10-CM

## 2023-01-23 DIAGNOSIS — G894 Chronic pain syndrome: Secondary | ICD-10-CM | POA: Diagnosis not present

## 2023-01-23 MED ORDER — PREGABALIN 150 MG PO CAPS: 150.0000 mg | ORAL_CAPSULE | Freq: Four times a day (QID) | ORAL | 0 refills | Status: DC

## 2023-01-23 MED ORDER — QUETIAPINE FUMARATE ER 200 MG PO TB24: ORAL_TABLET | ORAL | 1 refills | Status: DC

## 2023-01-23 NOTE — Telephone Encounter (Signed)
200 mg and 400 mg
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
## 2023-01-24 ENCOUNTER — Other Ambulatory Visit: Payer: Self-pay | Admitting: Psychiatry

## 2023-01-24 DIAGNOSIS — F411 Generalized anxiety disorder: Secondary | ICD-10-CM

## 2023-01-24 DIAGNOSIS — F4310 Post-traumatic stress disorder, unspecified: Secondary | ICD-10-CM

## 2023-01-24 DIAGNOSIS — F422 Mixed obsessional thoughts and acts: Secondary | ICD-10-CM

## 2023-01-24 DIAGNOSIS — F39 Unspecified mood [affective] disorder: Secondary | ICD-10-CM

## 2023-01-24 MED ORDER — QUETIAPINE FUMARATE ER 400 MG PO TB24: 400.0000 mg | ORAL_TABLET | Freq: Every day | ORAL | 0 refills | Status: DC

## 2023-01-24 MED ORDER — QUETIAPINE FUMARATE ER 200 MG PO TB24: 200.0000 mg | ORAL_TABLET | Freq: Every morning | ORAL | 0 refills | Status: DC

## 2023-02-08 ENCOUNTER — Encounter: Payer: Self-pay | Admitting: Family Medicine

## 2023-02-08 ENCOUNTER — Ambulatory Visit (INDEPENDENT_AMBULATORY_CARE_PROVIDER_SITE_OTHER): Payer: Medicare HMO | Admitting: Family Medicine

## 2023-02-08 VITALS — BP 130/84 | HR 100 | Temp 98.2°F | Ht 62.0 in | Wt 181.0 lb

## 2023-02-08 DIAGNOSIS — E785 Hyperlipidemia, unspecified: Secondary | ICD-10-CM

## 2023-02-08 DIAGNOSIS — R7303 Prediabetes: Secondary | ICD-10-CM

## 2023-02-08 DIAGNOSIS — R635 Abnormal weight gain: Secondary | ICD-10-CM | POA: Diagnosis not present

## 2023-02-08 DIAGNOSIS — G894 Chronic pain syndrome: Secondary | ICD-10-CM

## 2023-02-08 LAB — COMPREHENSIVE METABOLIC PANEL
ALT: 21 U/L (ref 0–35)
AST: 20 U/L (ref 0–37)
Albumin: 3.6 g/dL (ref 3.5–5.2)
Alkaline Phosphatase: 75 U/L (ref 39–117)
BUN: 18 mg/dL (ref 6–23)
CO2: 25 meq/L (ref 19–32)
Calcium: 8.6 mg/dL (ref 8.4–10.5)
Chloride: 108 meq/L (ref 96–112)
Creatinine, Ser: 0.83 mg/dL (ref 0.40–1.20)
GFR: 79.71 mL/min (ref >60.00)
Glucose, Bld: 98 mg/dL (ref 70–99)
Potassium: 3.6 meq/L (ref 3.5–5.1)
Sodium: 142 meq/L (ref 135–145)
Total Bilirubin: 0.3 mg/dL (ref 0.2–1.2)
Total Protein: 6.3 g/dL (ref 6.0–8.3)

## 2023-02-08 LAB — HEMOGLOBIN A1C: Hgb A1c MFr Bld: 5.8 % (ref 4.6–6.5)

## 2023-02-08 LAB — TSH: TSH: 2.66 u[IU]/mL (ref 0.35–5.50)

## 2023-02-08 LAB — LIPID PANEL
Cholesterol: 201 mg/dL — ABNORMAL HIGH (ref 0–200)
HDL: 58.6 mg/dL (ref >39.00)
LDL Cholesterol: 109 mg/dL — ABNORMAL HIGH (ref 0–99)
NonHDL: 142.74
Total CHOL/HDL Ratio: 3
Triglycerides: 169 mg/dL — ABNORMAL HIGH (ref 0.0–149.0)
VLDL: 33.8 mg/dL (ref 0.0–40.0)

## 2023-02-08 MED ORDER — OXYCODONE HCL 15 MG PO TABS: 15.0000 mg | ORAL_TABLET | Freq: Four times a day (QID) | ORAL | 0 refills | Status: DC | PRN

## 2023-02-08 MED ORDER — OXYCODONE HCL 15 MG PO TABS: 15.0000 mg | ORAL_TABLET | Freq: Four times a day (QID) | ORAL | 0 refills | Status: DC

## 2023-02-08 NOTE — Patient Instructions (Signed)
We might want to consider medication for weight loss such as Wegovy or Zepbound.   Set up repeat mammogram.

## 2023-02-08 NOTE — Progress Notes (Signed)
Established Patient Office Visit  Subjective   Patient ID: Toni Collins, female    DOB: 09/02/1965  Age: 57 y.o. MRN: [id number]  Chief Complaint  Patient presents with   Pain Management   Weight Check    HPI   Toni Collins has chronic problems including history of chronic anxiety, recurrent depression, ADD, fibromyalgia, chronic back pain, chronic insomnia, hyperlipidemia, history of prediabetes range blood sugars, history of GERD with severe esophagitis, posttraumatic stress disorder.  She is followed by psychiatry and currently on high-dose Seroquel and also takes Lyrica.  She remains on oxycodone 15 mg 4 times daily for her chronic pain.  Still has some breakthrough pain at times.  Struggling with weight gain.  She has not tried any specific weight loss plans.  Inquiring about medication options.  Limited in activity because of her chronic pain.  Has had prediabetes range blood sugars in the past also severe hyperlipidemia.  Needs follow-up.  No recent TSH.  Current BMI around 33.  No recent mammogram.  Indication for chronic opioid: Chronic pain syndrome/fibromyalgia Medication and dose: Oxycodone 15 mg every 6 hours # pills per month: 120 Last UDS date: 4/24 Opioid Treatment Agreement signed (Y/N): y Opioid Treatment Agreement last reviewed with patient: No red flags  [facility] reviewed this encounter (include red flags): None   Past Medical History:  Diagnosis Date   ADHD    Anemia    Anxiety    Arthritis    Colon polyps    DEPRESSION 09/02/2008   Fibromyalgia    GERD (gastroesophageal reflux disease)    GRIEF REACTION 09/30/2009   INSOMNIA, CHRONIC 09/02/2008   PREDIABETES 03/10/2009   PTSD (post-traumatic stress disorder)    Past Surgical History:  Procedure Laterality Date   ABDOMINAL HYSTERECTOMY  05/28/2000   TAH   COLONOSCOPY     DIAGNOSTIC LAPAROSCOPY  1993, 1994, 1998, 2000   x4   TMJ ARTHROPLASTY     x2   UPPER GASTROINTESTINAL ENDOSCOPY      UPPER GI ENDOSCOPY N/A 02/02/2022   Procedure: UPPER GI ENDOSCOPY;  Surgeon: Gaynelle Adu, MD;  Location: [facility];  Service: General;  Laterality: N/A;   XI ROBOTIC ASSISTED HIATAL HERNIA REPAIR N/A 02/02/2022   Procedure: XI ROBOTIC ASSISTED HIATAL HERNIA REPAIR WITH PARTAL WRAP, INSERTION OF CHEST TUBE;  Surgeon: Gaynelle Adu, MD;  Location: [facility];  Service: General;  Laterality: N/A;    reports that she has never smoked. She has never used smokeless tobacco. She reports that she does not drink alcohol and does not use drugs. family history includes Arthritis in her mother; Colon polyps in her father and sister; Diabetes in her maternal aunt and maternal grandmother; Lung disease in her father. No Known Allergies   Review of Systems  Eyes:  Negative for blurred vision.  Respiratory:  Negative for shortness of breath.   Cardiovascular:  Negative for chest pain.  Gastrointestinal:  Negative for abdominal pain.  Musculoskeletal:  Positive for back pain and myalgias.  Neurological:  Negative for dizziness, weakness and headaches.      Objective:     BP 130/84 (BP Location: Left Arm, Cuff Size: Normal)   Pulse 100   Temp 98.2 F (36.8 C) (Oral)   Ht 5\' 2"  (1.575 m)   Wt 181 lb (82.1 kg)   SpO2 98%   BMI 33.11 kg/m  BP Readings from Last 3 Encounters:  02/08/23 130/84  09/13/22 110/82  06/20/22 124/80   Wt Readings from Last  3 Encounters:  02/08/23 181 lb (82.1 kg)  11/13/22 179 lb (81.2 kg)  09/13/22 179 lb (81.2 kg)      Physical Exam Vitals reviewed.  Constitutional:      Appearance: She is well-developed.  Eyes:     Pupils: Pupils are equal, round, and reactive to light.  Neck:     Thyroid: No thyromegaly.     Vascular: No JVD.  Cardiovascular:     Rate and Rhythm: Normal rate and regular rhythm.     Heart sounds:     No gallop.  Pulmonary:     Effort: Pulmonary effort is normal. No respiratory distress.     Breath sounds: Normal breath sounds. No wheezing or  rales.  Musculoskeletal:     Cervical back: Neck supple.  Neurological:     Mental Status: She is alert.      No results found for any visits on 02/08/23.  Last CBC Lab Results  Component Value Date   WBC 7.9 06/18/2022   HGB 13.0 06/18/2022   HCT 40.5 06/18/2022   MCV 72.3 (L) 06/18/2022   MCH 23.2 (L) 06/18/2022   RDW 20.9 (H) 06/18/2022   PLT 297 06/18/2022   Last metabolic panel Lab Results  Component Value Date   GLUCOSE 111 (H) 06/18/2022   NA 139 06/18/2022   K 3.6 06/18/2022   CL 108 06/18/2022   CO2 17 (L) 06/18/2022   BUN 15 06/18/2022   CREATININE 0.79 06/18/2022   GFRNONAA >60 06/18/2022   CALCIUM 9.9 06/18/2022   PHOS 3.2 02/03/2022   PROT 9.0 (H) 06/18/2022   ALBUMIN 4.8 06/18/2022   BILITOT 0.5 06/18/2022   ALKPHOS 102 06/18/2022   AST 22 06/18/2022   ALT 20 06/18/2022   ANIONGAP 14 06/18/2022      The ASCVD Risk score (Arnett DK, et al., 2019) failed to calculate for the following reasons:   Cannot find a previous HDL lab   Cannot find a previous total cholesterol lab    Assessment & Plan:   #1 chronic pain syndrome.  Patient stable on oxycodone 15 mg every 6 hours.  Recent urine drug screen in April reviewed with no concerns.  Refill her medication for 3 months.  Recommend 62-month follow-up.  #2 weight gain/obesity with BMI around 33.  Other cord morbidities include history of prediabetes, hyperlipidemia, borderline elevated blood pressure.  We discussed nonpharmacologic strategies for weight loss.  Will check labs including A1c and TSH.  We did discuss possible GLP-1 medications but will await lab results first  #3 history of hyperlipidemia.  Recheck lipids today.  Consider statin based on results  #4 initial elevated blood pressure today.  Improved some after rest.  Continue close home monitoring.  Work on weight loss as above.   Return in about 3 months (around 05/10/2023).    [name], MD

## 2023-02-11 ENCOUNTER — Encounter: Payer: Self-pay | Admitting: Family Medicine

## 2023-02-11 ENCOUNTER — Ambulatory Visit (INDEPENDENT_AMBULATORY_CARE_PROVIDER_SITE_OTHER): Payer: Medicare HMO | Admitting: Family Medicine

## 2023-02-11 VITALS — BP 134/86 | HR 95 | Temp 98.5°F | Ht 62.0 in | Wt 179.7 lb

## 2023-02-11 DIAGNOSIS — R413 Other amnesia: Secondary | ICD-10-CM

## 2023-02-11 DIAGNOSIS — E669 Obesity, unspecified: Secondary | ICD-10-CM | POA: Insufficient documentation

## 2023-02-11 DIAGNOSIS — E785 Hyperlipidemia, unspecified: Secondary | ICD-10-CM | POA: Diagnosis not present

## 2023-02-11 NOTE — Patient Instructions (Signed)
Consider calorie tracking app such as My Fitness Pal

## 2023-02-11 NOTE — Progress Notes (Signed)
Established Patient Office Visit  Subjective   Patient ID: Toni Collins, female    DOB: 12/06/1965  Age: 57 y.o. MRN: [id number]  Chief Complaint  Patient presents with   Results    HPI   Toni Collins seen for follow-up to discuss recent labs.  She had A1c of 5.8%.  Upwards were up somewhat with total cholesterol of 201, triglycerides 169, HDL 58, LDL 109.  Calculated 10-year risk for CAD 1.9%.  No family history of premature CAD.  She had raised the issue of possible GLP-1 medication for weight loss.  Her BMI is around 33.  We explained her insurance would not likely cover in the absence of history of cardiovascular disease or diabetes but she will check  She has had some concerns about possible short-term memory issues.  She apparently has 4 maternal aunts that were diagnosed with Alzheimer's disease.  She is not aware of either of her parents having Alzheimer's.  Past Medical History:  Diagnosis Date   ADHD    Anemia    Anxiety    Arthritis    Colon polyps    DEPRESSION 09/02/2008   Fibromyalgia    GERD (gastroesophageal reflux disease)    GRIEF REACTION 09/30/2009   INSOMNIA, CHRONIC 09/02/2008   PREDIABETES 03/10/2009   PTSD (post-traumatic stress disorder)    Past Surgical History:  Procedure Laterality Date   ABDOMINAL HYSTERECTOMY  05/28/2000   TAH   COLONOSCOPY     DIAGNOSTIC LAPAROSCOPY  1993, 1994, 1998, 2000   x4   TMJ ARTHROPLASTY     x2   UPPER GASTROINTESTINAL ENDOSCOPY     UPPER GI ENDOSCOPY N/A 02/02/2022   Procedure: UPPER GI ENDOSCOPY;  Surgeon: Gaynelle Adu, MD;  Location: [facility];  Service: General;  Laterality: N/A;   XI ROBOTIC ASSISTED HIATAL HERNIA REPAIR N/A 02/02/2022   Procedure: XI ROBOTIC ASSISTED HIATAL HERNIA REPAIR WITH PARTAL WRAP, INSERTION OF CHEST TUBE;  Surgeon: Gaynelle Adu, MD;  Location: [facility];  Service: General;  Laterality: N/A;    reports that she has never smoked. She has never used smokeless tobacco. She reports that she  does not drink alcohol and does not use drugs. family history includes Arthritis in her mother; Colon polyps in her father and sister; Diabetes in her maternal aunt and maternal grandmother; Lung disease in her father. No Known Allergies  Review of Systems  Constitutional:  Negative for malaise/fatigue.  Eyes:  Negative for blurred vision.  Respiratory:  Negative for shortness of breath.   Cardiovascular:  Negative for chest pain.  Neurological:  Negative for dizziness, weakness and headaches.      Objective:     BP 134/86 (BP Location: Left Arm, Patient Position: Sitting, Cuff Size: Large)   Pulse 95   Temp 98.5 F (36.9 C) (Oral)   Ht 5\' 2"  (1.575 m)   Wt 179 lb 11.2 oz (81.5 kg)   SpO2 96%   BMI 32.87 kg/m  BP Readings from Last 3 Encounters:  02/11/23 134/86  02/08/23 130/84  09/13/22 110/82   Wt Readings from Last 3 Encounters:  02/11/23 179 lb 11.2 oz (81.5 kg)  02/08/23 181 lb (82.1 kg)  11/13/22 179 lb (81.2 kg)      Physical Exam Vitals reviewed.  Constitutional:      General: She is not in acute distress.    Appearance: Normal appearance. She is well-developed. She is not ill-appearing.  Eyes:     Pupils: Pupils are equal, round, and  reactive to light.  Neck:     Thyroid: No thyromegaly.     Vascular: No JVD.  Cardiovascular:     Rate and Rhythm: Normal rate and regular rhythm.     Heart sounds:     No gallop.  Pulmonary:     Effort: Pulmonary effort is normal. No respiratory distress.     Breath sounds: Normal breath sounds. No wheezing or rales.  Musculoskeletal:     Cervical back: Neck supple.  Neurological:     Mental Status: She is alert.      No results found for any visits on 02/11/23.  Last CBC Lab Results  Component Value Date   WBC 7.9 06/18/2022   HGB 13.0 06/18/2022   HCT 40.5 06/18/2022   MCV 72.3 (L) 06/18/2022   MCH 23.2 (L) 06/18/2022   RDW 20.9 (H) 06/18/2022   PLT 297 06/18/2022   Last metabolic panel Lab Results   Component Value Date   GLUCOSE 98 02/08/2023   NA 142 02/08/2023   K 3.6 02/08/2023   CL 108 02/08/2023   CO2 25 02/08/2023   BUN 18 02/08/2023   CREATININE 0.83 02/08/2023   GFR 79.71 02/08/2023   CALCIUM 8.6 02/08/2023   PHOS 3.2 02/03/2022   PROT 6.3 02/08/2023   ALBUMIN 3.6 02/08/2023   BILITOT 0.3 02/08/2023   ALKPHOS 75 02/08/2023   AST 20 02/08/2023   ALT 21 02/08/2023   ANIONGAP 14 06/18/2022   Last lipids Lab Results  Component Value Date   CHOL 201 (H) 02/08/2023   HDL 58.60 02/08/2023   LDLCALC 109 (H) 02/08/2023   LDLDIRECT 206.0 02/06/2013   TRIG 169.0 (H) 02/08/2023   CHOLHDL 3 02/08/2023   Last hemoglobin A1c Lab Results  Component Value Date   HGBA1C 5.8 02/08/2023   Last thyroid functions Lab Results  Component Value Date   TSH 2.66 02/08/2023      The 10-year ASCVD risk score (Arnett DK, et al., 2019) is: 1.9%    Assessment & Plan:   #1 obesity with BMI 33.  Does have hyperlipidemia as below.  She specifically had raise issue with possible GLP-1 medication.  We discussed healthy weight loss strategies.  We spent quite some time discussing important issues of exercise and overall calorie restriction.  She states that she tends to snack throughout the day.  Does not really have any good accurate count of current calories.  We have suggested calorie counting app such as my fitness pal to help track calories -Also suggest that she try to step up her activities is much as tolerated by her chronic pain -Try to reduce snacking especially of high glycemic foods.  #2 dyslipidemia.  10-year risk of CAD 1.9%.  Do not recommend statin at this time.  Focus on low saturated fat diet.  We discussed this in some detail  #3 concern for possible short-term memory deficits.  2 out of 3 3 word recall..  She has seen psychologist in the past and will check with their office regarding whether they do any neurocognitive testing.  If not consider referral to  neuropsychologist for further more in-depth screening  [name], MD

## 2023-03-04 ENCOUNTER — Encounter: Payer: Self-pay | Admitting: Family Medicine

## 2023-03-04 ENCOUNTER — Ambulatory Visit (INDEPENDENT_AMBULATORY_CARE_PROVIDER_SITE_OTHER): Payer: Medicare HMO | Admitting: Family Medicine

## 2023-03-04 VITALS — BP 110/88 | HR 105 | Temp 98.3°F | Ht 62.0 in | Wt 172.1 lb

## 2023-03-04 DIAGNOSIS — F419 Anxiety disorder, unspecified: Secondary | ICD-10-CM | POA: Diagnosis not present
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
## 2023-03-05 ENCOUNTER — Ambulatory Visit: Payer: Medicare HMO | Admitting: Family Medicine

## 2023-03-05 NOTE — Telephone Encounter (Signed)
Reach out to pt yesterday. Appt was canceled.

## 2023-03-08 ENCOUNTER — Encounter: Payer: Self-pay | Admitting: Family Medicine

## 2023-03-26 ENCOUNTER — Encounter: Payer: Self-pay | Admitting: Psychiatry

## 2023-03-26 ENCOUNTER — Telehealth: Payer: Self-pay | Admitting: Psychiatry

## 2023-03-26 ENCOUNTER — Ambulatory Visit (INDEPENDENT_AMBULATORY_CARE_PROVIDER_SITE_OTHER): Payer: Medicare HMO | Admitting: Psychiatry

## 2023-03-26 DIAGNOSIS — F902 Attention-deficit hyperactivity disorder, combined type: Secondary | ICD-10-CM | POA: Diagnosis not present

## 2023-03-26 DIAGNOSIS — F39 Unspecified mood [affective] disorder: Secondary | ICD-10-CM

## 2023-03-26 DIAGNOSIS — F4310 Post-traumatic stress disorder, unspecified: Secondary | ICD-10-CM

## 2023-03-26 DIAGNOSIS — G894 Chronic pain syndrome: Secondary | ICD-10-CM | POA: Diagnosis not present

## 2023-03-26 DIAGNOSIS — F411 Generalized anxiety disorder: Secondary | ICD-10-CM | POA: Diagnosis not present

## 2023-03-26 DIAGNOSIS — F5105 Insomnia due to other mental disorder: Secondary | ICD-10-CM | POA: Diagnosis not present

## 2023-03-26 DIAGNOSIS — F422 Mixed obsessional thoughts and acts: Secondary | ICD-10-CM

## 2023-03-26 MED ORDER — QUETIAPINE FUMARATE ER 400 MG PO TB24
400.0000 mg | ORAL_TABLET | Freq: Two times a day (BID) | ORAL | 1 refills | Status: DC
Start: 2023-03-26 — End: 2023-04-18

## 2023-03-26 MED ORDER — PREGABALIN 150 MG PO CAPS
150.0000 mg | ORAL_CAPSULE | Freq: Four times a day (QID) | ORAL | 3 refills | Status: DC
Start: 2023-03-26 — End: 2023-07-31
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
## 2023-03-26 NOTE — Telephone Encounter (Signed)
Toni Collins Toni Collins called at 2:15 to report that the pharmacy told her that insurance would only cover 400mg  of the Lyrica.  She requested a new prescription for 100gm 4/day.  But that is not her dose.  Does it perhaps just need a PA?  If it is not covered at her dose, is a dose change appropriate.  Please let her know what she should do.

## 2023-03-26 NOTE — Telephone Encounter (Signed)
[name] called back and said she will just continue to use [facility] so leave the prescription as it should be.  Insurance won't come into play using [facility]

## 2023-03-26 NOTE — Telephone Encounter (Signed)
Shell said that insurance won't pay for her dose 150 mg 4xdaily. She has been using [facility]. She would like to know her options. I called the pharmacy to see if it needed a PA, the pharmacist said that it was too early to know. Lf 10/07

## 2023-03-27 NOTE — Telephone Encounter (Signed)
Noted. Thanks.

## 2023-04-17 ENCOUNTER — Telehealth: Payer: Self-pay | Admitting: Psychiatry

## 2023-04-17 DIAGNOSIS — F39 Unspecified mood [affective] disorder: Secondary | ICD-10-CM

## 2023-04-17 DIAGNOSIS — F411 Generalized anxiety disorder: Secondary | ICD-10-CM

## 2023-04-17 DIAGNOSIS — F422 Mixed obsessional thoughts and acts: Secondary | ICD-10-CM

## 2023-04-17 DIAGNOSIS — F4310 Post-traumatic stress disorder, unspecified: Secondary | ICD-10-CM

## 2023-04-18 ENCOUNTER — Ambulatory Visit: Payer: Medicare HMO | Admitting: Psychiatry

## 2023-04-18 ENCOUNTER — Encounter: Payer: Self-pay | Admitting: Psychiatry

## 2023-04-18 ENCOUNTER — Other Ambulatory Visit: Payer: Self-pay

## 2023-04-18 DIAGNOSIS — F5105 Insomnia due to other mental disorder: Secondary | ICD-10-CM | POA: Diagnosis not present

## 2023-04-18 DIAGNOSIS — F422 Mixed obsessional thoughts and acts: Secondary | ICD-10-CM

## 2023-04-18 DIAGNOSIS — F411 Generalized anxiety disorder: Secondary | ICD-10-CM

## 2023-04-18 DIAGNOSIS — F39 Unspecified mood [affective] disorder: Secondary | ICD-10-CM

## 2023-04-18 DIAGNOSIS — F902 Attention-deficit hyperactivity disorder, combined type: Secondary | ICD-10-CM | POA: Diagnosis not present

## 2023-04-18 DIAGNOSIS — F4310 Post-traumatic stress disorder, unspecified: Secondary | ICD-10-CM

## 2023-04-18 DIAGNOSIS — G894 Chronic pain syndrome: Secondary | ICD-10-CM | POA: Diagnosis not present

## 2023-04-18 MED ORDER — AMPHETAMINE-DEXTROAMPHETAMINE 30 MG PO TABS
15.0000 mg | ORAL_TABLET | Freq: Two times a day (BID) | ORAL | 0 refills | Status: DC
Start: 2023-04-18 — End: 2023-05-16

## 2023-04-18 MED ORDER — QUETIAPINE FUMARATE ER 400 MG PO TB24: 400.0000 mg | ORAL_TABLET | Freq: Two times a day (BID) | ORAL | 1 refills | Status: DC

## 2023-04-18 NOTE — Telephone Encounter (Signed)
Sent Seroquel xr 400 mg to requested pharmacy.

## 2023-04-18 NOTE — Telephone Encounter (Signed)
Next appt is 07/31/23. Toni Collins was seen today. Requesting a refill on her Seroquel XR 400 mg called to:  [id number] - [place], [place] - [address]   Phone: [phone]  Fax: [phone]
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
## 2023-04-21 ENCOUNTER — Other Ambulatory Visit: Payer: Self-pay | Admitting: Psychiatry

## 2023-04-21 DIAGNOSIS — F411 Generalized anxiety disorder: Secondary | ICD-10-CM

## 2023-04-21 DIAGNOSIS — F4310 Post-traumatic stress disorder, unspecified: Secondary | ICD-10-CM

## 2023-04-21 DIAGNOSIS — F422 Mixed obsessional thoughts and acts: Secondary | ICD-10-CM

## 2023-04-21 DIAGNOSIS — F39 Unspecified mood [affective] disorder: Secondary | ICD-10-CM

## 2023-05-16 ENCOUNTER — Telehealth: Payer: Self-pay | Admitting: Psychiatry

## 2023-05-16 ENCOUNTER — Other Ambulatory Visit: Payer: Self-pay

## 2023-05-16 DIAGNOSIS — F902 Attention-deficit hyperactivity disorder, combined type: Secondary | ICD-10-CM

## 2023-05-16 MED ORDER — AMPHETAMINE-DEXTROAMPHETAMINE 30 MG PO TABS: 15.0000 mg | ORAL_TABLET | Freq: Two times a day (BID) | ORAL | 0 refills | Status: DC

## 2023-05-16 NOTE — Telephone Encounter (Signed)
Pt called asking for a refill on her adderall 30 mg. Pharmacy is [facility] in [place]

## 2023-05-16 NOTE — Telephone Encounter (Signed)
Pedned adderall 30 mg .5 mg twice daily  to requested pharmacy.

## 2023-06-04 ENCOUNTER — Encounter: Payer: Self-pay | Admitting: Family Medicine

## 2023-06-04 ENCOUNTER — Ambulatory Visit (INDEPENDENT_AMBULATORY_CARE_PROVIDER_SITE_OTHER): Payer: Medicare HMO | Admitting: Family Medicine

## 2023-06-04 VITALS — BP 122/80 | HR 94 | Temp 98.2°F | Wt 163.6 lb

## 2023-06-04 DIAGNOSIS — Z23 Encounter for immunization: Secondary | ICD-10-CM

## 2023-06-04 DIAGNOSIS — G894 Chronic pain syndrome: Secondary | ICD-10-CM

## 2023-06-04 DIAGNOSIS — M797 Fibromyalgia: Secondary | ICD-10-CM | POA: Diagnosis not present

## 2023-06-04 NOTE — Progress Notes (Signed)
 Toni Collins

## 2023-06-04 NOTE — Progress Notes (Signed)
 Established Patient Office Visit  Subjective   Patient ID: Toni Collins, female    DOB: Sep 12, 1965  Age: 58 y.o. MRN: [id number]  No chief complaint on file.   HPI   Toni Collins is here today for routine chronic medical follow-up.  Chronic pain syndrome.  She has had years of chronic back pain and chronic fibromyalgia pain.  Has been on oxycodone  for several years.  Tried on multiple other medications as previously outlined without good control.  Still has ongoing pain daily but manages fairly well.  She made some very positive dietary changes since last visit and lost about 16 pounds.  Feels better overall.  Trying to be more active.  Unfortunately is having some recurrent dysphagia symptoms.  History of previous Nissen fundoplication for severe reflux esophagitis.  Plans to follow-up with surgeon soon.  Has good appetite.  Continues to be followed by psychiatry for chronic depression and anxiety symptoms  Past Medical History:  Diagnosis Date   ADHD    Anemia    Anxiety    Arthritis    Colon polyps    DEPRESSION 09/02/2008   Fibromyalgia    GERD (gastroesophageal reflux disease)    GRIEF REACTION 09/30/2009   INSOMNIA, CHRONIC 09/02/2008   PREDIABETES 03/10/2009   PTSD (post-traumatic stress disorder)    Past Surgical History:  Procedure Laterality Date   ABDOMINAL HYSTERECTOMY  05/28/2000   TAH   COLONOSCOPY     DIAGNOSTIC LAPAROSCOPY  1993, 1994, 1998, 2000   x4   TMJ ARTHROPLASTY     x2   UPPER GASTROINTESTINAL ENDOSCOPY     UPPER GI ENDOSCOPY N/A 02/02/2022   Procedure: UPPER GI ENDOSCOPY;  Surgeon: Tanda Locus, MD;  Location: [facility];  Service: General;  Laterality: N/A;   XI ROBOTIC ASSISTED HIATAL HERNIA REPAIR N/A 02/02/2022   Procedure: XI ROBOTIC ASSISTED HIATAL HERNIA REPAIR WITH PARTAL WRAP, INSERTION OF CHEST TUBE;  Surgeon: Tanda Locus, MD;  Location: [facility];  Service: General;  Laterality: N/A;    reports that she has never smoked. She has never  used smokeless tobacco. She reports that she does not drink alcohol and does not use drugs. family history includes Arthritis in her mother; Colon polyps in her father and sister; Diabetes in her maternal aunt and maternal grandmother; Lung disease in her father. No Known Allergies  Review of Systems  Constitutional:  Positive for weight loss. Negative for chills, fever and malaise/fatigue.  Eyes:  Negative for blurred vision.  Respiratory:  Negative for shortness of breath.   Cardiovascular:  Negative for chest pain.  Musculoskeletal:  Positive for back pain.  Neurological:  Negative for dizziness, weakness and headaches.      Objective:     BP 122/80 (BP Location: Left Arm, Cuff Size: Normal)   Pulse 94   Temp 98.2 F (36.8 C) (Oral)   Wt 163 lb 9.6 oz (74.2 kg)   SpO2 97%   BMI 29.92 kg/m  BP Readings from Last 3 Encounters:  06/04/23 122/80  03/04/23 110/88  02/11/23 134/86   Wt Readings from Last 3 Encounters:  06/04/23 163 lb 9.6 oz (74.2 kg)  03/04/23 172 lb 1.6 oz (78.1 kg)  02/11/23 179 lb 11.2 oz (81.5 kg)      Physical Exam Vitals reviewed.  Constitutional:      General: She is not in acute distress.    Appearance: She is not ill-appearing.  Cardiovascular:     Rate and Rhythm: Normal rate  and regular rhythm.  Pulmonary:     Effort: Pulmonary effort is normal.     Breath sounds: Normal breath sounds.  Neurological:     Mental Status: She is alert.      No results found for any visits on 06/04/23.    The 10-year ASCVD risk score (Arnett DK, et al., 2019) is: 1.7%    Assessment & Plan:   #1 intentional weight loss.  Congratulated on that.  We suggest that she consider free app such as my fitness pal to help track macronutrients and calories.  Continue weight loss efforts.  #2 chronic back and fibromyalgia pain.  Maintained on oxycodone  15 mg every 6 hours.  Tolerating well.  Will be due for refills in a few days.  Will provide 53-month refills.   Last drug screen was in April.  PDMP reviewed with no concerns.  Patient had had not flu vaccine and this was offered and she consented today  Return in about 3 months (around 09/02/2023).    Wolm Scarlet, MD

## 2023-06-05 ENCOUNTER — Ambulatory Visit: Payer: Medicare HMO | Admitting: Family Medicine

## 2023-06-06 MED ORDER — OXYCODONE HCL 15 MG PO TABS: 15.0000 mg | ORAL_TABLET | Freq: Four times a day (QID) | ORAL | 0 refills | Status: DC

## 2023-06-06 NOTE — Telephone Encounter (Signed)
 I sent in her Oxycodone  to [facility], but we really need to try to minimize changing pharmacies as much as possible-- to avoid confusion    I do realize that availability can sometimes be a problem.  Wolm LELON Scarlet MD [facility]

## 2023-07-03 ENCOUNTER — Encounter: Payer: Self-pay | Admitting: Family Medicine

## 2023-07-04 MED ORDER — OXYCODONE HCL 15 MG PO TABS: 15.0000 mg | ORAL_TABLET | Freq: Four times a day (QID) | ORAL | 0 refills | Status: DC | PRN

## 2023-07-04 MED ORDER — OXYCODONE HCL 15 MG PO TABS: 15.0000 mg | ORAL_TABLET | Freq: Four times a day (QID) | ORAL | 0 refills | Status: DC

## 2023-07-11 ENCOUNTER — Encounter: Payer: Self-pay | Admitting: Family Medicine

## 2023-07-12 MED ORDER — ONDANSETRON 8 MG PO TBDP: ORAL_TABLET | ORAL | 0 refills | Status: DC

## 2023-07-17 ENCOUNTER — Encounter: Payer: Self-pay | Admitting: Family Medicine

## 2023-07-25 ENCOUNTER — Ambulatory Visit: Payer: Medicare HMO | Admitting: Family Medicine

## 2023-07-26 ENCOUNTER — Encounter: Payer: Self-pay | Admitting: Family Medicine

## 2023-07-26 MED ORDER — OXYCODONE HCL 15 MG PO TABS: 15.0000 mg | ORAL_TABLET | Freq: Four times a day (QID) | ORAL | 0 refills | Status: DC

## 2023-07-31 ENCOUNTER — Encounter: Payer: Self-pay | Admitting: Psychiatry

## 2023-07-31 ENCOUNTER — Encounter: Payer: Self-pay | Admitting: Family Medicine

## 2023-07-31 ENCOUNTER — Ambulatory Visit (INDEPENDENT_AMBULATORY_CARE_PROVIDER_SITE_OTHER): Payer: Medicare HMO | Admitting: Psychiatry

## 2023-07-31 DIAGNOSIS — F422 Mixed obsessional thoughts and acts: Secondary | ICD-10-CM | POA: Diagnosis not present

## 2023-07-31 DIAGNOSIS — F4310 Post-traumatic stress disorder, unspecified: Secondary | ICD-10-CM

## 2023-07-31 DIAGNOSIS — G894 Chronic pain syndrome: Secondary | ICD-10-CM | POA: Diagnosis not present

## 2023-07-31 DIAGNOSIS — F902 Attention-deficit hyperactivity disorder, combined type: Secondary | ICD-10-CM | POA: Diagnosis not present

## 2023-07-31 DIAGNOSIS — F39 Unspecified mood [affective] disorder: Secondary | ICD-10-CM

## 2023-07-31 DIAGNOSIS — F411 Generalized anxiety disorder: Secondary | ICD-10-CM | POA: Diagnosis not present

## 2023-07-31 DIAGNOSIS — F5105 Insomnia due to other mental disorder: Secondary | ICD-10-CM | POA: Diagnosis not present

## 2023-07-31 MED ORDER — AMPHETAMINE-DEXTROAMPHETAMINE 30 MG PO TABS: 15.0000 mg | ORAL_TABLET | Freq: Two times a day (BID) | ORAL | 0 refills | Status: DC

## 2023-07-31 MED ORDER — QUETIAPINE FUMARATE ER 400 MG PO TB24: 400.0000 mg | ORAL_TABLET | Freq: Two times a day (BID) | ORAL | 1 refills | Status: DC

## 2023-07-31 MED ORDER — PREGABALIN 150 MG PO CAPS: 150.0000 mg | ORAL_CAPSULE | Freq: Four times a day (QID) | ORAL | 3 refills | Status: DC
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
## 2023-08-01 MED ORDER — PENICILLIN V POTASSIUM 500 MG PO TABS: 500.0000 mg | ORAL_TABLET | Freq: Three times a day (TID) | ORAL | 0 refills | Status: AC

## 2023-08-15 ENCOUNTER — Other Ambulatory Visit: Payer: Self-pay | Admitting: General Surgery

## 2023-08-15 DIAGNOSIS — Z8719 Personal history of other diseases of the digestive system: Secondary | ICD-10-CM

## 2023-09-11 ENCOUNTER — Ambulatory Visit: Payer: Self-pay

## 2023-09-11 NOTE — Telephone Encounter (Signed)
  Chief Complaint: lower back pain and right hand swelling and numbness to fingertips Symptoms: severe lower back pain radiating down right leg Frequency: last week  Pertinent Negatives: Patient denies incontinence Disposition: [] ED /[x] Urgent Care (no appt availability in office) / [] Appointment(In office/virtual)/ []  [facility]/ [] Home Care/ [] Refused Recommended Disposition /[] [facility]/ []  Follow-up with PCP Additional Notes: condition warrants UC - did look for appts but end of day and none available Reason for Disposition  [1] SEVERE back pain (e.g., excruciating, unable to do any normal activities) AND [2] not improved 2 hours after pain medicine  Answer Assessment - Initial Assessment Questions 1. ONSET: "When did the pain begin?"      Last week  2. LOCATION: "Where does it hurt?" (upper, mid or lower back)     Lower back  3. SEVERITY: "How bad is the pain?"  (e.g., Scale 1-10; mild, moderate, or severe)   - MILD (1-3): Doesn't interfere with normal activities.    - MODERATE (4-7): Interferes with normal activities or awakens from sleep.    - SEVERE (8-10): Excruciating pain, unable to do any normal activities.      Back pain: 8/10 with spasms 4. PATTERN: "Is the pain constant?" (e.g., yes, no; constant, intermittent)      *No Answer* 5. RADIATION: "Does the pain shoot into your legs or somewhere else?"     Toward the right down right leg  6. CAUSE:  "What do you think is causing the back pain?"      overexertion 7. BACK OVERUSE:  "Any recent lifting of heavy objects, strenuous work or exercise?"     Strenuous  8. MEDICINES: "What have you taken so far for the pain?" (e.g., nothing, acetaminophen, NSAIDS)     *No Answer* 9. NEUROLOGIC SYMPTOMS: "Do you have any weakness, numbness, or problems with bowel/bladder control?"     Fingertips right numbness right is swollen sausage  10. OTHER SYMPTOMS: "Do you have any other symptoms?" (e.g., fever, abdomen  pain, burning with urination, blood in urine)       *No Answer*  Protocols used: Back Pain-A-AH

## 2023-09-12 ENCOUNTER — Ambulatory Visit: Payer: Self-pay

## 2023-09-12 NOTE — Telephone Encounter (Signed)
 Copied from CRM [id number]. Topic: Clinical - Red Word Triage >> Sep 12, 2023  8:12 AM Toni Collins wrote: Red Word that prompted transfer to Nurse Triage: Patient stating she called in yesterday and spoke with Nurse Abraham Hoffmann, patient stated she is still having back pain and numbness in her hand   Chief Complaint: Back Pain  Symptoms: Lower Back Pain, Numbness  Frequency: Acute  Pertinent Negatives: Patient denies incontinence, headaches,   Disposition: [] ED /[x] Urgent Care (no appt availability in office) / [] Appointment(In office/virtual)/ []  [facility]/ [] Home Care/ [x] Refused Recommended Disposition /[] [facility]/ []  Follow-up with PCP  Additional Notes: MS is being triaged for back pain and numbness in her hand. The pain does shoot into her legs and the pain also. Recommended the patient see a healthcare provider in the next four hours based on presentation. Patient verbalized understanding and opted to wait until appointment on Tuesday.   Reason for Disposition  [1] Pain radiates into the thigh or further down the leg AND [2] both legs  Answer Assessment - Initial Assessment Questions 1. ONSET: "When did the pain begin?"      A couple of days  2. LOCATION: "Where does it hurt?" (upper, mid or lower back)     Lower Back Pain  3. SEVERITY: "How bad is the pain?"  (e.g., Scale 1-10; mild, moderate, or severe)   - MILD (1-3): Doesn't interfere with normal activities.    - MODERATE (4-7): Interferes with normal activities or awakens from sleep.    - SEVERE (8-10): Excruciating pain, unable to do any normal activities.      8  4. PATTERN: "Is the pain constant?" (e.g., yes, no; constant, intermittent)      Constant  5. RADIATION: "Does the pain shoot into your legs or somewhere else?"     Shoots into legs  6. CAUSE:  "What do you think is causing the back pain?"      Unsure  7. BACK OVERUSE:  "Any recent lifting of heavy objects, strenuous work or exercise?"      No  8. MEDICINES: "What have you taken so far for the pain?" (e.g., nothing, acetaminophen, NSAIDS)     Ibruprofen-Some Relief with Swelling  9. NEUROLOGIC SYMPTOMS: "Do you have any weakness, numbness, or problems with bowel/bladder control?"     Numbness and Weakness in the hand, Right  10. OTHER SYMPTOMS: "Do you have any other symptoms?" (e.g., fever, abdomen pain, burning with urination, blood in urine)        No  11. PREGNANCY: "Is there any chance you are pregnant?" "When was your last menstrual period?"       No and No  Protocols used: Back Pain-A-AH

## 2023-09-16 ENCOUNTER — Ambulatory Visit: Admitting: Family Medicine

## 2023-09-16 NOTE — Telephone Encounter (Signed)
 Noted.  Does need to be seen for further evaluation.  Marquetta Sit MD [facility] at [facility]

## 2023-09-17 ENCOUNTER — Ambulatory Visit: Admitting: Family Medicine

## 2023-09-20 ENCOUNTER — Ambulatory Visit (INDEPENDENT_AMBULATORY_CARE_PROVIDER_SITE_OTHER): Admitting: Family Medicine

## 2023-09-20 VITALS — BP 114/80 | HR 85 | Temp 97.4°F | Wt 156.7 lb

## 2023-09-20 DIAGNOSIS — R202 Paresthesia of skin: Secondary | ICD-10-CM

## 2023-09-20 DIAGNOSIS — G894 Chronic pain syndrome: Secondary | ICD-10-CM | POA: Diagnosis not present

## 2023-09-20 NOTE — Progress Notes (Signed)
 Established Patient Office Visit  Subjective   Patient ID: Toni Collins, female    DOB: 01-11-1966  Age: 58 y.o. MRN: [id number]  Chief Complaint  Patient presents with   Back Pain    Patient complains of back pain, x2 weeks    Shoulder Pain    Patient complains of right shoulder pain, x2 weeks    Numbness    Patient complains of finger numbness, x2 weeks     HPI   Toni Collins is seen with back pain and some right upper extremity numbness past couple weeks.  She did significant amount of cleaning work and afterwards noticed some increased lower lumbar back pain.  Had some brief radiculitis type symptoms on the left but not consistently since then.  Her main symptoms have been 2 weeks of some paresthesias involving the 1st through 4th digits of the right hand.  She is right-hand dominant.  Has had some pain in the forearm as well.  Only fleeting right trapezius pain.  Denies any weakness.  Denies any history of carpal tunnel syndrome.  She has chronic pain and is on chronic oxycodone  15 mg 4 times daily.  She has had some recent intentional weight loss.    Past Medical History:  Diagnosis Date   ADHD    Anemia    Anxiety    Arthritis    Colon polyps    DEPRESSION 09/02/2008   Fibromyalgia    GERD (gastroesophageal reflux disease)    GRIEF REACTION 09/30/2009   INSOMNIA, CHRONIC 09/02/2008   PREDIABETES 03/10/2009   PTSD (post-traumatic stress disorder)    Past Surgical History:  Procedure Laterality Date   ABDOMINAL HYSTERECTOMY  05/28/2000   TAH   COLONOSCOPY     DIAGNOSTIC LAPAROSCOPY  1993, 1994, 1998, 2000   x4   TMJ ARTHROPLASTY     x2   UPPER GASTROINTESTINAL ENDOSCOPY     UPPER GI ENDOSCOPY N/A 02/02/2022   Procedure: UPPER GI ENDOSCOPY;  Surgeon: Aldean Hummingbird, MD;  Location: [facility];  Service: General;  Laterality: N/A;   XI ROBOTIC ASSISTED HIATAL HERNIA REPAIR N/A 02/02/2022   Procedure: XI ROBOTIC ASSISTED HIATAL HERNIA REPAIR WITH PARTAL WRAP, INSERTION  OF CHEST TUBE;  Surgeon: Aldean Hummingbird, MD;  Location: [facility];  Service: General;  Laterality: N/A;    reports that she has never smoked. She has never used smokeless tobacco. She reports that she does not drink alcohol and does not use drugs. family history includes Arthritis in her mother; Colon polyps in her father and sister; Diabetes in her maternal aunt and maternal grandmother; Lung disease in her father. No Known Allergies  Review of Systems  Constitutional:  Negative for chills and fever.  Cardiovascular:  Negative for chest pain.  Musculoskeletal:  Positive for back pain.  Neurological:  Positive for tingling and sensory change. Negative for speech change and focal weakness.      Objective:     BP 114/80 (BP Location: Left Arm, Patient Position: Sitting, Cuff Size: Normal)   Pulse 85   Temp (!) 97.4 F (36.3 C) (Oral)   Wt 156 lb 11.2 oz (71.1 kg)   SpO2 97%   BMI 28.66 kg/m  BP Readings from Last 3 Encounters:  09/20/23 114/80  06/04/23 122/80  03/04/23 110/88   Wt Readings from Last 3 Encounters:  09/20/23 156 lb 11.2 oz (71.1 kg)  06/04/23 163 lb 9.6 oz (74.2 kg)  03/04/23 172 lb 1.6 oz (78.1 kg)  Physical Exam Vitals reviewed.  Constitutional:      General: She is not in acute distress.    Appearance: She is not ill-appearing.  Cardiovascular:     Rate and Rhythm: Normal rate and regular rhythm.  Pulmonary:     Effort: Pulmonary effort is normal.     Breath sounds: Normal breath sounds.  Musculoskeletal:     Comments: No obvious muscle atrophy upper extremities.  Full range of motion right shoulder.  She has fairly good range of motion with lateral bending and rotation right and left.    Neurological:     Mental Status: She is alert.     Comments: Slightly diminished right grip strength compared to the left.  2+ reflexes throughout upper extremities bilaterally She has slightly decreased subjective sensation to monofilament volar surface of the  right hand 1st through 4th digits versus dorsally.      No results found for any visits on 09/20/23.    The 10-year ASCVD risk score (Arnett DK, et al., 2019) is: 1.5%    Assessment & Plan:   #1 paresthesias right hand 1st through 4th digits.  Suspect she likely has some carpal tunnel syndrome.  We have recommended night Velcro wrist splint and daytime use as much as possible for the next few weeks.  Be in touch if she is not seeing any improvement in terms of the paresthesias.  May need to consider nerve conduction velocities to further assess if not improving  #2 chronic back pain.  Patient on chronic opioids.  Pain stable overall.  Not due for refill now.  She was initially scheduled for May 5 chronic pain management follow-up in she will cancel that appointment since we are seeing her today.   Glean Lamy, MD

## 2023-09-20 NOTE — Patient Instructions (Signed)
 Used velcro wrist splint for right wrist and sleep in this at night  and during day as much as possible  Let me know in 2-3 weeks if not improving.

## 2023-09-25 ENCOUNTER — Encounter: Payer: Self-pay | Admitting: Family Medicine

## 2023-09-25 ENCOUNTER — Telehealth: Payer: Self-pay

## 2023-09-25 ENCOUNTER — Other Ambulatory Visit: Payer: Self-pay | Admitting: Family Medicine

## 2023-09-25 DIAGNOSIS — G894 Chronic pain syndrome: Secondary | ICD-10-CM

## 2023-09-25 MED ORDER — OXYCODONE HCL 15 MG PO TABS
15.0000 mg | ORAL_TABLET | Freq: Four times a day (QID) | ORAL | 0 refills | Status: DC
Start: 2023-09-25 — End: 2023-09-27

## 2023-09-25 MED ORDER — OXYCODONE HCL 15 MG PO TABS: 15.0000 mg | ORAL_TABLET | Freq: Four times a day (QID) | ORAL | 0 refills | Status: DC | PRN

## 2023-09-25 NOTE — Telephone Encounter (Signed)
 Copied from CRM [id number]. Topic: Clinical - Prescription Issue >> Sep 25, 2023  8:40 AM [name] wrote: Reason for CRM: Porfirio Bristol, the pharmacist from [facility], called this morning to inform the patient's PCP that the prescription refill for Oxycodone  could not be filled due to the absence of a diagnosis code on the prescription. He stated that [facility] has updated their policy requiring a DX code for all controlled substance prescriptions. The patient is requesting for the prescription to be filled today.  Callback Number: [phone]

## 2023-09-25 NOTE — Telephone Encounter (Signed)
 Toni Collins with [facility] informed of dx code

## 2023-09-27 ENCOUNTER — Telehealth: Payer: Self-pay | Admitting: Psychiatry

## 2023-09-27 ENCOUNTER — Telehealth: Payer: Self-pay

## 2023-09-27 MED ORDER — OXYCODONE HCL 15 MG PO TABS: 15.0000 mg | ORAL_TABLET | Freq: Four times a day (QID) | ORAL | 0 refills | Status: DC

## 2023-09-27 MED ORDER — OXYCODONE HCL 15 MG PO TABS: 15.0000 mg | ORAL_TABLET | Freq: Four times a day (QID) | ORAL | 0 refills | Status: DC | PRN

## 2023-09-27 NOTE — Telephone Encounter (Signed)
 John, pharmacist at [facility] [phone]) is questioning patient's Lyrica  Rx. There is notation on Rx that it is used for chronic pain as well as TR anxiety.  John said she is also getting oxycodone  from PCP. He is reporting new regulatory system and needing to clarify need for both Lyrica  and oxycodone  for pain.    09/12/2023 07/31/2023 1  Dextroamp-Amphetamin 30 Mg Tab 30.00 30 Ca Cot [id number] Nor (2879) 0/0  Medicare [place] 09/03/2023 07/31/2023 1  Pregabalin  150 Mg Capsule 120.00 30 Ca Cot [id number] Nor (2879) 2/3 4.02 LME Private Pay [place] 08/26/2023 07/26/2023 1  Oxycodone  Hcl (Ir) 15 Mg Tab 120.00

## 2023-09-27 NOTE — Telephone Encounter (Signed)
 I spoke with Toni Collins at [facility] and informed him of the message below

## 2023-09-27 NOTE — Telephone Encounter (Signed)
 John at [facility] LVM @ 2:56p asking for a return call about the controlled medicine prescribed by Dr Toi Foster for this pt.  Next appt 7/9

## 2023-09-27 NOTE — Telephone Encounter (Signed)
Nothing further is needed at this time.  

## 2023-09-27 NOTE — Telephone Encounter (Signed)
 Copied from CRM [id number]. Topic: Clinical - Prescription Issue >> Sep 27, 2023  2:09 PM [place] J wrote: Reason for CRM: Autry Legions calling in from [facility] in regards to the patient's pregabalin  (LYRICA ) 150 MG and oxyCODONE  (ROXICODONE ) 15 MG. He stated that the patient is actively receiving pain medications at a high dose from two providers at the same time (Dr. Darren Em and Dr. Toi Foster) and wants to know if Dr. Darren Em is aware of what's going on. He is also wondering why the patient is needing to get two different kinds of pain management medications from different providers.   Please call pharmacist back for clarification. [phone]

## 2023-09-30 ENCOUNTER — Ambulatory Visit: Admitting: Family Medicine

## 2023-10-15 ENCOUNTER — Other Ambulatory Visit

## 2023-10-23 ENCOUNTER — Encounter: Payer: Self-pay | Admitting: Family Medicine

## 2023-10-24 MED ORDER — OXYCODONE HCL 15 MG PO TABS: 15.0000 mg | ORAL_TABLET | Freq: Four times a day (QID) | ORAL | 0 refills | Status: DC

## 2023-10-24 MED ORDER — OXYCODONE HCL 15 MG PO TABS: 15.0000 mg | ORAL_TABLET | Freq: Four times a day (QID) | ORAL | 0 refills | Status: DC | PRN

## 2023-10-24 NOTE — Telephone Encounter (Signed)
 Remaining two prescriptions have been sent in with Dx code  Marquetta Sit MD [facility]

## 2023-10-25 ENCOUNTER — Other Ambulatory Visit: Payer: Self-pay

## 2023-10-25 ENCOUNTER — Telehealth: Payer: Self-pay | Admitting: Psychiatry

## 2023-10-25 DIAGNOSIS — F902 Attention-deficit hyperactivity disorder, combined type: Secondary | ICD-10-CM

## 2023-10-25 MED ORDER — AMPHETAMINE-DEXTROAMPHETAMINE 30 MG PO TABS
15.0000 mg | ORAL_TABLET | Freq: Two times a day (BID) | ORAL | 0 refills | Status: DC
Start: 2023-10-25 — End: 2023-11-21

## 2023-10-25 MED ORDER — AMPHETAMINE-DEXTROAMPHETAMINE 30 MG PO TABS: 15.0000 mg | ORAL_TABLET | Freq: Two times a day (BID) | ORAL | 0 refills | Status: DC

## 2023-10-25 NOTE — Telephone Encounter (Signed)
 Pt Lvm 5/29 @ 8:54p requesting refill of Adderall to   [id number] [place], [place] - [address], [place] [address] Phone: [phone]  Fax: [phone]   She said she ran out of medicine on 5/29. No upcoming appt scheduled.

## 2023-10-25 NOTE — Telephone Encounter (Signed)
 Pended 2 RF of [facility]derall to [facility]

## 2023-10-30 ENCOUNTER — Telehealth: Payer: Self-pay | Admitting: Psychiatry

## 2023-10-30 NOTE — Telephone Encounter (Signed)
 Patient called in regarding Lyrica  prescription states that she takes 4 150mg  pills a day and she would like to reduce the first pill of the day to 75mg  as it makes her really sleepy. The rest are fine and don't make her sleepy. Pls rtc [phone] Appt 7/9

## 2023-10-31 ENCOUNTER — Other Ambulatory Visit: Payer: Self-pay

## 2023-10-31 DIAGNOSIS — G894 Chronic pain syndrome: Secondary | ICD-10-CM

## 2023-10-31 DIAGNOSIS — F4310 Post-traumatic stress disorder, unspecified: Secondary | ICD-10-CM

## 2023-10-31 DIAGNOSIS — F411 Generalized anxiety disorder: Secondary | ICD-10-CM

## 2023-10-31 MED ORDER — PREGABALIN 75 MG PO CAPS: 75.0000 mg | ORAL_CAPSULE | ORAL | 0 refills | Status: DC

## 2023-10-31 MED ORDER — PREGABALIN 150 MG PO CAPS: 150.0000 mg | ORAL_CAPSULE | Freq: Three times a day (TID) | ORAL | 0 refills | Status: DC

## 2023-10-31 NOTE — Telephone Encounter (Signed)
 Pended Lyrica  for 75 and her usual 150 mg dosing for review.

## 2023-11-21 ENCOUNTER — Ambulatory Visit (INDEPENDENT_AMBULATORY_CARE_PROVIDER_SITE_OTHER): Admitting: Psychiatry

## 2023-11-21 ENCOUNTER — Encounter: Payer: Self-pay | Admitting: Psychiatry

## 2023-11-21 DIAGNOSIS — F39 Unspecified mood [affective] disorder: Secondary | ICD-10-CM | POA: Diagnosis not present

## 2023-11-21 DIAGNOSIS — F902 Attention-deficit hyperactivity disorder, combined type: Secondary | ICD-10-CM

## 2023-11-21 DIAGNOSIS — F4310 Post-traumatic stress disorder, unspecified: Secondary | ICD-10-CM

## 2023-11-21 DIAGNOSIS — F5105 Insomnia due to other mental disorder: Secondary | ICD-10-CM

## 2023-11-21 DIAGNOSIS — G894 Chronic pain syndrome: Secondary | ICD-10-CM | POA: Diagnosis not present

## 2023-11-21 DIAGNOSIS — F422 Mixed obsessional thoughts and acts: Secondary | ICD-10-CM | POA: Diagnosis not present

## 2023-11-21 DIAGNOSIS — F411 Generalized anxiety disorder: Secondary | ICD-10-CM

## 2023-11-21 MED ORDER — QUETIAPINE FUMARATE ER 400 MG PO TB24: 400.0000 mg | ORAL_TABLET | Freq: Two times a day (BID) | ORAL | 1 refills | Status: DC

## 2023-11-21 MED ORDER — AMPHETAMINE-DEXTROAMPHETAMINE 30 MG PO TABS: 15.0000 mg | ORAL_TABLET | Freq: Two times a day (BID) | ORAL | 0 refills | Status: DC

## 2023-11-21 MED ORDER — PREGABALIN 150 MG PO CAPS: 150.0000 mg | ORAL_CAPSULE | Freq: Four times a day (QID) | ORAL | 3 refills | Status: DC
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
## 2023-11-22 ENCOUNTER — Ambulatory Visit (INDEPENDENT_AMBULATORY_CARE_PROVIDER_SITE_OTHER): Admitting: Family Medicine

## 2023-11-22 ENCOUNTER — Encounter: Payer: Self-pay | Admitting: Family Medicine

## 2023-11-22 VITALS — BP 120/78 | HR 91 | Temp 97.8°F | Wt 143.7 lb

## 2023-11-22 DIAGNOSIS — G894 Chronic pain syndrome: Secondary | ICD-10-CM

## 2023-11-22 MED ORDER — OXYCODONE HCL 15 MG PO TABS: 15.0000 mg | ORAL_TABLET | Freq: Four times a day (QID) | ORAL | 0 refills | Status: DC

## 2023-11-22 MED ORDER — OXYCODONE HCL 15 MG PO TABS: 15.0000 mg | ORAL_TABLET | Freq: Four times a day (QID) | ORAL | 0 refills | Status: DC | PRN

## 2023-11-22 NOTE — Progress Notes (Signed)
 Established Patient Office Visit  Subjective   Patient ID: Toni Collins, female    DOB: [birth year]  Age: 58 y.o. MRN: [id number]  Chief Complaint  Patient presents with   Medical Management of Chronic Issues    HPI   Toni Collins is seen for chronic follow-up.  She has chronic pain syndrome with severe fibromyalgia and history of chronic back pain.  She has been on opioids for several years.  She has done a tremendous job this past year with improving her health with walking currently 5 miles per day.  Usually divides this with 2-1/2 mile walks twice per day.  She has lost 35 pounds between this and some dietary changes.  Feels better overall.  More stamina.  She is hoping by this fall she can start possibly tapering back her pain medication some.  She has been on this dose of oxycodone  for quite some time.  She currently takes 15 mg 4 times daily.  Has had some breakthrough pain even with this dosage.  Followed by psychiatry.  She has history of ADD and resistant depression.  Currently on high-dose Seroquel  and also takes Lyrica .  Indication for chronic opioid: Chronic pain syndrome Medication and dose: Oxycodone  15 mg every 6 hours # pills per month: 120 Last UDS date: 2024 Opioid Treatment Agreement signed (Y/N): y Opioid Treatment Agreement last reviewed with patient:  yes  [facility] reviewed this encounter (include red flags): none   Past Medical History:  Diagnosis Date   ADHD    Anemia    Anxiety    Arthritis    Colon polyps    DEPRESSION 09/02/2008   Fibromyalgia    GERD (gastroesophageal reflux disease)    GRIEF REACTION 09/30/2009   INSOMNIA, CHRONIC 09/02/2008   PREDIABETES 03/10/2009   PTSD (post-traumatic stress disorder)    Past Surgical History:  Procedure Laterality Date   ABDOMINAL HYSTERECTOMY  05/28/2000   TAH   COLONOSCOPY     DIAGNOSTIC LAPAROSCOPY  1993, 1994, 1998, 2000   x4   TMJ ARTHROPLASTY     x2   UPPER GASTROINTESTINAL ENDOSCOPY      UPPER GI ENDOSCOPY N/A 02/02/2022   Procedure: UPPER GI ENDOSCOPY;  Surgeon: Tanda Locus, MD;  Location: [facility];  Service: General;  Laterality: N/A;   XI ROBOTIC ASSISTED HIATAL HERNIA REPAIR N/A 02/02/2022   Procedure: XI ROBOTIC ASSISTED HIATAL HERNIA REPAIR WITH PARTAL WRAP, INSERTION OF CHEST TUBE;  Surgeon: Tanda Locus, MD;  Location: [facility];  Service: General;  Laterality: N/A;    reports that she has never smoked. She has never used smokeless tobacco. She reports that she does not drink alcohol and does not use drugs. family history includes Arthritis in her mother; Colon polyps in her father and sister; Diabetes in her maternal aunt and maternal grandmother; Lung disease in her father. No Known Allergies   Review of Systems  Respiratory:  Negative for shortness of breath.   Cardiovascular:  Negative for chest pain.  Musculoskeletal:  Positive for back pain and myalgias.      Objective:     BP 120/78 (BP Location: Left Arm, Cuff Size: Normal)   Pulse 91   Temp 97.8 F (36.6 C) (Oral)   Wt 143 lb 11.2 oz (65.2 kg)   SpO2 95%   BMI 26.28 kg/m  BP Readings from Last 3 Encounters:  11/22/23 120/78  09/20/23 114/80  06/04/23 122/80   Wt Readings from Last 3 Encounters:  11/22/23 143 lb  11.2 oz (65.2 kg)  09/20/23 156 lb 11.2 oz (71.1 kg)  06/04/23 163 lb 9.6 oz (74.2 kg)      Physical Exam Vitals reviewed.  Constitutional:      General: She is not in acute distress.    Appearance: She is not ill-appearing.   Cardiovascular:     Rate and Rhythm: Normal rate and regular rhythm.  Pulmonary:     Effort: Pulmonary effort is normal.     Breath sounds: Normal breath sounds. No wheezing or rales.   Musculoskeletal:     Right lower leg: No edema.     Left lower leg: No edema.   Neurological:     Mental Status: She is alert.      No results found for any visits on 11/22/23.    The 10-year ASCVD risk score (Arnett DK, et al., 2019) is: 1.7%    Assessment  & Plan:   Problem List Items Addressed This Visit       Unprioritized   CHRONIC PAIN SYNDROME - Primary   Relevant Medications   oxyCODONE  (ROXICODONE ) 15 MG immediate release tablet   oxyCODONE  (ROXICODONE ) 15 MG immediate release tablet   oxyCODONE  (ROXICODONE ) 15 MG immediate release tablet   Other Relevant Orders   DRUG MONITORING, PANEL 8 WITH CONFIRMATION, URINE  History of chronic fibromyalgia and back pain.  She has done tremendous job with lifestyle changes with walking and intentional weight loss through diet and exercise.  Has lost over 35 pounds.  Feels better overall.  Continue positive lifestyle changes.  Urine drug screen sent.  Hopefully her weight loss will help her manage her chronic pain more effectively.  Would like to discuss possible gradual tapering back of opioids in the near future and she is interested in this.  Suggested complete physical in the fall get follow-up labs then including A1c  Return in about 3 months (around 02/22/2024).    Wolm Scarlet, MD

## 2023-11-22 NOTE — Addendum Note (Signed)
 Addended by: MICHEAL WOLM ORN on: 11/22/2023 04:32 PM   Modules accepted: Orders

## 2023-11-24 LAB — DRUG MONITORING, PANEL 8 WITH CONFIRMATION, URINE
6 Acetylmorphine: NEGATIVE ng/mL (ref <10)
Alcohol Metabolites: POSITIVE ng/mL — AB (ref <500)
Amphetamine: 1037 ng/mL — ABNORMAL HIGH (ref <250)
Amphetamines: POSITIVE ng/mL — AB (ref <500)
Benzodiazepines: NEGATIVE ng/mL (ref <100)
Buprenorphine, Urine: NEGATIVE ng/mL (ref <5)
Cocaine Metabolite: NEGATIVE ng/mL (ref <150)
Codeine: NEGATIVE ng/mL (ref <50)
Creatinine: 40.9 mg/dL (ref >=20.0)
Ethyl Glucuronide (ETG): 17967 ng/mL — ABNORMAL HIGH (ref <500)
Ethyl Sulfate (ETS): 4084 ng/mL — ABNORMAL HIGH (ref <100)
Hydrocodone: NEGATIVE ng/mL (ref <50)
Hydromorphone: NEGATIVE ng/mL (ref <50)
MDMA: NEGATIVE ng/mL (ref <500)
Marijuana Metabolite: NEGATIVE ng/mL (ref <20)
Methamphetamine: NEGATIVE ng/mL (ref <250)
Morphine: NEGATIVE ng/mL (ref <50)
Norhydrocodone: NEGATIVE ng/mL (ref <50)
Noroxycodone: 3490 ng/mL — ABNORMAL HIGH (ref <50)
Opiates: NEGATIVE ng/mL (ref <100)
Oxidant: NEGATIVE ug/mL (ref <200)
Oxycodone: 1215 ng/mL — ABNORMAL HIGH (ref <50)
Oxycodone: POSITIVE ng/mL — AB (ref <100)
Oxymorphone: 434 ng/mL — ABNORMAL HIGH (ref <50)
pH: 6.2 (ref 4.5–9.0)

## 2023-11-24 LAB — DM TEMPLATE

## 2023-11-25 ENCOUNTER — Ambulatory Visit: Payer: Self-pay | Admitting: Family Medicine

## 2023-11-25 ENCOUNTER — Ambulatory Visit: Admitting: Family Medicine

## 2023-11-28 ENCOUNTER — Ambulatory Visit: Admitting: Psychiatry

## 2023-12-04 ENCOUNTER — Ambulatory Visit (INDEPENDENT_AMBULATORY_CARE_PROVIDER_SITE_OTHER): Admitting: Psychiatry

## 2023-12-31 ENCOUNTER — Encounter: Payer: Self-pay | Admitting: Psychiatry

## 2023-12-31 ENCOUNTER — Ambulatory Visit (INDEPENDENT_AMBULATORY_CARE_PROVIDER_SITE_OTHER): Admitting: Psychiatry

## 2023-12-31 DIAGNOSIS — F4310 Post-traumatic stress disorder, unspecified: Secondary | ICD-10-CM

## 2023-12-31 DIAGNOSIS — F5105 Insomnia due to other mental disorder: Secondary | ICD-10-CM

## 2023-12-31 DIAGNOSIS — F39 Unspecified mood [affective] disorder: Secondary | ICD-10-CM | POA: Diagnosis not present

## 2023-12-31 DIAGNOSIS — F422 Mixed obsessional thoughts and acts: Secondary | ICD-10-CM

## 2023-12-31 DIAGNOSIS — G894 Chronic pain syndrome: Secondary | ICD-10-CM | POA: Diagnosis not present

## 2023-12-31 DIAGNOSIS — F902 Attention-deficit hyperactivity disorder, combined type: Secondary | ICD-10-CM | POA: Diagnosis not present

## 2023-12-31 DIAGNOSIS — F411 Generalized anxiety disorder: Secondary | ICD-10-CM | POA: Diagnosis not present

## 2023-12-31 MED ORDER — AMPHETAMINE-DEXTROAMPHETAMINE 30 MG PO TABS: 15.0000 mg | ORAL_TABLET | Freq: Two times a day (BID) | ORAL | 0 refills | Status: DC

## 2023-12-31 MED ORDER — PROPRANOLOL HCL 20 MG PO TABS: ORAL_TABLET | ORAL | 1 refills | Status: DC
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
## 2024-01-21 ENCOUNTER — Encounter: Payer: Self-pay | Admitting: Family Medicine

## 2024-01-21 MED ORDER — ONDANSETRON 8 MG PO TBDP: ORAL_TABLET | ORAL | 0 refills | Status: AC

## 2024-01-22 ENCOUNTER — Other Ambulatory Visit: Payer: Self-pay | Admitting: Psychiatry

## 2024-01-22 DIAGNOSIS — F4310 Post-traumatic stress disorder, unspecified: Secondary | ICD-10-CM

## 2024-01-22 DIAGNOSIS — F411 Generalized anxiety disorder: Secondary | ICD-10-CM

## 2024-02-20 ENCOUNTER — Ambulatory Visit: Admitting: Psychology

## 2024-02-20 ENCOUNTER — Ambulatory Visit (INDEPENDENT_AMBULATORY_CARE_PROVIDER_SITE_OTHER): Admitting: Psychiatry

## 2024-02-20 ENCOUNTER — Encounter: Payer: Self-pay | Admitting: Psychiatry

## 2024-02-20 DIAGNOSIS — F4310 Post-traumatic stress disorder, unspecified: Secondary | ICD-10-CM

## 2024-02-20 DIAGNOSIS — F411 Generalized anxiety disorder: Secondary | ICD-10-CM

## 2024-02-20 DIAGNOSIS — F5105 Insomnia due to other mental disorder: Secondary | ICD-10-CM

## 2024-02-20 DIAGNOSIS — F39 Unspecified mood [affective] disorder: Secondary | ICD-10-CM | POA: Diagnosis not present

## 2024-02-20 DIAGNOSIS — G894 Chronic pain syndrome: Secondary | ICD-10-CM

## 2024-02-20 DIAGNOSIS — F422 Mixed obsessional thoughts and acts: Secondary | ICD-10-CM | POA: Diagnosis not present

## 2024-02-20 DIAGNOSIS — F902 Attention-deficit hyperactivity disorder, combined type: Secondary | ICD-10-CM

## 2024-02-20 MED ORDER — AMPHETAMINE-DEXTROAMPHETAMINE 30 MG PO TABS: 15.0000 mg | ORAL_TABLET | Freq: Two times a day (BID) | ORAL | 0 refills | Status: DC
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
## 2024-02-20 NOTE — Patient Instructions (Addendum)
 Propranolol  trial 60-80 mg (3-4 tablets) 1-2 times daily for anxiety  If it does not help then stop it and  Then reduce Seroquel  to 1 tablet at night and add Caplyta 1 daily

## 2024-03-05 ENCOUNTER — Ambulatory Visit: Admitting: Psychology

## 2024-03-09 ENCOUNTER — Telehealth: Payer: Self-pay | Admitting: Psychiatry

## 2024-03-09 NOTE — Telephone Encounter (Signed)
 Pt states she took a few extra seroquel  and the pharm said they wont fill it until another 6 days. Pt is requesting we call and ask for early fill.    [facility]

## 2024-03-09 NOTE — Telephone Encounter (Signed)
 Pt reported taking an extra Seroquel  XR 400 mg when she wakes up during the night and can't sleep. She reports being 6 days short of medication before insurance will cover it and is asking us  to authorize an early RF. She has RF available, just not time to fill.   Last seen 9/25, has FU 12/3.  Is taking: Adderall 30 mg Pregabalin  150 mg QID Inderal  20 mg 1-2 tabs TID PRN Seroquel  XR 400 mg BID

## 2024-03-10 ENCOUNTER — Other Ambulatory Visit: Payer: Self-pay | Admitting: Psychiatry

## 2024-03-10 MED ORDER — QUETIAPINE FUMARATE 200 MG PO TABS: 200.0000 mg | ORAL_TABLET | Freq: Every day | ORAL | 0 refills | Status: DC

## 2024-03-10 NOTE — Telephone Encounter (Signed)
 The pharmacy will not be happy about me oking RF of early Seroquel  XR refill.  Instead I will give her a RX for short-acting Seroquel  which is better for sleep anyway.  Take it prn if has EMA instead of doubling up on Seroquel  XR.  She is already on the max dose of the XR. Sent Seroquel  200 mg IR 1 at night.

## 2024-03-10 NOTE — Telephone Encounter (Signed)
 Pt said she was able to get a RF of Seroquel  XR today and does not need the IR. Cautioned her that she was at maximum dose of Seroquel  XR and if she feels she needs more she needs to let us  know on the front end.

## 2024-03-16 ENCOUNTER — Ambulatory Visit: Admitting: Family Medicine

## 2024-03-20 ENCOUNTER — Other Ambulatory Visit: Payer: Self-pay | Admitting: Psychiatry

## 2024-03-20 DIAGNOSIS — G894 Chronic pain syndrome: Secondary | ICD-10-CM

## 2024-03-20 DIAGNOSIS — F411 Generalized anxiety disorder: Secondary | ICD-10-CM

## 2024-03-20 DIAGNOSIS — F4310 Post-traumatic stress disorder, unspecified: Secondary | ICD-10-CM

## 2024-03-23 NOTE — Telephone Encounter (Signed)
 Pt called at 9:05a requesting refill of Lyrica .  She said she thought she had refill left and she doesn't do go when she doesn't have her medicine.  She is out.  Pls [facility] to   [id number] [place] [place] - [address] [place] AT [facility] [address] [place] [address], [place] [address] Phone: [phone]  Fax: [phone]    Next appt 12/3

## 2024-03-23 NOTE — Telephone Encounter (Signed)
 LF 9/30, due 10/28. Patient notified.

## 2024-03-24 ENCOUNTER — Other Ambulatory Visit: Payer: Self-pay

## 2024-04-15 ENCOUNTER — Encounter: Payer: Self-pay | Admitting: Family Medicine

## 2024-04-15 ENCOUNTER — Telehealth: Admitting: Family Medicine

## 2024-04-15 DIAGNOSIS — G894 Chronic pain syndrome: Secondary | ICD-10-CM

## 2024-04-15 DIAGNOSIS — R5383 Other fatigue: Secondary | ICD-10-CM

## 2024-04-15 MED ORDER — OXYCODONE HCL 15 MG PO TABS: 15.0000 mg | ORAL_TABLET | Freq: Four times a day (QID) | ORAL | 0 refills | Status: AC

## 2024-04-15 MED ORDER — OXYCODONE HCL 15 MG PO TABS: 15.0000 mg | ORAL_TABLET | ORAL | 0 refills | Status: AC | PRN

## 2024-04-15 MED ORDER — OXYCODONE HCL 15 MG PO TABS: 15.0000 mg | ORAL_TABLET | Freq: Four times a day (QID) | ORAL | 0 refills | Status: DC | PRN

## 2024-04-15 NOTE — Progress Notes (Unsigned)
 Patient ID: Toni Collins, female   DOB: Feb 18, 1966, 58 y.o.   MRN: [id number]  Virtual Visit via Video Note  I connected with Toni Collins on 04/15/24 at 10:15 AM EST by a video enabled telemedicine application and verified that I am speaking with the correct person using two identifiers.  Location patient: home Location provider:work or home office Persons participating in the virtual visit: patient, provider  I discussed the limitations of evaluation and management by telemedicine and the availability of in person appointments. The patient expressed understanding and agreed to proceed.   HPI: Toni seen for chronic medical follow-up.  She has been having some fatigue recently which she thinks is related to dizziness from recent daughter's wedding.  She had been walking up to 5 miles per day.  Hopes to get back to that soon.  She has chronic pain syndrome and has been on oxycodone  for several years.  She is also followed by psychiatry and treated for recurrent depression currently on high-dose Seroquel .  Also takes Lyrica .  She has history of fibromyalgia and has had some recent flareup with increased myalgias and skin sensitivity.  Indication for chronic opioid: Chronic back pain, chronic pain syndrome Medication and dose: Oxycodone  15 mg every 6 hours # pills per month: 120 Last UDS date: 6/25 Opioid Treatment Agreement signed (Y/N): yes Opioid Treatment Agreement last reviewed with patient:2025   NCCSRS reviewed this encounter (include red flags): Yes    ROS: See pertinent positives and negatives per HPI.  Past Medical History:  Diagnosis Date   ADHD    Anemia    Anxiety    Arthritis    Colon polyps    DEPRESSION 09/02/2008   Fibromyalgia    GERD (gastroesophageal reflux disease)    GRIEF REACTION 09/30/2009   INSOMNIA, CHRONIC 09/02/2008   PREDIABETES 03/10/2009   PTSD (post-traumatic stress disorder)     Past Surgical History:  Procedure Laterality Date    ABDOMINAL HYSTERECTOMY  05/28/2000   TAH   COLONOSCOPY     DIAGNOSTIC LAPAROSCOPY  1993, 1994, 1998, 2000   x4   TMJ ARTHROPLASTY     x2   UPPER GASTROINTESTINAL ENDOSCOPY     UPPER GI ENDOSCOPY N/A 02/02/2022   Procedure: UPPER GI ENDOSCOPY;  Surgeon: Tanda Locus, MD;  Location: [facility];  Service: General;  Laterality: N/A;   XI ROBOTIC ASSISTED HIATAL HERNIA REPAIR N/A 02/02/2022   Procedure: XI ROBOTIC ASSISTED HIATAL HERNIA REPAIR WITH PARTAL WRAP, INSERTION OF CHEST TUBE;  Surgeon: Tanda Locus, MD;  Location: [facility];  Service: General;  Laterality: N/A;    Family History  Problem Relation Age of Onset   Arthritis Mother        rhematiod   Colon polyps Father    Lung disease Father    Colon polyps Sister    Diabetes Maternal Aunt    Diabetes Maternal Grandmother    Depression Neg Hx        family   Colon cancer Neg Hx    Esophageal cancer Neg Hx    Pancreatic cancer Neg Hx    Stomach cancer Neg Hx    Rectal cancer Neg Hx     SOCIAL HX: Non-smoker.  No alcohol use.   Current Outpatient Medications:    amphetamine -dextroamphetamine  (ADDERALL) 30 MG tablet, Take 0.5 tablets by mouth 2 (two) times daily., Disp: 30 tablet, Rfl: 0   amphetamine -dextroamphetamine  (ADDERALL) 30 MG tablet, Take 0.5 tablets by mouth 2 (two) times daily., Disp: 30  tablet, Rfl: 0   naloxone  (NARCAN ) nasal spray 4 mg/0.1 mL, Use one actuation per one nostril once as needed for respiratory depression from pain medication, Disp: 2 each, Rfl: 1   ondansetron  (ZOFRAN -ODT) 8 MG disintegrating tablet, Take 1 every 8 hours as needed for nausea, Disp: 20 tablet, Rfl: 0   oxyCODONE  (ROXICODONE ) 15 MG immediate release tablet, Take 1 tablet (15 mg total) by mouth every 6 (six) hours as needed for pain., Disp: 120 tablet, Rfl: 0   oxyCODONE  (ROXICODONE ) 15 MG immediate release tablet, Take 1 tablet (15 mg total) by mouth every 6 (six) hours., Disp: 120 tablet, Rfl: 0   pregabalin  (LYRICA ) 150 MG capsule, TAKE 1  CAPSULE (150 MG TOTAL) BY MOUTH IN THE MORNING, AT NOON, IN THE EVENING, AND AT BEDTIME., Disp: 120 capsule, Rfl: 1   propranolol  (INDERAL ) 20 MG tablet, 1-2 TABLETS THREE TIMES DAILY FOR ANXIETY AS NEEDED, Disp: 540 tablet, Rfl: 0   QUEtiapine  (SEROQUEL  XR) 400 MG 24 hr tablet, Take 1 tablet (400 mg total) by mouth 2 (two) times daily., Disp: 180 tablet, Rfl: 1  EXAM:  VITALS per patient if applicable:  GENERAL: alert, oriented, appears well and in no acute distress  HEENT: atraumatic, conjunttiva clear, no obvious abnormalities on inspection of external nose and ears  NECK: normal movements of the head and neck  LUNGS: on inspection no signs of respiratory distress, breathing rate appears normal, no obvious gross SOB, gasping or wheezing  CV: no obvious cyanosis  MS: moves all visible extremities without noticeable abnormality  PSYCH/NEURO: pleasant and cooperative, no obvious depression or anxiety, speech and thought processing grossly intact  ASSESSMENT AND PLAN:  Discussed the following assessment and plan:  Chronic pain syndrome.  Overall stable.  Refill oxycodone  for 3 months.  Patient has complained of some recent fatigue issues.  Would recommend setting up physical for 59-month follow-up and needs some follow-up labs at that point.     I discussed the assessment and treatment plan with the patient. The patient was provided an opportunity to ask questions and all were answered. The patient agreed with the plan and demonstrated an understanding of the instructions.   The patient was advised to call back or seek an in-person evaluation if the symptoms worsen or if the condition fails to improve as anticipated.     Wolm Scarlet, MD

## 2024-04-15 NOTE — Telephone Encounter (Signed)
Appt has been changed to virtual 

## 2024-04-17 ENCOUNTER — Ambulatory Visit: Admitting: Family Medicine

## 2024-04-24 ENCOUNTER — Encounter: Payer: Self-pay | Admitting: Family Medicine

## 2024-04-28 ENCOUNTER — Other Ambulatory Visit: Payer: Self-pay | Admitting: Psychiatry

## 2024-04-28 ENCOUNTER — Telehealth: Payer: Self-pay | Admitting: Psychiatry

## 2024-04-28 ENCOUNTER — Other Ambulatory Visit: Payer: Self-pay

## 2024-04-28 DIAGNOSIS — F39 Unspecified mood [affective] disorder: Secondary | ICD-10-CM

## 2024-04-28 DIAGNOSIS — F422 Mixed obsessional thoughts and acts: Secondary | ICD-10-CM

## 2024-04-28 DIAGNOSIS — F411 Generalized anxiety disorder: Secondary | ICD-10-CM

## 2024-04-28 DIAGNOSIS — F4310 Post-traumatic stress disorder, unspecified: Secondary | ICD-10-CM

## 2024-04-28 DIAGNOSIS — F902 Attention-deficit hyperactivity disorder, combined type: Secondary | ICD-10-CM

## 2024-04-28 MED ORDER — AMPHETAMINE-DEXTROAMPHETAMINE 30 MG PO TABS: 15.0000 mg | ORAL_TABLET | Freq: Two times a day (BID) | ORAL | 0 refills | Status: DC

## 2024-04-28 NOTE — Telephone Encounter (Signed)
 Husband called asking for a refill on Toni Collins's adderall 15 mg. [facility] [place]. Next appt 12/29

## 2024-04-28 NOTE — Telephone Encounter (Signed)
Pended Adderall 

## 2024-04-29 ENCOUNTER — Ambulatory Visit (INDEPENDENT_AMBULATORY_CARE_PROVIDER_SITE_OTHER): Admitting: Psychiatry

## 2024-04-29 NOTE — Telephone Encounter (Signed)
 Noted

## 2024-05-04 ENCOUNTER — Encounter: Payer: Self-pay | Admitting: Family Medicine

## 2024-05-04 ENCOUNTER — Ambulatory Visit: Admitting: Family Medicine

## 2024-05-04 VITALS — BP 134/84 | HR 100 | Temp 98.4°F | Wt 149.2 lb

## 2024-05-04 DIAGNOSIS — G2581 Restless legs syndrome: Secondary | ICD-10-CM

## 2024-05-04 DIAGNOSIS — R7303 Prediabetes: Secondary | ICD-10-CM

## 2024-05-04 DIAGNOSIS — E785 Hyperlipidemia, unspecified: Secondary | ICD-10-CM

## 2024-05-04 DIAGNOSIS — M8580 Other specified disorders of bone density and structure, unspecified site: Secondary | ICD-10-CM

## 2024-05-04 DIAGNOSIS — G8929 Other chronic pain: Secondary | ICD-10-CM

## 2024-05-04 DIAGNOSIS — Z8639 Personal history of other endocrine, nutritional and metabolic disease: Secondary | ICD-10-CM | POA: Diagnosis not present

## 2024-05-04 LAB — CBC WITH DIFFERENTIAL/PLATELET
Basophils Absolute: 0 K/uL (ref 0.0–0.1)
Basophils Relative: 1.7 % (ref 0.0–3.0)
Eosinophils Absolute: 0.1 K/uL (ref 0.0–0.7)
Eosinophils Relative: 3.3 % (ref 0.0–5.0)
HCT: 33.1 % — ABNORMAL LOW (ref 36.0–46.0)
Hemoglobin: 10.6 g/dL — ABNORMAL LOW (ref 12.0–15.0)
Lymphocytes Relative: 32.7 % (ref 12.0–46.0)
Lymphs Abs: 0.9 K/uL (ref 0.7–4.0)
MCHC: 32.1 g/dL (ref 30.0–36.0)
MCV: 77.6 fl — ABNORMAL LOW (ref 78.0–100.0)
Monocytes Absolute: 0.2 K/uL (ref 0.1–1.0)
Monocytes Relative: 8.1 % (ref 3.0–12.0)
Neutro Abs: 1.5 K/uL (ref 1.4–7.7)
Neutrophils Relative %: 54.2 % (ref 43.0–77.0)
Platelets: 207 K/uL (ref 150.0–400.0)
RBC: 4.26 Mil/uL (ref 3.87–5.11)
RDW: 16.6 % — ABNORMAL HIGH (ref 11.5–15.5)
WBC: 2.8 K/uL — ABNORMAL LOW (ref 4.0–10.5)

## 2024-05-04 LAB — COMPREHENSIVE METABOLIC PANEL WITH GFR
ALT: 13 U/L (ref 0–35)
AST: 16 U/L (ref 0–37)
Albumin: 3.9 g/dL (ref 3.5–5.2)
Alkaline Phosphatase: 82 U/L (ref 39–117)
BUN: 12 mg/dL (ref 6–23)
CO2: 25 meq/L (ref 19–32)
Calcium: 9 mg/dL (ref 8.4–10.5)
Chloride: 108 meq/L (ref 96–112)
Creatinine, Ser: 0.81 mg/dL (ref 0.40–1.20)
GFR: 81.37 mL/min (ref >60.00)
Glucose, Bld: 100 mg/dL — ABNORMAL HIGH (ref 70–99)
Potassium: 4 meq/L (ref 3.5–5.1)
Sodium: 141 meq/L (ref 135–145)
Total Bilirubin: 0.3 mg/dL (ref 0.2–1.2)
Total Protein: 6.9 g/dL (ref 6.0–8.3)

## 2024-05-04 LAB — LIPID PANEL
Cholesterol: 228 mg/dL — ABNORMAL HIGH (ref 0–200)
HDL: 51.4 mg/dL (ref >39.00)
LDL Cholesterol: 162 mg/dL — ABNORMAL HIGH (ref 0–99)
NonHDL: 176.91
Total CHOL/HDL Ratio: 4
Triglycerides: 77 mg/dL (ref 0.0–149.0)
VLDL: 15.4 mg/dL (ref 0.0–40.0)

## 2024-05-04 LAB — HEMOGLOBIN A1C: Hgb A1c MFr Bld: 5.8 % (ref 4.6–6.5)

## 2024-05-04 MED ORDER — ROPINIROLE HCL 0.25 MG PO TABS: ORAL_TABLET | ORAL | 5 refills | Status: AC

## 2024-05-04 NOTE — Progress Notes (Unsigned)
 Established Patient Office Visit  Subjective   Patient ID: Toni Collins, female    DOB: [birth year]  Age: 58 y.o. MRN: [id number]  No chief complaint on file.   HPI  {History (Optional):23778} Toni Collins was seen for follow-up to discuss several items as follows  She sent a message last week about some recurrent restless leg symptoms.  She is having nightly restlessness of both legs.  Her husband has noticed that her legs seem to be jumping a lot as well.  This is relieved with walking.  She does not drink any late day caffeine.  No alcohol use.  Does have prior history of iron  deficiency anemia with no recent ferritin.  She has had recurrent esophagitis issues and had Nissen fundoplication previously which has helped some.  Still has intermittent dysphagia occasionally.  On chronic PPI.  She does recall previously been treated briefly with Requip  which seemed to help her restless legs.  Symptoms are starting to interfere more with sleep.  Her last hemoglobin on record was 4-24 which was 12.3.  She has past history of prediabetes range blood sugars.  Last A1c 5.8%.  Requesting repeat labs.  She has history of hyperlipidemia but no family history of premature CAD.  She is fasting today.  She has history of chronic back pain.  Has recently had some midthoracic back pain.  Denies any fall or injury.  Husband has noted that she seems to have more of a stooped posture.  In looking back she had DEXA scan many years ago in her early 15s with osteopenia range T-score.  No history of smoking.  No alcohol use.  Chronic PPI use with pantoprazole  40 mg daily  Past Medical History:  Diagnosis Date   ADHD    Anemia    Anxiety    Arthritis    Colon polyps    DEPRESSION 09/02/2008   Fibromyalgia    GERD (gastroesophageal reflux disease)    GRIEF REACTION 09/30/2009   INSOMNIA, CHRONIC 09/02/2008   PREDIABETES 03/10/2009   PTSD (post-traumatic stress disorder)    Past Surgical History:   Procedure Laterality Date   ABDOMINAL HYSTERECTOMY  05/28/2000   TAH   COLONOSCOPY     DIAGNOSTIC LAPAROSCOPY  1993, 1994, 1998, 2000   x4   TMJ ARTHROPLASTY     x2   UPPER GASTROINTESTINAL ENDOSCOPY     UPPER GI ENDOSCOPY N/A 02/02/2022   Procedure: UPPER GI ENDOSCOPY;  Surgeon: Tanda Locus, MD;  Location: [facility];  Service: General;  Laterality: N/A;   XI ROBOTIC ASSISTED HIATAL HERNIA REPAIR N/A 02/02/2022   Procedure: XI ROBOTIC ASSISTED HIATAL HERNIA REPAIR WITH PARTAL WRAP, INSERTION OF CHEST TUBE;  Surgeon: Tanda Locus, MD;  Location: [facility];  Service: General;  Laterality: N/A;    reports that she has never smoked. She has never used smokeless tobacco. She reports that she does not drink alcohol and does not use drugs. family history includes Arthritis in her mother; Colon polyps in her father and sister; Diabetes in her maternal aunt and maternal grandmother; Lung disease in her father. No Known Allergies  Review of Systems  Constitutional:  Negative for malaise/fatigue.  Eyes:  Negative for blurred vision.  Respiratory:  Negative for shortness of breath.   Cardiovascular:  Negative for chest pain.  Musculoskeletal:  Positive for back pain.  Neurological:  Negative for dizziness, weakness and headaches.  Psychiatric/Behavioral:  The patient has insomnia.       Objective:  BP 134/84   Pulse 100   Temp 98.4 F (36.9 C) (Oral)   Wt 149 lb 3.2 oz (67.7 kg)   SpO2 97%   BMI 27.29 kg/m  BP Readings from Last 3 Encounters:  05/04/24 134/84  11/22/23 120/78  09/20/23 114/80   Wt Readings from Last 3 Encounters:  05/04/24 149 lb 3.2 oz (67.7 kg)  11/22/23 143 lb 11.2 oz (65.2 kg)  09/20/23 156 lb 11.2 oz (71.1 kg)      Physical Exam Vitals reviewed.  Constitutional:      General: She is not in acute distress.    Appearance: She is well-developed. She is not ill-appearing.  Eyes:     Pupils: Pupils are equal, round, and reactive to light.  Neck:      Thyroid : No thyromegaly.     Vascular: No JVD.  Cardiovascular:     Rate and Rhythm: Normal rate and regular rhythm.     Heart sounds:     No gallop.  Pulmonary:     Effort: Pulmonary effort is normal. No respiratory distress.     Breath sounds: Normal breath sounds. No wheezing or rales.  Musculoskeletal:     Cervical back: Neck supple.     Comments: She does have somewhat of a stooped posture especially bobbing her thoracic spine.  No localized spinal tenderness  Neurological:     Mental Status: She is alert.      No results found for any visits on 05/04/24.  {Labs (Optional):23779}  The 10-year ASCVD risk score (Arnett DK, et al., 2019) is: 2.3%    Assessment & Plan:   Problem List Items Addressed This Visit       Unprioritized   Hyperlipidemia - Primary   Relevant Orders   Lipid panel   CMP   Prediabetes   Relevant Orders   Hemoglobin A1c   Other Visit Diagnoses       History of iron  deficiency       Relevant Orders   CBC with Differential/Platelet   Iron , TIBC and Ferritin Panel     Restless leg syndrome       Relevant Orders   CBC with Differential/Platelet   Iron , TIBC and Ferritin Panel     Chronic midline thoracic back pain       Relevant Orders   DG Thoracic Spine 2 View     Osteopenia, unspecified location       Relevant Orders   DG Bone Density       No follow-ups on file.    Wolm Scarlet, MD

## 2024-05-05 ENCOUNTER — Inpatient Hospital Stay: Admission: RE | Admit: 2024-05-05

## 2024-05-05 ENCOUNTER — Ambulatory Visit: Payer: Self-pay | Admitting: Family Medicine

## 2024-05-05 ENCOUNTER — Other Ambulatory Visit

## 2024-05-06 MED ORDER — ROSUVASTATIN CALCIUM 10 MG PO TABS: 10.0000 mg | ORAL_TABLET | Freq: Every day | ORAL | 0 refills | Status: AC

## 2024-05-06 NOTE — Addendum Note (Signed)
 Addended by: METTA KRISTEN CROME on: 05/06/2024 01:29 PM   Modules accepted: Orders

## 2024-05-07 LAB — IRON,TIBC AND FERRITIN PANEL
%SAT: 11 % — ABNORMAL LOW (ref 16–45)
Ferritin: 9 ng/mL — ABNORMAL LOW (ref 16–232)
Iron: 40 ug/dL — ABNORMAL LOW (ref 45–160)
TIBC: 351 ug/dL (ref 250–450)

## 2024-05-08 MED ORDER — PANTOPRAZOLE SODIUM 40 MG PO TBEC: 40.0000 mg | DELAYED_RELEASE_TABLET | Freq: Every day | ORAL | 0 refills | Status: AC

## 2024-05-08 NOTE — Addendum Note (Signed)
 Addended by: METTA KRISTEN CROME on: 05/08/2024 08:53 AM   Modules accepted: Orders

## 2024-05-21 ENCOUNTER — Other Ambulatory Visit: Payer: Self-pay | Admitting: Psychiatry

## 2024-05-21 DIAGNOSIS — F4310 Post-traumatic stress disorder, unspecified: Secondary | ICD-10-CM

## 2024-05-21 DIAGNOSIS — F411 Generalized anxiety disorder: Secondary | ICD-10-CM

## 2024-05-22 ENCOUNTER — Encounter: Payer: Self-pay | Admitting: Family Medicine

## 2024-05-25 ENCOUNTER — Telehealth: Payer: Self-pay | Admitting: Psychiatry

## 2024-05-25 ENCOUNTER — Other Ambulatory Visit: Payer: Self-pay

## 2024-05-25 ENCOUNTER — Ambulatory Visit: Admitting: Psychiatry

## 2024-05-25 DIAGNOSIS — F4310 Post-traumatic stress disorder, unspecified: Secondary | ICD-10-CM

## 2024-05-25 DIAGNOSIS — G894 Chronic pain syndrome: Secondary | ICD-10-CM

## 2024-05-25 DIAGNOSIS — F411 Generalized anxiety disorder: Secondary | ICD-10-CM

## 2024-05-25 NOTE — Telephone Encounter (Signed)
"  Pended   "

## 2024-05-25 NOTE — Telephone Encounter (Signed)
 Patient called in for refill on Lyrica  150mg . Ph: [phone] [address] [facility] [place], [place]

## 2024-05-26 MED ORDER — ONDANSETRON HCL 8 MG PO TABS: 8.0000 mg | ORAL_TABLET | Freq: Three times a day (TID) | ORAL | 0 refills | Status: AC | PRN

## 2024-05-26 MED ORDER — PREGABALIN 150 MG PO CAPS: 150.0000 mg | ORAL_CAPSULE | Freq: Four times a day (QID) | ORAL | 0 refills | Status: DC

## 2024-05-26 NOTE — Telephone Encounter (Signed)
 Pt husband called expressing his frustration with Dr. Geoffry for not sending her medication yesterday before 5pm. He states Dr. Geoffry understands her situation and he thinks it was very reckless of him to ignore the req until today. Wanted to know if Dr. Geoffry was punishing her for not canceling her appt before 24 hrs when she was sick. States he was going to join appt wit her tomorrow but doesn't think its a good idea because he is pissed. Said we are dealing with people's mental health, not a broken arm and she's already had 2 brother to commit suicide.

## 2024-05-26 NOTE — Telephone Encounter (Signed)
 Pt lvm in tears about Lyrica  not being filled. Said she's been without it since Tuesday. Needs it filled ASAP

## 2024-05-26 NOTE — Telephone Encounter (Signed)
 RF request received 12/29 at 12:17, pended to Dr. Geoffry at 12:18.

## 2024-05-26 NOTE — Telephone Encounter (Signed)
 Pt and husband contacted [facility] who was on call last night. Report not being happy with Dr. Geoffry due to not sending in her Lyrica .  Scheduled tomorrow for apt 12/31

## 2024-05-27 ENCOUNTER — Encounter: Payer: Self-pay | Admitting: Psychiatry

## 2024-05-27 ENCOUNTER — Ambulatory Visit: Admitting: Psychiatry

## 2024-05-27 ENCOUNTER — Other Ambulatory Visit: Payer: Self-pay

## 2024-05-27 ENCOUNTER — Telehealth: Payer: Self-pay | Admitting: Psychiatry

## 2024-05-27 DIAGNOSIS — F39 Unspecified mood [affective] disorder: Secondary | ICD-10-CM

## 2024-05-27 DIAGNOSIS — F4310 Post-traumatic stress disorder, unspecified: Secondary | ICD-10-CM | POA: Diagnosis not present

## 2024-05-27 DIAGNOSIS — G894 Chronic pain syndrome: Secondary | ICD-10-CM | POA: Diagnosis not present

## 2024-05-27 DIAGNOSIS — F5105 Insomnia due to other mental disorder: Secondary | ICD-10-CM | POA: Diagnosis not present

## 2024-05-27 DIAGNOSIS — F902 Attention-deficit hyperactivity disorder, combined type: Secondary | ICD-10-CM

## 2024-05-27 DIAGNOSIS — F422 Mixed obsessional thoughts and acts: Secondary | ICD-10-CM

## 2024-05-27 DIAGNOSIS — F411 Generalized anxiety disorder: Secondary | ICD-10-CM | POA: Diagnosis not present

## 2024-05-27 MED ORDER — QUETIAPINE FUMARATE ER 400 MG PO TB24: 400.0000 mg | ORAL_TABLET | Freq: Two times a day (BID) | ORAL | 0 refills | Status: AC

## 2024-05-27 MED ORDER — PREGABALIN 150 MG PO CAPS: 150.0000 mg | ORAL_CAPSULE | Freq: Four times a day (QID) | ORAL | 2 refills | Status: AC

## 2024-05-27 MED ORDER — AMPHETAMINE-DEXTROAMPHETAMINE 30 MG PO TABS: 15.0000 mg | ORAL_TABLET | Freq: Two times a day (BID) | ORAL | 0 refills | Status: AC

## 2024-05-27 NOTE — Telephone Encounter (Signed)
 Pt coming today and husband just wanted a note in system that she needs a RF on the Adderal.

## 2024-05-27 NOTE — Telephone Encounter (Signed)
Pended RF.  
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
## 2024-06-01 ENCOUNTER — Ambulatory Visit: Payer: Self-pay | Admitting: Family Medicine

## 2024-06-02 ENCOUNTER — Ambulatory Visit (INDEPENDENT_AMBULATORY_CARE_PROVIDER_SITE_OTHER): Payer: Self-pay | Admitting: Family Medicine

## 2024-06-02 ENCOUNTER — Ambulatory Visit: Payer: Self-pay | Admitting: Family Medicine

## 2024-06-02 ENCOUNTER — Encounter: Payer: Self-pay | Admitting: Family Medicine

## 2024-06-02 VITALS — BP 134/80 | HR 91 | Temp 98.3°F | Wt 150.8 lb

## 2024-06-02 DIAGNOSIS — E785 Hyperlipidemia, unspecified: Secondary | ICD-10-CM

## 2024-06-02 DIAGNOSIS — M797 Fibromyalgia: Secondary | ICD-10-CM | POA: Diagnosis not present

## 2024-06-02 NOTE — Progress Notes (Signed)
 "  Established Patient Office Visit  Subjective   Patient ID: Toni Collins, female    DOB: 21-Dec-1965  Age: 59 y.o. MRN: [id number]  Chief Complaint  Patient presents with   Medical Management of Chronic Issues    HPI    Patient has chronic problems including history of kidney stones, ADD, chronic anxiety depression, fibromyalgia, hyperlipidemia, history of prediabetes, history of PTSD, chronic pain syndrome.  She has been followed for several years by psychiatrist and recently had difficulties with getting Lyrica  filled.  She missed one of her appointments because of some symptoms she was having with severe nausea had to reschedule.  Rescheduling was complicated by their office being closed over the holidays.  She eventually got her Lyrica  refilled.  She had unpleasant interaction with her psychiatry provider and she had contemplated potentially changing practices.  At this point, she is not in any imminent need of any of her medications.  She has hyperlipidemia we recently started rosuvastatin  10 mg daily.  Tolerating well with no significant myalgia.  Past Medical History:  Diagnosis Date   ADHD    Anemia    Anxiety    Arthritis    Colon polyps    DEPRESSION 09/02/2008   Fibromyalgia    GERD (gastroesophageal reflux disease)    GRIEF REACTION 09/30/2009   INSOMNIA, CHRONIC 09/02/2008   PREDIABETES 03/10/2009   PTSD (post-traumatic stress disorder)    Past Surgical History:  Procedure Laterality Date   ABDOMINAL HYSTERECTOMY  05/28/2000   TAH   COLONOSCOPY     DIAGNOSTIC LAPAROSCOPY  1993, 1994, 1998, 2000   x4   TMJ ARTHROPLASTY     x2   UPPER GASTROINTESTINAL ENDOSCOPY     UPPER GI ENDOSCOPY N/A 02/02/2022   Procedure: UPPER GI ENDOSCOPY;  Surgeon: Tanda Locus, MD;  Location: [facility];  Service: General;  Laterality: N/A;   XI ROBOTIC ASSISTED HIATAL HERNIA REPAIR N/A 02/02/2022   Procedure: XI ROBOTIC ASSISTED HIATAL HERNIA REPAIR WITH PARTAL WRAP, INSERTION OF  CHEST TUBE;  Surgeon: Tanda Locus, MD;  Location: [facility];  Service: General;  Laterality: N/A;    reports that she has never smoked. She has never used smokeless tobacco. She reports that she does not drink alcohol and does not use drugs. family history includes Arthritis in her mother; Colon polyps in her father and sister; Diabetes in her maternal aunt and maternal grandmother; Lung disease in her father. Allergies[1]  Review of Systems  Eyes:  Negative for blurred vision.  Respiratory:  Negative for shortness of breath.   Cardiovascular:  Negative for chest pain.  Neurological:  Negative for dizziness, weakness and headaches.      Objective:     BP 134/80   Pulse 91   Temp 98.3 F (36.8 C) (Oral)   Wt 150 lb 12.8 oz (68.4 kg)   SpO2 97%   BMI 27.58 kg/m  BP Readings from Last 3 Encounters:  06/02/24 134/80  05/04/24 134/84  11/22/23 120/78   Wt Readings from Last 3 Encounters:  06/02/24 150 lb 12.8 oz (68.4 kg)  05/04/24 149 lb 3.2 oz (67.7 kg)  11/22/23 143 lb 11.2 oz (65.2 kg)      Physical Exam Vitals reviewed.  Constitutional:      General: She is not in acute distress.    Appearance: She is not ill-appearing.  Cardiovascular:     Rate and Rhythm: Normal rate and regular rhythm.  Neurological:     Mental Status: She  is alert.      No results found for any visits on 06/02/24.  Last CBC Lab Results  Component Value Date   WBC 2.8 (L) 05/04/2024   HGB 10.6 (L) 05/04/2024   HCT 33.1 (L) 05/04/2024   MCV 77.6 (L) 05/04/2024   MCH 23.2 (L) 06/18/2022   RDW 16.6 (H) 05/04/2024   PLT 207.0 05/04/2024   Last metabolic panel Lab Results  Component Value Date   GLUCOSE 100 (H) 05/04/2024   NA 141 05/04/2024   K 4.0 05/04/2024   CL 108 05/04/2024   CO2 25 05/04/2024   BUN 12 05/04/2024   CREATININE 0.81 05/04/2024   GFR 81.37 05/04/2024   CALCIUM  9.0 05/04/2024   PHOS 3.2 02/03/2022   PROT 6.9 05/04/2024   ALBUMIN  3.9 05/04/2024   BILITOT 0.3  05/04/2024   ALKPHOS 82 05/04/2024   AST 16 05/04/2024   ALT 13 05/04/2024   ANIONGAP 14 06/18/2022   Last lipids Lab Results  Component Value Date   CHOL 228 (H) 05/04/2024   HDL 51.40 05/04/2024   LDLCALC 162 (H) 05/04/2024   LDLDIRECT 206.0 02/06/2013   TRIG 77.0 05/04/2024   CHOLHDL 4 05/04/2024   Last hemoglobin A1c Lab Results  Component Value Date   HGBA1C 5.8 05/04/2024      The 10-year ASCVD risk score (Arnett DK, et al., 2019) is: 2.9%    Assessment & Plan:   #1 fibromyalgia.  Patient on Lyrica  and getting through psychiatry.  She had recent difficulties getting this filled but currently does have supply with refills provided.  She had considered switching psychiatry practices but has not decided yet  #2 hyperlipidemia.  Recently initiated rosuvastatin  10 mg daily.  Continue low saturated fat diet.  Future lab order already placed for repeat lipid and hepatic panel in about 1 month  Wolm Scarlet, MD     [1] No Known Allergies  "

## 2024-06-10 ENCOUNTER — Encounter: Payer: Self-pay | Admitting: Family Medicine

## 2024-06-11 MED ORDER — OXYCODONE HCL 15 MG PO TABS: 15.0000 mg | ORAL_TABLET | Freq: Four times a day (QID) | ORAL | 0 refills | Status: AC | PRN

## 2024-06-11 NOTE — Telephone Encounter (Signed)
 It looks to me like diagnosis code was sent on prior rx, but I have sent this again with code G89.4  Wolm LELON Scarlet MD [facility]

## 2024-06-11 NOTE — Addendum Note (Signed)
 Addended by: MICHEAL WOLM ORN on: 06/11/2024 08:30 PM   Modules accepted: Orders

## 2024-06-12 ENCOUNTER — Encounter: Payer: Self-pay | Admitting: Family Medicine

## 2024-06-25 ENCOUNTER — Telehealth: Payer: Self-pay | Admitting: Psychiatry

## 2024-06-25 ENCOUNTER — Other Ambulatory Visit: Payer: Self-pay

## 2024-06-25 DIAGNOSIS — F902 Attention-deficit hyperactivity disorder, combined type: Secondary | ICD-10-CM

## 2024-06-25 MED ORDER — AMPHETAMINE-DEXTROAMPHETAMINE 30 MG PO TABS: 15.0000 mg | ORAL_TABLET | Freq: Two times a day (BID) | ORAL | 0 refills | Status: AC

## 2024-06-25 NOTE — Telephone Encounter (Signed)
 Pt called at 11:56a requesting refill of Adderall to  [id number] - [place], [place] - [address] [place] [address] AT [facility] [address] [place] [address], [place] [address] Phone: [phone]  Fax: [phone]   Next appt 2/4

## 2024-06-25 NOTE — Telephone Encounter (Signed)
Pended 1 RF.

## 2024-07-01 ENCOUNTER — Ambulatory Visit: Admitting: Psychiatry

## 2024-07-14 ENCOUNTER — Ambulatory Visit: Admitting: Family Medicine

## 2024-07-15 ENCOUNTER — Ambulatory Visit: Admitting: Family Medicine

## 2024-07-22 ENCOUNTER — Ambulatory Visit: Admitting: Psychiatry
